# Patient Record
Sex: Male | Born: 1945 | Race: White | Hispanic: Yes | Marital: Married | Smoking: Never smoker
Health system: Southern US, Community
[De-identification: ages and names within clinical notes are randomized; demographics above are authoritative.]

## PROBLEM LIST (undated history)

## (undated) DIAGNOSIS — I2510 Atherosclerotic heart disease of native coronary artery without angina pectoris: Secondary | ICD-10-CM

## (undated) DIAGNOSIS — I4891 Unspecified atrial fibrillation: Secondary | ICD-10-CM

## (undated) DIAGNOSIS — H269 Unspecified cataract: Secondary | ICD-10-CM

## (undated) DIAGNOSIS — I10 Essential (primary) hypertension: Secondary | ICD-10-CM

## (undated) DIAGNOSIS — I499 Cardiac arrhythmia, unspecified: Secondary | ICD-10-CM

## (undated) DIAGNOSIS — I428 Other cardiomyopathies: Secondary | ICD-10-CM

## (undated) DIAGNOSIS — J90 Pleural effusion, not elsewhere classified: Secondary | ICD-10-CM

## (undated) DIAGNOSIS — Z531 Procedure and treatment not carried out because of patient's decision for reasons of belief and group pressure: Secondary | ICD-10-CM

## (undated) DIAGNOSIS — E119 Type 2 diabetes mellitus without complications: Secondary | ICD-10-CM

## (undated) DIAGNOSIS — IMO0001 Reserved for inherently not codable concepts without codable children: Secondary | ICD-10-CM

## (undated) DIAGNOSIS — M1710 Unilateral primary osteoarthritis, unspecified knee: Secondary | ICD-10-CM

## (undated) DIAGNOSIS — M1990 Unspecified osteoarthritis, unspecified site: Secondary | ICD-10-CM

## (undated) DIAGNOSIS — Z950 Presence of cardiac pacemaker: Secondary | ICD-10-CM

## (undated) DIAGNOSIS — E7800 Pure hypercholesterolemia, unspecified: Secondary | ICD-10-CM

## (undated) HISTORY — DX: Essential (primary) hypertension: I10

## (undated) HISTORY — DX: Unspecified atrial fibrillation: I48.91

## (undated) HISTORY — DX: Pleural effusion, not elsewhere classified: J90

## (undated) HISTORY — DX: Unspecified cataract: H26.9

## (undated) HISTORY — DX: Unilateral primary osteoarthritis, unspecified knee: M17.10

## (undated) HISTORY — DX: Atherosclerotic heart disease of native coronary artery without angina pectoris: I25.10

## (undated) HISTORY — PX: CATARACT EXTRACTION W/ INTRAOCULAR LENS IMPLANT: SHX1309

## (undated) HISTORY — PX: HERNIA REPAIR: SHX51

## (undated) HISTORY — DX: Other cardiomyopathies: I42.8

---

## 1984-08-18 HISTORY — PX: INCISE AND DRAIN ABCESS: PRO64

## 1999-08-19 DIAGNOSIS — E119 Type 2 diabetes mellitus without complications: Secondary | ICD-10-CM

## 1999-08-19 HISTORY — DX: Type 2 diabetes mellitus without complications: E11.9

## 2004-08-18 DIAGNOSIS — I10 Essential (primary) hypertension: Secondary | ICD-10-CM

## 2004-08-18 HISTORY — DX: Essential (primary) hypertension: I10

## 2005-07-25 ENCOUNTER — Ambulatory Visit: Payer: Self-pay | Admitting: Nurse Practitioner

## 2005-08-04 ENCOUNTER — Ambulatory Visit: Payer: Self-pay | Admitting: *Deleted

## 2005-08-08 ENCOUNTER — Ambulatory Visit: Payer: Self-pay | Admitting: Nurse Practitioner

## 2005-09-17 ENCOUNTER — Ambulatory Visit: Payer: Self-pay | Admitting: Nurse Practitioner

## 2005-09-30 ENCOUNTER — Ambulatory Visit: Payer: Self-pay | Admitting: Nurse Practitioner

## 2005-11-25 ENCOUNTER — Ambulatory Visit: Payer: Self-pay | Admitting: Nurse Practitioner

## 2005-12-09 ENCOUNTER — Ambulatory Visit: Payer: Self-pay | Admitting: Nurse Practitioner

## 2005-12-23 ENCOUNTER — Ambulatory Visit: Payer: Self-pay | Admitting: Nurse Practitioner

## 2006-01-05 ENCOUNTER — Ambulatory Visit: Payer: Self-pay | Admitting: Nurse Practitioner

## 2006-01-21 ENCOUNTER — Ambulatory Visit: Payer: Self-pay | Admitting: Nurse Practitioner

## 2006-03-06 ENCOUNTER — Ambulatory Visit: Payer: Self-pay | Admitting: Nurse Practitioner

## 2006-06-05 ENCOUNTER — Ambulatory Visit: Payer: Self-pay | Admitting: Nurse Practitioner

## 2006-12-30 ENCOUNTER — Ambulatory Visit: Payer: Self-pay | Admitting: Nurse Practitioner

## 2007-02-03 ENCOUNTER — Ambulatory Visit: Payer: Self-pay | Admitting: Internal Medicine

## 2007-05-05 ENCOUNTER — Encounter (INDEPENDENT_AMBULATORY_CARE_PROVIDER_SITE_OTHER): Payer: Self-pay | Admitting: *Deleted

## 2007-07-27 ENCOUNTER — Ambulatory Visit: Payer: Self-pay | Admitting: Internal Medicine

## 2007-09-16 ENCOUNTER — Ambulatory Visit: Payer: Self-pay | Admitting: Internal Medicine

## 2007-10-13 ENCOUNTER — Encounter (INDEPENDENT_AMBULATORY_CARE_PROVIDER_SITE_OTHER): Payer: Self-pay | Admitting: Nurse Practitioner

## 2007-10-13 ENCOUNTER — Ambulatory Visit: Payer: Self-pay | Admitting: Internal Medicine

## 2007-10-13 LAB — CONVERTED CEMR LAB
ALT: 15 units/L (ref 0–53)
CO2: 28 meq/L (ref 19–32)
Calcium: 9.9 mg/dL (ref 8.4–10.5)
Chloride: 100 meq/L (ref 96–112)
Glucose, Bld: 83 mg/dL (ref 70–99)
Sodium: 138 meq/L (ref 135–145)
Total Protein: 7.8 g/dL (ref 6.0–8.3)

## 2008-02-25 ENCOUNTER — Ambulatory Visit: Payer: Self-pay | Admitting: Internal Medicine

## 2008-04-14 ENCOUNTER — Ambulatory Visit: Payer: Self-pay | Admitting: Internal Medicine

## 2008-07-25 ENCOUNTER — Ambulatory Visit: Payer: Self-pay | Admitting: Internal Medicine

## 2008-07-25 ENCOUNTER — Encounter (INDEPENDENT_AMBULATORY_CARE_PROVIDER_SITE_OTHER): Payer: Self-pay | Admitting: Internal Medicine

## 2008-07-25 LAB — CONVERTED CEMR LAB
Calcium: 9.6 mg/dL (ref 8.4–10.5)
Cholesterol: 171 mg/dL (ref 0–200)
Creatinine, Ser: 0.99 mg/dL (ref 0.40–1.50)
HDL: 48 mg/dL (ref >39)
Microalb, Ur: 0.38 mg/dL (ref 0.00–1.89)
Triglycerides: 56 mg/dL (ref <150)

## 2008-08-25 ENCOUNTER — Encounter (INDEPENDENT_AMBULATORY_CARE_PROVIDER_SITE_OTHER): Payer: Self-pay | Admitting: Internal Medicine

## 2008-08-25 ENCOUNTER — Ambulatory Visit: Payer: Self-pay | Admitting: Internal Medicine

## 2008-08-25 LAB — CONVERTED CEMR LAB
BUN: 22 mg/dL (ref 6–23)
CO2: 26 meq/L (ref 19–32)
Chloride: 103 meq/L (ref 96–112)
Creatinine, Ser: 0.94 mg/dL (ref 0.40–1.50)
Glucose, Bld: 90 mg/dL (ref 70–99)

## 2008-11-10 ENCOUNTER — Ambulatory Visit: Payer: Self-pay | Admitting: Internal Medicine

## 2009-01-08 ENCOUNTER — Ambulatory Visit: Payer: Self-pay | Admitting: Internal Medicine

## 2009-02-12 ENCOUNTER — Ambulatory Visit: Payer: Self-pay | Admitting: Internal Medicine

## 2009-07-16 ENCOUNTER — Ambulatory Visit: Payer: Self-pay | Admitting: Internal Medicine

## 2009-07-16 ENCOUNTER — Encounter (INDEPENDENT_AMBULATORY_CARE_PROVIDER_SITE_OTHER): Payer: Self-pay | Admitting: Internal Medicine

## 2009-07-16 LAB — CONVERTED CEMR LAB
ALT: 23 units/L (ref 0–53)
AST: 20 units/L (ref 0–37)
Albumin: 4.7 g/dL (ref 3.5–5.2)
Alkaline Phosphatase: 59 units/L (ref 39–117)
BUN: 23 mg/dL (ref 6–23)
CO2: 24 meq/L (ref 19–32)
Calcium: 9.4 mg/dL (ref 8.4–10.5)
Chloride: 105 meq/L (ref 96–112)
Creatinine, Ser: 0.94 mg/dL (ref 0.40–1.50)
Glucose, Bld: 102 mg/dL — ABNORMAL HIGH (ref 70–99)
Potassium: 4.3 meq/L (ref 3.5–5.3)
Sodium: 140 meq/L (ref 135–145)
Total Bilirubin: 0.8 mg/dL (ref 0.3–1.2)
Total Protein: 7.3 g/dL (ref 6.0–8.3)

## 2009-08-21 ENCOUNTER — Ambulatory Visit: Payer: Self-pay | Admitting: Internal Medicine

## 2009-11-19 ENCOUNTER — Ambulatory Visit: Payer: Self-pay | Admitting: Internal Medicine

## 2009-11-19 LAB — CONVERTED CEMR LAB
CO2: 22 meq/L (ref 19–32)
Chloride: 102 meq/L (ref 96–112)
Creatinine, Ser: 0.79 mg/dL (ref 0.40–1.50)

## 2009-12-11 ENCOUNTER — Ambulatory Visit: Payer: Self-pay | Admitting: Internal Medicine

## 2010-04-11 ENCOUNTER — Ambulatory Visit: Payer: Self-pay | Admitting: Internal Medicine

## 2010-09-16 ENCOUNTER — Encounter (INDEPENDENT_AMBULATORY_CARE_PROVIDER_SITE_OTHER): Payer: Self-pay | Admitting: *Deleted

## 2010-09-16 ENCOUNTER — Ambulatory Visit: Admit: 2010-09-16 | Payer: Self-pay | Admitting: Internal Medicine

## 2010-09-16 LAB — CONVERTED CEMR LAB
Cholesterol: 143 mg/dL (ref 0–200)
Total CHOL/HDL Ratio: 3.7
VLDL: 12 mg/dL (ref 0–40)

## 2011-12-17 ENCOUNTER — Encounter (INDEPENDENT_AMBULATORY_CARE_PROVIDER_SITE_OTHER): Payer: Self-pay | Admitting: General Surgery

## 2011-12-19 ENCOUNTER — Ambulatory Visit (INDEPENDENT_AMBULATORY_CARE_PROVIDER_SITE_OTHER): Payer: PRIVATE HEALTH INSURANCE | Admitting: General Surgery

## 2011-12-19 ENCOUNTER — Encounter (INDEPENDENT_AMBULATORY_CARE_PROVIDER_SITE_OTHER): Payer: Self-pay | Admitting: General Surgery

## 2011-12-19 VITALS — BP 138/82 | HR 66 | Temp 97.9°F | Resp 14 | Ht 74.0 in | Wt 246.2 lb

## 2011-12-19 DIAGNOSIS — K4030 Unilateral inguinal hernia, with obstruction, without gangrene, not specified as recurrent: Secondary | ICD-10-CM

## 2011-12-19 DIAGNOSIS — K4090 Unilateral inguinal hernia, without obstruction or gangrene, not specified as recurrent: Secondary | ICD-10-CM | POA: Insufficient documentation

## 2011-12-19 DIAGNOSIS — K429 Umbilical hernia without obstruction or gangrene: Secondary | ICD-10-CM | POA: Insufficient documentation

## 2011-12-19 MED ORDER — TAMSULOSIN HCL 0.4 MG PO CAPS: 0.4000 mg | ORAL_CAPSULE | Freq: Every day | ORAL | Status: DC

## 2011-12-19 NOTE — Progress Notes (Signed)
Patient ID: Kyle Burke, male   DOB: August 12, 1946, 66 y.o.   MRN: [id number]  Chief Complaint  Patient presents with  . Inguinal Hernia    new pt- eval ing hernia    HPI Kyle Burke is a 66 y.o. male.  HPI 66 year old Hispanic male referred by Dr. Andrey Campanile for evaluation of an incarcerated left inguinal hernia. The patient states that he has had a bulge in his groin for about 6-7 months. It causes him minimal discomfort. When it does cause him discomfort it radiates into his left testicle. He denies any numbness or tingling. He denies any fever, chills, nausea, vomiting, diarrhea, or constipation. He denies any dysuria. He does have some nocturia. He denies any prior abdominal surgery. He denies any weight loss.  His son functioned as his Nurse, learning disability Past Medical History  Diagnosis Date  . Hypertension   . Diabetes mellitus     Past Surgical History  Procedure Date  . Incise and drain abcess 1986    on leg    Family History  Problem Relation Age of Onset  . Stroke Mother   . Heart disease Father     Social History History  Substance Use Topics  . Smoking status: Never Smoker   . Smokeless tobacco: Not on file  . Alcohol Use: Yes    No Known Allergies  Current Outpatient Prescriptions  Medication Sig Dispense Refill  . amLODipine (NORVASC) 5 MG tablet Take 5 mg by mouth daily.      Marland Kitchen lisinopril-hydrochlorothiazide (PRINZIDE,ZESTORETIC) 20-12.5 MG per tablet Take 1 tablet by mouth daily.      . metFORMIN (GLUCOPHAGE) 500 MG tablet Take 500 mg by mouth 2 (two) times daily with a meal.      . Tamsulosin HCl (FLOMAX) 0.4 MG CAPS Take 1 capsule (0.4 mg total) by mouth daily. Start taking 2 days before your hernia surgery  7 capsule  0    Review of Systems Review of Systems  Constitutional: Negative for fever, chills, appetite change and unexpected weight change.  HENT: Negative for congestion and trouble swallowing.   Eyes: Negative for visual disturbance.    Respiratory: Negative for chest tightness and shortness of breath.   Cardiovascular: Negative for chest pain and leg swelling.       No PND, no orthopnea, no DOE  Gastrointestinal:       See HPI  Genitourinary: Negative for dysuria and hematuria.       +nocturia  Musculoskeletal: Negative.   Skin: Negative for rash.  Neurological: Negative for seizures and speech difficulty.  Hematological: Does not bruise/bleed easily.  Psychiatric/Behavioral: Negative for behavioral problems and confusion.    Blood pressure 138/82, pulse 66, temperature 97.9 F (36.6 C), temperature source Temporal, resp. rate 14, height 6\' 2"  (1.88 m), weight 246 lb 3.2 oz (111.676 kg).  Physical Exam Physical Exam  Vitals reviewed. Constitutional: He is oriented to person, place, and time. He appears well-developed and well-nourished. No distress.  HENT:  Head: Normocephalic and atraumatic.  Right Ear: External ear normal.  Left Ear: External ear normal.  Eyes: Conjunctivae are normal. No scleral icterus.  Neck: Normal range of motion. Neck supple. No tracheal deviation present. No thyromegaly present.  Cardiovascular: Normal rate, regular rhythm, normal heart sounds and intact distal pulses.   Pulmonary/Chest: Effort normal and breath sounds normal. No respiratory distress. He has no wheezes.  Abdominal: Soft. Bowel sounds are normal. He exhibits no distension. There is no tenderness. There is no rebound. A hernia  is present. Hernia confirmed positive in the ventral area and confirmed positive in the left inguinal area.       1cm umbilical hernia, reducible  Genitourinary: Testes normal.          Large left incarcerated nontender scrotal/inguinal hernia  Musculoskeletal: Normal range of motion. He exhibits no edema and no tenderness.  Lymphadenopathy:    He has no cervical adenopathy.  Neurological: He is alert and oriented to person, place, and time. He exhibits normal muscle tone.  Skin: Skin is warm  and dry. No rash noted. He is not diaphoretic. No erythema.  Psychiatric: He has a normal mood and affect. His behavior is normal. Thought content normal.    Data Reviewed [facility] note  Assessment    Umbilical hernia Large left incarcerated (nontender) inguinal hernia    Plan    We discussed the etiology of umbilical and inguinal hernias. We discussed the signs & symptoms of incarceration & strangulation.  We discussed non-operative and operative management. Since He has both an umbilical and inguinal hernia I recommended a laparoscopic approach  The patient has elected to proceed with LAPAROSCOPIC LEFT INGUINAL HERNIA REPAIR WITH MESH, UMBILICAL HERNIA REPAIR WITH POSSIBLE MESH    I described the procedure in detail.  The patient was given educational material. We discussed the risks and benefits including but not limited to bleeding, infection, chronic inguinal pain, nerve entrapment, hernia recurrence, mesh complications, hematoma formation, urinary retention, injury to the testicles or the ovaries, numbness in the groin, blood clots, injury to the surrounding structures, and anesthesia risk. We also discussed the typical post operative recovery course, including no heavy lifting for 4-6 weeks. I explained that the likelihood of improvement of their symptoms is good.  I explained that he is at slightly higher risk of hematoma and nerve injury given the size of his hernia.   Kyle Burke. Andrey Campanile, MD, FACS General, Bariatric, & Minimally Invasive Surgery [facility], [place]         [facility] M 12/19/2011, 10:32 AM

## 2011-12-19 NOTE — Patient Instructions (Signed)
Hernia, reparacin con laparoscopa (Hernia Repair with Laparoscope) Una hernia ocurre cuando un rgano interno protruye a travs de un punto dbil de los msculos de la pared muscular abdominal (del vientre). Se producen con mayor frecuencia en la ingle y alrededor del ombligo. Tambin pueden producirse a travs de una incisin (corte realizado por el cirujano) despus de una operacin abdominal. Las causas de hernia son:  Levantar objetos pesados.   Tos prolongada.   Hacer fuerza para mover el intestino.  Generalmente puede volver a colocarse en su lugar (reducirse). La mayor parte de las hernias tienden a empeorar con el tiempo. Se producen algunos problemas cuando alguna parte del contenido abdominal se atasca en la abertura y el flujo de sangre se bloquea o se ve dificultado (hernia encarcelada). Debido a estos riesgos, es necesario someterse a una ciruga para reparar la hernia. La hernia se reparar utilizando un laparoscopio La ciruga laparoscpica es un tipo de ciruga mnimamente invasora. No implica ningn corte quirrgico (incisin) en la piel. El laparoscopio es un dispositivo similar a un telescopio que utiliza varillas y lentes. Se conecta a una cmara de vdeo y a una fuente de luz, con la que el profesional puede ver claramente la zona de la operacin. El instrumento se inserta por una abertura en la piel de  to  inch (5 mm or 10 mm), en ciertos puntos especficos. Se crea un espacio de visualizacin y manipulacin, insuflando una pequea cantidad de dixido de carbono en la cavidad abdominal. El abdomen es inflado (insuflado) como un globo. Esto eleve la pared abdominal por arriba de los rganos internos como si fuera una bveda. El dixido de carbono es un gas presente en el organismo humano y puede ser absorbido por los tejidos y eliminado a travs del sistema respiratorio. Una vez que se ha llevado a cabo la reparacin, las pequeas incisiones se cerrarn, ya sea con suturas  (puntos) o grapas (igual a las que se usan para el papel, pero aqu se trata de mantener los bordes de la piel unidos). INFORME AL PROFESIONAL SOBRE LOS SIGUIENTES PUNTOS:  Alergias.   Medicamentos que toma, incluyendo hierbas, gotas oftlmicas, medicamentos de venta libre y cremas.   Uso de esteroides (por va oral o cremas).   Problemas anteriores debido a anestsicos o a la [place].   Posibilidad de embarazo, si correspondiera.   Antecedentes de haber tenido cogulos sanguneos (tromboflebitis).   Antecedentes de hemorragias o problemas sanguneos.   Cirugas previas.   Otros problemas de salud.  ANTES DEL PROCEDIMIENTO La laparoscopa podr llevarse a cabo tanto en el hospital como en un consultorio. Podrn administrarle sedante suave que lo ayudar a relajarse antes y durante el procedimiento. Una vez en la sala de operaciones, le darn una anestesia general que lo har dormir (a menos que usted y el profesional elijan otro tipo de anestesia).  DESPUS DEL PROCEDIMIENTO Luego del procedimiento ser llevado a una sala de recuperacin. Segn el tipo de hernia que se haya reparado, deber quedarse en el hospital o se le permitir volver a casa el mismo da. Con este procedimiento sentir menos dolor y habr una menor cicatrizacin. Esto por lo general resulta en una recuperacin ms rpida y un menor riesgo de infeccin. INSTRUCCIONES PARA EL CUIDADO DOMICILIARIO  No es necesario hacer reposo en cama. Puede continuar con sus actividades normales pero evite levantar objetos pesados (ms de 5 kg) y el estreimiento.   Tosa con suavidad. Si es fumador, es mejor abandonar el hbito, ya   que an despus de reparada, la hernia puede volver a aparecer con la tensin continua de la tos.   Evite conducir hasta que el cirujano lo autorice.   No hay restricciones en la dieta, a menos que le indiquen otra cosa.   TOME TODOS LOS MEDICAMENTOS TAL COMO SE LE INDIC.   Utilice los medicamentos  de venta libre o de prescripcin para el dolor, el malestar o la fiebre, segn se lo indique el profesional que lo asiste.  SOLICITE ATENCIN MDICA SI:  Aumenta el dolor abdominal o en las incisiones.   La hemorragia en las incisiones es ms que una pequea mancha.   Si se siente confundido o muy dbil.   Siente escalofros o la temperatura oral se eleva por encima de 102 F (38.9 C) en forma inexplicable.   Presenta enrojecimiento, hinchazn o aumento del dolor en la herida.   Aparece pus en la herida.   Un olor ftido proviene de la herida o del vendaje.  SOLICITE ATENCIN MDICA DE INMEDIATO SI:  Aparece una erupcin cutnea.   Presenta dificultades respiratorias.   Tiene algn problema alrgico.  EST SEGURO QUE:   Comprende las instrucciones para el alta mdica.   Controlar su enfermedad.   Solicitar atencin mdica de inmediato segn las indicaciones.  Document Released: 08/04/2005 Document Revised: 07/24/2011 [employer] Patient Information 2012 [employer]. 

## 2012-01-13 ENCOUNTER — Encounter (HOSPITAL_COMMUNITY)
Admission: RE | Admit: 2012-01-13 | Discharge: 2012-01-13 | Disposition: A | Discharge status: Home / Self Care | Payer: Self-pay | Source: Ambulatory Visit | Attending: General Surgery | Admitting: General Surgery

## 2012-01-13 ENCOUNTER — Ambulatory Visit (HOSPITAL_COMMUNITY)
Admission: RE | Admit: 2012-01-13 | Discharge: 2012-01-13 | Disposition: A | Discharge status: Home / Self Care | Payer: Self-pay | Source: Ambulatory Visit | Attending: General Surgery | Admitting: General Surgery

## 2012-01-13 ENCOUNTER — Encounter (HOSPITAL_COMMUNITY): Payer: Self-pay

## 2012-01-13 ENCOUNTER — Encounter (HOSPITAL_COMMUNITY): Payer: Self-pay | Admitting: Pharmacy Technician

## 2012-01-13 DIAGNOSIS — Z01812 Encounter for preprocedural laboratory examination: Secondary | ICD-10-CM | POA: Insufficient documentation

## 2012-01-13 DIAGNOSIS — K4090 Unilateral inguinal hernia, without obstruction or gangrene, not specified as recurrent: Secondary | ICD-10-CM | POA: Insufficient documentation

## 2012-01-13 DIAGNOSIS — Z01818 Encounter for other preprocedural examination: Secondary | ICD-10-CM | POA: Insufficient documentation

## 2012-01-13 LAB — SURGICAL PCR SCREEN: MRSA, PCR: NEGATIVE

## 2012-01-13 LAB — DIFFERENTIAL
Basophils Absolute: 0 10*3/uL (ref 0.0–0.1)
Basophils Relative: 0 % (ref 0–1)
Eosinophils Absolute: 0.1 10*3/uL (ref 0.0–0.7)
Eosinophils Relative: 1 % (ref 0–5)
Monocytes Absolute: 0.5 10*3/uL (ref 0.1–1.0)
Neutro Abs: 4.2 10*3/uL (ref 1.7–7.7)

## 2012-01-13 LAB — BASIC METABOLIC PANEL
BUN: 23 mg/dL (ref 6–23)
Chloride: 102 mEq/L (ref 96–112)
Creatinine, Ser: 0.91 mg/dL (ref 0.50–1.35)
GFR calc Af Amer: >90 mL/min (ref >90)

## 2012-01-13 LAB — CBC
HCT: 39.6 % (ref 39.0–52.0)
MCHC: 33.6 g/dL (ref 30.0–36.0)
MCV: 89.4 fL (ref 78.0–100.0)
RDW: 13.4 % (ref 11.5–15.5)

## 2012-01-13 NOTE — Patient Instructions (Addendum)
21 Kyle Burke  01/13/2012   Your procedure is scheduled on:  5-31 -2013  Report to [facility] at    0715    AM.  Call this number if you have problems the morning of surgery: [phone]   Remember:   Do not eat food:After Midnight.    Take these medicines the morning of surgery with A SIP OF WATER: Amlodipine, Do not take any Metformin AM of Surgery.   Do not wear jewelry, make-up or nail polish.  Do not wear lotions, powders, or perfumes. You may wear deodorant.  Do not shave 48 hours prior to surgery.(face and neck okay, no shaving of legs)  Do not bring valuables to the hospital.  Contacts, dentures or bridgework may not be worn into surgery.  Leave suitcase in the car. After surgery it may be brought to your room.  For patients admitted to the hospital, checkout time is 11:00 AM the day of discharge.   Patients discharged the day of surgery will not be allowed to drive home.  Name and phone number of your driver: Son -[name]  Special Instructions: CHG Shower Use Special Wash: 1/2 bottle night before surgery and 1/2 bottle morning of surgery.(avoid face and genitals)   Please read over the following fact sheets that you were given: MRSA Information.

## 2012-01-13 NOTE — Pre-Procedure Instructions (Signed)
01-13-12 EKG/CXR done today.

## 2012-01-16 ENCOUNTER — Encounter (HOSPITAL_COMMUNITY): Payer: Self-pay | Admitting: Anesthesiology

## 2012-01-16 ENCOUNTER — Encounter (HOSPITAL_COMMUNITY)
Admission: RE | Disposition: A | Discharge status: Home / Self Care | Payer: Self-pay | Source: Ambulatory Visit | Attending: General Surgery

## 2012-01-16 ENCOUNTER — Ambulatory Visit (HOSPITAL_COMMUNITY)
Admission: RE | Admit: 2012-01-16 | Discharge: 2012-01-16 | Disposition: A | Discharge status: Home / Self Care | Payer: Self-pay | Source: Ambulatory Visit | Attending: General Surgery | Admitting: General Surgery

## 2012-01-16 ENCOUNTER — Ambulatory Visit (HOSPITAL_COMMUNITY): Payer: Self-pay | Admitting: Anesthesiology

## 2012-01-16 ENCOUNTER — Encounter (HOSPITAL_COMMUNITY): Payer: Self-pay | Admitting: *Deleted

## 2012-01-16 DIAGNOSIS — I10 Essential (primary) hypertension: Secondary | ICD-10-CM | POA: Insufficient documentation

## 2012-01-16 DIAGNOSIS — K429 Umbilical hernia without obstruction or gangrene: Secondary | ICD-10-CM | POA: Insufficient documentation

## 2012-01-16 DIAGNOSIS — K4030 Unilateral inguinal hernia, with obstruction, without gangrene, not specified as recurrent: Secondary | ICD-10-CM

## 2012-01-16 DIAGNOSIS — Z79899 Other long term (current) drug therapy: Secondary | ICD-10-CM | POA: Insufficient documentation

## 2012-01-16 DIAGNOSIS — E119 Type 2 diabetes mellitus without complications: Secondary | ICD-10-CM | POA: Insufficient documentation

## 2012-01-16 HISTORY — PX: INGUINAL HERNIA REPAIR: SHX194

## 2012-01-16 HISTORY — PX: UMBILICAL HERNIA REPAIR: SHX196

## 2012-01-16 LAB — GLUCOSE, CAPILLARY: Glucose-Capillary: 126 mg/dL — ABNORMAL HIGH (ref 70–99)

## 2012-01-16 SURGERY — REPAIR, HERNIA, INGUINAL, LAPAROSCOPIC
Anesthesia: General | Wound class: Clean

## 2012-01-16 MED ORDER — PROPOFOL 10 MG/ML IV BOLUS
INTRAVENOUS | Status: DC | PRN
  Administered 2012-01-16: 200 mg via INTRAVENOUS

## 2012-01-16 MED ORDER — BUPIVACAINE-EPINEPHRINE 0.25% -1:200000 IJ SOLN
INTRAMUSCULAR | Status: DC | PRN
  Administered 2012-01-16: 30 mL

## 2012-01-16 MED ORDER — OXYCODONE-ACETAMINOPHEN 5-325 MG PO TABS: 1.0000 | ORAL_TABLET | ORAL | Status: AC | PRN

## 2012-01-16 MED ORDER — MIDAZOLAM HCL 5 MG/5ML IJ SOLN
INTRAMUSCULAR | Status: DC | PRN
  Administered 2012-01-16: 2 mg via INTRAVENOUS

## 2012-01-16 MED ORDER — LIDOCAINE HCL (CARDIAC) 20 MG/ML IV SOLN
INTRAVENOUS | Status: DC | PRN
  Administered 2012-01-16: 100 mg via INTRAVENOUS

## 2012-01-16 MED ORDER — HYDROMORPHONE HCL PF 1 MG/ML IJ SOLN
INTRAMUSCULAR | Status: AC
  Filled 2012-01-16: qty 1

## 2012-01-16 MED ORDER — CHLORHEXIDINE GLUCONATE 4 % EX LIQD
1.0000 "application " | Freq: Once | CUTANEOUS | Status: DC
  Filled 2012-01-16: qty 15

## 2012-01-16 MED ORDER — FENTANYL CITRATE 0.05 MG/ML IJ SOLN
INTRAMUSCULAR | Status: DC | PRN
  Administered 2012-01-16 (×2): 50 ug via INTRAVENOUS
  Administered 2012-01-16: 100 ug via INTRAVENOUS
  Administered 2012-01-16: 50 ug via INTRAVENOUS

## 2012-01-16 MED ORDER — LACTATED RINGERS IV SOLN
INTRAVENOUS | Status: DC
  Administered 2012-01-16 (×2): 1000 mL via INTRAVENOUS

## 2012-01-16 MED ORDER — ACETAMINOPHEN 10 MG/ML IV SOLN
INTRAVENOUS | Status: DC | PRN
  Administered 2012-01-16: 1000 mg via INTRAVENOUS

## 2012-01-16 MED ORDER — CEFAZOLIN SODIUM-DEXTROSE 2-3 GM-% IV SOLR
INTRAVENOUS | Status: AC
  Filled 2012-01-16: qty 50

## 2012-01-16 MED ORDER — EPHEDRINE SULFATE 50 MG/ML IJ SOLN
INTRAMUSCULAR | Status: DC | PRN
  Administered 2012-01-16: 5 mg via INTRAVENOUS

## 2012-01-16 MED ORDER — BUPIVACAINE-EPINEPHRINE PF 0.25-1:200000 % IJ SOLN
INTRAMUSCULAR | Status: AC
  Filled 2012-01-16: qty 30

## 2012-01-16 MED ORDER — ACETAMINOPHEN 10 MG/ML IV SOLN
INTRAVENOUS | Status: AC
  Filled 2012-01-16: qty 100

## 2012-01-16 MED ORDER — LACTATED RINGERS IR SOLN
Status: DC | PRN
  Administered 2012-01-16: 1

## 2012-01-16 MED ORDER — PROMETHAZINE HCL 25 MG/ML IJ SOLN: 6.2500 mg | INTRAMUSCULAR | Status: DC | PRN

## 2012-01-16 MED ORDER — CEFAZOLIN SODIUM-DEXTROSE 2-3 GM-% IV SOLR
2.0000 g | INTRAVENOUS | Status: AC
  Administered 2012-01-16: 2 g via INTRAVENOUS

## 2012-01-16 MED ORDER — HYDROMORPHONE HCL PF 1 MG/ML IJ SOLN
0.2500 mg | INTRAMUSCULAR | Status: DC | PRN
  Administered 2012-01-16 (×2): 0.5 mg via INTRAVENOUS

## 2012-01-16 MED ORDER — 0.9 % SODIUM CHLORIDE (POUR BTL) OPTIME
TOPICAL | Status: DC | PRN
  Administered 2012-01-16: 1000 mL

## 2012-01-16 MED ORDER — NEOSTIGMINE METHYLSULFATE 1 MG/ML IJ SOLN
INTRAMUSCULAR | Status: DC | PRN
  Administered 2012-01-16: 4 mg via INTRAVENOUS

## 2012-01-16 MED ORDER — ONDANSETRON HCL 4 MG/2ML IJ SOLN
INTRAMUSCULAR | Status: DC | PRN
  Administered 2012-01-16: 4 mg via INTRAVENOUS

## 2012-01-16 MED ORDER — GLYCOPYRROLATE 0.2 MG/ML IJ SOLN
INTRAMUSCULAR | Status: DC | PRN
  Administered 2012-01-16: .8 mg via INTRAVENOUS

## 2012-01-16 MED ORDER — ROCURONIUM BROMIDE 100 MG/10ML IV SOLN
INTRAVENOUS | Status: DC | PRN
  Administered 2012-01-16: 10 mg via INTRAVENOUS
  Administered 2012-01-16: 50 mg via INTRAVENOUS
  Administered 2012-01-16 (×3): 10 mg via INTRAVENOUS

## 2012-01-16 SURGICAL SUPPLY — 61 items
ADH SKN CLS APL DERMABOND .7 (GAUZE/BANDAGES/DRESSINGS) ×2
APL SKNCLS STERI-STRIP NONHPOA (GAUZE/BANDAGES/DRESSINGS)
BANDAGE ADHESIVE 1X3 (GAUZE/BANDAGES/DRESSINGS) IMPLANT
BENZOIN TINCTURE PRP APPL 2/3 (GAUZE/BANDAGES/DRESSINGS) IMPLANT
BLADE HEX COATED 2.75 (ELECTRODE) ×1 IMPLANT
BLADE SURG 15 STRL LF DISP TIS (BLADE) IMPLANT
BLADE SURG 15 STRL SS (BLADE)
BLADE SURG SZ10 CARB STEEL (BLADE) ×1 IMPLANT
CANISTER SUCTION 2500CC (MISCELLANEOUS) ×3 IMPLANT
CHLORAPREP W/TINT 26ML (MISCELLANEOUS) ×2 IMPLANT
CLOTH BEACON ORANGE TIMEOUT ST (SAFETY) ×3 IMPLANT
DECANTER SPIKE VIAL GLASS SM (MISCELLANEOUS) ×2 IMPLANT
DERMABOND ADVANCED (GAUZE/BANDAGES/DRESSINGS) ×1
DERMABOND ADVANCED .7 DNX12 (GAUZE/BANDAGES/DRESSINGS) ×1 IMPLANT
DEVICE SECURE STRAP 25 ABSORB (INSTRUMENTS) ×3 IMPLANT
DRAIN PENROSE 18X1/2 LTX STRL (DRAIN) ×1 IMPLANT
DRAPE LAPAROSCOPIC ABDOMINAL (DRAPES) ×3 IMPLANT
DRAPE UTILITY XL STRL (DRAPES) ×3 IMPLANT
DRSG TEGADERM 2-3/8X2-3/4 SM (GAUZE/BANDAGES/DRESSINGS) IMPLANT
DRSG TEGADERM 4X4.75 (GAUZE/BANDAGES/DRESSINGS) IMPLANT
ELECT REM PT RETURN 9FT ADLT (ELECTROSURGICAL) ×3
ELECTRODE REM PT RTRN 9FT ADLT (ELECTROSURGICAL) ×2 IMPLANT
GLOVE BIO SURGEON STRL SZ7 (GLOVE) ×3 IMPLANT
GLOVE BIO SURGEON STRL SZ7.5 (GLOVE) ×3 IMPLANT
GLOVE BIOGEL M STRL SZ7.5 (GLOVE) IMPLANT
GLOVE BIOGEL PI IND STRL 7.0 (GLOVE) ×2 IMPLANT
GLOVE BIOGEL PI INDICATOR 7.0 (GLOVE) ×1
GLOVE INDICATOR 8.0 STRL GRN (GLOVE) ×4 IMPLANT
GOWN STRL NON-REIN LRG LVL3 (GOWN DISPOSABLE) ×1 IMPLANT
GOWN STRL REIN XL XLG (GOWN DISPOSABLE) ×5 IMPLANT
KIT BASIN OR (CUSTOM PROCEDURE TRAY) ×3 IMPLANT
MESH 3DMAX 4X6 LT LRG (Mesh General) ×1 IMPLANT
NDL HYPO 25X1 1.5 SAFETY (NEEDLE) ×1 IMPLANT
NEEDLE HYPO 25X1 1.5 SAFETY (NEEDLE) IMPLANT
NS IRRIG 1000ML POUR BTL (IV SOLUTION) ×3 IMPLANT
PACK BASIC VI WITH GOWN DISP (CUSTOM PROCEDURE TRAY) ×3 IMPLANT
PENCIL BUTTON HOLSTER BLD 10FT (ELECTRODE) ×3 IMPLANT
SCISSORS LAP 5X35 DISP (ENDOMECHANICALS) ×1 IMPLANT
SET IRRIG TUBING LAPAROSCOPIC (IRRIGATION / IRRIGATOR) ×2 IMPLANT
SHEARS CURVED HARMONIC AC 45CM (MISCELLANEOUS) IMPLANT
SOLUTION ANTI FOG 6CC (MISCELLANEOUS) ×3 IMPLANT
SPONGE GAUZE 4X4 12PLY (GAUZE/BANDAGES/DRESSINGS) IMPLANT
SPONGE LAP 18X18 X RAY DECT (DISPOSABLE) ×3 IMPLANT
STRIP CLOSURE SKIN 1/2X4 (GAUZE/BANDAGES/DRESSINGS) IMPLANT
SUT MNCRL AB 4-0 PS2 18 (SUTURE) ×3 IMPLANT
SUT NOVA NAB GS-21 0 18 T12 DT (SUTURE) ×1 IMPLANT
SUT SILK 2 0 SH (SUTURE) IMPLANT
SUT VIC AB 2-0 SH 27 (SUTURE)
SUT VIC AB 2-0 SH 27X BRD (SUTURE) ×4 IMPLANT
SUT VIC AB 3-0 SH 18 (SUTURE) ×1 IMPLANT
SUT VIC AB 3-0 SH 27 (SUTURE)
SUT VIC AB 3-0 SH 27XBRD (SUTURE) ×1 IMPLANT
SYR BULB IRRIGATION 50ML (SYRINGE) ×2 IMPLANT
SYR CONTROL 10ML LL (SYRINGE) ×2 IMPLANT
TOWEL OR 17X26 10 PK STRL BLUE (TOWEL DISPOSABLE) ×2 IMPLANT
TRAY FOLEY CATH 14FRSI W/METER (CATHETERS) ×1 IMPLANT
TRAY LAP CHOLE (CUSTOM PROCEDURE TRAY) ×3 IMPLANT
TROCAR BLADELESS OPT 5 75 (ENDOMECHANICALS) ×6 IMPLANT
TROCAR XCEL BLUNT TIP 100MML (ENDOMECHANICALS) ×3 IMPLANT
TUBING INSUFFLATION 10FT LAP (TUBING) ×3 IMPLANT
YANKAUER SUCT BULB TIP 10FT TU (MISCELLANEOUS) ×1 IMPLANT

## 2012-01-16 NOTE — Progress Notes (Signed)
Interpreter in to help with translation and assessment of pt; pt comfortable at present

## 2012-01-16 NOTE — Anesthesia Postprocedure Evaluation (Signed)
  Anesthesia Post-op Note  Patient: Kyle Burke  Procedure(s) Performed: Procedure(s) (LRB): LAPAROSCOPIC INGUINAL HERNIA (Left) HERNIA REPAIR UMBILICAL ADULT (N/A) INSERTION OF MESH (N/A)  Patient Location: PACU  Anesthesia Type: General  Level of Consciousness: awake and alert   Airway and Oxygen Therapy: Patient Spontanous Breathing  Post-op Pain: mild  Post-op Assessment: Post-op Vital signs reviewed, Patient's Cardiovascular Status Stable, Respiratory Function Stable, Patent Airway and No signs of Nausea or vomiting  Post-op Vital Signs: stable  Complications: No apparent anesthesia complications

## 2012-01-16 NOTE — Discharge Instructions (Signed)
[facility]  UMBILICAL OR INGUINAL HERNIA REPAIR: POST OP INSTRUCTIONS  Always review your discharge instruction sheet given to you by the facility where your surgery was performed. IF YOU HAVE DISABILITY OR FAMILY LEAVE FORMS, YOU MUST BRING THEM TO THE OFFICE FOR PROCESSING.   DO NOT GIVE THEM TO YOUR DOCTOR.  1. A  prescription for pain medication may be given to you upon discharge.  Take your pain medication as prescribed, if needed.  If narcotic pain medicine is not needed, then you may take acetaminophen (Tylenol) or ibuprofen (Advil) as needed. 2. Take your usually prescribed medications unless otherwise directed. 3. If you need a refill on your pain medication, please contact your pharmacy.  They will contact our office to request authorization. Prescriptions will not be filled after 5 pm or on week-ends. 4. You should follow a light diet the first 24 hours after arrival home, such as soup and crackers, etc.  Be sure to include lots of fluids daily.  Resume your normal diet the day after surgery. 5. Most patients will experience some swelling and bruising around the umbilicus or in the groin and scrotum.  Ice packs and reclining will help.  Swelling and bruising can take several days to resolve.  6. It is common to experience some constipation if taking pain medication after surgery.  Increasing fluid intake and taking a stool softener (such as Colace) will usually help or prevent this problem from occurring.  A mild laxative (Milk of Magnesia or Miralax) should be taken according to package directions if there are no bowel movements after 48 hours. 7. Unless discharge instructions indicate otherwise, you may remove your bandages 24 hours after surgery, and you may shower at that time.  You have skin glue on the incision, you may shower in 24 hours.  The glue will flake off over the next 2-3 weeks.  8. ACTIVITIES:  You may resume regular (light) daily activities beginning  the next day--such as daily self-care, walking, climbing stairs--gradually increasing activities as tolerated.  You may have sexual intercourse when it is comfortable.  Refrain from any heavy lifting or straining until approved by your doctor. a. You may drive when you are no longer taking prescription pain medication, you can comfortably wear a seatbelt, and you can safely maneuver your car and apply brakes. b. RETURN TO WORK: 9. You should see your doctor in the office for a follow-up appointment approximately 2-3 weeks after your surgery.  Make sure that you call for this appointment within a day or two after you arrive home to insure a convenient appointment time. 10. OTHER INSTRUCTIONS: DO NOT PUSH, PULL, OR LIFT ANYTHING GREATER THAN 15 POUNDS FOR 6 WEEKS    WHEN TO CALL YOUR DOCTOR: 1. Fever over 101.0 2. Inability to urinate 3. Nausea and/or vomiting 4. Extreme swelling or bruising 5. Continued bleeding from incision. 6. Increased pain, redness, or drainage from the incision  The clinic staff is available to answer your questions during regular business hours.  Please don't hesitate to call and ask to speak to one of the nurses for clinical concerns.  If you have a medical emergency, go to the nearest emergency room or call 911.  A surgeon from [facility] is always on call at the hospital   [address], [place], [place]  [address] ?  [address] [address], [place], [place]   [address] [phone] ? [phone] ? FAX [phone] Web site:  [email or web address]   Reparacin quirrgica de la hernia Cuidados posteriores a la ciruga (Hernia, Surgical Repair, Care After) Lea detenidamente las siguientes indicaciones. Sgalas durante las prximas semanas. Estas indicaciones le proporcionan informacin general acerca de cmo deber cuidarse despus de la ciruga. El profesional que la asiste podr darle instrucciones especficas. Aunque el tratamiento  se ha planificado de acuerdo con las prcticas mdicas disponibles ms recientes, ocasionalmente pueden ocurrir complicaciones inevitables. Si tiene problemas o surgen preguntas luego de recibir el alta, por favor comunquese con su mdico. INSTRUCCIONES PARA EL CUIDADO DOMICILIARIO  Es normal que se Arts administrator un par de semanas luego de la [place]. Consulte con el profesional que lo asiste si parece Consulting civil engineer de mejorar.   Aplique hielo en el lugar de la operacin.   Ponga el hielo en una bolsa plstica.   Coloque una toalla entre la piel y la bolsa de hielo.   Deje el hielo durante 15 a 20 minutos por vez, 3 a 4 veces por da, durante los [employer].   Cambie el vendaje tal como se le indic.   Mantenga la herida limpia y seca. Puede lavar suavemente la herida con agua y [place]. Seque la herida con suaves toques. No tome baos, no utilice piscinas ni baeras durante [address], o segn las indicaciones del mdico.   Solo tome medicamentos de venta libre o recetados para Chief Technology Officer, Environmental health practitioner o la [place], segn le haya indicado el mdico.   Retome su dieta normal cuando le indiquen.   No conduzca vehculos hasta que le indiquen lo contrario.   No levante objetos que pesen ms de 4 kg ni realice deportes de contacto durante 6 semanas, o segn las indicaciones.   Concierte una cita con el profesional que lo asiste para Oceanographer los puntos o las grapas, para la fecha en que le han indicado.  SOLICITE ATENCIN MDICA SI:  Observa un aumento de la hemorragia de las heridas.   Maxie Better en la materia fecal.   Presenta enrojecimiento, hinchazn o aumento del dolor en la herida.   Observa pus en la zona de la herida.   La temperatura se eleva por encima de 102 F (38.9 C).   Advierte un olor ftido que proviene de la herida o del vendaje.   Se siente mareado o se desmaya.  SOLICITE ATENCIN MDICA DE INMEDIATO SI:  Aparece una erupcin cutnea.    Presenta dificultades respiratorias.   Desarrolla alguna reaccin o efecto secundario por los medicamentos administrados.  ASEGRESE QUE:  Comprende estas instrucciones.   Controlar su enfermedad.   Solicitar ayuda inmediatamente si no mejora o si empeora.  Document Released: 11/11/2007 Document Revised: 07/24/2011 [facility] Patient Information 2012 [place], [place].

## 2012-01-16 NOTE — H&P (View-Only) (Signed)
Patient ID: Kyle Burke, male   DOB: 03/29/1946, 65 y.o.   MRN: [id number]  Chief Complaint  Patient presents with  . Inguinal Hernia    new pt- eval ing hernia    HPI Kyle Burke is a 65 y.o. male.  HPI 65-year-old Hispanic male referred by Dr. Danelle Curiale for evaluation of an incarcerated left inguinal hernia. The patient states that he has had a bulge in his groin for about 6-7 months. It causes him minimal discomfort. When it does cause him discomfort it radiates into his left testicle. He denies any numbness or tingling. He denies any fever, chills, nausea, vomiting, diarrhea, or constipation. He denies any dysuria. He does have some nocturia. He denies any prior abdominal surgery. He denies any weight loss.  His son functioned as his translator Past Medical History  Diagnosis Date  . Hypertension   . Diabetes mellitus     Past Surgical History  Procedure Date  . Incise and drain abcess 1986    on leg    Family History  Problem Relation Age of Onset  . Stroke Mother   . Heart disease Father     Social History History  Substance Use Topics  . Smoking status: Never Smoker   . Smokeless tobacco: Not on file  . Alcohol Use: Yes    No Known Allergies  Current Outpatient Prescriptions  Medication Sig Dispense Refill  . amLODipine (NORVASC) 5 MG tablet Take 5 mg by mouth daily.      . lisinopril-hydrochlorothiazide (PRINZIDE,ZESTORETIC) 20-12.5 MG per tablet Take 1 tablet by mouth daily.      . metFORMIN (GLUCOPHAGE) 500 MG tablet Take 500 mg by mouth 2 (two) times daily with a meal.      . Tamsulosin HCl (FLOMAX) 0.4 MG CAPS Take 1 capsule (0.4 mg total) by mouth daily. Start taking 2 days before your hernia surgery  7 capsule  0    Review of Systems Review of Systems  Constitutional: Negative for fever, chills, appetite change and unexpected weight change.  HENT: Negative for congestion and trouble swallowing.   Eyes: Negative for visual disturbance.    Respiratory: Negative for chest tightness and shortness of breath.   Cardiovascular: Negative for chest pain and leg swelling.       No PND, no orthopnea, no DOE  Gastrointestinal:       See HPI  Genitourinary: Negative for dysuria and hematuria.       +nocturia  Musculoskeletal: Negative.   Skin: Negative for rash.  Neurological: Negative for seizures and speech difficulty.  Hematological: Does not bruise/bleed easily.  Psychiatric/Behavioral: Negative for behavioral problems and confusion.    Blood pressure 138/82, pulse 66, temperature 97.9 F (36.6 C), temperature source Temporal, resp. rate 14, height 6' 2" (1.88 m), weight 246 lb 3.2 oz (111.676 kg).  Physical Exam Physical Exam  Vitals reviewed. Constitutional: He is oriented to person, place, and time. He appears well-developed and well-nourished. No distress.  HENT:  Head: Normocephalic and atraumatic.  Right Ear: External ear normal.  Left Ear: External ear normal.  Eyes: Conjunctivae are normal. No scleral icterus.  Neck: Normal range of motion. Neck supple. No tracheal deviation present. No thyromegaly present.  Cardiovascular: Normal rate, regular rhythm, normal heart sounds and intact distal pulses.   Pulmonary/Chest: Effort normal and breath sounds normal. No respiratory distress. He has no wheezes.  Abdominal: Soft. Bowel sounds are normal. He exhibits no distension. There is no tenderness. There is no rebound. A hernia   is present. Hernia confirmed positive in the ventral area and confirmed positive in the left inguinal area.       1cm umbilical hernia, reducible  Genitourinary: Testes normal.          Large left incarcerated nontender scrotal/inguinal hernia  Musculoskeletal: Normal range of motion. He exhibits no edema and no tenderness.  Lymphadenopathy:    He has no cervical adenopathy.  Neurological: He is alert and oriented to person, place, and time. He exhibits normal muscle tone.  Skin: Skin is warm  and dry. No rash noted. He is not diaphoretic. No erythema.  Psychiatric: He has a normal mood and affect. His behavior is normal. Thought content normal.    Data Reviewed [facility] note  Assessment    Umbilical hernia Large left incarcerated (nontender) inguinal hernia    Plan    We discussed the etiology of umbilical and inguinal hernias. We discussed the signs & symptoms of incarceration & strangulation.  We discussed non-operative and operative management. Since He has both an umbilical and inguinal hernia I recommended a laparoscopic approach  The patient has elected to proceed with LAPAROSCOPIC LEFT INGUINAL HERNIA REPAIR WITH MESH, UMBILICAL HERNIA REPAIR WITH POSSIBLE MESH    I described the procedure in detail.  The patient was given educational material. We discussed the risks and benefits including but not limited to bleeding, infection, chronic inguinal pain, nerve entrapment, hernia recurrence, mesh complications, hematoma formation, urinary retention, injury to the testicles or the ovaries, numbness in the groin, blood clots, injury to the surrounding structures, and anesthesia risk. We also discussed the typical post operative recovery course, including no heavy lifting for 4-6 weeks. I explained that the likelihood of improvement of their symptoms is good.  I explained that he is at slightly higher risk of hematoma and nerve injury given the size of his hernia.   Eknoor Novack M. Mearle Drew, MD, FACS General, Bariatric, & Minimally Invasive Surgery [facility]         Merri [name] M 12/19/2011, 10:32 AM    

## 2012-01-16 NOTE — Anesthesia Preprocedure Evaluation (Addendum)
Anesthesia Evaluation  Patient identified by MRN, date of birth, ID band Patient awake    Reviewed: Allergy & Precautions, H&P , NPO status , Patient's Chart, lab work & pertinent test results  Airway       Dental   Pulmonary neg pulmonary ROS,  CXR: reviewed report.  NAD.         Cardiovascular Exercise Tolerance: Good hypertension, Pt. on medications  ECG: SR with PAC's. 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   Neuro/Psych negative neurological ROS  negative psych ROS   GI/Hepatic negative GI ROS, Neg liver ROS,   Endo/Other  Diabetes mellitus-, Type 2, Oral Hypoglycemic Agents  Renal/GU negative Renal ROS  negative genitourinary   Musculoskeletal negative musculoskeletal ROS (+)   Abdominal (+) + obese,   Peds negative pediatric ROS (+)  Hematology negative hematology ROS (+)   Anesthesia Other Findings   Reproductive/Obstetrics negative OB ROS                        Anesthesia Physical Anesthesia Plan  ASA: III  Anesthesia Plan: General   Post-op Pain Management:    Induction: Intravenous  Airway Management Planned: Oral ETT  Additional Equipment:   Intra-op Plan:   Post-operative Plan: Extubation in OR  Informed Consent: I have reviewed the patients History and Physical, chart, labs and discussed the procedure including the risks, benefits and alternatives for the proposed anesthesia with the patient or authorized representative who has indicated his/her understanding and acceptance.   Dental advisory given  Plan Discussed with: CRNA  Anesthesia Plan Comments: (Language barrier. Speaks only spanish. R/B discussed through interpreter Christella Scheuermann. Questions answered.)       Anesthesia Quick Evaluation

## 2012-01-16 NOTE — Op Note (Signed)
01/16/2012  Josh Palacio [birth year]   PREOPERATIVE DIAGNOSIS:  1. Left incarcerated inguinal hernia.  2. Umbilical hernia  POSTOPERATIVE DIAGNOSIS:  1. Left incarcerated indirect inguinal hernia.  2. Umbilical hernia  PROCEDURE:  1. Laparoscopic repair of incarcerated left indirect inguinal hernia with  mesh (TAPP).  2. Open primary umbilical hernia repair  SURGEON: Mary Sella. Andrey Campanile, MD   ASSISTANT SURGEON: None.   ANESTHESIA: General plus local consisting of 0.25% Marcaine with epi.   ESTIMATED BLOOD LOSS: Minimal.   FINDINGS: The patient had a incarcerated left indirect inguinal hernia.  It was repaired using a 4.3 inch x 6.3 inch piece of Bard 3DMax mesh. Umbilical fascial defect was 1 cm.   SPECIMEN: none  INDICATIONS FOR PROCEDURE: 66 year old Hispanic male referred by Dr. Andrey Campanile for evaluation of an incarcerated left inguinal hernia. The patient states that he has had a bulge in his groin for about 6-7 months. It causes him minimal discomfort. When it does cause him discomfort it radiates into his left testicle.  The patient also has about 1cm umbilical hernia. He desired surgical repair for both hernias. The risks and benefits including but not limited to bleeding, infection, chronic inguinal pain, nerve entrapment, hernia recurrence, mesh complications, hematoma formation, urinary retention, injury to the testicles, numbness in the groin, blood clots, injury to the surrounding structures, and anesthesia risk was discussed with the patient.  DESCRIPTION OF PROCEDURE: After obtaining verbal consent and marking  the left groin in the holding area with the patient confirming the  operative site, the patient was then taken back to the operating room, placed  supine on the operating room table. General endotracheal anesthesia was  established. The patient had emptied their bladder prior to going back to  the operating room. Sequential compression devices were placed. The    abdomen and groin were prepped and draped in the usual standard surgical  fashion with ChloraPrep. The patient received IV Tylenol as well as IV  antibiotics prior to the incision. A surgical time-out was performed.  Local was infiltrated at the base of the umbilicus.   Next, a 4-cm transverse infraumbilical incision was made with a #11 blade after infiltrating the skin and subcutaneous tissue with 0.250% Marcaine with epinephrine.  Dissection was carried down to the hernia sac located above the fascia and was mobilized from surrounding structures. Intact fascia was identified circumferentially around the defect. Skin and soft tissue was mobilized from the surface of the fascia in a circumferential manner. A finger sweep was performed underneath the fascia to ensure there were no adhesions to the anterior abdominal wall. A 0-vicryl was placed on each side of the defect and a 12-mm Hasson trocar was placed.  Pneumoperitoneum was smoothly established up to a patient pressure of 15  mmHg. Laparoscope was advanced. There was no evidence of a  contralateral hernia. The patient had a defect lateral to  the inferior epigastric vessel, consistent with an left indirect  hernia. There was sigmoid colon in the hernia defect. Two 5-mm trocars were placed, one on the right, one on the left  in the midclavicular line slightly above the level of the umbilicus all  under direct visualization. The bowel was then reduced using atraumatic laparoscopic graspers. The bowel was viable and healthy. The patient had a large indirect hernia. After local had been infiltrated, I then  made incision along the peritoneum on the left, starting 2 inches above  the anterior superior iliac spine and caring it medial  toward  the median umbilical ligament in a lazy S configuration using  Endo Shears with electrocautery. The peritoneal flap was then gently  dissected downward from the anterior abdominal wall taking care not to   injure the inferior epigastric vessels. The pubic bone was identified.  The testicular vessels were identified.  Using  traction and counter traction with short graspers, I reduced the sac in  its entirety. This took about 25 minutes to do given the large size of the hernia sac. I did decrease the intra-abdominal pressure to help facilitate reducing the sac. The testicular vessels had been identified and preserved. The vas deferens was identified and preserved, and the hernia sac was stripped from those to  surrounding structures. I then went about creating a large pocket by  lifting the peritoneum of the pelvic floor. I took great care not to  injure the iliac vessels. The sac was reduced in its entirety.    I then obtained a piece of large left Bard 3DMax mesh 4.3 inch x  6.3 inch, placed it through the Hasson trocar, half of it covered medial  to the inferior epigastric vessels and half of it lateral to the  inferior epigastric vessels. The defect was well  covered with the mesh. I then secured the mesh to the abdominal wall  using an Ethicon secure strap tack. Tacks were placed through  the Cooper's ligament, one tack on each side of the inferior epigastric  vessel and two tacks out laterally. No tacks were placed below the  shelving edge of the inguinal ligament. Pneumoperitoneum was reduced  to 8 mmHg. I then brought the peritoneal flap back up to the abdominal  wall and tacked it to the abdominal wall using 4 tacks. There was no  defect in the peritoneum, and the mesh was well covered. I removed the  Hasson trocar. The fascia was then closed primarily in a transverse fashion with 3 interrupted 0-novafil sutures. The cavity was irrigated and additional local was infiltrated in the subcutaneous tissue and fascia. The umbilical stalk was then tacked back down to the fascia with two 3-0 vicryl sutures. Hemostasis was confirmed. The soft tissue was irrigated and closed in layers with inverted  interrupted 3-0 vicryl sutures for the deep dermis.  The closure was viewed laparoscopically. There was no evidence of  fascial defect. There was no air leak at the umbilicus. There was no  evidence of injury to surrounding structures. Pneumoperitoneum was  released, and the remaining trocars were removed. All skin incisions  were closed with a 4-0 Monocryl in a subcuticular fashion followed by  application of Dermabond. All needle, instrument, and sponge counts  were correct x2. There are no immediate complications. The patient  tolerated the procedure well. The patient was extubated and taken to the  recovery room in stable condition.  Mary Sella. Andrey Campanile, MD, FACS General, Bariatric, & Minimally Invasive Surgery [facility], [place]

## 2012-01-16 NOTE — Interval H&P Note (Signed)
History and Physical Interval Note:  01/16/2012 9:29 AM  Kyle Burke  has presented today for surgery, with the diagnosis of left inguinal hernia , umbilical hernia  The various methods of treatment have been discussed with the patient and family. After consideration of risks, benefits and other options for treatment, the patient has consented to  Procedure(s) (LRB): LAPAROSCOPIC INGUINAL HERNIA (Left) HERNIA REPAIR UMBILICAL ADULT (N/A) INSERTION OF MESH (N/A) as a surgical intervention .  The patients' history has been reviewed, patient examined, no change in status, stable for surgery.  I have reviewed the patients' chart and labs.  Questions were answered to the patient's satisfaction.    Mary Sella. Andrey Campanile, MD, FACS General, Bariatric, & Minimally Invasive Surgery [facility], [place]  [facility]

## 2012-01-16 NOTE — Progress Notes (Signed)
Patient states he took a pill wed and Thursday that the doctor told him to take does not know what it was for or name of medication.

## 2012-01-16 NOTE — Transfer of Care (Signed)
Immediate Anesthesia Transfer of Care Note  Patient: Kyle Burke  Procedure(s) Performed: Procedure(s) (LRB): LAPAROSCOPIC INGUINAL HERNIA (Left) HERNIA REPAIR UMBILICAL ADULT (N/A) INSERTION OF MESH (N/A)  Patient Location: PACU  Anesthesia Type: General  Level of Consciousness: awake, alert  and oriented  Airway & Oxygen Therapy: Patient Spontanous Breathing and Patient connected to face mask oxygen  Post-op Assessment: Report given to PACU RN and Post -op Vital signs reviewed and stable  Post vital signs: Reviewed and stable  Complications: No apparent anesthesia complications

## 2012-01-19 ENCOUNTER — Encounter (HOSPITAL_COMMUNITY): Payer: Self-pay | Admitting: General Surgery

## 2012-02-11 ENCOUNTER — Encounter (INDEPENDENT_AMBULATORY_CARE_PROVIDER_SITE_OTHER): Payer: Self-pay | Admitting: General Surgery

## 2012-02-11 ENCOUNTER — Ambulatory Visit (INDEPENDENT_AMBULATORY_CARE_PROVIDER_SITE_OTHER): Payer: Self-pay | Admitting: General Surgery

## 2012-02-11 VITALS — BP 140/100 | HR 88 | Temp 97.1°F | Resp 16 | Ht 75.0 in | Wt 252.0 lb

## 2012-02-11 DIAGNOSIS — IMO0002 Reserved for concepts with insufficient information to code with codable children: Secondary | ICD-10-CM

## 2012-02-11 NOTE — Patient Instructions (Addendum)
The swelling will go down in time.  Avoid lifting, pushing, or pulling anything greater than 15 pounds for another 2 weeks.

## 2012-02-11 NOTE — Progress Notes (Signed)
Subjective:     Patient ID: Kyle Burke, male   DOB: 12/21/1945, 66 y.o.   MRN: [id number]  HPI 66 year old Hispanic male comes in today for his first postoperative appointment. He underwent a laparoscopic repair of a large left indirect inguinal hernia with mesh which was incarcerated as well as open primary repair of umbilical hernia on May 31. He is accompanied by his son who acts as his Nurse, learning disability. He denies any fevers or chills. He denies any nausea or vomiting. He denies any diarrhea or constipation. He denies any trouble urinating. He reports a good appetite. He denies any inguinal numbness or tingling. He denies any inguinal pain. He does state that he still has some inflammation in his groin area.  Review of Systems     Objective:   Physical Exam BP 140/100  Pulse 88  Temp 97.1 F (36.2 C) (Temporal)  Resp 16  Ht 6\' 3"  (1.905 m)  Wt 252 lb (114.306 kg)  BMI 31.50 kg/m2  Gen: alert, NAD, non-toxic appearing Abd: soft, nontender, nondistended. Well-healed trocar sites. No cellulitis. No incisional hernia; large inguinal/scrotal solid mass c/w hematoma GU: both testicles descended,  no sign of hernia recurrence Ext: no edema     Assessment:     S/p Laparoscopic repair of large left incarcerated indirect inguinal hernia with mesh & open primary repair of umbilical hernia 01/16/12    Plan:     It appears he has developed a large inguinal/scrotal hematoma. He is asymptomatic with respect to it. We will follow it for now. This should resolve with time. He was reminded not to do any heavy lifting or strenuous activity for another 2 weeks. Followup 2 months  Mary Sella. Andrey Campanile, MD, FACS General, Bariatric, & Minimally Invasive Surgery [facility], [place]

## 2012-04-07 ENCOUNTER — Ambulatory Visit (INDEPENDENT_AMBULATORY_CARE_PROVIDER_SITE_OTHER): Payer: PRIVATE HEALTH INSURANCE | Admitting: General Surgery

## 2012-04-07 ENCOUNTER — Encounter (INDEPENDENT_AMBULATORY_CARE_PROVIDER_SITE_OTHER): Payer: Self-pay | Admitting: General Surgery

## 2012-04-07 VITALS — BP 132/70 | HR 80 | Temp 97.6°F | Resp 16 | Ht 75.0 in | Wt 247.4 lb

## 2012-04-07 DIAGNOSIS — Z09 Encounter for follow-up examination after completed treatment for conditions other than malignant neoplasm: Secondary | ICD-10-CM

## 2012-04-07 NOTE — Progress Notes (Signed)
Subjective:     Patient ID: Kyle Burke, male   DOB: 09-24-1945, 66 y.o.   MRN: [id number]  HPI 66 year old Hispanic male comes in for followup. He underwent a laparoscopic repair of a very large left indirect inguinal hernia with mesh and umbilical hernia repair on May 31. He had developed a very significant postoperative groin hematoma. He denies any fever, chills, nausea, vomiting, diarrhea or constipation. He denies any inguinal pain. He denies any inguinal numbness or tingling. He states the area has gotten a lot smaller since his last visit.  Review of Systems     Objective:   Physical Exam BP 132/70  Pulse 80  Temp 97.6 F (36.4 C) (Temporal)  Resp 16  Ht 6\' 3"  (1.905 m)  Wt 247 lb 6.4 oz (112.22 kg)  BMI 30.92 kg/m2 Alert, nad Soft, nt, nd Much smaller Left inguinal hematoma- size of golf ball now. Both testicles down and symmetric. No evidence of hernia recurrence    Assessment:     Status post laparoscopic repair of left inguinal hernia with mesh with improving postoperative hematoma    Plan:     His hematoma has dramatically gone down in size since his last visit. I advised him it should continue to get smaller with time. He was released to full duties. Followup as needed or if area gets larger. The interview and exam was conducted with the aid of the interpreter  Mary Sella. Andrey Campanile, MD, FACS General, Bariatric, & Minimally Invasive Surgery [facility], [place]

## 2014-05-30 LAB — HM DIABETES EYE EXAM

## 2014-08-18 DIAGNOSIS — I4891 Unspecified atrial fibrillation: Secondary | ICD-10-CM

## 2014-08-18 HISTORY — DX: Unspecified atrial fibrillation: I48.91

## 2014-09-06 ENCOUNTER — Encounter: Payer: Self-pay | Admitting: Cardiovascular Disease

## 2014-09-06 ENCOUNTER — Ambulatory Visit (INDEPENDENT_AMBULATORY_CARE_PROVIDER_SITE_OTHER): Payer: Self-pay | Admitting: Cardiovascular Disease

## 2014-09-06 VITALS — BP 144/82 | HR 106 | Ht 75.0 in | Wt 260.8 lb

## 2014-09-06 DIAGNOSIS — I481 Persistent atrial fibrillation: Secondary | ICD-10-CM

## 2014-09-06 DIAGNOSIS — I4891 Unspecified atrial fibrillation: Secondary | ICD-10-CM | POA: Insufficient documentation

## 2014-09-06 DIAGNOSIS — I4819 Other persistent atrial fibrillation: Secondary | ICD-10-CM

## 2014-09-06 DIAGNOSIS — I447 Left bundle-branch block, unspecified: Secondary | ICD-10-CM

## 2014-09-06 DIAGNOSIS — I10 Essential (primary) hypertension: Secondary | ICD-10-CM

## 2014-09-06 NOTE — Progress Notes (Signed)
Cardiology Office Note   Date:  09/06/2014   ID:  Kyle Burke, Kyle Burke Apr 10, 1946, MRN [id number]  PCP:  Kyle Sax, MD  Cardiologist:   Kyle Headings, MD   Chief Complaint  Patient presents with  . Atrial Fibrillation      History of Present Illness: Kyle Burke is a 69 y.o. male orginially from [place], who presents for evaluation of newly diagnosed atrial fib.   The patient speaks some english.  No intrepreter was available.  Was an urgent work in from [employer] - Dr. Amil Burke. He presented to Dr. Mahala Burke with palpitations in his left chest.  Was found to have a-fib and was referred to [place].  No CP , no dyspnea.  Works in Loss adjuster, chartered.  Non smoker Drinks beer occasionally    Past Medical History  Diagnosis Date  . Hypertension   . Diabetes mellitus     Past Surgical History  Procedure Laterality Date  . Incise and drain abcess  1986    on leg  . Inguinal hernia repair  01/16/2012    Procedure: LAPAROSCOPIC INGUINAL HERNIA;  Surgeon: Kyle Curry, MD,FACS;  Location: [facility];  Service: General;  Laterality: Left;  Laparoscopic incarcerated Left inguinal hernia repair with mesh, umbilical hernia repair  . Umbilical hernia repair  01/16/2012    Procedure: HERNIA REPAIR UMBILICAL ADULT;  Surgeon: Kyle Curry, MD,FACS;  Location: [facility];  Service: General;  Laterality: N/A;     Current Outpatient Prescriptions  Medication Sig Dispense Refill  . amLODipine (NORVASC) 5 MG tablet Take 5 mg by mouth daily with breakfast.     . lisinopril-hydrochlorothiazide (PRINZIDE,ZESTORETIC) 20-12.5 MG per tablet Take 1 tablet by mouth daily with breakfast.     . metFORMIN (GLUCOPHAGE) 500 MG tablet Take 500 mg by mouth 2 (two) times daily with a meal.    . OVER THE COUNTER MEDICATION Take 1 tablet by mouth 3 (three) times daily. Herbal Supplement. Patient takes 1 tablet by mouth three times a day.     No current facility-administered medications for this  visit.    Allergies:   Review of patient's allergies indicates no known allergies.    Social History:  The patient  reports that he has never smoked. He does not have any smokeless tobacco history on file. He reports that he drinks about 0.6 oz of alcohol per week. He reports that he does not use illicit drugs.   Family History:  The patient's family history includes Heart disease in his father; Stroke in his mother.    ROS:  Please see the history of present illness.   Otherwise, review of systems are positive for none.   All other systems are reviewed and negative.    PHYSICAL EXAM: VS:  BP 144/82 mmHg  Pulse 106  Ht 6\' 3"  (1.905 m)  Wt 260 lb 12.8 oz (118.298 kg)  BMI 32.60 kg/m2 , BMI Body mass index is 32.6 kg/(m^2). GEN: Well nourished, well developed, in no acute distress HEENT: normal Neck: no JVD, carotid bruits, or masses Cardiac: Irreg. Irreg. ; soft systolic  Murmur. No , rubs, or gallops,no edema  Respiratory:  clear to auscultation bilaterally, normal work of breathing GI: soft, nontender, nondistended, + BS MS: no deformity or atrophy Skin: warm and dry, no rash Neuro:  Strength and sensation are intact Psych: euthymic mood, full affect   EKG:  EKG is ordered today. The ekg ordered today demonstrates atrial fib with V rate of  109   Recent Labs: No results found for requested labs within last 365 days.    Lipid Panel    Component Value Date/Time   CHOL 143 09/16/2010 2031   TRIG 60 09/16/2010 2031   HDL 39* 09/16/2010 2031   CHOLHDL 3.7 Ratio 09/16/2010 2031   VLDL 12 09/16/2010 2031   LDLCALC 92 09/16/2010 2031      Wt Readings from Last 3 Encounters:  09/06/14 260 lb 12.8 oz (118.298 kg)  04/07/12 247 lb 6.4 oz (112.22 kg)  02/11/12 252 lb (114.306 kg)      Other studies Reviewed: Additional studies/ records that were reviewed today include: records from Kyle Burke. Review of the above records demonstrates: atrial fib    ASSESSMENT  AND PLAN:  1.  Atrial fib with RVR.   He will need to be started on Metoprolol 25 mg BID. Will also give samples for Xarelto 20 mg a day . Will get an echo to assess his LV function and LV size.    I was able to speak to his son who helped with intrepretation . Will try to make sure that he can get the Xarelto or have his INR levels measured. Will call him tomorrow with further instruction    Current medicines are reviewed at length with the patient today.  The patient does not have concerns regarding medicines.  The following changes have been made:  no change  Labs/ tests ordered today include:  No orders of the defined types were placed in this encounter.     Disposition:   FU with me  in 1 month Will try to find an easy way to get him xarelto or get his INR levels checked.   Signed, Kyle Burke, Kyle Cheng, MD  09/06/2014 4:41 PM    [facility] [address], [place], [place]  [address] Phone: [phone]; Fax: [phone]

## 2014-09-06 NOTE — Patient Instructions (Addendum)
Your physician has recommended you make the following change in your medication:  START Metoprolol 25 mg twice daily START Xarelto 20 mg once daily with evening meal We will call your pharmacy tomorrow (Thursday 1/21)   Your physician recommends that you schedule a follow-up appointment in: 1 month with Dr. Acie Fredrickson

## 2014-09-07 ENCOUNTER — Telehealth: Payer: Self-pay | Admitting: Nurse Practitioner

## 2014-09-07 DIAGNOSIS — I4819 Other persistent atrial fibrillation: Secondary | ICD-10-CM

## 2014-09-07 MED ORDER — METOPROLOL TARTRATE 25 MG PO TABS: 25.0000 mg | ORAL_TABLET | Freq: Two times a day (BID) | ORAL | Status: DC

## 2014-09-07 NOTE — Telephone Encounter (Signed)
I called Dr. Amil Amen and told her that patient is scheduled for echo on 1/29 at 2 pm at [facility] so that she may notify the patient.  I advised her of instructions regarding patient getting lab work the same day and to come by office for written orders for lab.  I advised her to encourage patient to wait to come by until Wednesday or Thursday in hopes that we would have more Xarelto samples for the patient.  She verbalized understanding and gratitude.

## 2014-09-07 NOTE — Telephone Encounter (Signed)
Spoke with Dr. Amil Amen who referred patient for yesterday's office visit with Dr. Acie Fredrickson.  She states patient is not taking Norvasc 5 mg due to leg edema; states patient is taking Diltiazem CD 240 mg once daily - this has been added to patient's med list.  Dr. Amil Amen would like to know if Dr. Acie Fredrickson wants patient to still take Metoprolol 25 mg twice daily. She advised that she can get the patient assistance for taking Xarelto and would like to know if we have any more samples, as the assistance program will take approximately 6 weeks to process.  I advised her that there are no other samples available at this time, but that I can let her know if some come into the office. She states she will work on getting patient's echocardiogram done at [facility] through an assistance program there; I advised her I am ordering in epic now.  I advised that I will call her back regarding whether Dr. Acie Fredrickson wants patient to start Metoprolol in addition to the Diltiazem that he is already taking.  She verbalized understanding and agreement.

## 2014-09-07 NOTE — Telephone Encounter (Signed)
I called Dr. Amil Amen and advised that Dr. Acie Fredrickson does want patient to take Metoprolol 25 mg BID.  She advised that patient would like to get echocardiogram and lab work done at [facility].  I spoke with Montine Circle, lab supervisor at [facility], who advised that patient will need to register in admitting  and will need written lab orders and that he can get lab work done on the same day as the echocardiogram.   I am sending request to [facility] for scheduling of echo and will notify patient of need to pick up orders.

## 2014-09-13 NOTE — Telephone Encounter (Signed)
Xarelto samples placed at front desk for patient to pick up

## 2014-09-15 ENCOUNTER — Other Ambulatory Visit (HOSPITAL_COMMUNITY)
Admission: AD | Admit: 2014-09-15 | Discharge: 2014-09-15 | Disposition: A | Discharge status: Home / Self Care | Payer: Self-pay | Source: Ambulatory Visit | Attending: Cardiovascular Disease | Admitting: Cardiovascular Disease

## 2014-09-15 ENCOUNTER — Ambulatory Visit (HOSPITAL_COMMUNITY)
Admission: RE | Admit: 2014-09-15 | Discharge: 2014-09-15 | Disposition: A | Discharge status: Home / Self Care | Payer: Self-pay | Source: Ambulatory Visit | Attending: Cardiovascular Disease | Admitting: Cardiovascular Disease

## 2014-09-15 DIAGNOSIS — I4819 Other persistent atrial fibrillation: Secondary | ICD-10-CM

## 2014-09-15 DIAGNOSIS — I10 Essential (primary) hypertension: Secondary | ICD-10-CM | POA: Insufficient documentation

## 2014-09-15 DIAGNOSIS — I4891 Unspecified atrial fibrillation: Secondary | ICD-10-CM | POA: Insufficient documentation

## 2014-09-15 LAB — HEPATIC FUNCTION PANEL
ALK PHOS: 81 U/L (ref 39–117)
ALT: 149 U/L — AB (ref 0–53)
AST: 62 U/L — AB (ref 0–37)
Albumin: 3.8 g/dL (ref 3.5–5.2)
BILIRUBIN DIRECT: 0.2 mg/dL (ref 0.0–0.5)
BILIRUBIN TOTAL: 1 mg/dL (ref 0.3–1.2)
Indirect Bilirubin: 0.8 mg/dL (ref 0.3–0.9)
Total Protein: 6.6 g/dL (ref 6.0–8.3)

## 2014-09-15 LAB — LIPID PANEL
CHOLESTEROL: 155 mg/dL (ref 0–200)
HDL: 39 mg/dL — ABNORMAL LOW (ref >39)
LDL CALC: 104 mg/dL — AB (ref 0–99)
Total CHOL/HDL Ratio: 4 RATIO
Triglycerides: 58 mg/dL (ref <150)
VLDL: 12 mg/dL (ref 0–40)

## 2014-09-15 LAB — CBC WITH DIFFERENTIAL/PLATELET
BASOS ABS: 0 10*3/uL (ref 0.0–0.1)
Basophils Relative: 1 % (ref 0–1)
EOS ABS: 0.1 10*3/uL (ref 0.0–0.7)
EOS PCT: 2 % (ref 0–5)
HEMATOCRIT: 38.4 % — AB (ref 39.0–52.0)
HEMOGLOBIN: 12.7 g/dL — AB (ref 13.0–17.0)
Lymphocytes Relative: 25 % (ref 12–46)
Lymphs Abs: 1.2 10*3/uL (ref 0.7–4.0)
MCH: 29.4 pg (ref 26.0–34.0)
MCHC: 33.1 g/dL (ref 30.0–36.0)
MCV: 88.9 fL (ref 78.0–100.0)
Monocytes Absolute: 0.4 10*3/uL (ref 0.1–1.0)
Monocytes Relative: 9 % (ref 3–12)
NEUTROS PCT: 63 % (ref 43–77)
Neutro Abs: 3 10*3/uL (ref 1.7–7.7)
Platelets: 246 10*3/uL (ref 150–400)
RBC: 4.32 MIL/uL (ref 4.22–5.81)
RDW: 13.6 % (ref 11.5–15.5)
WBC: 4.6 10*3/uL (ref 4.0–10.5)

## 2014-09-15 LAB — TSH: TSH: 1.473 u[IU]/mL (ref 0.350–4.500)

## 2014-09-15 LAB — BASIC METABOLIC PANEL
Anion gap: 3 — ABNORMAL LOW (ref 5–15)
BUN: 17 mg/dL (ref 6–23)
CALCIUM: 9.4 mg/dL (ref 8.4–10.5)
CHLORIDE: 106 mmol/L (ref 96–112)
CO2: 31 mmol/L (ref 19–32)
CREATININE: 1.04 mg/dL (ref 0.50–1.35)
GFR calc non Af Amer: 72 mL/min — ABNORMAL LOW (ref >90)
GFR, EST AFRICAN AMERICAN: 84 mL/min — AB (ref >90)
GLUCOSE: 111 mg/dL — AB (ref 70–99)
POTASSIUM: 4 mmol/L (ref 3.5–5.1)
Sodium: 140 mmol/L (ref 135–145)

## 2014-09-15 NOTE — Progress Notes (Signed)
*  PRELIMINARY RESULTS* Echocardiogram 2D Echocardiogram has been performed.  Leavy Cella 09/15/2014, 3:04 PM

## 2014-09-18 ENCOUNTER — Telehealth: Payer: Self-pay | Admitting: Nurse Practitioner

## 2014-09-18 NOTE — Telephone Encounter (Signed)
Left message on patient's voice mail advising him of need to see Dr. Acie Fredrickson this week for follow-up of echo results. I also called Dr. Amil Amen and advised her of Dr. Elmarie Shiley advice and she states she will also try to contact patient to make certain he is aware of appointment

## 2014-09-20 ENCOUNTER — Encounter: Payer: Self-pay | Admitting: Cardiovascular Disease

## 2014-09-20 ENCOUNTER — Ambulatory Visit (INDEPENDENT_AMBULATORY_CARE_PROVIDER_SITE_OTHER): Payer: No Typology Code available for payment source | Admitting: Cardiovascular Disease

## 2014-09-20 VITALS — BP 114/74 | HR 56 | Ht 75.0 in | Wt 256.1 lb

## 2014-09-20 DIAGNOSIS — I481 Persistent atrial fibrillation: Secondary | ICD-10-CM

## 2014-09-20 DIAGNOSIS — I4819 Other persistent atrial fibrillation: Secondary | ICD-10-CM

## 2014-09-20 MED ORDER — METOPROLOL TARTRATE 50 MG PO TABS: 50.0000 mg | ORAL_TABLET | Freq: Two times a day (BID) | ORAL | Status: DC

## 2014-09-20 NOTE — Patient Instructions (Addendum)
Your physician has recommended you make the following change in your medication:  INCREASE Metoprolol to 50 mg twice daily STOP Dilitiazem  Your physician recommends that you schedule a follow-up appointment in: 4 weeks with Dr. Acie Fredrickson

## 2014-09-20 NOTE — Progress Notes (Signed)
Cardiology Office Note   Date:  09/20/2014   ID:  Oluwadamilare, Tobler February 18, 1946, MRN [id number]  PCP:  [name], MD  Cardiologist:   Thayer Headings, MD   Chief Complaint  Patient presents with  . Follow-up    chf      History of Present Illness: Kyle Burke is a 69 y.o. male orginially from [place], who presents for evaluation of newly diagnosed atrial fib.   The patient speaks some english.  No intrepreter was available.  Was an urgent work in from [employer] - Dr. Amil Amen. He presented to Dr. Mahala Menghini with palpitations in his left chest.  Was found to have a-fib and was referred to [place].  No CP , no dyspnea.  Works in Loss adjuster, chartered.  Non smoker Drinks beer occasionally   Feb. 3, 2015:  Jaleel is feeling better.  Is seen with intrepreter , [name].   We performed an echocardiogram which reveals severe left ventricle systolic dysfunction. His ejection fraction is 25-30%. He was referred to [place] several weeks ago for evaluatino of A-fib with RVR We started him on Xarelto, metooprolol,  He had some slight dizziness when he first started the metoprolol but this is largely resolved. He remains on diltiazem.  He is still at work . Breathing is much better.      Past Medical History  Diagnosis Date  . Hypertension   . Diabetes mellitus     Past Surgical History  Procedure Laterality Date  . Incise and drain abcess  1986    on leg  . Inguinal hernia repair  01/16/2012    Procedure: LAPAROSCOPIC INGUINAL HERNIA;  Surgeon: Gayland Curry, MD,FACS;  Location: [facility];  Service: General;  Laterality: Left;  Laparoscopic incarcerated Left inguinal hernia repair with mesh, umbilical hernia repair  . Umbilical hernia repair  01/16/2012    Procedure: HERNIA REPAIR UMBILICAL ADULT;  Surgeon: Gayland Curry, MD,FACS;  Location: [facility];  Service: General;  Laterality: N/A;     Current Outpatient Prescriptions  Medication Sig Dispense Refill  . diltiazem  (CARDIZEM CD) 240 MG 24 hr capsule Take 240 mg by mouth daily.    Marland Kitchen lisinopril-hydrochlorothiazide (PRINZIDE,ZESTORETIC) 20-12.5 MG per tablet Take 1 tablet by mouth daily with breakfast.     . metFORMIN (GLUCOPHAGE) 500 MG tablet Take 500 mg by mouth 2 (two) times daily with a meal.    . metoprolol tartrate (LOPRESSOR) 25 MG tablet Take 1 tablet (25 mg total) by mouth 2 (two) times daily. 180 tablet 3  . rivaroxaban (XARELTO) 20 MG TABS tablet Take 20 mg by mouth daily with supper.     No current facility-administered medications for this visit.    Allergies:   Review of patient's allergies indicates no known allergies.    Social History:  The patient  reports that he has never smoked. He does not have any smokeless tobacco history on file. He reports that he drinks about 0.6 oz of alcohol per week. He reports that he does not use illicit drugs.   Family History:  The patient's family history includes Heart disease in his father; Stroke in his mother.    ROS:  Please see the history of present illness.   Otherwise, review of systems are positive for none.   All other systems are reviewed and negative.    PHYSICAL EXAM: VS:  BP 114/74 mmHg  Pulse 56  Ht 6\' 3"  (1.905 m)  Wt 256 lb 1.9 oz (  116.175 kg)  BMI 32.01 kg/m2 , BMI Body mass index is 32.01 kg/(m^2). GEN: Well nourished, well developed, in no acute distress HEENT: normal Neck: no JVD, carotid bruits, or masses Cardiac: Irreg. Irreg. ; soft systolic  Murmur. No , rubs, or gallops,no edema  Respiratory:  clear to auscultation bilaterally, normal work of breathing GI: soft, nontender, nondistended, + BS MS: no deformity or atrophy Skin: warm and dry, no rash Neuro:  Strength and sensation are intact Psych: euthymic mood, full affect   EKG:  EKG is not ordered today.    Recent Labs: 09/15/2014: ALT 149*; BUN 17; Creatinine 1.04; Hemoglobin 12.7*; Platelets 246; Potassium 4.0; Sodium 140; TSH 1.473    Lipid Panel      Component Value Date/Time   CHOL 155 09/15/2014 1037   TRIG 58 09/15/2014 1037   HDL 39* 09/15/2014 1037   CHOLHDL 4.0 09/15/2014 1037   VLDL 12 09/15/2014 1037   LDLCALC 104* 09/15/2014 1037      Wt Readings from Last 3 Encounters:  09/20/14 256 lb 1.9 oz (116.175 kg)  09/06/14 260 lb 12.8 oz (118.298 kg)  04/07/12 247 lb 6.4 oz (112.22 kg)      Other studies Reviewed: Additional studies/ records that were reviewed today include: records from Dr. Tawni Carnes. Review of the above records demonstrates: atrial fib    ASSESSMENT AND PLAN:  1.  Atrial fib with RVR.   His rate is much better now that he's on metoprolol. Intact his rate is little too slow. He is on diltiazem which we will need to stop because of his chronic systolic congestive heart failure. We will discontinue the diltiazem and start him on metoprolol 50 kg twice a day. Stressed the importance of taking his Xarelto every day.  2. Chronic systolic congestive heart failure: The patient was found have an EF of around 25%. Hopefully this is due to atrial fibrillation with a rapid ventricular response. We'll still need to evaluate him for coronary artery disease at some point but I would like to try to get his left ventricle systolic function better before he undergoes cath.  He is not had any episodes of angina. We will be stopping the diltiazem and increasing his metoprolol. He's on lisinopril/HCTZ. We'll consider adding Lasix during his next visit if his blood pressure can tolerate it. We may also need to add Aldactone.  We briefly discussed cardiac catheterization.   Current medicines are reviewed at length with the patient today.  The patient does not have concerns regarding medicines.  The following changes have been made: DC diltiazem Increase metoprolol to 50 BID   Labs/ tests ordered today include:  No orders of the defined types were placed in this encounter.     Disposition:   FU with me  in 1 month Will  try to find an easy way to get him xarelto or get his INR levels checked.   Signed, Sharron Petruska, Wonda Cheng, MD  09/20/2014 10:26 AM    [facility] [place], [place], [place]  [address] Phone: [phone]; Fax: [phone]

## 2014-10-19 ENCOUNTER — Ambulatory Visit (INDEPENDENT_AMBULATORY_CARE_PROVIDER_SITE_OTHER): Payer: Self-pay | Admitting: Cardiovascular Disease

## 2014-10-19 ENCOUNTER — Encounter: Payer: Self-pay | Admitting: Cardiovascular Disease

## 2014-10-19 VITALS — BP 154/102 | HR 74 | Ht 75.0 in | Wt 256.4 lb

## 2014-10-19 DIAGNOSIS — I5022 Chronic systolic (congestive) heart failure: Secondary | ICD-10-CM

## 2014-10-19 DIAGNOSIS — I447 Left bundle-branch block, unspecified: Secondary | ICD-10-CM

## 2014-10-19 DIAGNOSIS — I481 Persistent atrial fibrillation: Secondary | ICD-10-CM

## 2014-10-19 DIAGNOSIS — I4819 Other persistent atrial fibrillation: Secondary | ICD-10-CM

## 2014-10-19 DIAGNOSIS — I10 Essential (primary) hypertension: Secondary | ICD-10-CM

## 2014-10-19 MED ORDER — CARVEDILOL 6.25 MG PO TABS: 6.2500 mg | ORAL_TABLET | Freq: Two times a day (BID) | ORAL | Status: DC

## 2014-10-19 MED ORDER — POTASSIUM CHLORIDE CRYS ER 20 MEQ PO TBCR: 20.0000 meq | EXTENDED_RELEASE_TABLET | Freq: Every day | ORAL | Status: DC

## 2014-10-19 MED ORDER — FUROSEMIDE 40 MG PO TABS: 40.0000 mg | ORAL_TABLET | Freq: Every day | ORAL | Status: DC

## 2014-10-19 MED ORDER — DILTIAZEM HCL ER COATED BEADS 120 MG PO CP24: 120.0000 mg | ORAL_CAPSULE | Freq: Every day | ORAL | Status: DC

## 2014-10-19 NOTE — Progress Notes (Signed)
Cardiology Office Note   Date:  10/19/2014   ID:  Coston, Mandato [birth year], MRN [id number]  PCP:  Mack Hook, MD  Cardiologist:   Thayer Headings, MD   Chief Complaint  Patient presents with  . Follow-up    CHf,atrial fib   Problem List 1. Atrial fibrillation 2. CHF  - EF  25% 3. LBBB   History of Present Illness: Kyle Burke is a 69 y.o. male orginially from [place], who presents for evaluation of newly diagnosed atrial fib.   The patient speaks some english.  No intrepreter was available.  Was an urgent work in from [employer] - Dr. Amil Amen. He presented to Dr. Mahala Menghini with palpitations in his left chest.  Was found to have a-fib and was referred to [place].  No CP , no dyspnea.  Works in Loss adjuster, chartered.  Non smoker Drinks beer occasionally   Feb. 3, 2015:  Drayson is feeling better.  Is seen with intrepreter , [name].   We performed an echocardiogram which reveals severe left ventricle systolic dysfunction. His ejection fraction is 25-30%. He was referred to [place] several weeks ago for evaluatino of A-fib with RVR We started him on Xarelto, metooprolol,  He had some slight dizziness when he first started the metoprolol but this is largely resolved. He remains on diltiazem.  He is still at work . Breathing is much better.   October 19, 2014:  Pt is seen today with intrepreter - [name].  He did not tolerate the metoprolol - stopped it on his own.  Dr. Tawni Carnes restarted Diltiazem 240    Past Medical History  Diagnosis Date  . Hypertension   . Diabetes mellitus     Past Surgical History  Procedure Laterality Date  . Incise and drain abcess  1986    on leg  . Inguinal hernia repair  01/16/2012    Procedure: LAPAROSCOPIC INGUINAL HERNIA;  Surgeon: Gayland Curry, MD,FACS;  Location: [facility];  Service: General;  Laterality: Left;  Laparoscopic incarcerated Left inguinal hernia repair with mesh, umbilical hernia repair  . Umbilical  hernia repair  01/16/2012    Procedure: HERNIA REPAIR UMBILICAL ADULT;  Surgeon: Gayland Curry, MD,FACS;  Location: [facility];  Service: General;  Laterality: N/A;     Current Outpatient Prescriptions  Medication Sig Dispense Refill  . lisinopril-hydrochlorothiazide (PRINZIDE,ZESTORETIC) 20-12.5 MG per tablet Take 1 tablet by mouth daily with breakfast.     . metFORMIN (GLUCOPHAGE) 500 MG tablet Take 500 mg by mouth 2 (two) times daily with a meal.    . rivaroxaban (XARELTO) 20 MG TABS tablet Take 20 mg by mouth daily with supper.    . carvedilol (COREG) 6.25 MG tablet Take 1 tablet (6.25 mg total) by mouth 2 (two) times daily. 180 tablet 3  . diltiazem (CARDIZEM CD) 120 MG 24 hr capsule Take 1 capsule (120 mg total) by mouth daily. 31 capsule 3  . furosemide (LASIX) 40 MG tablet Take 1 tablet (40 mg total) by mouth daily. 90 tablet 3  . potassium chloride SA (K-DUR,KLOR-CON) 20 MEQ tablet Take 1 tablet (20 mEq total) by mouth daily. 90 tablet 3   No current facility-administered medications for this visit.    Allergies:   Review of patient's allergies indicates no known allergies.    Social History:  The patient  reports that he has never smoked. He does not have any smokeless tobacco history on file. He reports that he drinks about 0.6 oz of  alcohol per week. He reports that he does not use illicit drugs.   Family History:  The patient's family history includes Heart disease in his father; Stroke in his mother.    ROS:  Please see the history of present illness.   Otherwise, review of systems are positive for none.   All other systems are reviewed and negative.    PHYSICAL EXAM: VS:  BP 154/102 mmHg  Pulse 74  Ht 6\' 3"  (1.905 m)  Wt 256 lb 6.4 oz (116.302 kg)  BMI 32.05 kg/m2  SpO2 99% , BMI Body mass index is 32.05 kg/(m^2). GEN: Well nourished, well developed, in no acute distress HEENT: normal Neck: no JVD, carotid bruits, or masses Cardiac: Irreg. Irreg. ; soft systolic   Murmur. No , rubs, or gallops,no edema  Respiratory:  clear to auscultation bilaterally, normal work of breathing GI: soft, nontender, nondistended, + BS MS: no deformity or atrophy Skin: warm and dry, no rash Neuro:  Strength and sensation are intact Psych: euthymic mood, full affect   EKG:  EKG is not ordered today. Atrial fib with rate of 77. LBBB.    Recent Labs: 09/15/2014: ALT 149*; BUN 17; Creatinine 1.04; Hemoglobin 12.7*; Platelets 246; Potassium 4.0; Sodium 140; TSH 1.473    Lipid Panel    Component Value Date/Time   CHOL 155 09/15/2014 1037   TRIG 58 09/15/2014 1037   HDL 39* 09/15/2014 1037   CHOLHDL 4.0 09/15/2014 1037   VLDL 12 09/15/2014 1037   LDLCALC 104* 09/15/2014 1037      Wt Readings from Last 3 Encounters:  10/19/14 256 lb 6.4 oz (116.302 kg)  09/20/14 256 lb 1.9 oz (116.175 kg)  09/06/14 260 lb 12.8 oz (118.298 kg)      Other studies Reviewed: Additional studies/ records that were reviewed today include: records from Dr. Tawni Carnes. Review of the above records demonstrates: atrial fib    ASSESSMENT AND PLAN:  1.  Atrial fib with RVR.   We had gradually increased up the metoprolol and he stopped due to fatigue.  He has severe LV dysfunction and we need to attempt to keep him on a beta blocker if possible.   Stressed the importance of taking his Xarelto every day.  2. Chronic systolic congestive heart failure: The patient was found have an EF of around 25%. He is off the metoprolol.  He stopped because of profound fatigue. We had a long discussion about the long-term benefits of beta blockers in the setting of systolic CHF. Will try coreg 6. 25 BID.  Will decrease the Diltiazem CD to 120 a day and will attempt to DC completely at his next visit.  Will increase the lasix to 40 mg a day and add Kdur 20 meq .  Will check BMP at next office visit.    Current medicines are reviewed at length with the patient today.  The patient does not have concerns  regarding medicines.  The following changes have been made: decrease Diltiazem to 120 a day  Start Coreg 6. 25 bid.    Labs/ tests ordered today include:   Orders Placed This Encounter  Procedures  . Myocardial Perfusion Imaging  . EKG 12-Lead     Disposition:   FU with me  in 1 month   Signed, Nahser, Wonda Cheng, MD  10/19/2014 4:15 PM    [facility] [place], [place], [place]  [address] Phone: [phone]; Fax: [phone]

## 2014-10-19 NOTE — Patient Instructions (Signed)
Your physician has recommended you make the following change in your medication:  STOP Diltiazem (Cardizem) 240 mg START Diltiazem (Cardizem) 120 mg START Carvedilol (Coreg) 6.25 mg twice daily - take 12 hours apart START Lasix (Furosemide) 40 mg once daily START Kdur (potassium supplement) 20 meq once daily   Your physician has requested that you have an adenosine myoview. For further information please visit [email or web address]. Please follow instruction sheet, as given.  Your physician recommends that you schedule a follow-up appointment in: 1 month with Dr. Acie Fredrickson.  Your physician recommends that you return for lab work in: at next appointment with Dr. Acie Fredrickson - basic metabolic panel

## 2014-11-01 ENCOUNTER — Ambulatory Visit (HOSPITAL_COMMUNITY): Payer: No Typology Code available for payment source | Attending: Cardiovascular Disease | Admitting: Radiology

## 2014-11-01 ENCOUNTER — Encounter (HOSPITAL_COMMUNITY): Payer: Self-pay

## 2014-11-01 DIAGNOSIS — I429 Cardiomyopathy, unspecified: Secondary | ICD-10-CM

## 2014-11-01 DIAGNOSIS — I4819 Other persistent atrial fibrillation: Secondary | ICD-10-CM

## 2014-11-01 DIAGNOSIS — I447 Left bundle-branch block, unspecified: Secondary | ICD-10-CM | POA: Insufficient documentation

## 2014-11-01 DIAGNOSIS — I481 Persistent atrial fibrillation: Secondary | ICD-10-CM | POA: Insufficient documentation

## 2014-11-01 DIAGNOSIS — I428 Other cardiomyopathies: Secondary | ICD-10-CM

## 2014-11-01 DIAGNOSIS — I5022 Chronic systolic (congestive) heart failure: Secondary | ICD-10-CM | POA: Insufficient documentation

## 2014-11-01 HISTORY — DX: Other cardiomyopathies: I42.8

## 2014-11-01 HISTORY — DX: Cardiomyopathy, unspecified: I42.9

## 2014-11-01 MED ORDER — TECHNETIUM TC 99M SESTAMIBI GENERIC - CARDIOLITE
33.0000 | Freq: Once | INTRAVENOUS | Status: AC | PRN
Start: 2014-11-01 — End: 2014-11-01
  Administered 2014-11-01: 33 via INTRAVENOUS

## 2014-11-01 MED ORDER — TECHNETIUM TC 99M SESTAMIBI GENERIC - CARDIOLITE
11.0000 | Freq: Once | INTRAVENOUS | Status: AC | PRN
  Administered 2014-11-01: 11 via INTRAVENOUS

## 2014-11-01 MED ORDER — ADENOSINE (DIAGNOSTIC) 3 MG/ML IV SOLN
0.5600 mg/kg | Freq: Once | INTRAVENOUS | Status: AC
  Administered 2014-11-01: 57.9 mg via INTRAVENOUS

## 2014-11-01 NOTE — Progress Notes (Signed)
Kyle Burke (DOB 1946-08-17) here for a Adenosine cardiolite today. His weight was 228 today compared to 256.4 on 10-19-2014. Notified Dr. Mertie Moores, MD and Christen Bame, RN/Davionne Mastrangelo Oletta Lamas, RN.

## 2014-11-01 NOTE — Progress Notes (Signed)
His lasix was increased at his last visit. This is likely the cause of his weight loss.

## 2014-11-01 NOTE — Progress Notes (Signed)
[facility] SITE [facility] NUCLEAR MED [address] [place], [place] [address] [phone]    Cardiology Nuclear Med Study  Kyle Burke is a 69 y.o. male     MRN : [id number]     DOB: 06-23-1946  Procedure Date: 11/01/2014  Nuclear Med Background Indication for Stress Test:  Evaluation for Ischemia, and 09-15-2014 Echo: EF 25-30% with recent diagnosis AFIB/RVR History:  AFIB, CHF Cardiac Risk Factors: Hypertension, LBBB and NIDDM  Symptoms:  Fatigue and Palpitations   Nuclear Pre-Procedure Caffeine/Decaff Intake:  None> 12 hrs NPO After: 8:30pm   Lungs:  clear O2 Sat: 96% on room air. IV 0.9% NS with Angio Cath:  20g  IV Site: L Antecubital x 1, tolerated well IV Started by:  Irven Baltimore, RN  Chest Size (in):  40 Cup Size: n/a  Height: 6\' 3"  (1.905 m)  Weight:  228 lb (103.42 kg)  BMI:  Body mass index is 28.5 kg/(m^2). Tech Comments:  Patient took Coreg and Lisinopril this am . Spanish Interpreter present. Irven Baltimore, RN.    Nuclear Med Study 1 or 2 day study: 1 day  Stress Test Type:  Adenosine  Reading MD: N/A  Order Authorizing Provider:  Mertie Moores, MD  Resting Radionuclide: Technetium 17m Sestamibi  Resting Radionuclide Dose: 11.0 mCi   Stress Radionuclide:  Technetium 62m Sestamibi  Stress Radionuclide Dose: 33.0 mCi           Stress Protocol Rest HR: 55 Stress HR: 65  Rest BP: 112/65 Stress BP: 113/74  Exercise Time (min): n/a METS: n/a   Predicted Max HR: 153 bpm % Max HR: 42.48 bpm Rate Pressure Product: 7345   Dose of Adenosine (mg):  58.0 Dose of Lexiscan: n/a mg  Dose of Atropine (mg): n/a Dose of Dobutamine: n/a mcg/kg/min (at max HR)  Stress Test Technologist: Perrin Maltese, EMT-P  Nuclear Technologist:  Earl Many, CNMT     Rest Procedure:  Myocardial perfusion imaging was performed at rest 45 minutes following the intravenous administration of Technetium 100m Sestamibi. Rest ECG: Atrial fibrillation with slow ventricular  response decreased anterior R-wave progression. Diffuse T-wave inversion in the precordial leads.  Stress Procedure:  The patient received IV Lexiscan 0.4 mg over 15-seconds.  Technetium 58m Sestamibi injected at 30-seconds. This patient had no symptoms with the Adenosine infusion. Quantitative spect images were obtained after a 45 minute delay. Stress ECG: No significant change from baseline ECG  QPS Raw Data Images:  Normal; no motion artifact; normal heart/lung ratio. Stress Images:  There is significant dilatation of the left ventricle. There is diaphragmatic attenuation. There are no discrete areas showing marked decrease in uptake. Rest Images:  Rest images were the same as the stress images. Subtraction (SDS):  No evidence of ischemia. Transient Ischemic Dilatation (Normal <1.22):  0.99 Lung/Heart Ratio (Normal <0.45):  0.29  Quantitative Gated Spect Images QGS EDV:  272 ml QGS ESV:  212 ml  Impression Exercise Capacity:  Adenosine study with no exercise. BP Response:  Normal blood pressure response. Clinical Symptoms:  No significant symptoms noted. ECG Impression:  No significant ST segment change suggestive of ischemia. Comparison with Prior Nuclear Study: No images to compare  Overall Impression:  This study is abnormal. It is high risk because of severe left ventricular dysfunction. However there is no definite scar. There is no definite ischemia.  LV Ejection Fraction: 22%.  LV Wall Motion:  Severe global hypokinesis.  Daryel November, MD

## 2014-11-06 LAB — HM DIABETES FOOT EXAM: HM Diabetic Foot Exam: NORMAL

## 2014-12-15 ENCOUNTER — Other Ambulatory Visit (INDEPENDENT_AMBULATORY_CARE_PROVIDER_SITE_OTHER): Payer: No Typology Code available for payment source | Admitting: *Deleted

## 2014-12-15 ENCOUNTER — Encounter: Payer: Self-pay | Admitting: Cardiovascular Disease

## 2014-12-15 ENCOUNTER — Ambulatory Visit (INDEPENDENT_AMBULATORY_CARE_PROVIDER_SITE_OTHER): Payer: No Typology Code available for payment source | Admitting: Cardiovascular Disease

## 2014-12-15 VITALS — BP 130/80 | HR 81 | Ht 75.0 in | Wt 230.8 lb

## 2014-12-15 DIAGNOSIS — I4819 Other persistent atrial fibrillation: Secondary | ICD-10-CM

## 2014-12-15 DIAGNOSIS — I5022 Chronic systolic (congestive) heart failure: Secondary | ICD-10-CM

## 2014-12-15 DIAGNOSIS — I447 Left bundle-branch block, unspecified: Secondary | ICD-10-CM

## 2014-12-15 DIAGNOSIS — I481 Persistent atrial fibrillation: Secondary | ICD-10-CM

## 2014-12-15 DIAGNOSIS — I10 Essential (primary) hypertension: Secondary | ICD-10-CM

## 2014-12-15 LAB — BASIC METABOLIC PANEL
BUN: 20 mg/dL (ref 6–23)
CALCIUM: 9.7 mg/dL (ref 8.4–10.5)
CO2: 27 mEq/L (ref 19–32)
Chloride: 104 mEq/L (ref 96–112)
Creatinine, Ser: 0.77 mg/dL (ref 0.40–1.50)
GFR: 106.91 mL/min (ref >60.00)
GLUCOSE: 85 mg/dL (ref 70–99)
Potassium: 4 mEq/L (ref 3.5–5.1)
SODIUM: 137 meq/L (ref 135–145)

## 2014-12-15 MED ORDER — CARVEDILOL 12.5 MG PO TABS: 12.5000 mg | ORAL_TABLET | Freq: Two times a day (BID) | ORAL | Status: DC

## 2014-12-15 NOTE — Patient Instructions (Addendum)
Medication Instructions:  STOP Diltiazem INCREASE Coreg (Carvedilol) to 12.5 mg twice daily - take 12 hours apart  Labwork: TODAY - basic metabolic panel  Testing/Procedures: None  Follow-Up: Your physician recommends that you schedule a follow-up appointment in: 2 months with Dr. Acie Fredrickson

## 2014-12-15 NOTE — Progress Notes (Signed)
Cardiology Office Note   Date:  12/15/2014   ID:  Kyle Burke 1946/04/20, MRN [id number]  PCP:  Mack Hook, MD  Cardiologist:   Thayer Headings, MD   Chief Complaint  Patient presents with  . Congestive Heart Failure   Problem List 1. Atrial fibrillation 2. CHF  - EF  25% 3. LBBB   History of Present Illness: Kyle Burke is a 69 y.o. male orginially from [place], who presents for evaluation of newly diagnosed atrial fib.   The patient speaks some english.  No intrepreter was available.  Was an urgent work in from [employer] - Dr. Amil Amen. He presented to Dr. Mahala Menghini with palpitations in his left chest.  Was found to have a-fib and was referred to [place].  No CP , no dyspnea.  Works in Loss adjuster, chartered.  Non smoker Drinks beer occasionally   Feb. 3, 2015:  Kyle Burke is feeling better.  Is seen with intrepreter , Kyle Burke.   We performed an echocardiogram which reveals severe left ventricle systolic dysfunction. His ejection fraction is 25-30%. He was referred to [place] several weeks ago for evaluatino of A-fib with RVR We started him on Xarelto, metooprolol,  He had some slight dizziness when he first started the metoprolol but this is largely resolved. He remains on diltiazem.  He is still at work . Breathing is much better.   October 19, 2014:  Pt is seen today with intrepreter - Kyle Burke.  He did not tolerate the metoprolol - stopped it on his own.  Dr. Tawni Carnes restarted Diltiazem 240  December 15, 2014; Pt was seen with intrepreter, Kyle Burke.   He is doing well.  Working 4 times a week.  Also works in his garden on his off days.  Has some pain in his legs. Not cramping    Past Medical History  Diagnosis Date  . Hypertension   . Diabetes mellitus     Past Surgical History  Procedure Laterality Date  . Incise and drain abcess  1986    on leg  . Inguinal hernia repair  01/16/2012    Procedure: LAPAROSCOPIC INGUINAL  HERNIA;  Surgeon: Gayland Curry, MD,FACS;  Location: [facility];  Service: General;  Laterality: Left;  Laparoscopic incarcerated Left inguinal hernia repair with mesh, umbilical hernia repair  . Umbilical hernia repair  01/16/2012    Procedure: HERNIA REPAIR UMBILICAL ADULT;  Surgeon: Gayland Curry, MD,FACS;  Location: [facility];  Service: General;  Laterality: N/A;     Current Outpatient Prescriptions  Medication Sig Dispense Refill  . carvedilol (COREG) 6.25 MG tablet Take 1 tablet (6.25 mg total) by mouth 2 (two) times daily. 180 tablet 3  . diltiazem (CARDIZEM CD) 120 MG 24 hr capsule Take 1 capsule (120 mg total) by mouth daily. 31 capsule 3  . furosemide (LASIX) 40 MG tablet Take 1 tablet (40 mg total) by mouth daily. (Patient taking differently: Take 40 mg by mouth daily. PATIENT TAKES 1/2 TABLET BY MOUTH DAILY.) 90 tablet 3  . lisinopril-hydrochlorothiazide (PRINZIDE,ZESTORETIC) 20-12.5 MG per tablet Take 1 tablet by mouth daily with breakfast.     . metFORMIN (GLUCOPHAGE) 500 MG tablet Take 500 mg by mouth 2 (two) times daily with a meal.    . potassium chloride SA (K-DUR,KLOR-CON) 20 MEQ tablet Take 1 tablet (20 mEq total) by mouth daily. 90 tablet 3  . rivaroxaban (XARELTO) 20 MG TABS tablet Take 20 mg by mouth daily with supper.  No current facility-administered medications for this visit.    Allergies:   Review of patient's allergies indicates no known allergies.    Social History:  The patient  reports that he has never smoked. He does not have any smokeless tobacco history on file. He reports that he drinks about 0.6 oz of alcohol per week. He reports that he does not use illicit drugs.   Family History:  The patient's family history includes Heart disease in his father; Stroke in his mother.    ROS:  Please see the history of present illness.   Otherwise, review of systems are positive for none.   All other systems are reviewed and negative.    PHYSICAL EXAM: VS:  BP 130/80  mmHg  Pulse 81  Ht 6\' 3"  (1.905 m)  Wt 230 lb 12.8 oz (104.69 kg)  BMI 28.85 kg/m2 , BMI Body mass index is 28.85 kg/(m^2). GEN: Well nourished, well developed, in no acute distress HEENT: normal Neck: no JVD, carotid bruits, or masses Cardiac: Irreg. Irreg. ; soft systolic  Murmur. No , rubs, or gallops,no edema  Respiratory:  clear to auscultation bilaterally, normal work of breathing GI: soft, nontender, nondistended, + BS MS: no deformity or atrophy Skin: warm and dry, no rash Neuro:  Strength and sensation are intact Psych: euthymic mood, full affect   EKG:  EKG is not ordered today. Atrial fib with rate of 77. LBBB.    Recent Labs: 09/15/2014: ALT 149*; BUN 17; Creatinine 1.04; Hemoglobin 12.7*; Platelets 246; Potassium 4.0; Sodium 140; TSH 1.473    Lipid Panel    Component Value Date/Time   CHOL 155 09/15/2014 1037   TRIG 58 09/15/2014 1037   HDL 39* 09/15/2014 1037   CHOLHDL 4.0 09/15/2014 1037   VLDL 12 09/15/2014 1037   LDLCALC 104* 09/15/2014 1037      Wt Readings from Last 3 Encounters:  12/15/14 230 lb 12.8 oz (104.69 kg)  11/01/14 228 lb (103.42 kg)  10/19/14 256 lb 6.4 oz (116.302 kg)      Other studies Reviewed: Additional studies/ records that were reviewed today include: records from Dr. Tawni Carnes. Review of the above records demonstrates: atrial fib    ASSESSMENT AND PLAN:  1.  Atrial fib with RVR.   He seems to be tolerating the carvedilol a bit better. We'll continue to gradually titrate up the carvedilol. We'll discontinue the diltiazem.  Stressed the importance of taking his Xarelto every day.  2. Chronic systolic congestive heart failure: The patient was found have an EF of around 25%.    Will DC the diltiazem.  Double the coreg to 12. 5 BID.   BMP today .Marland Kitchen  Will check BMP at next office visit.    Current medicines are reviewed at length with the patient today.  The patient does not have concerns regarding medicines. .    Labs/ tests  ordered today include:   No orders of the defined types were placed in this encounter.     Disposition:   FU with me  in 2  month   Signed, Jamyla Ard, Wonda Cheng, MD  12/15/2014 2:19 PM    [facility] [address], [place], [place]  [address] Phone: [phone]; Fax: [phone]

## 2014-12-15 NOTE — Addendum Note (Signed)
Addended by: Eulis Foster on: 12/15/2014 02:36 PM   Modules accepted: Orders

## 2014-12-18 ENCOUNTER — Telehealth: Payer: Self-pay | Admitting: Nurse Practitioner

## 2014-12-18 NOTE — Telephone Encounter (Signed)
Labs are ok per Dr. Duaine Dredge and notified patient of results.  Patient verbalized understanding

## 2015-01-23 ENCOUNTER — Ambulatory Visit: Payer: Self-pay

## 2015-02-14 ENCOUNTER — Encounter: Payer: Self-pay | Admitting: Cardiovascular Disease

## 2015-02-14 ENCOUNTER — Ambulatory Visit (INDEPENDENT_AMBULATORY_CARE_PROVIDER_SITE_OTHER): Payer: Self-pay | Admitting: Cardiovascular Disease

## 2015-02-14 VITALS — BP 128/82 | HR 68 | Ht 75.0 in | Wt 232.0 lb

## 2015-02-14 DIAGNOSIS — I482 Chronic atrial fibrillation, unspecified: Secondary | ICD-10-CM

## 2015-02-14 DIAGNOSIS — I10 Essential (primary) hypertension: Secondary | ICD-10-CM

## 2015-02-14 DIAGNOSIS — I5022 Chronic systolic (congestive) heart failure: Secondary | ICD-10-CM

## 2015-02-14 NOTE — Progress Notes (Signed)
Cardiology Office Note   Date:  02/14/2015   ID:  Kyle Burke, Kyle Burke 02/03/1946, MRN [id number]  PCP:  Mack Hook, MD  Cardiologist:   Thayer Headings, MD   Chief Complaint  Patient presents with  . Follow-up    CHF   Problem List 1. Atrial fibrillation 2. CHF  - EF  25% 3. LBBB   History of Present Illness: Kyle Burke is a 68 y.o. male orginially from [place], who presents for evaluation of newly diagnosed atrial fib.   The patient speaks some english.  No intrepreter was available.  Was an urgent work in from [employer] - Dr. Amil Amen. He presented to Dr. Mahala Menghini with palpitations in his left chest.  Was found to have a-fib and was referred to [place].  No CP , no dyspnea.  Works in Loss adjuster, chartered.  Non smoker Drinks beer occasionally   Feb. 3, 2015:  Kyle Burke is feeling better.  Is seen with intrepreter , [name].   We performed an echocardiogram which reveals severe left ventricle systolic dysfunction. His ejection fraction is 25-30%. He was referred to [place] several weeks ago for evaluatino of A-fib with RVR We started him on Xarelto, metooprolol,  He had some slight dizziness when he first started the metoprolol but this is largely resolved. He remains on diltiazem.  He is still at work . Breathing is much better.   October 19, 2014:  Pt is seen today with intrepreter - Kyle Burke.  He did not tolerate the metoprolol - stopped it on his own.  Dr. Tawni Carnes restarted Diltiazem 240  December 15, 2014; Pt was seen with intrepreter, Kyle Burke.   He is doing well.  Working 4 times a week.  Also works in his garden on his off days.  Has some pain in his legs. Not cramping   June 29 , 2016:  Seen with Intrepreter, Kyle Burke.  Breathing is ok.  Generally feeling stronger since we have started the CHF meds. Has occasional palpitations at night  Still able to work 4 times a week.    Past Medical History  Diagnosis Date  .  Hypertension   . Diabetes mellitus     Past Surgical History  Procedure Laterality Date  . Incise and drain abcess  1986    on leg  . Inguinal hernia repair  01/16/2012    Procedure: LAPAROSCOPIC INGUINAL HERNIA;  Surgeon: Gayland Curry, MD,FACS;  Location: [facility];  Service: General;  Laterality: Left;  Laparoscopic incarcerated Left inguinal hernia repair with mesh, umbilical hernia repair  . Umbilical hernia repair  01/16/2012    Procedure: HERNIA REPAIR UMBILICAL ADULT;  Surgeon: Gayland Curry, MD,FACS;  Location: [facility];  Service: General;  Laterality: N/A;     Current Outpatient Prescriptions  Medication Sig Dispense Refill  . carvedilol (COREG) 6.25 MG tablet Take 6.25 mg by mouth 2 (two) times daily with a meal.    . furosemide (LASIX) 40 MG tablet Take 1 tablet (40 mg total) by mouth daily. 90 tablet 3  . lisinopril (PRINIVIL,ZESTRIL) 40 MG tablet Take 40 mg by mouth daily.    . metFORMIN (GLUCOPHAGE) 500 MG tablet Take 500 mg by mouth 2 (two) times daily with a meal.    . potassium chloride SA (K-DUR,KLOR-CON) 20 MEQ tablet Take 1 tablet (20 mEq total) by mouth daily. 90 tablet 3  . rivaroxaban (XARELTO) 20 MG TABS tablet Take 20 mg by mouth daily with supper.  No current facility-administered medications for this visit.    Allergies:   Review of patient's allergies indicates no known allergies.    Social History:  The patient  reports that he has never smoked. He does not have any smokeless tobacco history on file. He reports that he drinks about 0.6 oz of alcohol per week. He reports that he does not use illicit drugs.   Family History:  The patient's family history includes Heart disease in his father; Stroke in his mother.    ROS:  Please see the history of present illness.   Otherwise, review of systems are positive for none.   All other systems are reviewed and negative.    PHYSICAL EXAM: VS:  BP 128/82 mmHg  Pulse 68  Ht 6\' 3"  (1.905 m)  Wt 105.235 kg (232  lb)  BMI 29.00 kg/m2  SpO2 98% , BMI Body mass index is 29 kg/(m^2). GEN: Well nourished, well developed, in no acute distress HEENT: normal Neck: no JVD, carotid bruits, or masses Cardiac: Irreg. Irreg. ; soft systolic  Murmur. No , rubs, or gallops,no edema  Respiratory:  clear to auscultation bilaterally, normal work of breathing GI: soft, nontender, nondistended, + BS MS: no deformity or atrophy Skin: warm and dry, no rash Neuro:  Strength and sensation are intact Psych: euthymic mood, full affect   EKG:  EKG is not ordered today. Atrial fib with rate of 77. LBBB.    Recent Labs: 09/15/2014: ALT 149*; Hemoglobin 12.7*; Platelets 246; TSH 1.473 12/15/2014: BUN 20; Creatinine, Ser 0.77; Potassium 4.0; Sodium 137    Lipid Panel    Component Value Date/Time   CHOL 155 09/15/2014 1037   TRIG 58 09/15/2014 1037   HDL 39* 09/15/2014 1037   CHOLHDL 4.0 09/15/2014 1037   VLDL 12 09/15/2014 1037   LDLCALC 104* 09/15/2014 1037      Wt Readings from Last 3 Encounters:  02/14/15 105.235 kg (232 lb)  12/15/14 104.69 kg (230 lb 12.8 oz)  11/01/14 103.42 kg (228 lb)      Other studies Reviewed: Additional studies/ records that were reviewed today include: records from Dr. Tawni Carnes. Review of the above records demonstrates: atrial fib    ASSESSMENT AND PLAN:  1.  Atrial fib with RVR.   His atrial fib rate is well controlled.  Continue current meds.   2. Chronic systolic congestive heart failure: The patient was found have an EF of around 25%.    Doing well on coreg. Lisinoopril. Lasix.  Will get an echo to reassess his LV function.  Will consider adding Aldactone if his EF is still low.  Functionally he seems to be doing very well.   Will see him in 6 months  .   Current medicines are reviewed at length with the patient today.  The patient does not have concerns regarding medicines. .    Labs/ tests ordered today include:   No orders of the defined types were placed  in this encounter.     Disposition:   FU with me  in 6  month   Signed, Nahser, Wonda Cheng, MD  02/14/2015 9:56 AM    [facility] [address], [place], [place]  [address] Phone: [phone]; Fax: [phone]

## 2015-02-14 NOTE — Patient Instructions (Signed)
Medication Instructions:  Your physician recommends that you continue on your current medications as directed. Please refer to the Current Medication list given to you today.   Labwork: Your physician recommends that you return for lab work in: 6 months on the same day as your appointment with Dr. Acie Fredrickson   Testing/Procedures: Your physician has requested that you have an echocardiogram. Echocardiography is a painless test that uses sound waves to create images of your heart. It provides your doctor with information about the size and shape of your heart and how well your heart's chambers and valves are working. This procedure takes approximately one hour. There are no restrictions for this procedure.    Follow-Up: Your physician wants you to follow-up in: 6 months with Dr. Acie Fredrickson.  You will receive a reminder letter in the mail two months in advance. If you don't receive a letter, please call our office to schedule the follow-up appointment.

## 2015-02-19 ENCOUNTER — Other Ambulatory Visit: Payer: Self-pay | Admitting: Cardiovascular Disease

## 2015-02-20 ENCOUNTER — Other Ambulatory Visit: Payer: Self-pay

## 2015-02-20 MED ORDER — CARVEDILOL 6.25 MG PO TABS: 6.2500 mg | ORAL_TABLET | Freq: Two times a day (BID) | ORAL | Status: DC

## 2015-02-21 ENCOUNTER — Other Ambulatory Visit: Payer: Self-pay

## 2015-02-21 ENCOUNTER — Ambulatory Visit (HOSPITAL_COMMUNITY): Payer: Self-pay | Attending: Cardiovascular Disease

## 2015-02-21 DIAGNOSIS — I313 Pericardial effusion (noninflammatory): Secondary | ICD-10-CM | POA: Insufficient documentation

## 2015-02-21 DIAGNOSIS — I517 Cardiomegaly: Secondary | ICD-10-CM | POA: Insufficient documentation

## 2015-02-21 DIAGNOSIS — I351 Nonrheumatic aortic (valve) insufficiency: Secondary | ICD-10-CM | POA: Insufficient documentation

## 2015-02-21 DIAGNOSIS — I482 Chronic atrial fibrillation, unspecified: Secondary | ICD-10-CM

## 2015-02-21 DIAGNOSIS — I5022 Chronic systolic (congestive) heart failure: Secondary | ICD-10-CM

## 2015-02-23 ENCOUNTER — Other Ambulatory Visit: Payer: Self-pay | Admitting: *Deleted

## 2015-02-23 ENCOUNTER — Telehealth: Payer: Self-pay | Admitting: Nurse Practitioner

## 2015-02-23 ENCOUNTER — Telehealth: Payer: Self-pay | Admitting: Cardiovascular Disease

## 2015-02-23 MED ORDER — CARVEDILOL 6.25 MG PO TABS: 6.2500 mg | ORAL_TABLET | Freq: Two times a day (BID) | ORAL | Status: DC

## 2015-02-23 MED ORDER — CARVEDILOL 12.5 MG PO TABS: 12.5000 mg | ORAL_TABLET | Freq: Two times a day (BID) | ORAL | Status: DC

## 2015-02-23 NOTE — Telephone Encounter (Addendum)
Spoke with patient through [place] interpreter [phone].  I reviewed results and plan of care from patient's echo.  Patient states he is concerned about the cost of the device.  I advised him that I will try to find out information for him regarding cost.  He is in agreement to see Dr. Lovena Le for consult on 7/25 at 3:30.  Patient denied further questions or concerns.

## 2015-02-23 NOTE — Telephone Encounter (Signed)
[place] [facility] requested to speak w/ RN in order to clarify Correg RX.  Please call back and discuss.

## 2015-02-23 NOTE — Telephone Encounter (Signed)
Spoke with Kyung Bacca, Pharmacist at [facility] who called to clarify patient's Rx for Carvedilol.  The patient had been taking 6.25 mg BID and dose was recently increased to 12.5 mg BID at office visit on 6/29.  I apologized to her for sending the Rx for the 6.25 mg tablets and sent new Rx for 12.5 mg tablets.  I thanked her for the call.

## 2015-02-23 NOTE — Telephone Encounter (Signed)
-----   Message from Thayer Headings, MD sent at 02/22/2015  5:36 PM EDT ----- His EF has not improved much.  On near maximal medical therapy.  Refer to EP for consideration for Bi-V pacer / ICD

## 2015-03-12 ENCOUNTER — Encounter: Payer: Self-pay | Admitting: Internal Medicine

## 2015-03-12 ENCOUNTER — Ambulatory Visit (INDEPENDENT_AMBULATORY_CARE_PROVIDER_SITE_OTHER): Payer: No Typology Code available for payment source | Admitting: Internal Medicine

## 2015-03-12 VITALS — BP 140/80 | HR 69 | Ht 75.0 in | Wt 232.8 lb

## 2015-03-12 DIAGNOSIS — I4819 Other persistent atrial fibrillation: Secondary | ICD-10-CM

## 2015-03-12 DIAGNOSIS — I5022 Chronic systolic (congestive) heart failure: Secondary | ICD-10-CM

## 2015-03-12 DIAGNOSIS — I481 Persistent atrial fibrillation: Secondary | ICD-10-CM

## 2015-03-12 DIAGNOSIS — I482 Chronic atrial fibrillation, unspecified: Secondary | ICD-10-CM

## 2015-03-12 DIAGNOSIS — I10 Essential (primary) hypertension: Secondary | ICD-10-CM

## 2015-03-12 DIAGNOSIS — I447 Left bundle-branch block, unspecified: Secondary | ICD-10-CM

## 2015-03-12 NOTE — Patient Instructions (Signed)
Medication Instructions:  Your physician recommends that you continue on your current medications as directed. Please refer to the Current Medication list given to you today.  Labwork: NONE  Testing/Procedures: NONE  Follow-Up: Follow up with Dr. Lovena Le as needed.   Keep follow up appointments with Dr. Acie Fredrickson.  Any Other Special Instructions Will Be Listed Below (If Applicable).

## 2015-03-18 NOTE — Assessment & Plan Note (Signed)
His blood pressure is elevated. He will continue his current meds. He may need uptitration over time.

## 2015-03-18 NOTE — Progress Notes (Signed)
HPI [name] is referred today by Dr. Acie Burke to consider ICD implant. He is a 68 yo man with chronic systolic heart failure dating back to 2015. He has been treated with rate control and systemic anti-coagulation. He denies medical non-compliance. 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 He has a physically demanding job which he performs without difficulty. No other exercise. He has been maintained on systemic anti-coagulation. He has intermittent LBBB.  No Known Allergies   Current Outpatient Prescriptions  Medication Sig Dispense Refill  . carvedilol (COREG) 12.5 MG tablet Take 1 tablet (12.5 mg total) by mouth 2 (two) times daily. 180 tablet 3  . furosemide (LASIX) 40 MG tablet Take 1 tablet (40 mg total) by mouth daily. 90 tablet 3  . lisinopril (PRINIVIL,ZESTRIL) 40 MG tablet Take 40 mg by mouth daily.    . metFORMIN (GLUCOPHAGE) 500 MG tablet Take 500 mg by mouth 2 (two) times daily with a meal.    . potassium chloride SA (K-DUR,KLOR-CON) 20 MEQ tablet Take 1 tablet (20 mEq total) by mouth daily. 90 tablet 3  . rivaroxaban (XARELTO) 20 MG TABS tablet Take 20 mg by mouth daily with supper.     No current facility-administered medications for this visit.     Past Medical History  Diagnosis Date  . Hypertension   . Diabetes mellitus     ROS:   All systems reviewed and negative except as noted in the HPI.   Past Surgical History  Procedure Laterality Date  . Incise and drain abcess  1986    on leg  . Inguinal hernia repair  01/16/2012    Procedure: LAPAROSCOPIC INGUINAL HERNIA;  Surgeon: Kyle Curry, MD,FACS;  Location: [facility];  Service: General;  Laterality: Left;  Laparoscopic incarcerated Left inguinal hernia repair with mesh, umbilical hernia repair  . Umbilical hernia repair  01/16/2012    Procedure: HERNIA REPAIR UMBILICAL ADULT;  Surgeon: Kyle Curry, MD,FACS;  Location: [facility];  Service: General;  Laterality: N/A;     Family History    Problem Relation Age of Onset  . Stroke Mother   . Heart disease Father      History   Social History  . Marital Status: Married    Spouse Name: N/A  . Number of Children: N/A  . Years of Education: N/A   Occupational History  . Not on file.   Social History Main Topics  . Smoking status: Never Smoker   . Smokeless tobacco: Not on file  . Alcohol Use: 0.6 oz/week    1 Cans of beer per week     Comment: rare occ.  . Drug Use: No  . Sexual Activity: Not on file   Other Topics Concern  . Not on file   Social History Narrative     BP 140/80 mmHg  Pulse 69  Ht 6\' 3"  (1.905 m)  Wt 232 lb 12.8 oz (105.597 kg)  BMI 29.10 kg/m2  Physical Exam:  Well appearing 69 yo man, NAD HEENT: Unremarkable Neck:  No JVD, no thyromegally Back:  No CVA tenderness Lungs:  Clear HEART:  IRegular rate rhythm, no murmurs, no rubs, no clicks Abd:  soft, positive bowel sounds, no organomegally, no rebound, no guarding Ext:  2 plus pulses, no edema, no cyanosis, no clubbing Skin:  No rashes no nodules Neuro:  CN II through XII intact, motor grossly intact  EKG - atrial fib with non-specific STT changes  Assess/Plan:

## 2015-03-18 NOTE — Assessment & Plan Note (Signed)
He has class 1 symptoms. He will continue his beta blocker and ACE inhibitor. I discussed ICD implant. His lack of heart failure makes ICD implant relatively contra-indicated. In addition, he is uninsured and stated that he could not afford surgery. His ventricular rate appears to be well controlled making a tachy induced CM less likely.

## 2015-03-21 ENCOUNTER — Telehealth: Payer: Self-pay | Admitting: Internal Medicine

## 2015-03-21 NOTE — Telephone Encounter (Signed)
New Message  Alena the patients case worker with  [employer] [facility] [employer] [facility] called for the patient being that he speaks spanish only. She reports the Ready to schedule ICD implant. Would also like to discuss if it is covered by the orange card program. Please assist

## 2015-03-21 NOTE — Telephone Encounter (Signed)
Chronic systolic CHF (congestive heart failure) - Kyle Lance, MD at 03/18/2015 11:10 PM     Status: Written Related Problem: Chronic systolic CHF (congestive heart failure)   Expand All Collapse All   He has class 1 symptoms. He will continue his beta blocker and ACE inhibitor. I discussed ICD implant. His lack of heart failure makes ICD implant relatively contra-indicated. In addition, he is uninsured and stated that he could not afford surgery. His ventricular rate appears to be well controlled making a tachy induced CM less likely.        LMOM with the above information and that the patient may make an appointment again with an interpreter to discuss further if he did not understand

## 2015-04-07 ENCOUNTER — Encounter: Payer: Self-pay | Admitting: Internal Medicine

## 2015-04-07 DIAGNOSIS — R809 Proteinuria, unspecified: Secondary | ICD-10-CM | POA: Insufficient documentation

## 2015-04-07 DIAGNOSIS — N183 Chronic kidney disease, stage 3 unspecified: Secondary | ICD-10-CM | POA: Insufficient documentation

## 2015-04-07 DIAGNOSIS — E1122 Type 2 diabetes mellitus with diabetic chronic kidney disease: Secondary | ICD-10-CM | POA: Insufficient documentation

## 2015-04-07 DIAGNOSIS — E785 Hyperlipidemia, unspecified: Secondary | ICD-10-CM

## 2015-04-07 DIAGNOSIS — E1129 Type 2 diabetes mellitus with other diabetic kidney complication: Secondary | ICD-10-CM

## 2015-04-07 DIAGNOSIS — E119 Type 2 diabetes mellitus without complications: Secondary | ICD-10-CM

## 2015-04-07 DIAGNOSIS — D649 Anemia, unspecified: Secondary | ICD-10-CM

## 2015-05-30 ENCOUNTER — Ambulatory Visit (INDEPENDENT_AMBULATORY_CARE_PROVIDER_SITE_OTHER): Payer: Self-pay | Admitting: Internal Medicine

## 2015-05-30 ENCOUNTER — Encounter: Payer: Self-pay | Admitting: Internal Medicine

## 2015-05-30 VITALS — BP 142/88 | HR 68 | Ht 75.0 in | Wt 229.0 lb

## 2015-05-30 DIAGNOSIS — R002 Palpitations: Secondary | ICD-10-CM

## 2015-05-30 DIAGNOSIS — E119 Type 2 diabetes mellitus without complications: Secondary | ICD-10-CM

## 2015-05-30 LAB — GLUCOSE, POCT (MANUAL RESULT ENTRY)

## 2015-05-30 NOTE — Patient Instructions (Signed)
Take pulse for 1 full minute if you feel your heart is beating funny. Pulse should be between 60-100 at rest.   If your heart is beating fast and you feel dizzy or don't feel well, you need to go to ED immediately. These instructions interpreted for patient.

## 2015-05-30 NOTE — Progress Notes (Signed)
   Subjective:    Patient ID: Kyle Burke, male    DOB: [birth year], 69 y.o.   MRN: [id number]  HPI  Here due to palpitations for past 2-3 weeks.  Only has these when he lies down to sleep, in particular when lies on left side.  When asked if he feels like his heart is beating fast, slow or skipping a beat, he states his heart is beating fine--he doesn't think it's his heart beat.  Not clear if he feels his chest wall muscle is having uncontrolled movements.  Feels a "beep"  In his chest.  Only a single movement in anterolateral chest wall every 20 minutes when lying on left.  No pain with this.  No dyspnea.  No PND or orthopnea.  No LE swelling.   Has been working still in [employer] and no chest pain or dyspnea.      Review of Systems     Objective:   Physical Exam  Constitutional: He appears well-developed and well-nourished.  HENT:  Head: Normocephalic and atraumatic.  Eyes: EOM are normal. Pupils are equal, round, and reactive to light.  Neck: No thyromegaly present.  Cardiovascular: S1 normal, S2 normal and normal pulses.  An irregularly irregular rhythm present.  No murmur heard. Pulmonary/Chest:  When had patient roll over onto left chest, he immediately felt the sensation.   I easily felt his heart beating against the wall of his left chest in the area where he was symptomatic. His heart rate remained stable despite his symptoms.  He was not in distress as well.          Assessment & Plan:  1.  Palpitations:  Believe he is just feeling his heart beating against the inside wall of his chest.   To take his pulse for 1 full minute if he feels his heart is beating funny and call or be seen if not within 60-100 at rest.

## 2015-06-13 ENCOUNTER — Ambulatory Visit: Payer: No Typology Code available for payment source | Admitting: Cardiovascular Disease

## 2015-06-15 ENCOUNTER — Encounter: Payer: Self-pay | Admitting: Internal Medicine

## 2015-06-15 ENCOUNTER — Ambulatory Visit (INDEPENDENT_AMBULATORY_CARE_PROVIDER_SITE_OTHER): Payer: Self-pay | Admitting: Internal Medicine

## 2015-06-15 VITALS — BP 138/92 | HR 72 | Ht 75.0 in | Wt 226.0 lb

## 2015-06-15 DIAGNOSIS — R002 Palpitations: Secondary | ICD-10-CM

## 2015-06-15 DIAGNOSIS — E119 Type 2 diabetes mellitus without complications: Secondary | ICD-10-CM

## 2015-06-15 DIAGNOSIS — I10 Essential (primary) hypertension: Secondary | ICD-10-CM

## 2015-06-15 DIAGNOSIS — E785 Hyperlipidemia, unspecified: Secondary | ICD-10-CM

## 2015-06-15 LAB — GLUCOSE, POCT (MANUAL RESULT ENTRY): POC Glucose: 102 mg/dl — AB (ref 70–99)

## 2015-06-15 NOTE — Patient Instructions (Signed)
You are doing really well!

## 2015-06-15 NOTE — Progress Notes (Signed)
   Subjective:    Patient ID: Kyle Burke, male    DOB: 1945/12/14, 69 y.o.   MRN: [id number]  HPI   1.  Palpitations when lies on left chest:  Feel he is just more aware of his hearbeat when lies on left side.  He did check his pulse when he felt the palpitations since last visit and ranged from 56-64.  No new concerns with this.    2.  Dyslipidemia:  Last FLP was 09/15/2014 with cardiology with total cholesterol at 155, Triglycerides 58, HDL 39, and LDL 104.    3.  DM:  Mild microalbuminuria in June.  On Lisinopril. Highest sugar in recent weeks was 112, generally runs in 90s.  Last A1C in June was 5.6%.  Never has symptoms of low blood sugar. Continues Metformin twice daily.  4.  Hypertension:  BP still running 140/80 at home--still somewhat borderline and bp borderline today as was last OV.  Pt. Admits he has been unable to pick up his meds and was out of Lisinopril this morning.  5.  Cardiomyopathy:  Energy up and down.  Is still working Architect.  No chest pain, no edema, no dyspnea.  Review of Systems     Objective:   Physical Exam  Lungs:  CTA CV:  Irreg, irreg, No murmur or rub.  RAdial pulses normal and equal.  No ankle/foot edema       Assessment & Plan:  1.  Palpitations:  Feel this is just the pt. Picking up his hearbeat more easily as heart closer to chest wall when lies on left side.  2.  Dyslipidemia:  Recheck FLP in next few weeks when comes in for bp check  3  DM with microalbuminuria:  Very good control  4.  Hypertension:  Recheck bp when on medicine without missing dose.  Borderline, but would like to see 120/70 range.  HR limits increase of Carvedilol.  5.  Cardiomyopathy:  CHF compensated and stable.

## 2015-06-26 ENCOUNTER — Ambulatory Visit: Payer: Self-pay

## 2015-06-26 VITALS — BP 146/90 | HR 76

## 2015-06-26 DIAGNOSIS — E785 Hyperlipidemia, unspecified: Secondary | ICD-10-CM

## 2015-06-27 LAB — LIPID PANEL W/O CHOL/HDL RATIO
Cholesterol, Total: 151 mg/dL (ref 100–199)
HDL: 55 mg/dL (ref >39)
LDL CALC: 87 mg/dL (ref 0–99)
TRIGLYCERIDES: 43 mg/dL (ref 0–149)
VLDL CHOLESTEROL CAL: 9 mg/dL (ref 5–40)

## 2015-06-29 ENCOUNTER — Encounter: Payer: Self-pay | Admitting: Cardiovascular Disease

## 2015-06-29 NOTE — Progress Notes (Signed)
Quick Note:  06/29/15 Patient notified of results. ______

## 2015-08-06 ENCOUNTER — Ambulatory Visit (INDEPENDENT_AMBULATORY_CARE_PROVIDER_SITE_OTHER): Payer: Self-pay | Admitting: Internal Medicine

## 2015-08-06 ENCOUNTER — Encounter: Payer: Self-pay | Admitting: Internal Medicine

## 2015-08-06 VITALS — BP 142/90 | HR 61 | Ht 75.0 in | Wt 232.0 lb

## 2015-08-06 DIAGNOSIS — E119 Type 2 diabetes mellitus without complications: Secondary | ICD-10-CM

## 2015-08-06 DIAGNOSIS — I482 Chronic atrial fibrillation, unspecified: Secondary | ICD-10-CM

## 2015-08-06 DIAGNOSIS — R002 Palpitations: Secondary | ICD-10-CM

## 2015-08-06 LAB — GLUCOSE, POCT (MANUAL RESULT ENTRY): POC Glucose: 133 mg/dl — AB (ref 70–99)

## 2015-08-06 MED ORDER — CARVEDILOL 12.5 MG PO TABS: ORAL_TABLET | ORAL | Status: DC

## 2015-08-06 NOTE — Patient Instructions (Signed)
Tome Carvedilol 1 pastilla en la maana y 2 pastillas in la noche. Cheque su pulsa cada maana y escribe numero

## 2015-08-06 NOTE — Progress Notes (Signed)
   Subjective:    Patient ID: Jacinto Leva, male    DOB: 1946/03/11, 69 y.o.   MRN: [id number]  HPI   Palpitations:  Here again as he is feeling palpitations when he lies on left side or sometimes when lies on back--but always feels against his left chest.  Has not checked his pulse for a minute with these episodes.  Never notes any other time.  No weakness, light headedness, chest pain, or dyspnea with these episodes    Review of Systems     Objective:   Physical Exam NAD Chest:  CTA CV:  Irregularly irregular, Radial pulses equal, No lower extremity edema        Assessment & Plan:  1.  Palpitations:  Discussed he will always have an irregluar HB.  He appears to feel this when lying on left side as his heart drops against the inside chest wall then and he is more aware.  He does not appear to have any concerning symptoms with the palpitations.   Have asked him again to check his pulse for a minute when he is aware of his heartbeat.  2.  Hypertension:  Still too high despite previously increasing Lisinopril. HR is borderline at 61.  Will increase Carvedilol to 25 mg in the evening, he will check heart rate for 1 minute in morning and document.  Nurse visit follow up in 2 weeks with BP and pulse check.

## 2015-08-24 ENCOUNTER — Telehealth: Payer: Self-pay | Admitting: Internal Medicine

## 2015-08-24 NOTE — Telephone Encounter (Signed)
Patient called in his pulse readings for the past two weeks for Dr. Amil Amen to review. 08/07/15 Pulse 65 08/08/15 Pulse 68 08/09/15 Pulse 65 08/10/15 Pulse 67 08/11/15 Pulse 69 08/12/15 Pulse 68 08/13/15 Pulse 66 08/14/15 Pulse 67 08/15/15 Pulse 68 08/16/15 Pulse 65 08/17/15 Pulse 68 08/18/15 Pulse 67 08/19/15 Pulse 71 08/20/15 Pulse 64 08/21/15 Pulse 66 08/22/15 Not recorded 08/23/15 Not recorded 08/24/15 Pulse 68

## 2015-08-28 ENCOUNTER — Ambulatory Visit (INDEPENDENT_AMBULATORY_CARE_PROVIDER_SITE_OTHER): Payer: No Typology Code available for payment source | Admitting: Cardiovascular Disease

## 2015-08-28 ENCOUNTER — Encounter: Payer: Self-pay | Admitting: Cardiovascular Disease

## 2015-08-28 VITALS — BP 144/90 | HR 64 | Ht 75.0 in | Wt 225.0 lb

## 2015-08-28 DIAGNOSIS — I5022 Chronic systolic (congestive) heart failure: Secondary | ICD-10-CM

## 2015-08-28 DIAGNOSIS — I482 Chronic atrial fibrillation, unspecified: Secondary | ICD-10-CM

## 2015-08-28 MED ORDER — SPIRONOLACTONE 25 MG PO TABS: 12.5000 mg | ORAL_TABLET | Freq: Every day | ORAL | Status: DC

## 2015-08-28 MED ORDER — RIVAROXABAN 20 MG PO TABS: 20.0000 mg | ORAL_TABLET | Freq: Every day | ORAL | Status: DC

## 2015-08-28 NOTE — Progress Notes (Signed)
Cardiology Office Note   Date:  08/28/2015   ID:  Duel, Guderian 1946-04-07, MRN [id number]  PCP:  Mack Hook, MD  Cardiologist:   Thayer Headings, MD   Chief Complaint  Patient presents with  . Follow-up   Problem List 1. Atrial fibrillation 2. CHF  - EF  25% 3. LBBB   History of Present Illness: Kyle Burke is a 70 y.o. male orginially from [place], who presents for evaluation of newly diagnosed atrial fib.   The patient speaks some english.  No intrepreter was available.  Was an urgent work in from [employer] - Dr. Amil Amen. He presented to Dr. Mahala Menghini with palpitations in his left chest.  Was found to have a-fib and was referred to [place].  No CP , no dyspnea.  Works in Loss adjuster, [employer].  Non smoker Drinks beer occasionally   Feb. 3, 2015:  Kyle Burke is feeling better.  Is seen with intrepreter , [name].   We performed an echocardiogram which reveals severe left ventricle systolic dysfunction. His ejection fraction is 25-30%. He was referred to [place] several weeks ago for evaluatino of A-fib with RVR We started him on Xarelto, metooprolol,  He had some slight dizziness when he first started the metoprolol but this is largely resolved. He remains on diltiazem.  He is still at work . Breathing is much better.   October 19, 2014:  Pt is seen today with intrepreter - [name].  He did not tolerate the metoprolol - stopped it on his own.  Dr. Tawni Carnes restarted Diltiazem 240  December 15, 2014; Pt was seen with intrepreter, [name].   He is doing well.  Working 4 times a week.  Also works in his garden on his off days.  Has some pain in his legs. Not cramping   June 29 , 2016:  Seen with Intrepreter, [name].  Breathing is ok.  Generally feeling stronger since we have started the CHF meds. Has occasional palpitations at night  Still able to work 4 times a week.   Jan. 10, 2017: Kyle Burke is doing well .  Seen with  [name] ( Interpreter )  No CP or dyspnea.  Occasional palpitations,  Only feels it occasionally .  Tolerating his meds well Takes his meds every day     Past Medical History  Diagnosis Date  . Diabetes mellitus 2001  . Hypertension 2006    Past Surgical History  Procedure Laterality Date  . Incise and drain abcess  1986    on leg  . Inguinal hernia repair  01/16/2012    Procedure: LAPAROSCOPIC INGUINAL HERNIA;  Surgeon: Gayland Curry, MD,FACS;  Location: [facility];  Service: General;  Laterality: Left;  Laparoscopic incarcerated Left inguinal hernia repair with mesh, umbilical hernia repair  . Umbilical hernia repair  01/16/2012    Procedure: HERNIA REPAIR UMBILICAL ADULT;  Surgeon: Gayland Curry, MD,FACS;  Location: [facility];  Service: General;  Laterality: N/A;     Current Outpatient Prescriptions  Medication Sig Dispense Refill  . carvedilol (COREG) 12.5 MG tablet Take 1 tab by mouth in the morning and 2 tabs by mouth in the evening. 90 tablet 11  . furosemide (LASIX) 40 MG tablet Take 20 mg by mouth daily.    Marland Kitchen lisinopril (PRINIVIL,ZESTRIL) 40 MG tablet Take 40 mg by mouth daily.    . metFORMIN (GLUCOPHAGE) 500 MG tablet Take 500 mg by mouth 2 (two) times daily with a meal.    .  potassium chloride SA (K-DUR,KLOR-CON) 20 MEQ tablet Take 1 tablet (20 mEq total) by mouth daily. 90 tablet 3  . rivaroxaban (XARELTO) 20 MG TABS tablet Take 20 mg by mouth daily with supper.     No current facility-administered medications for this visit.    Allergies:   Review of patient's allergies indicates no known allergies.    Social History:  The patient  reports that he has never smoked. He has never used smokeless tobacco. He reports that he does not drink alcohol or use illicit drugs.   Family History:  The patient's family history includes Cancer (age of onset: 63) in his sister; Diabetes in his brother and father; Heart disease in his father; Kidney disease in his father; Stroke in his  mother.    ROS:  Please see the history of present illness.   Otherwise, review of systems are positive for none.   All other systems are reviewed and negative.    PHYSICAL EXAM: VS:  BP 144/90 mmHg  Pulse 64  Ht 6\' 3"  (1.905 m)  Wt 225 lb (102.059 kg)  BMI 28.12 kg/m2 , BMI Body mass index is 28.12 kg/(m^2). GEN: Well nourished, well developed, in no acute distress HEENT: normal Neck: no JVD, carotid bruits, or masses Cardiac: Irreg. Irreg. ; soft systolic  Murmur. No , rubs, or gallops,no edema  Respiratory:  clear to auscultation bilaterally, normal work of breathing GI: soft, nontender, nondistended, + BS MS: no deformity or atrophy Skin: warm and dry, no rash Neuro:  Strength and sensation are intact Psych: euthymic mood, full affect   EKG:  EKG is not ordered today.    Recent Labs: 09/15/2014: ALT 149*; Hemoglobin 12.7*; Platelets 246; TSH 1.473 12/15/2014: BUN 20; Creatinine, Ser 0.77; Potassium 4.0; Sodium 137    Lipid Panel    Component Value Date/Time   CHOL 151 06/26/2015 1104   CHOL 155 09/15/2014 1037   TRIG 43 06/26/2015 1104   HDL 55 06/26/2015 1104   HDL 39* 09/15/2014 1037   CHOLHDL 4.0 09/15/2014 1037   VLDL 12 09/15/2014 1037   LDLCALC 87 06/26/2015 1104   LDLCALC 104* 09/15/2014 1037      Wt Readings from Last 3 Encounters:  08/28/15 225 lb (102.059 kg)  08/06/15 232 lb (105.235 kg)  06/15/15 226 lb (102.513 kg)      Other studies Reviewed: Additional studies/ records that were reviewed today include: records from Dr. Tawni Carnes and Lovena Le  Review of the above records demonstrates: atrial fib    ASSESSMENT AND PLAN:  1.  Atrial fib with RVR.   His atrial fib rate is well controlled.  Continue current meds.   2. Chronic systolic congestive heart failure:  I have reviewed Dr. Tanna Furry notes.   Was thought not to be a good candidate for ICD placement because of lack of CHF symptoms   The patient was found have an EF of around 25%.     Doing well on coreg. Lisinoopril. Lasix.  Will get an echo to reassess his LV function.  Will add Aldactone 12. 5 mg a day  BMP in 1 week and in 3 months   Will see him in 3 months  .   Current medicines are reviewed at length with the patient today.  The patient does not have concerns regarding medicines. .    Labs/ tests ordered today include:   No orders of the defined types were placed in this encounter.    Disposition:   FU  with me  in 6  month   Signed, Olden Klauer, Wonda Cheng, MD  08/28/2015 9:03 AM    [facility] [address], [place], [place]  [address] Phone: [phone]; Fax: [phone]

## 2015-08-28 NOTE — Patient Instructions (Signed)
Medication Instructions:  START Aldactone (spironolactone) 12.5 mg once daily   Labwork: Your physician recommends that you return for lab work in: 1 week for basic metabolic panel   Testing/Procedures: None Ordered   Follow-Up: Your physician wants you to follow-up in: 3 months with Dr. Acie Fredrickson.  You will receive a reminder letter in the mail two months in advance. If you don't receive a letter, please call our office to schedule the follow-up appointment.   If you need a refill on your cardiac medications before your next appointment, please call your pharmacy.   Thank you for choosing [facility]! Christen Bame, RN [phone]

## 2015-08-29 NOTE — Telephone Encounter (Signed)
Please notify patient that those pulses are fine and thanks for calling in. BP check with pulse with Mechele Claude I think next week.

## 2015-09-04 ENCOUNTER — Other Ambulatory Visit (INDEPENDENT_AMBULATORY_CARE_PROVIDER_SITE_OTHER): Payer: No Typology Code available for payment source | Admitting: *Deleted

## 2015-09-04 DIAGNOSIS — I10 Essential (primary) hypertension: Secondary | ICD-10-CM

## 2015-09-04 LAB — BASIC METABOLIC PANEL
BUN: 19 mg/dL (ref 7–25)
CALCIUM: 9.3 mg/dL (ref 8.6–10.3)
CO2: 25 mmol/L (ref 20–31)
Chloride: 105 mmol/L (ref 98–110)
Creat: 0.9 mg/dL (ref 0.70–1.25)
Glucose, Bld: 87 mg/dL (ref 65–99)
POTASSIUM: 4.1 mmol/L (ref 3.5–5.3)
SODIUM: 140 mmol/L (ref 135–146)

## 2015-09-26 NOTE — Telephone Encounter (Signed)
Patient notified

## 2015-10-04 ENCOUNTER — Other Ambulatory Visit: Payer: Self-pay | Admitting: Internal Medicine

## 2015-10-04 DIAGNOSIS — I482 Chronic atrial fibrillation, unspecified: Secondary | ICD-10-CM

## 2015-10-04 DIAGNOSIS — I10 Essential (primary) hypertension: Secondary | ICD-10-CM

## 2015-10-04 MED ORDER — LISINOPRIL 40 MG PO TABS: 40.0000 mg | ORAL_TABLET | Freq: Every day | ORAL | Status: DC

## 2015-10-04 MED ORDER — RIVAROXABAN 20 MG PO TABS: 20.0000 mg | ORAL_TABLET | Freq: Every day | ORAL | Status: DC

## 2015-10-11 ENCOUNTER — Encounter: Payer: No Typology Code available for payment source | Admitting: Internal Medicine

## 2015-10-16 ENCOUNTER — Telehealth: Payer: Self-pay | Admitting: Cardiovascular Disease

## 2015-10-16 NOTE — Telephone Encounter (Signed)
Samples placed at the front desk for patient. 

## 2015-10-16 NOTE — Telephone Encounter (Signed)
Patient calling the office for samples of medication:   1.  What medication and dosage are you requesting samples for?-Xarelto 20 mg-Pt is there waiting at [facility] office  2.  Are you currently out of this medication? No-just enough until Friday-He will not be able to get his prescription for about 3 weeks

## 2015-10-26 ENCOUNTER — Ambulatory Visit (INDEPENDENT_AMBULATORY_CARE_PROVIDER_SITE_OTHER): Payer: Self-pay

## 2015-10-26 DIAGNOSIS — Z23 Encounter for immunization: Secondary | ICD-10-CM

## 2015-11-08 ENCOUNTER — Encounter: Payer: Self-pay | Admitting: Internal Medicine

## 2015-11-08 ENCOUNTER — Telehealth: Payer: Self-pay | Admitting: Cardiovascular Disease

## 2015-11-08 ENCOUNTER — Ambulatory Visit (INDEPENDENT_AMBULATORY_CARE_PROVIDER_SITE_OTHER): Payer: Self-pay | Admitting: Internal Medicine

## 2015-11-08 VITALS — BP 138/88 | HR 68 | Ht 75.0 in | Wt 228.0 lb

## 2015-11-08 DIAGNOSIS — Z0000 Encounter for general adult medical examination without abnormal findings: Secondary | ICD-10-CM

## 2015-11-08 DIAGNOSIS — Z79899 Other long term (current) drug therapy: Secondary | ICD-10-CM

## 2015-11-08 DIAGNOSIS — E119 Type 2 diabetes mellitus without complications: Secondary | ICD-10-CM

## 2015-11-08 LAB — GLUCOSE, POCT (MANUAL RESULT ENTRY): POC Glucose: 101 mg/dl — AB (ref 70–99)

## 2015-11-08 NOTE — Telephone Encounter (Signed)
Patient calling the office for samples of medication:   1.  What medication and dosage are you requesting samples for?Xarelto  2.  Are you currently out of this medication? No,1 left

## 2015-11-08 NOTE — Patient Instructions (Addendum)
Call [facility] for Pneumovax

## 2015-11-08 NOTE — Progress Notes (Signed)
Subjective:    Patient ID: Kyle Burke, male    DOB: 1946/07/01, 70 y.o.   MRN: [id number]  HPI   Here for CPE   Health Maintenance:  1.  Immunizations:  Influenza up to date.  Feels he had a tetanus within past 10 years.  Does not believe he has had Pneumovax.  2.  Colon Cancer screening:  Has done stool cards in years past, but not colonoscopy.  3.  PSA:  Last  Checked in 2011--normal.  Past Medical History  Diagnosis Date  . Diabetes mellitus 2001  . Hypertension 2006  . 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 Acie Fredrickson, Cardiology  EF 25%  . Atrial fibrillation ([place]) 2016     Past Surgical History  Procedure Laterality Date  . Incise and drain abcess  1986    on leg  . Inguinal hernia repair  01/16/2012    Procedure: LAPAROSCOPIC INGUINAL HERNIA;  Surgeon: Gayland Curry, MD,FACS;  Location[facility];  Service: General;  Laterality: Left;  Laparoscopic incarcerated Left inguinal hernia repair with mesh, umbilical hernia repair  . Umbilical hernia repair  01/16/2012    Procedure: HERNIA REPAIR UMBILICAL ADULT;  Surgeon: Gayland Curry, MD,FACS;  Location: [facility];  Service: General;  Laterality: N/A;   Family History  Problem Relation Age of Onset  . Stroke Mother   . Heart disease Father   . Diabetes Father   . Kidney disease Father     , history of stones as well  . Diabetes Brother   . Cancer Sister 69    unknown soft tissue cancer of leg     Current outpatient prescriptions:  .  carvedilol (COREG) 12.5 MG tablet, Take 1 tab by mouth in the morning and 2 tabs by mouth in the evening., Disp: 90 tablet, Rfl: 11 .  furosemide (LASIX) 40 MG tablet, Take 20 mg by mouth daily., Disp: , Rfl:  .  lisinopril (PRINIVIL,ZESTRIL) 40 MG tablet, Take 1 tablet (40 mg total) by mouth daily., Disp: 30 tablet, Rfl: 11 .  metFORMIN (GLUCOPHAGE) 500 MG tablet, Take 500 mg by mouth 2 (two) times daily with a meal., Disp: , Rfl:  .  potassium chloride SA  (K-DUR,KLOR-CON) 20 MEQ tablet, Take 1 tablet (20 mEq total) by mouth daily., Disp: 90 tablet, Rfl: 3 .  rivaroxaban (XARELTO) 20 MG TABS tablet, Take 1 tablet (20 mg total) by mouth daily with supper., Disp: 30 tablet, Rfl: 11 .  spironolactone (ALDACTONE) 25 MG tablet, Take 0.5 tablets (12.5 mg total) by mouth daily., Disp: 45 tablet, Rfl: 3   No Known Allergies    Review of Systems  Constitutional:       Energy is okay  HENT: Negative for congestion, dental problem and hearing loss.   Eyes: Negative for visual disturbance.       Wears glasses at times with reading.  Respiratory: Negative for shortness of breath.   Cardiovascular: Positive for palpitations. Negative for chest pain and leg swelling.       Still with palpitations mainly when lying on left side.  Gastrointestinal: Positive for constipation. Negative for nausea, abdominal pain, diarrhea and blood in stool.       No melena  Endocrine: Negative for polydipsia and polyuria.       Does not urinate much during day--nocturia x3.  Has good urine flow.  No urinary hesitancy.  Genitourinary: Negative for frequency, difficulty urinating, penile pain and testicular pain.  Musculoskeletal:  Negative for joint swelling and arthralgias.       Tendons of distal thigh right more than left bother him from time to time.  Skin:       No skin lesions with change in color, bleeding, growth  Neurological: Negative for dizziness, weakness, numbness and headaches.  Psychiatric/Behavioral: Negative for dysphoric mood. The patient is not nervous/anxious.        Objective:   Physical Exam  Constitutional: He is oriented to person, place, and time. He appears well-developed and well-nourished.  HENT:  Head: Normocephalic and atraumatic.  Right Ear: Hearing, tympanic membrane, external ear and ear canal normal.  Left Ear: Hearing, tympanic membrane, external ear and ear canal normal.  Nose: Nose normal.  Mouth/Throat: Uvula is midline,  oropharynx is clear and moist and mucous membranes are normal.  Eyes: Conjunctivae and EOM are normal. Pupils are equal, round, and reactive to light.  Discs sharp bilaterally  Neck: Normal range of motion. Neck supple. No thyroid mass and no thyromegaly present.  Cardiovascular: Normal rate, S1 normal, S2 normal and normal pulses.  An irregularly irregular rhythm present. Exam reveals no gallop and no friction rub.   No murmur heard. Pulmonary/Chest: Effort normal and breath sounds normal. He exhibits no tenderness.  Abdominal: Soft. Bowel sounds are normal. He exhibits no mass. There is no hepatosplenomegaly. There is no tenderness. Hernia confirmed negative in the right inguinal area and confirmed negative in the left inguinal area.  Genitourinary: Rectum normal, prostate normal and penis normal. Rectal exam shows no mass and no tenderness. Guaiac negative stool. Prostate is not tender. Right testis shows no mass and no tenderness. Left testis shows no mass and no tenderness. No penile tenderness.  Musculoskeletal: Normal range of motion.  Varus angulation of bilateral knees, no effusion or erythema.  Hypertrophic appearance bilaterally  Lymphadenopathy:       Head (right side): No submental and no submandibular adenopathy present.       Head (left side): No submental and no submandibular adenopathy present.    He has no cervical adenopathy.    He has no axillary adenopathy.       Right: No inguinal adenopathy present.       Left: No inguinal adenopathy present.  Neurological: He is alert and oriented to person, place, and time. He has normal strength and normal reflexes. No cranial nerve deficit or sensory deficit. Coordination normal.  Skin: Skin is warm and dry.  Psychiatric: He has a normal mood and affect. His speech is normal and behavior is normal. Judgment and thought content normal.          Assessment & Plan:   CPE  Check CBC, CMP, PSA, A1C Hemoccult cards x3 to take home,  complete and return in 2 weeks. No source for low cost screening colonoscopy at this time Recommend obtaining Pneumovax at [facility] Patient doing remarkably well despite cardiac issues. Diabetes has remained well controlled

## 2015-11-08 NOTE — Telephone Encounter (Addendum)
Spoke with Colletta Maryland at Dr. Melissa Noon office and advised her that I will place Xarelto samples at the front desk for the patient to pick up between 8 am and 5 pm tomorrow.  She thanked me for the call.

## 2015-11-09 LAB — CBC WITH DIFFERENTIAL/PLATELET
BASOS: 0 %
Basophils Absolute: 0 10*3/uL (ref 0.0–0.2)
EOS (ABSOLUTE): 0.1 10*3/uL (ref 0.0–0.4)
EOS: 2 %
Hematocrit: 37.5 % (ref 37.5–51.0)
Hemoglobin: 12.5 g/dL — ABNORMAL LOW (ref 12.6–17.7)
IMMATURE GRANS (ABS): 0 10*3/uL (ref 0.0–0.1)
IMMATURE GRANULOCYTES: 0 %
LYMPHS: 29 %
Lymphocytes Absolute: 1.7 10*3/uL (ref 0.7–3.1)
MCH: 28.8 pg (ref 26.6–33.0)
MCHC: 33.3 g/dL (ref 31.5–35.7)
MCV: 86 fL (ref 79–97)
Monocytes Absolute: 0.4 10*3/uL (ref 0.1–0.9)
Monocytes: 7 %
NEUTROS PCT: 62 %
Neutrophils Absolute: 3.5 10*3/uL (ref 1.4–7.0)
PLATELETS: 177 10*3/uL (ref 150–379)
RBC: 4.34 x10E6/uL (ref 4.14–5.80)
RDW: 14.8 % (ref 12.3–15.4)
WBC: 5.7 10*3/uL (ref 3.4–10.8)

## 2015-11-09 LAB — COMPREHENSIVE METABOLIC PANEL
A/G RATIO: 1.6 (ref 1.2–2.2)
ALK PHOS: 98 IU/L (ref 39–117)
ALT: 32 IU/L (ref 0–44)
AST: 32 IU/L (ref 0–40)
Albumin: 4.4 g/dL (ref 3.6–4.8)
BILIRUBIN TOTAL: 1 mg/dL (ref 0.0–1.2)
BUN/Creatinine Ratio: 25 — ABNORMAL HIGH (ref 10–22)
BUN: 25 mg/dL (ref 8–27)
CHLORIDE: 99 mmol/L (ref 96–106)
CO2: 24 mmol/L (ref 18–29)
Calcium: 9.4 mg/dL (ref 8.6–10.2)
Creatinine, Ser: 1.01 mg/dL (ref 0.76–1.27)
GFR calc Af Amer: 88 mL/min/{1.73_m2} (ref >59)
GFR calc non Af Amer: 76 mL/min/{1.73_m2} (ref >59)
GLOBULIN, TOTAL: 2.8 g/dL (ref 1.5–4.5)
Glucose: 97 mg/dL (ref 65–99)
POTASSIUM: 5 mmol/L (ref 3.5–5.2)
SODIUM: 139 mmol/L (ref 134–144)
Total Protein: 7.2 g/dL (ref 6.0–8.5)

## 2015-11-09 LAB — PSA: PROSTATE SPECIFIC AG, SERUM: 1.3 ng/mL (ref 0.0–4.0)

## 2015-11-09 LAB — HGB A1C W/O EAG: Hgb A1c MFr Bld: 6.3 % — ABNORMAL HIGH (ref 4.8–5.6)

## 2015-11-20 ENCOUNTER — Other Ambulatory Visit (INDEPENDENT_AMBULATORY_CARE_PROVIDER_SITE_OTHER): Payer: Self-pay | Admitting: Internal Medicine

## 2015-11-20 ENCOUNTER — Encounter: Payer: Self-pay | Admitting: Internal Medicine

## 2015-11-20 ENCOUNTER — Telehealth: Payer: Self-pay | Admitting: Internal Medicine

## 2015-11-20 DIAGNOSIS — Z1211 Encounter for screening for malignant neoplasm of colon: Secondary | ICD-10-CM

## 2015-11-20 LAB — POC HEMOCCULT BLD/STL (HOME/3-CARD/SCREEN)
Card #3 Fecal Occult Blood, POC: NEGATIVE
FECAL OCCULT BLD: NEGATIVE
Fecal Occult Blood, POC: NEGATIVE

## 2015-11-22 NOTE — Telephone Encounter (Signed)
Left voice message to return call 

## 2015-11-28 NOTE — Telephone Encounter (Signed)
Patient informed for results

## 2015-12-05 ENCOUNTER — Telehealth: Payer: Self-pay | Admitting: Internal Medicine

## 2015-12-05 ENCOUNTER — Other Ambulatory Visit: Payer: Self-pay | Admitting: *Deleted

## 2015-12-05 ENCOUNTER — Ambulatory Visit (INDEPENDENT_AMBULATORY_CARE_PROVIDER_SITE_OTHER): Payer: No Typology Code available for payment source | Admitting: Cardiovascular Disease

## 2015-12-05 ENCOUNTER — Encounter: Payer: Self-pay | Admitting: Cardiovascular Disease

## 2015-12-05 VITALS — BP 124/74 | HR 60 | Ht 75.0 in | Wt 229.8 lb

## 2015-12-05 DIAGNOSIS — I482 Chronic atrial fibrillation, unspecified: Secondary | ICD-10-CM

## 2015-12-05 DIAGNOSIS — I5022 Chronic systolic (congestive) heart failure: Secondary | ICD-10-CM

## 2015-12-05 MED ORDER — SPIRONOLACTONE 25 MG PO TABS: 25.0000 mg | ORAL_TABLET | Freq: Every day | ORAL | Status: DC

## 2015-12-05 MED ORDER — POTASSIUM CHLORIDE CRYS ER 20 MEQ PO TBCR: 10.0000 meq | EXTENDED_RELEASE_TABLET | Freq: Every day | ORAL | Status: DC

## 2015-12-05 MED ORDER — POTASSIUM CHLORIDE ER 10 MEQ PO TBCR: 10.0000 meq | EXTENDED_RELEASE_TABLET | Freq: Every day | ORAL | Status: DC

## 2015-12-05 MED ORDER — RIVAROXABAN 20 MG PO TABS: 20.0000 mg | ORAL_TABLET | Freq: Every day | ORAL | Status: DC

## 2015-12-05 NOTE — Telephone Encounter (Signed)
potassium chloride SA (K-DUR,KLOR-CON) 20 MEQ tablet  Medication   Date: 12/05/2015  Department: [address] Office  Ordering/Authorizing: Thayer Headings, MD      Order Providers    Prescribing Provider Encounter Provider   Thayer Headings, MD Thayer Headings, MD    Medication Detail      Disp Refills Start End     potassium chloride SA (K-DUR,KLOR-CON) 20 MEQ tablet 90 tablet 3 12/05/2015     Sig - Route: Take 0.5 tablets (10 mEq total) by mouth daily. - Oral    Class: No Print     Pharmacy    [place], [facility]   Will resend

## 2015-12-05 NOTE — Progress Notes (Signed)
Cardiology Office Note   Date:  12/05/2015   ID:  Kyle Burke, Kyle Burke 02-17-1946, MRN [id number]  PCP:  Mack Hook, MD  Cardiologist:   Thayer Headings, MD   Chief Complaint  Patient presents with  . Follow-up   Problem List 1. Atrial fibrillation 2. CHF  - EF  25% 3. LBBB 4. Diabetes Mellitus    History of Present Illness: Kyle Burke is a 70 y.o. male orginially from [place], who presents for evaluation of newly diagnosed atrial fib.   The patient speaks some english.  No intrepreter was available.  Was an urgent work in from [employer] - Dr. Amil Amen. He presented to Dr. Mahala Menghini with palpitations in his left chest.  Was found to have a-fib and was referred to [place].  No CP , no dyspnea.  Works in Loss adjuster, chartered.  Non smoker Drinks beer occasionally   Feb. 3, 2015:  Kyle Burke is feeling better.  Is seen with intrepreter , [name].   We performed an echocardiogram which reveals severe left ventricle systolic dysfunction. His ejection fraction is 25-30%. He was referred to [place] several weeks ago for evaluatino of A-fib with RVR We started him on Xarelto, metooprolol,  He had some slight dizziness when he first started the metoprolol but this is largely resolved. He remains on diltiazem.  He is still at work . Breathing is much better.   October 19, 2014:  Pt is seen today with intrepreter - [name].  He did not tolerate the metoprolol - stopped it on his own.  Dr. Tawni Carnes restarted Diltiazem 240  December 15, 2014; Pt was seen with intrepreter, [name].   He is doing well.  Working 4 times a week.  Also works in his garden on his off days.  Has some pain in his legs. Not cramping   June 29 , 2016:  Seen with Intrepreter, [name].  Breathing is ok.  Generally feeling stronger since we have started the CHF meds. Has occasional palpitations at night  Still able to work 4 times a week.   Jan. 10, 2017: Kyle Burke is  doing well .  Seen with [name] ( Interpreter )  No CP or dyspnea.  Occasional palpitations,  Only feels it occasionally .  Tolerating his meds well Takes his meds every day   December 05, 2015 Seen with Kyle Burke [facility] )  Staying active.  Working , no CP or dyspnea.   Past Medical History  Diagnosis Date  . Diabetes mellitus 2001  . Hypertension 2006  . Idiopathic cardiomyopathy ([place]) 11/01/2014    Followed by Dr. Acie Fredrickson, Cardiology  EF 25%  Adenosine Cardiolite did not support ischemic etiology  . Atrial fibrillation ([place]) 2016    Past Surgical History  Procedure Laterality Date  . Incise and drain abcess  1986    on leg  . Inguinal hernia repair  01/16/2012    Procedure: LAPAROSCOPIC INGUINAL HERNIA;  Surgeon: Gayland Curry, MD,FACS;  Location: [facility];  Service: General;  Laterality: Left;  Laparoscopic incarcerated Left inguinal hernia repair with mesh, umbilical hernia repair  . Umbilical hernia repair  01/16/2012    Procedure: HERNIA REPAIR UMBILICAL ADULT;  Surgeon: Gayland Curry, MD,FACS;  Location: [facility];  Service: General;  Laterality: N/A;     Current Outpatient Prescriptions  Medication Sig Dispense Refill  . carvedilol (COREG) 12.5 MG tablet Take 1 tab by mouth in the morning and 2 tabs by mouth in the  evening. 90 tablet 11  . furosemide (LASIX) 40 MG tablet Take 20 mg by mouth daily.    Marland Kitchen lisinopril (PRINIVIL,ZESTRIL) 40 MG tablet Take 1 tablet (40 mg total) by mouth daily. 30 tablet 11  . metFORMIN (GLUCOPHAGE) 500 MG tablet Take 500 mg by mouth 2 (two) times daily with a meal.    . potassium chloride SA (K-DUR,KLOR-CON) 20 MEQ tablet Take 1 tablet (20 mEq total) by mouth daily. 90 tablet 3  . rivaroxaban (XARELTO) 20 MG TABS tablet Take 1 tablet (20 mg total) by mouth daily with supper. 30 tablet 11  . spironolactone (ALDACTONE) 25 MG tablet Take 0.5 tablets (12.5 mg total) by mouth daily. 45 tablet 3   No current facility-administered medications  for this visit.    Allergies:   Review of patient's allergies indicates no known allergies.    Social History:  The patient  reports that he has never smoked. He has never used smokeless tobacco. He reports that he does not drink alcohol or use illicit drugs.   Family History:  The patient's family history includes Cancer (age of onset: 43) in his sister; Diabetes in his brother and father; Heart disease in his father; Kidney disease in his father; Stroke in his mother.    ROS:  Please see the history of present illness.   Otherwise, review of systems are positive for none.   All other systems are reviewed and negative.    PHYSICAL EXAM: VS:  BP 124/74 mmHg  Pulse 60  Ht 6\' 3"  (1.905 m)  Wt 229 lb 12.8 oz (104.237 kg)  BMI 28.72 kg/m2 , BMI Body mass index is 28.72 kg/(m^2). GEN: Well nourished, well developed, in no acute distress HEENT: normal Neck: no JVD, carotid bruits, or masses Cardiac: Irreg. Irreg. ; soft systolic  Murmur. No , rubs, or gallops,no edema  Respiratory:  clear to auscultation bilaterally, normal work of breathing GI: soft, nontender, nondistended, + BS MS: no deformity or atrophy Skin: warm and dry, no rash Neuro:  Strength and sensation are intact Psych: euthymic mood, full affect   EKG:  EKG is not ordered today.    Recent Labs: 11/08/2015: ALT 32; BUN 25; Creatinine, Ser 1.01; Platelets 177; Potassium 5.0; Sodium 139    Lipid Panel    Component Value Date/Time   CHOL 151 06/26/2015 1104   CHOL 155 09/15/2014 1037   TRIG 43 06/26/2015 1104   HDL 55 06/26/2015 1104   HDL 39* 09/15/2014 1037   CHOLHDL 4.0 09/15/2014 1037   VLDL 12 09/15/2014 1037   LDLCALC 87 06/26/2015 1104   LDLCALC 104* 09/15/2014 1037      Wt Readings from Last 3 Encounters:  12/05/15 229 lb 12.8 oz (104.237 kg)  11/08/15 228 lb (103.42 kg)  08/28/15 225 lb (102.059 kg)      Other studies Reviewed: Additional studies/ records that were reviewed today include:  records from Dr. Tawni Carnes and Lovena Le  Review of the above records demonstrates: atrial fib    ASSESSMENT AND PLAN:  1.  Atrial fib with RVR.   His atrial fib rate is well controlled.  Continue current meds.  Continue xarelto - he gets at [facility]  2. Chronic systolic congestive heart failure:  I have reviewed Dr.  Hillery Jacks notes.   Was thought not to be a good candidate for ICD placement because of lack of CHF symptoms.    The patient was found have an EF of around 25%.    Doing  well on coreg. Lisinoopril. Lasix.  Will get an echo to reassess his LV function.  Will increase  Aldactone  To 25 mg a day .  Will continue aggressive medical management.   Decrease Kdur to 10 meq a day .  BMP in 3 weeks   Will see him in 3 months  .   Current medicines are reviewed at length with the patient today.  The patient does not have concerns regarding medicines. .    Labs/ tests ordered today include:   No orders of the defined types were placed in this encounter.    Disposition:   FU with me  in 6  month   Signed, Jeanise Durfey, Wonda Cheng, MD  12/05/2015 11:33 AM    [facility] [place], [place], [place]  [address] Phone: [phone]; Fax: [phone]

## 2015-12-05 NOTE — Addendum Note (Signed)
Addended by: Emmaline Life on: 12/05/2015 01:36 PM   Modules accepted: Orders, Medications

## 2015-12-05 NOTE — Patient Instructions (Signed)
Medication Instructions:  INCREASE Aldactone to 25 mg once daily DECREASE Kdur (potassium) to 10 meq once daily   Labwork: Your physician recommends that you return for lab work in: 3 weeks for basic metabolic panel   Testing/Procedures: Your physician has requested that you have an echocardiogram. Echocardiography is a painless test that uses sound waves to create images of your heart. It provides your doctor with information about the size and shape of your heart and how well your heart's chambers and valves are working. This procedure takes approximately one hour. There are no restrictions for this procedure.   Follow-Up: Your physician recommends that you schedule a follow-up appointment in: 3 months with Dr. Acie Fredrickson   If you need a refill on your cardiac medications before your next appointment, please call your pharmacy.   Thank you for choosing [facility]! Christen Bame, RN [phone]

## 2015-12-25 ENCOUNTER — Other Ambulatory Visit: Payer: Self-pay | Admitting: *Deleted

## 2015-12-25 MED ORDER — FUROSEMIDE 40 MG PO TABS: 20.0000 mg | ORAL_TABLET | Freq: Every day | ORAL | Status: DC

## 2015-12-26 ENCOUNTER — Encounter (HOSPITAL_COMMUNITY): Payer: Self-pay | Admitting: *Deleted

## 2015-12-26 ENCOUNTER — Other Ambulatory Visit: Payer: Self-pay

## 2015-12-26 ENCOUNTER — Other Ambulatory Visit (INDEPENDENT_AMBULATORY_CARE_PROVIDER_SITE_OTHER): Payer: No Typology Code available for payment source | Admitting: *Deleted

## 2015-12-26 ENCOUNTER — Ambulatory Visit (HOSPITAL_COMMUNITY): Payer: No Typology Code available for payment source | Attending: Internal Medicine

## 2015-12-26 DIAGNOSIS — E119 Type 2 diabetes mellitus without complications: Secondary | ICD-10-CM | POA: Insufficient documentation

## 2015-12-26 DIAGNOSIS — I482 Chronic atrial fibrillation, unspecified: Secondary | ICD-10-CM

## 2015-12-26 DIAGNOSIS — I517 Cardiomegaly: Secondary | ICD-10-CM | POA: Insufficient documentation

## 2015-12-26 DIAGNOSIS — I071 Rheumatic tricuspid insufficiency: Secondary | ICD-10-CM | POA: Insufficient documentation

## 2015-12-26 DIAGNOSIS — I351 Nonrheumatic aortic (valve) insufficiency: Secondary | ICD-10-CM | POA: Insufficient documentation

## 2015-12-26 DIAGNOSIS — I5022 Chronic systolic (congestive) heart failure: Secondary | ICD-10-CM

## 2015-12-26 DIAGNOSIS — I340 Nonrheumatic mitral (valve) insufficiency: Secondary | ICD-10-CM | POA: Insufficient documentation

## 2015-12-26 DIAGNOSIS — Q211 Atrial septal defect: Secondary | ICD-10-CM | POA: Insufficient documentation

## 2015-12-26 LAB — BASIC METABOLIC PANEL
BUN: 22 mg/dL (ref 7–25)
CHLORIDE: 103 mmol/L (ref 98–110)
CO2: 26 mmol/L (ref 20–31)
CREATININE: 0.93 mg/dL (ref 0.70–1.25)
Calcium: 9.5 mg/dL (ref 8.6–10.3)
GLUCOSE: 88 mg/dL (ref 65–99)
Potassium: 4.7 mmol/L (ref 3.5–5.3)
Sodium: 136 mmol/L (ref 135–146)

## 2015-12-26 NOTE — Progress Notes (Unsigned)
Talked to Dr. Caryl Comes About Echo.

## 2016-01-17 ENCOUNTER — Other Ambulatory Visit: Payer: Self-pay

## 2016-01-17 ENCOUNTER — Telehealth: Payer: Self-pay | Admitting: Internal Medicine

## 2016-01-17 MED ORDER — METFORMIN HCL 500 MG PO TABS: 500.0000 mg | ORAL_TABLET | Freq: Two times a day (BID) | ORAL | Status: DC

## 2016-01-17 NOTE — Telephone Encounter (Signed)
Patient called requesting Medication refill for Metformin 500 MG tablet. Please send to [place].

## 2016-01-18 NOTE — Telephone Encounter (Signed)
Patient informed. 

## 2016-01-25 ENCOUNTER — Encounter: Payer: Self-pay | Admitting: Internal Medicine

## 2016-01-25 ENCOUNTER — Ambulatory Visit (INDEPENDENT_AMBULATORY_CARE_PROVIDER_SITE_OTHER): Payer: Self-pay | Admitting: Internal Medicine

## 2016-01-25 VITALS — BP 130/84 | HR 68 | Resp 14 | Ht 70.0 in | Wt 230.0 lb

## 2016-01-25 DIAGNOSIS — Z79899 Other long term (current) drug therapy: Secondary | ICD-10-CM

## 2016-01-25 DIAGNOSIS — B349 Viral infection, unspecified: Secondary | ICD-10-CM

## 2016-01-25 DIAGNOSIS — E119 Type 2 diabetes mellitus without complications: Secondary | ICD-10-CM

## 2016-01-25 LAB — GLUCOSE, POCT (MANUAL RESULT ENTRY): POC Glucose: 119 mg/dl — AB (ref 70–99)

## 2016-01-25 NOTE — Progress Notes (Signed)
Subjective:    Patient ID: Kyle Burke, male    DOB: 1946/04/12, 70 y.o.   MRN: [id number]  HPI   Bilateral lower legs with weakness and fatigue when walking: Started 5 days ago.  States he thinks secondary to increase of Spironolactone to a whole tablet., which was done mid April.  He was also to decrease potassium to 10 mEq daily, which he did.  Denies any muscular cramping or aching or other pain--simply feels tired in the muscles.   States in past he was on another medication that caused the same problem. He cannot recall which medication caused this in the past.  He then states he has also had a cough for 3 days, about a day after the fatigue in his lower legs started.  Started Coricidin HBP (Chlorpheniramine, Tylenol, and DM) for the cough. Has a lot of phlegm, but no dyspnea.  Again, just tired when walks. No itchy watery eyes, nose or throat.  Throat with mild irritation. No swelling of legs or ankles. No chest pain. No fever. No one ill around him at home or work. Blood glucoses have been great--generally under 100 fasting.  120 2-3 hours postprandial.  2.  Has a tender bump under left nipple--for a week.  Feels sore like a pimple, but cannot see anything there right now   Current outpatient prescriptions:  .  carvedilol (COREG) 12.5 MG tablet, Take 1 tab by mouth in the morning and 2 tabs by mouth in the evening., Disp: 90 tablet, Rfl: 11 .  DM-APAP-CPM (CORICIDIN HBP FLU) 15-500-2 MG TABS, Take 2 capsules by mouth every 6 (six) hours., Disp: , Rfl:  .  furosemide (LASIX) 40 MG tablet, Take 0.5 tablets (20 mg total) by mouth daily. (Patient taking differently: Take 20 mg by mouth daily. ), Disp: 45 tablet, Rfl: 3 .  lisinopril (PRINIVIL,ZESTRIL) 40 MG tablet, Take 1 tablet (40 mg total) by mouth daily., Disp: 30 tablet, Rfl: 11 .  metFORMIN (GLUCOPHAGE) 500 MG tablet, Take 1 tablet (500 mg total) by mouth 2 (two) times daily with a meal., Disp: 60 tablet, Rfl: 11 .   potassium chloride (K-DUR) 10 MEQ tablet, Take 1 tablet (10 mEq total) by mouth daily., Disp: 30 tablet, Rfl: 11 .  rivaroxaban (XARELTO) 20 MG TABS tablet, Take 1 tablet (20 mg total) by mouth daily with supper., Disp: 30 tablet, Rfl: 11 .  spironolactone (ALDACTONE) 25 MG tablet, Take 1 tablet (25 mg total) by mouth daily., Disp: 30 tablet, Rfl: 11   No Known Allergies        Review of Systems     Objective:   Physical Exam Appears mildly ill--like with head congestion--eyes tired HEENT:  PERRL, EOMI, mild conjunctival injection, TMs pearly gray, nasal mucosa erythematous and mildly swollen, clear discharge.  Throat without injection or exudate Neck:  Supple, no adenopathy Chest:  CTA CV:  Irregularly irregular, without murmur or rub, radial and DP pulses normal and equal Abd:  S, NT, No HSM or mass, + BS LE:  No edema or erythema.  NT of musculature to palpation.  Left nipple: no focal mass,nipple discharge, skin dimpling, erythema or other abnormality.  Pt. States just a bit tender on palpation       Assessment & Plan:  1.  Viral Syndrome:  However, with all of his meds affecting electrolytes and possible renal function, check BMP.  Rest, fluids.  Call if worsens.  2.  Left nipple complaints:  No findings today,  but to call if continues or worsens.  May need U/S if so

## 2016-01-25 NOTE — Patient Instructions (Signed)
Habla clinica si mas mal. 

## 2016-01-26 LAB — BASIC METABOLIC PANEL
BUN/Creatinine Ratio: 23 (ref 10–24)
BUN: 26 mg/dL (ref 8–27)
CO2: 20 mmol/L (ref 18–29)
Calcium: 8.9 mg/dL (ref 8.6–10.2)
Chloride: 100 mmol/L (ref 96–106)
Creatinine, Ser: 1.14 mg/dL (ref 0.76–1.27)
GFR calc Af Amer: 76 mL/min/{1.73_m2} (ref >59)
GFR, EST NON AFRICAN AMERICAN: 66 mL/min/{1.73_m2} (ref >59)
GLUCOSE: 99 mg/dL (ref 65–99)
Potassium: 4.9 mmol/L (ref 3.5–5.2)
SODIUM: 136 mmol/L (ref 134–144)

## 2016-01-28 ENCOUNTER — Telehealth: Payer: Self-pay | Admitting: *Deleted

## 2016-01-28 NOTE — Telephone Encounter (Signed)
Pt walked into the office with complaints of bilateral leg weakness.  Pt has his daughter with him for translation. Pt states its due to taking spironolactone.  Pt has no LEE, no complaints of pain in his lower extremities, and no cramping.  Pt had absolutely no cardiac complaints at all. Pt just saw his PCP Dr Amil Amen on 01/25/16 for same complaints and was diagnosed with a virus.  Pt had a BMET drawn at his PCP office to assess his current electrolytes.  Pts bmet was fine per Dr Amil Amen.  Per Dr Mulberry's assessment and plan, she advised the pt to call back to her office, if symptoms do not improve or start to worsen.  Advised the pt that being this is not cardiac in nature, and his BMET was WNL, he should call his PCP, just as instructed, or go to his PCP office as a walk-in, at [facility].  Both daughter and pt verbalized understanding and agrees with this plan.

## 2016-01-31 ENCOUNTER — Telehealth: Payer: Self-pay | Admitting: Internal Medicine

## 2016-01-31 NOTE — Telephone Encounter (Signed)
Noted  

## 2016-01-31 NOTE — Telephone Encounter (Signed)
Patient came in today stating his cough has been resolved, but no improvement on weakness and fatigue. Patient would like advise on what he needs to take.

## 2016-01-31 NOTE — Telephone Encounter (Signed)
Patient states there is little  improvement in his sense of fatigue. No shortness of breath. No chest pain. No palpitations above baseline. Both legs feel weak. No numbness or tingling. No lightheaded. No black sticky stool.  Per Dr. Amil Amen patient needs to start taking half tablet of the Spironolactone 25 mg tablet.  (patient informed) Patient to call the office on Monday, June 19th with a progress report

## 2016-02-08 ENCOUNTER — Other Ambulatory Visit: Payer: Self-pay

## 2016-02-08 ENCOUNTER — Ambulatory Visit (INDEPENDENT_AMBULATORY_CARE_PROVIDER_SITE_OTHER): Payer: Self-pay | Admitting: Internal Medicine

## 2016-02-08 ENCOUNTER — Emergency Department (HOSPITAL_COMMUNITY): Payer: Self-pay

## 2016-02-08 ENCOUNTER — Other Ambulatory Visit (HOSPITAL_COMMUNITY): Payer: Self-pay

## 2016-02-08 ENCOUNTER — Encounter: Payer: Self-pay | Admitting: Internal Medicine

## 2016-02-08 ENCOUNTER — Inpatient Hospital Stay (HOSPITAL_COMMUNITY)
Admission: EM | Admit: 2016-02-08 | Discharge: 2016-02-11 | DRG: 291 | Disposition: A | Discharge status: Home / Self Care | Payer: Self-pay | Attending: Internal Medicine | Admitting: Internal Medicine

## 2016-02-08 ENCOUNTER — Encounter (HOSPITAL_COMMUNITY): Payer: Self-pay | Admitting: *Deleted

## 2016-02-08 VITALS — BP 120/70 | HR 56 | Temp 97.7°F | Resp 16 | Wt 250.0 lb

## 2016-02-08 DIAGNOSIS — R601 Generalized edema: Secondary | ICD-10-CM

## 2016-02-08 DIAGNOSIS — E1122 Type 2 diabetes mellitus with diabetic chronic kidney disease: Secondary | ICD-10-CM | POA: Diagnosis present

## 2016-02-08 DIAGNOSIS — R195 Other fecal abnormalities: Secondary | ICD-10-CM | POA: Diagnosis present

## 2016-02-08 DIAGNOSIS — I5021 Acute systolic (congestive) heart failure: Secondary | ICD-10-CM

## 2016-02-08 DIAGNOSIS — N183 Chronic kidney disease, stage 3 (moderate): Secondary | ICD-10-CM | POA: Diagnosis present

## 2016-02-08 DIAGNOSIS — Z7984 Long term (current) use of oral hypoglycemic drugs: Secondary | ICD-10-CM

## 2016-02-08 DIAGNOSIS — I5023 Acute on chronic systolic (congestive) heart failure: Secondary | ICD-10-CM | POA: Diagnosis present

## 2016-02-08 DIAGNOSIS — I447 Left bundle-branch block, unspecified: Secondary | ICD-10-CM | POA: Diagnosis present

## 2016-02-08 DIAGNOSIS — I4891 Unspecified atrial fibrillation: Secondary | ICD-10-CM | POA: Diagnosis present

## 2016-02-08 DIAGNOSIS — I420 Dilated cardiomyopathy: Secondary | ICD-10-CM | POA: Diagnosis present

## 2016-02-08 DIAGNOSIS — R188 Other ascites: Secondary | ICD-10-CM | POA: Insufficient documentation

## 2016-02-08 DIAGNOSIS — R140 Abdominal distension (gaseous): Secondary | ICD-10-CM | POA: Insufficient documentation

## 2016-02-08 DIAGNOSIS — I130 Hypertensive heart and chronic kidney disease with heart failure and stage 1 through stage 4 chronic kidney disease, or unspecified chronic kidney disease: Principal | ICD-10-CM | POA: Diagnosis present

## 2016-02-08 DIAGNOSIS — Z7901 Long term (current) use of anticoagulants: Secondary | ICD-10-CM

## 2016-02-08 DIAGNOSIS — I10 Essential (primary) hypertension: Secondary | ICD-10-CM | POA: Diagnosis present

## 2016-02-08 DIAGNOSIS — I428 Other cardiomyopathies: Secondary | ICD-10-CM | POA: Diagnosis present

## 2016-02-08 DIAGNOSIS — Z86711 Personal history of pulmonary embolism: Secondary | ICD-10-CM

## 2016-02-08 DIAGNOSIS — E871 Hypo-osmolality and hyponatremia: Secondary | ICD-10-CM | POA: Diagnosis present

## 2016-02-08 LAB — CBC
HCT: 42.8 % (ref 39.0–52.0)
Hemoglobin: 14 g/dL (ref 13.0–17.0)
MCH: 29.3 pg (ref 26.0–34.0)
MCHC: 32.7 g/dL (ref 30.0–36.0)
MCV: 89.5 fL (ref 78.0–100.0)
PLATELETS: 192 10*3/uL (ref 150–400)
RBC: 4.78 MIL/uL (ref 4.22–5.81)
RDW: 15.1 % (ref 11.5–15.5)
WBC: 7.2 10*3/uL (ref 4.0–10.5)

## 2016-02-08 LAB — HEPATIC FUNCTION PANEL
ALK PHOS: 81 U/L (ref 38–126)
ALT: 32 U/L (ref 17–63)
AST: 28 U/L (ref 15–41)
Albumin: 3.8 g/dL (ref 3.5–5.0)
BILIRUBIN DIRECT: 0.3 mg/dL (ref 0.1–0.5)
BILIRUBIN INDIRECT: 1.4 mg/dL — AB (ref 0.3–0.9)
TOTAL PROTEIN: 6.7 g/dL (ref 6.5–8.1)
Total Bilirubin: 1.7 mg/dL — ABNORMAL HIGH (ref 0.3–1.2)

## 2016-02-08 LAB — BASIC METABOLIC PANEL
ANION GAP: 5 (ref 5–15)
BUN: 22 mg/dL — ABNORMAL HIGH (ref 6–20)
CALCIUM: 9.2 mg/dL (ref 8.9–10.3)
CO2: 26 mmol/L (ref 22–32)
CREATININE: 1.29 mg/dL — AB (ref 0.61–1.24)
Chloride: 102 mmol/L (ref 101–111)
GFR, EST NON AFRICAN AMERICAN: 55 mL/min — AB (ref >60)
Glucose, Bld: 109 mg/dL — ABNORMAL HIGH (ref 65–99)
Potassium: 4.9 mmol/L (ref 3.5–5.1)
Sodium: 133 mmol/L — ABNORMAL LOW (ref 135–145)

## 2016-02-08 LAB — I-STAT TROPONIN, ED: Troponin i, poc: 0.02 ng/mL (ref 0.00–0.08)

## 2016-02-08 LAB — BRAIN NATRIURETIC PEPTIDE: B NATRIURETIC PEPTIDE 5: 2404.7 pg/mL — AB (ref 0.0–100.0)

## 2016-02-08 NOTE — ED Provider Notes (Signed)
CSN: [id number]     Arrival date & time 02/08/16  1804 History   First MD Initiated Contact with Patient 02/08/16 2139     Chief Complaint  Patient presents with  . Shortness of Breath     (Consider location/radiation/quality/duration/timing/severity/associated sxs/prior Treatment) HPI Comments: Patient with history of congestive heart failure, ejection fraction approximately 20%, secondary to nonischemic cardiomyopathy atrial fibrillation and pulmonary embolism on the Xarelto -- presents with 2 weeks of generalized fatigue and weakness, increased abdominal swelling, and shortness of breath. Patient has been seen by his primary care physician and cardiologist. Initially, symptoms were thought to be related to a viral illness. Symptoms have continued to worsen. Patient reports shortness of breath with exertion. Reports 20 pound weight gain over the past 2 weeks. Mild lower extremity edema. No orthopnea or chest pains. States that he has been taking his Lasix. The onset of this condition was acute. The course is constant. Aggravating factors: Exertion. Alleviating factors: none.    The history is provided by the patient and medical records. The history is limited by a language barrier. A language interpreter was used (Family at bedside).    Past Medical History  Diagnosis Date  . Diabetes mellitus 2001  . Hypertension 2006  . Idiopathic cardiomyopathy ([place]) 11/01/2014    Followed by Dr. Acie Fredrickson, Cardiology  EF 25%  Adenosine Cardiolite did not support ischemic etiology  . Atrial fibrillation ([place]) 2016   Past Surgical History  Procedure Laterality Date  . Incise and drain abcess  1986    on leg  . Inguinal hernia repair  01/16/2012    Procedure: LAPAROSCOPIC INGUINAL HERNIA;  Surgeon: Gayland Curry, MD,FACS;  Location: [facility];  Service: General;  Laterality: Left;  Laparoscopic incarcerated Left inguinal hernia repair with mesh, umbilical hernia repair  . Umbilical hernia repair  01/16/2012     Procedure: HERNIA REPAIR UMBILICAL ADULT;  Surgeon: Gayland Curry, MD,FACS;  Location: [facility];  Service: General;  Laterality: N/A;   Family History  Problem Relation Age of Onset  . Stroke Mother   . Heart disease Father   . Diabetes Father   . Kidney disease Father     , history of stones as well  . Diabetes Brother   . Cancer Sister 62    unknown soft tissue cancer of leg   Social History  Substance Use Topics  . Smoking status: Never Smoker   . Smokeless tobacco: Never Used  . Alcohol Use: No     Comment: rare occ.    Review of Systems  Constitutional: Negative for fever.  HENT: Negative for rhinorrhea and sore throat.   Eyes: Negative for redness.  Respiratory: Positive for shortness of breath. Negative for cough.   Cardiovascular: Negative for chest pain.  Gastrointestinal: Positive for abdominal distention. Negative for nausea, vomiting, abdominal pain and diarrhea.  Genitourinary: Negative for dysuria and frequency.  Musculoskeletal: Negative for myalgias.  Skin: Negative for rash.  Neurological: Negative for headaches.      Allergies  Review of patient's allergies indicates no known allergies.  Home Medications   Prior to Admission medications   Medication Sig Start Date End Date Taking? Authorizing Provider  carvedilol (COREG) 12.5 MG tablet Take 1 tab by mouth in the morning and 2 tabs by mouth in the evening. 08/06/15   Mack Hook, MD  DM-APAP-CPM (CORICIDIN HBP FLU) 15-500-2 MG TABS Take 2 capsules by mouth every 6 (six) hours. Reported on 02/08/2016 01/23/16   Historical Provider,  MD  furosemide (LASIX) 40 MG tablet Take 0.5 tablets (20 mg total) by mouth daily. Patient taking differently: Take 20 mg by mouth daily.  12/25/15   Thayer Headings, MD  lisinopril (PRINIVIL,ZESTRIL) 40 MG tablet Take 1 tablet (40 mg total) by mouth daily. 10/04/15   Mack Hook, MD  metFORMIN (GLUCOPHAGE) 500 MG tablet Take 1 tablet (500 mg total) by mouth 2 (two)  times daily with a meal. 01/17/16   Mack Hook, MD  potassium chloride (K-DUR) 10 MEQ tablet Take 1 tablet (10 mEq total) by mouth daily. 12/05/15   Thayer Headings, MD  rivaroxaban (XARELTO) 20 MG TABS tablet Take 1 tablet (20 mg total) by mouth daily with supper. 12/05/15   Thayer Headings, MD  spironolactone (ALDACTONE) 25 MG tablet Take 1 tablet (25 mg total) by mouth daily. 12/05/15   Thayer Headings, MD   BP 124/92 mmHg  Pulse 65  Temp(Src) 97.7 F (36.5 C) (Oral)  Resp 18  Ht 6\' 2"  (1.88 m)  Wt 114.562 kg  BMI 32.41 kg/m2  SpO2 97%   Physical Exam  Constitutional: He appears well-developed and well-nourished.  HENT:  Head: Normocephalic and atraumatic.  Mouth/Throat: Mucous membranes are normal. Mucous membranes are not dry.  Eyes: Conjunctivae are normal. Right eye exhibits no discharge. Left eye exhibits no discharge.  Neck: Trachea normal and normal range of motion. Neck supple. Normal carotid pulses and no JVD present. No muscular tenderness present. Carotid bruit is not present. No tracheal deviation present.  Cardiovascular: Normal rate, S1 normal, S2 normal, normal heart sounds and intact distal pulses.  An irregular rhythm present. Exam reveals no distant heart sounds and no decreased pulses.   No murmur heard. Pulmonary/Chest: Effort normal and breath sounds normal. No respiratory distress. He has no wheezes. He exhibits no tenderness.  Abdominal: Soft. Normal aorta and bowel sounds are normal. He exhibits distension. There is no tenderness. There is no rebound and no guarding.  Moderate ascites noted.  Genitourinary:  Mild scrotal edema.  Musculoskeletal: He exhibits edema. He exhibits no tenderness.  Trace pedal edema  Neurological: He is alert.  Skin: Skin is warm and dry. He is not diaphoretic. No cyanosis. No pallor.  Psychiatric: He has a normal mood and affect.  Nursing note and vitals reviewed.   ED Course  Procedures (including critical care  time) Labs Review Labs Reviewed  BRAIN NATRIURETIC PEPTIDE - Abnormal; Notable for the following:    B Natriuretic Peptide 2404.7 (*)    All other components within normal limits  BASIC METABOLIC PANEL - Abnormal; Notable for the following:    Sodium 133 (*)    Glucose, Bld 109 (*)    BUN 22 (*)    Creatinine, Ser 1.29 (*)    GFR calc non Af Amer 55 (*)    All other components within normal limits  HEPATIC FUNCTION PANEL - Abnormal; Notable for the following:    Total Bilirubin 1.7 (*)    Indirect Bilirubin 1.4 (*)    All other components within normal limits  CBC  I-STAT TROPOININ, ED    Imaging Review Dg Chest 2 View  02/08/2016  CLINICAL DATA:  Weakness, shortness of breath for 3 weeks EXAM: CHEST  2 VIEW COMPARISON:  01/13/2012 FINDINGS: Cardiomediastinal silhouette is stable. No pulmonary edema. There is streaky left base retrocardiac atelectasis or early infiltrate. Degenerative changes thoracic spine. IMPRESSION: No pulmonary edema. Streaky left base retrocardiac atelectasis or early infiltrate. Degenerative changes thoracic  spine. Electronically Signed   By: Lahoma Crocker M.D.   On: 02/08/2016 19:55   I have personally reviewed and evaluated these images and lab results as part of my medical decision-making.   EKG Interpretation   Date/Time:  Friday February 08 2016 18:26:23 EDT Ventricular Rate:  103 PR Interval:    QRS Duration: 166 QT Interval:  424 QTC Calculation: 555 R Axis:   -80 Text Interpretation:  Atrial fibrillation with rapid ventricular response  with premature ventricular or aberrantly conducted complexes Left axis  deviation Left bundle branch block Abnormal ECG LBBB is new compared to  2013 Confirmed by GOLDSTON MD, [place] [phone]) on 02/08/2016 9:25:07 PM       9:59 PM Patient seen and examined. Work-up initiated. Medications ordered.   Vital signs reviewed and are as follows: BP 124/92 mmHg  Pulse 65  Temp(Src) 97.7 F (36.5 C) (Oral)  Resp 18  Ht  6\' 2"  (1.88 m)  Wt 114.562 kg  BMI 32.41 kg/m2  SpO2 97%  Patient discussed and seen by Dr. Regenia Skeeter. Patient ambulated with shortness of breath and decreased oxygen saturation into the low 90s. Also tachycardic with activity.  Given patient with right-sided heart failure symptoms, worsening, weight gain -- feel admission for diuresis is indicated.  Spoke with Dr. Loleta Books who will see.   MDM   Final diagnoses:  Acute systolic congestive heart failure ([place])   Admit for fluid retention In setting of congestive heart failure causing shortness of breath with exertion and mild hypoxia.   Carlisle Cater, PA-C 02/09/16 0127  Sherwood Gambler, MD 02/11/16 [phone]

## 2016-02-08 NOTE — ED Notes (Signed)
Pt ambulated in hall. Pts Sp02 dropped to 91% on room air. HR rised to the 150s. Upon arrival to the room. Pt was connected back to monitor in room for varification of HR. Pts HR and Sp02 matched. Pt stated that he got tired from the walk from and to the room. After sitting back down for a bit HR dropped down to 120s and eventually down to 105. Pts HR currently from 95-104HR

## 2016-02-08 NOTE — ED Notes (Addendum)
Pt c/o weakness & SOB onset x 3 wks, pts son at bedside & reports his PCP sent him here today for fluid retention in his abdomen, pt denies CP, n/v/d, pt takes Spironolactone, pt lips appear cyanotic, O2 on RA WNL, pt A&O x4

## 2016-02-08 NOTE — Progress Notes (Signed)
   Subjective:    Patient ID: Kyle Burke, male    DOB: 1945-11-27, 70 y.o.   MRN: [id number]  HPI   Pt. Still very tired. Also very concerning, has gained 20 lbs since last visit on the 9th.  He states the weight is all in his abdomen.  He states he feels the swelling in his abdomen started before his spironolactone was cut in half and then stopped. He stopped the Spironolactone about 10 days ago. No change in color of his sclera. His wife really feels like the swelling really became a problem in the past week. He did stop into Dr. Elmarie Shiley office about 1 1/2 weeks ago due to fatigue--checked out by nurse and felt to have viral illness as well.  The abdominal swelling really started after that visit. No increased swelling in legs per patient, though wife disagrees. He has a no added sodium diet. No chest pain or dypsnea. No PND or orthopnea symptoms  No melena or hematochezia.  Had hemoccult cards x 3 in March with CPE that were negative for blood. No low cost source for colonoscopy. Having BMs three times daily, which is more than usual, but formed and the color of what he eats. Urinating fine with good output.  When he walks, just extremely fatigued.  He states he is the same as when first came in with fatigue, but wife states it is worse than when last here on 01/25/2016   Current outpatient prescriptions:  .  carvedilol (COREG) 12.5 MG tablet, Take 1 tab by mouth in the morning and 2 tabs by mouth in the evening., Disp: 90 tablet, Rfl: 11 .  furosemide (LASIX) 40 MG tablet, Take 0.5 tablets (20 mg total) by mouth daily. (Patient taking differently: Take 20 mg by mouth daily. ), Disp: 45 tablet, Rfl: 3 .  lisinopril (PRINIVIL,ZESTRIL) 40 MG tablet, Take 1 tablet (40 mg total) by mouth daily., Disp: 30 tablet, Rfl: 11 .  metFORMIN (GLUCOPHAGE) 500 MG tablet, Take 1 tablet (500 mg total) by mouth 2 (two) times daily with a meal., Disp: 60 tablet, Rfl: 11 .  potassium chloride (K-DUR) 10  MEQ tablet, Take 1 tablet (10 mEq total) by mouth daily., Disp: 30 tablet, Rfl: 11 .  rivaroxaban (XARELTO) 20 MG TABS tablet, Take 1 tablet (20 mg total) by mouth daily with supper., Disp: 30 tablet, Rfl: 11 .  spironolactone (ALDACTONE) 25 MG tablet, Take 1 tablet (25 mg total) by mouth daily., Disp: 30 tablet, Rfl: 11 .  DM-APAP-CPM (CORICIDIN HBP FLU) 15-500-2 MG TABS, Take 2 capsules by mouth every 6 (six) hours. Reported on 02/08/2016, Disp: , Rfl:    No Known Allergies   Review of Systems     Objective:   Physical Exam  Wan appearance--fatigued HEENT:  PERRL, EOMI, Palpebral conjunctivae pale, TMs pearly gray, throat without injection Neck:  Supple, No adenopathy Chest:  Bibasilar crackles CV:  Irreg irreg, No murmur.  Mild JVD Abd:  Distended, No HSM, No mass, +BS, NT. + shifting dullness to percussion Rectal:  Heme positive bright brown/orange stool, no mass. Legs:  Pitting edema above knees        Assessment & Plan:  Appears to have Anasarca and concerned likely anemic with exam and  newly heme positive stools. Pt. With chronic afib and low EF which complicates this as well. Feel he needs to be hospitalized for evaluation and treatment.

## 2016-02-09 ENCOUNTER — Inpatient Hospital Stay (HOSPITAL_COMMUNITY): Payer: Self-pay

## 2016-02-09 ENCOUNTER — Encounter (HOSPITAL_COMMUNITY): Payer: Self-pay | Admitting: Family Medicine

## 2016-02-09 DIAGNOSIS — E871 Hypo-osmolality and hyponatremia: Secondary | ICD-10-CM | POA: Diagnosis present

## 2016-02-09 DIAGNOSIS — E1122 Type 2 diabetes mellitus with diabetic chronic kidney disease: Secondary | ICD-10-CM

## 2016-02-09 DIAGNOSIS — I5023 Acute on chronic systolic (congestive) heart failure: Secondary | ICD-10-CM | POA: Diagnosis present

## 2016-02-09 DIAGNOSIS — R140 Abdominal distension (gaseous): Secondary | ICD-10-CM | POA: Insufficient documentation

## 2016-02-09 DIAGNOSIS — N183 Chronic kidney disease, stage 3 (moderate): Secondary | ICD-10-CM

## 2016-02-09 LAB — CBC
HCT: 37.9 % — ABNORMAL LOW (ref 39.0–52.0)
HEMOGLOBIN: 12.3 g/dL — AB (ref 13.0–17.0)
MCH: 29.1 pg (ref 26.0–34.0)
MCHC: 32.5 g/dL (ref 30.0–36.0)
MCV: 89.6 fL (ref 78.0–100.0)
Platelets: 178 10*3/uL (ref 150–400)
RBC: 4.23 MIL/uL (ref 4.22–5.81)
RDW: 15.1 % (ref 11.5–15.5)
WBC: 5.3 10*3/uL (ref 4.0–10.5)

## 2016-02-09 LAB — BASIC METABOLIC PANEL
Anion gap: 8 (ref 5–15)
BUN: 21 mg/dL — ABNORMAL HIGH (ref 6–20)
CALCIUM: 9.2 mg/dL (ref 8.9–10.3)
CO2: 29 mmol/L (ref 22–32)
CREATININE: 1.14 mg/dL (ref 0.61–1.24)
Chloride: 101 mmol/L (ref 101–111)
GFR calc Af Amer: >60 mL/min (ref >60)
GFR calc non Af Amer: >60 mL/min (ref >60)
GLUCOSE: 100 mg/dL — AB (ref 65–99)
Potassium: 3.9 mmol/L (ref 3.5–5.1)
Sodium: 138 mmol/L (ref 135–145)

## 2016-02-09 LAB — TSH: TSH: 3.751 u[IU]/mL (ref 0.350–4.500)

## 2016-02-09 LAB — GLUCOSE, CAPILLARY
GLUCOSE-CAPILLARY: 144 mg/dL — AB (ref 65–99)
Glucose-Capillary: 99 mg/dL (ref 65–99)
Glucose-Capillary: 84 mg/dL (ref 65–99)
Glucose-Capillary: 110 mg/dL — ABNORMAL HIGH (ref 65–99)
Glucose-Capillary: 129 mg/dL — ABNORMAL HIGH (ref 65–99)

## 2016-02-09 LAB — FERRITIN: FERRITIN: 33 ng/mL (ref 24–336)

## 2016-02-09 LAB — BRAIN NATRIURETIC PEPTIDE: B Natriuretic Peptide: 2112.2 pg/mL — ABNORMAL HIGH (ref 0.0–100.0)

## 2016-02-09 MED ORDER — FUROSEMIDE 10 MG/ML IJ SOLN
40.0000 mg | Freq: Two times a day (BID) | INTRAMUSCULAR | Status: DC
Start: 2016-02-09 — End: 2016-02-11
  Administered 2016-02-09 – 2016-02-11 (×5): 40 mg via INTRAVENOUS
  Filled 2016-02-09 (×5): qty 4

## 2016-02-09 MED ORDER — FUROSEMIDE 10 MG/ML IJ SOLN
40.0000 mg | Freq: Once | INTRAMUSCULAR | Status: AC
  Administered 2016-02-09: 40 mg via INTRAVENOUS
  Filled 2016-02-09: qty 4

## 2016-02-09 MED ORDER — LISINOPRIL 40 MG PO TABS
40.0000 mg | ORAL_TABLET | Freq: Every day | ORAL | Status: DC
  Administered 2016-02-09 – 2016-02-11 (×3): 40 mg via ORAL
  Filled 2016-02-09 (×3): qty 1

## 2016-02-09 MED ORDER — SENNOSIDES-DOCUSATE SODIUM 8.6-50 MG PO TABS: 1.0000 | ORAL_TABLET | Freq: Every evening | ORAL | Status: DC | PRN

## 2016-02-09 MED ORDER — INSULIN ASPART 100 UNIT/ML ~~LOC~~ SOLN: 0.0000 [IU] | Freq: Every day | SUBCUTANEOUS | Status: DC

## 2016-02-09 MED ORDER — POTASSIUM CHLORIDE CRYS ER 20 MEQ PO TBCR
20.0000 meq | EXTENDED_RELEASE_TABLET | Freq: Two times a day (BID) | ORAL | Status: DC
  Administered 2016-02-09 – 2016-02-11 (×6): 20 meq via ORAL
  Filled 2016-02-09 (×6): qty 1

## 2016-02-09 MED ORDER — CARVEDILOL 12.5 MG PO TABS
12.5000 mg | ORAL_TABLET | Freq: Two times a day (BID) | ORAL | Status: DC
  Administered 2016-02-09 – 2016-02-10 (×3): 12.5 mg via ORAL
  Filled 2016-02-09 (×6): qty 1

## 2016-02-09 MED ORDER — RIVAROXABAN 20 MG PO TABS
20.0000 mg | ORAL_TABLET | Freq: Every day | ORAL | Status: DC
  Administered 2016-02-09 – 2016-02-10 (×2): 20 mg via ORAL
  Filled 2016-02-09 (×2): qty 1

## 2016-02-09 MED ORDER — INSULIN ASPART 100 UNIT/ML ~~LOC~~ SOLN
0.0000 [IU] | Freq: Three times a day (TID) | SUBCUTANEOUS | Status: DC
  Administered 2016-02-09 – 2016-02-10 (×2): 1 [IU] via SUBCUTANEOUS

## 2016-02-09 NOTE — H&P (Signed)
History and Physical  Patient Name: Kyle Burke     [id number]    DOB: 1946-05-04    DOA: 02/08/2016 PCP: Mack Hook, MD   Patient coming from: Home --[facility] ---> ER  Chief Complaint: Fatigue with exertion  HPI: Imad Kris is a 70 y.o. male with a past medical history significant for Afib on Xarelto, CHF EF 20% NICM who presents with exertional fatigue for one month and weight gain.  History collected from son who requests to translate and chart review.  The patient was diagnosed with atrial fibrillation by his PCP 18 months ago, started on anticoagulation and BB without subsequent event.  In routine workup at that time, was found to have EF 25%, dilated CM, normal Cardiolite, and has been seeing Dr. Acie Fredrickson in follow up, has never had CHF flare.  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  The URI resolved, but his fatigue and exertional intolerance persisted and progressed.  Now in the last week, he cannot walk 10 feet without being tired.  He has noticed abdominal girth increase and weight gain of 20lbs (baseline weight 230 lbs).  He has not had orthopnea or PND, he doesn't understand questions about leg swelling, and he denies chest pain, palpitations, fever, sputum, or wheezing.  Went to his PCP today who was concerned that he was pale and had guiac positive stool, plus symptoms of CHF and sent him to the ER.  ED course: -Afebrile, hemodynamically stable -Na 133, K 4.9, Cr 1.3 (baseline 1.2), WBC 7.2K, Hgb 14 -BNP 2404 pg/mL -CXR showed no frank edema but vascular congestion, and ECG was unchanged, and troponin was negative -He was given furosemide 40 mg IV and TRH were called for  admission     Review of Systems:  Patient endorses exertional fatigue, abdominal girth, weight gain.  He denies melena, hematochezia, hematemesis.  Denies orthopnea, PND.  Denies wheezing, fever, sputum.  All other systems negative except as just noted or noted in the history of present illness.    Past Medical History  Diagnosis Date  . Diabetes mellitus 2001  . Hypertension 2006  . Idiopathic cardiomyopathy ([place]) 11/01/2014    Followed by Dr. Acie Fredrickson, Cardiology  EF 25%  Adenosine Cardiolite did not support ischemic etiology  . Atrial fibrillation ([place]) 2016    Past Surgical History  Procedure Laterality Date  . Incise and drain abcess  1986    on leg  . Inguinal hernia repair  01/16/2012    Procedure: LAPAROSCOPIC INGUINAL HERNIA;  Surgeon: Gayland Curry, MD,FACS;  Location: [facility];  Service: General;  Laterality: Left;  Laparoscopic incarcerated Left inguinal hernia repair with mesh, umbilical hernia repair  . Umbilical hernia repair  01/16/2012    Procedure: HERNIA REPAIR UMBILICAL ADULT;  Surgeon: Gayland Curry, MD,FACS;  Location: [facility];  Service: General;  Laterality: N/A;    Social History: Patient lives with his wife.  The patient walks unassisted.  He is not a smoker.  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  From [place] originally. 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    No Known Allergies  Family history: family history includes Cancer (  age of onset: 28) in his sister; Diabetes in his brother and father; Heart disease in his father; Kidney disease in his father; Stroke in his mother.  Prior to Admission medications   Medication Sig Start Date End Date Taking? Authorizing Provider  acetaminophen (TYLENOL) 650 MG CR tablet Take 650 mg by mouth every 8 (eight) hours as needed for pain.   Yes Historical Provider, MD  carvedilol (COREG) 12.5 MG tablet Take 1 tab by mouth in the morning and 2 tabs by mouth in the evening. Patient  taking differently: Take 12.5 mg by mouth 2 (two) times daily with a meal.  08/06/15  Yes Mack Hook, MD  furosemide (LASIX) 40 MG tablet Take 0.5 tablets (20 mg total) by mouth daily. Patient taking differently: Take 20 mg by mouth daily.  12/25/15  Yes Thayer Headings, MD  lisinopril (PRINIVIL,ZESTRIL) 40 MG tablet Take 1 tablet (40 mg total) by mouth daily. 10/04/15  Yes Mack Hook, MD  metFORMIN (GLUCOPHAGE) 500 MG tablet Take 1 tablet (500 mg total) by mouth 2 (two) times daily with a meal. 01/17/16  Yes Mack Hook, MD  potassium chloride (K-DUR) 10 MEQ tablet Take 1 tablet (10 mEq total) by mouth daily. 12/05/15  Yes Thayer Headings, MD  rivaroxaban (XARELTO) 20 MG TABS tablet Take 1 tablet (20 mg total) by mouth daily with supper. 12/05/15  Yes Thayer Headings, MD       Physical Exam: BP 134/97 mmHg  Pulse 79  Temp(Src) 97.7 F (36.5 C) (Oral)  Resp 18  Ht 6\' 2"  (1.88 m)  Wt 114.562 kg (252 lb 9 oz)  BMI 32.41 kg/m2  SpO2 97% General appearance: Well-developed, adult male, alert and in no acute distress, lying flat in bed, tired appearing.   Eyes: Anicteric, lids and lashes normal.     ENT: No nasal deformity, discharge, or epistaxis.  OP moist without lesions.   Lymph: No cervical or supraclavicular lymphadenopathy. Skin: Warm and dry.  No jaundice.  No suspicious rashes or lesions. Cardiac: Irregularly irregular, nl S1-S2, no murmur appreciated, but intermittent gallop.  Capillary refill is brisk.  JVP elevated.  1+ pitting to knee.  Radial pulses 2+ and symmetric. Respiratory: Normal respiratory rate and rhythm.  CTAB without rales or wheezes. Abdomen: Abdomen soft without rigidity.  No TTP, slightly distended.  MSK: No deformities or effusions. Neuro: Sensorium intact and responding to questions, attention normal.  Speech is fluent.  Moves all extremities equally and with normal coordination.    Psych: Behavior appropriate.  Affect tired.  No evidence of  aural or visual hallucinations or delusions.       Labs on Admission:  I have personally reviewed following labs and imaging studies: CBC:  Recent Labs Lab 02/08/16 1827  WBC 7.2  HGB 14.0  HCT 42.8  MCV 89.5  PLT [id number]   Basic Metabolic Panel:  Recent Labs Lab 02/08/16 1827  NA 133*  K 4.9  CL 102  CO2 26  GLUCOSE 109*  BUN 22*  CREATININE 1.29*  CALCIUM 9.2   GFR: Estimated Creatinine Clearance: 73.8 mL/min (by C-G formula based on Cr of 1.29).  Liver Function Tests:  Recent Labs Lab 02/08/16 1827  AST 28  ALT 32  ALKPHOS 81  BILITOT 1.7*  PROT 6.7  ALBUMIN 3.8        Radiological Exams on Admission: Personally reviewed: Dg Chest 2 View  02/08/2016  CLINICAL DATA:  Weakness, shortness of breath for 3 weeks EXAM:  CHEST  2 VIEW COMPARISON:  01/13/2012 FINDINGS: Cardiomediastinal silhouette is stable. No pulmonary edema. There is streaky left base retrocardiac atelectasis or early infiltrate. Degenerative changes thoracic spine. IMPRESSION: No pulmonary edema. Streaky left base retrocardiac atelectasis or early infiltrate. Degenerative changes thoracic spine. Electronically Signed   By: Lahoma Crocker M.D.   On: 02/08/2016 19:55    EKG: Independently reviewed. Rate 74, QTc 499 but LBBB, old and present in October 19 2014 ECG.    Echocardiogram 12/2015: EF 20% No significant valvular disease. Diffusely hypokinetic dilated cardiomyopathy.    Cardiolite 10/2014: Low risk   Assessment/Plan 1. Acute on chronic CHF:  Last EF last month and was 20%, diffuse hypokinesis.  Baseline weight 230 lbs, 252 lbs at admission.  Has been seen by EP who feel ICD not indicated. -Furosemide 40 mg IV BID -Goal net negative 1.5-2L daily -Tele -Strict I/Os, daily weights, close monitoring of BMP -Potassium supplement -Continue BB and ACEi -Restart spironolactone before discharge -Care mgmt consult for CHF team -Check TSH   2. Hyponatremia:  In setting of CHF -Fluid  restriction to 1.5L and daily BMP  3. Afib:  -Continue Xarelto -Continue BB  4. HTN:  -Continue BB and ACEi  5. NIDDM:  -Hold metformin -Sliding scale corrections  6. Chronic kidney disease:  Baseline Cr 1.3, stable.  7. Heme-positive stool at PCP:  Hemoglobin normal here.  -Repeat FOBT when has BM -Check ferritin     DVT prophylaxis: Xarelto  Code Status: FULL  Family Communication: Sons and wife at bedside  Disposition Plan: Anticipate diuresis for 3-4 days, then discharge Consults called: None Admission status: INPATIENT, tele   Medical decision making: Patient seen at 12:30 AM on 02/09/2016.  The patient was discussed with [name], PA-C. What exists of the patient's chart was reviewed in depth.  Clinical condition: stable.        Edwin Dada [facility] Pager [phone]

## 2016-02-09 NOTE — Progress Notes (Signed)
PROGRESS NOTE    Kyle Burke  [id number] DOB: 1945-10-20 DOA: 02/08/2016  PCP: Mack Hook, MD   Brief Narrative:  70 y/o male with non-ischemic cardiomyopathy, EF 20%, A-fib on Xarelto who has had fatigue with exertion and distension of his abdomen with a 20 lb weight gain. When asked specifically about dyspnea on exertion, he admits to having some but his main complaint is fatigue. When asked about being able to sleep laying flat, he is able to. No swelling in his legs but abdomen was quite swollen. It is better today.  He had  URI about 1 mo ago and was simultaneously started on Aldactone which is when his fatigue started. He feels this fatigue has worsened.   Subjective: As above- specifically asked about alcohol history when he described abdominal swelling. He admits that he used to drink heavy but mainly on weekends since he was in his 67s. He stopped 2 yrs ago.   Assessment & Plan:   Principal Problem:   Acute on chronic systolic CHF? - has had weight gain and has and elevated BNP but lungs clear, not hypoxic, no orthopnea and no pedeal edema - recent ECHO was in March- EF 20%- repeat ECHO  Active Problems: Hyponatremia - fluid overloaded? - does not appear to be on exam today- follow- recheck today  Weight gain - ? Abdominal swelling with alcohol use history - Check ultrasound of abdomen for cirrhosis and ascites  Fatigue - TSH normal - no new medication changes - had recent URI- deconditioned? A-fib with RVR?  - follow on tele and check pulse ox when ambulating    Atrial fibrillation (HCC) - Eliquis- Coreg    Controlled type 2 diabetes mellitus with stage 3 chronic kidney disease, without long-term current use of insulin ( - on Metformin which is on hold- cont sliding scale insulin    Essential hypertension -Coreg, Lisinopril    Hyponatremia   DVT prophylaxis: Eliquis Code Status: Full code Family Communication: daughter in law/ son Disposition  Plan: home in 1-2 Consultants:   none Procedures:   none Antimicrobials:  Anti-infectives    None       Objective: Filed Vitals:   02/09/16 0130 02/09/16 0215 02/09/16 0630 02/09/16 1037  BP: 141/98 124/94 131/85 120/81  Pulse: 38 36 80   Temp:  97.7 F (36.5 C) 97.7 F (36.5 C)   TempSrc:  Oral Oral   Resp: 16 20 18 16   Height:  6\' 3"  (1.905 m)    Weight:  111.766 kg (246 lb 6.4 oz)    SpO2: 98% 100% 100% 100%    Intake/Output Summary (Last 24 hours) at 02/09/16 1111 Last data filed at 02/09/16 1041  Gross per 24 hour  Intake    840 ml  Output   4355 ml  Net  -3515 ml   Filed Weights   02/08/16 1824 02/09/16 0215  Weight: 114.562 kg (252 lb 9 oz) 111.766 kg (246 lb 6.4 oz)    Examination: General exam: Appears comfortable  HEENT: PERRLA, oral mucosa moist, no sclera icterus or thrush Respiratory system: Clear to auscultation. Respiratory effort normal. Cardiovascular system: S1 & S2 heard, RRR.  No murmurs  Gastrointestinal system: Abdomen soft, non-tender, nondistended. Normal bowel sound. No organomegaly Central nervous system: Alert and oriented. No focal neurological deficits. Extremities: No cyanosis, clubbing or edema Skin: No rashes or ulcers Psychiatry:  Mood & affect appropriate.     Data Reviewed: I have personally reviewed following labs and imaging studies  CBC:  Recent Labs Lab 02/08/16 1827  WBC 7.2  HGB 14.0  HCT 42.8  MCV 89.5  PLT [id number]   Basic Metabolic Panel:  Recent Labs Lab 02/08/16 1827  NA 133*  K 4.9  CL 102  CO2 26  GLUCOSE 109*  BUN 22*  CREATININE 1.29*  CALCIUM 9.2   GFR: Estimated Creatinine Clearance: 74 mL/min (by C-G formula based on Cr of 1.29). Liver Function Tests:  Recent Labs Lab 02/08/16 1827  AST 28  ALT 32  ALKPHOS 81  BILITOT 1.7*  PROT 6.7  ALBUMIN 3.8   No results for input(s): LIPASE, AMYLASE in the last 168 hours. No results for input(s): AMMONIA in the last 168  hours. Coagulation Profile: No results for input(s): INR, PROTIME in the last 168 hours. Cardiac Enzymes: No results for input(s): CKTOTAL, CKMB, CKMBINDEX, TROPONINI in the last 168 hours. BNP (last 3 results) No results for input(s): PROBNP in the last 8760 hours. HbA1C: No results for input(s): HGBA1C in the last 72 hours. CBG:  Recent Labs Lab 02/09/16 0209 02/09/16 0557  GLUCAP 99 84   Lipid Profile: No results for input(s): CHOL, HDL, LDLCALC, TRIG, CHOLHDL, LDLDIRECT in the last 72 hours. Thyroid Function Tests:  Recent Labs  02/09/16 0225  TSH 3.751   Anemia Panel:  Recent Labs  02/09/16 0225  FERRITIN 33   Urine analysis: No results found for: COLORURINE, APPEARANCEUR, LABSPEC, PHURINE, GLUCOSEU, HGBUR, BILIRUBINUR, KETONESUR, PROTEINUR, UROBILINOGEN, NITRITE, LEUKOCYTESUR Sepsis Labs: @LABRCNTIP (procalcitonin:4,lacticidven:4) )No results found for this or any previous visit (from the past 240 hour(s)).       Radiology Studies: Dg Chest 2 View  02/08/2016  CLINICAL DATA:  Weakness, shortness of breath for 3 weeks EXAM: CHEST  2 VIEW COMPARISON:  01/13/2012 FINDINGS: Cardiomediastinal silhouette is stable. No pulmonary edema. There is streaky left base retrocardiac atelectasis or early infiltrate. Degenerative changes thoracic spine. IMPRESSION: No pulmonary edema. Streaky left base retrocardiac atelectasis or early infiltrate. Degenerative changes thoracic spine. Electronically Signed   By: Lahoma Crocker M.D.   On: 02/08/2016 19:55      Scheduled Meds: . carvedilol  12.5 mg Oral BID WC  . furosemide  40 mg Intravenous BID  . insulin aspart  0-5 Units Subcutaneous QHS  . insulin aspart  0-9 Units Subcutaneous TID WC  . lisinopril  40 mg Oral Daily  . potassium chloride  20 mEq Oral BID  . rivaroxaban  20 mg Oral Q supper   Continuous Infusions:    LOS: 0 days    Time spent in minutes: 35    [place], MD [facility] Pager: [email or web address] Password [email or web address] 02/09/2016, 11:11 AM

## 2016-02-10 ENCOUNTER — Inpatient Hospital Stay (HOSPITAL_COMMUNITY): Payer: Self-pay

## 2016-02-10 ENCOUNTER — Encounter (HOSPITAL_COMMUNITY): Payer: Self-pay | Admitting: Radiology

## 2016-02-10 ENCOUNTER — Other Ambulatory Visit (HOSPITAL_COMMUNITY): Payer: No Typology Code available for payment source

## 2016-02-10 DIAGNOSIS — I5023 Acute on chronic systolic (congestive) heart failure: Secondary | ICD-10-CM

## 2016-02-10 DIAGNOSIS — R188 Other ascites: Secondary | ICD-10-CM | POA: Insufficient documentation

## 2016-02-10 DIAGNOSIS — R140 Abdominal distension (gaseous): Secondary | ICD-10-CM

## 2016-02-10 LAB — BODY FLUID CELL COUNT WITH DIFFERENTIAL
Eos, Fluid: 0 %
LYMPHS FL: 18 %
MONOCYTE-MACROPHAGE-SEROUS FLUID: 81 % (ref 50–90)
NEUTROPHIL FLUID: 1 % (ref 0–25)
Total Nucleated Cell Count, Fluid: 318 cu mm (ref 0–1000)

## 2016-02-10 LAB — GRAM STAIN

## 2016-02-10 LAB — LACTATE DEHYDROGENASE, PLEURAL OR PERITONEAL FLUID: LD, Fluid: 71 U/L — ABNORMAL HIGH (ref 3–23)

## 2016-02-10 LAB — GLUCOSE, CAPILLARY
GLUCOSE-CAPILLARY: 93 mg/dL (ref 65–99)
GLUCOSE-CAPILLARY: 105 mg/dL — AB (ref 65–99)
GLUCOSE-CAPILLARY: 164 mg/dL — AB (ref 65–99)
Glucose-Capillary: 144 mg/dL — ABNORMAL HIGH (ref 65–99)

## 2016-02-10 LAB — ALBUMIN, FLUID (OTHER): Albumin, Fluid: 2.2 g/dL

## 2016-02-10 LAB — BASIC METABOLIC PANEL
ANION GAP: 6 (ref 5–15)
BUN: 19 mg/dL (ref 6–20)
CALCIUM: 8.8 mg/dL — AB (ref 8.9–10.3)
CO2: 30 mmol/L (ref 22–32)
CREATININE: 1.17 mg/dL (ref 0.61–1.24)
Chloride: 99 mmol/L — ABNORMAL LOW (ref 101–111)
Glucose, Bld: 92 mg/dL (ref 65–99)
Potassium: 3.8 mmol/L (ref 3.5–5.1)
SODIUM: 135 mmol/L (ref 135–145)

## 2016-02-10 LAB — PROTEIN, BODY FLUID: TOTAL PROTEIN, FLUID: 3.4 g/dL

## 2016-02-10 LAB — BRAIN NATRIURETIC PEPTIDE: B Natriuretic Peptide: 1573.8 pg/mL — ABNORMAL HIGH (ref 0.0–100.0)

## 2016-02-10 LAB — PROTEIN, TOTAL: TOTAL PROTEIN: 5.7 g/dL — AB (ref 6.5–8.1)

## 2016-02-10 LAB — ALBUMIN: Albumin: 3.3 g/dL — ABNORMAL LOW (ref 3.5–5.0)

## 2016-02-10 MED ORDER — IOPAMIDOL (ISOVUE-300) INJECTION 61%
INTRAVENOUS | Status: AC
  Administered 2016-02-10: 100 mL
  Administered 2016-02-10: 13:00:00
  Filled 2016-02-10: qty 100

## 2016-02-10 MED ORDER — LIDOCAINE HCL (PF) 1 % IJ SOLN
INTRAMUSCULAR | Status: AC
  Administered 2016-02-10: 10:00:00
  Filled 2016-02-10: qty 10

## 2016-02-10 MED ORDER — DIATRIZOATE MEGLUMINE & SODIUM 66-10 % PO SOLN
ORAL | Status: AC
  Administered 2016-02-10: 09:00:00
  Filled 2016-02-10: qty 30

## 2016-02-10 NOTE — Progress Notes (Addendum)
PROGRESS NOTE    Kyle Burke  [id number] DOB: September 12, 1945 DOA: 02/08/2016  PCP: Mack Hook, MD   Brief Narrative:  70 y/o male with non-ischemic cardiomyopathy, EF 20%, A-fib on Xarelto who has had fatigue with exertion and distension of his abdomen with a 20 lb weight gain. When asked specifically about dyspnea on exertion, he admits to having some but his main complaint is fatigue. When asked about being able to sleep laying flat, he is able to. No swelling in his legs but abdomen was quite swollen.  He had  URI about 1 mo ago and was simultaneously started on Aldactone which is when his fatigue started. He stopped his Aldactone about 2 wks ago without discussing with PCP. He feels this fatigue has worsened and his abdomen has distended.   Subjective: Doing much better. Abdomen is less swollen and fatigue is gone.   Assessment & Plan:   Principal Problem:   Acute on chronic systolic CHF? - has had weight gain and has and elevated BNP but lungs clear, not hypoxic, no orthopnea and no pedeal edema - recent ECHO was in March- EF 20%- repeat ECHO to look specifically for right heart failure  Active Problems:  Hyponatremia - fluid overloaded - improved with Lasix  Weight gain/ ascites - Abdominal swelling with alcohol use in the past - ultrasound of abdomen and CT abdomen negative for cirrhosis or other intraabdominal etiology for ascites - SAAG reveals ascites is due to CHF - cont Lasix IV - will need to continue both Lasix and Aldactone at home  Fatigue - TSH normal - no new medication changes - had recent URI- deconditioned?   - interestingly fatigue has resolved with diuresis    Atrial fibrillation (HCC) - Eliquis- Coreg  Heme occult + - on Xarelto- GI recommends outpt work up     Controlled type 2 diabetes mellitus with stage 3 chronic kidney disease, without long-term current use of insulin  - on Metformin which is on hold- cont sliding scale insulin    Essential hypertension -Coreg, Lisinopril    DVT prophylaxis: Eliquis Code Status: Full code Family Communication: daughter in law/ son/ wife Disposition Plan: home in 1-2 Consultants:   none Procedures:   none Antimicrobials:  Anti-infectives    None       Objective: Filed Vitals:   02/10/16 0621 02/10/16 0918 02/10/16 0937 02/10/16 1235  BP: 115/60 126/82 139/69 122/87  Pulse: 70   65  Temp: 98 F (36.7 C)   97.5 F (36.4 C)  TempSrc: Oral   Oral  Resp: 18   20  Height:      Weight: 110.496 kg (243 lb 9.6 oz)     SpO2: 97%   99%    Intake/Output Summary (Last 24 hours) at 02/10/16 1622 Last data filed at 02/10/16 1415  Gross per 24 hour  Intake   1900 ml  Output    850 ml  Net   1050 ml   Filed Weights   02/08/16 1824 02/09/16 0215 02/10/16 0621  Weight: 114.562 kg (252 lb 9 oz) 111.766 kg (246 lb 6.4 oz) 110.496 kg (243 lb 9.6 oz)    Examination: General exam: Appears comfortable  HEENT: PERRLA, oral mucosa moist, no sclera icterus or thrush Respiratory system: Clear to auscultation. Respiratory effort normal. Cardiovascular system: S1 & S2 heard, RRR.  No murmurs  Gastrointestinal system: Abdomen soft, non-tender, nondistended. Normal bowel sound. No organomegaly Central nervous system: Alert and oriented. No focal neurological deficits. Extremities:  No cyanosis, clubbing or edema Skin: No rashes or ulcers Psychiatry:  Mood & affect appropriate.     Data Reviewed: I have personally reviewed following labs and imaging studies  CBC:  Recent Labs Lab 02/08/16 1827 02/09/16 1155  WBC 7.2 5.3  HGB 14.0 12.3*  HCT 42.8 37.9*  MCV 89.5 89.6  PLT 192 0000000   Basic Metabolic Panel:  Recent Labs Lab 02/08/16 1827 02/09/16 1155 02/10/16 0410  NA 133* 138 135  K 4.9 3.9 3.8  CL 102 101 99*  CO2 26 29 30   GLUCOSE 109* 100* 92  BUN 22* 21* 19  CREATININE 1.29* 1.14 1.17  CALCIUM 9.2 9.2 8.8*   GFR: Estimated Creatinine Clearance: 81.1  mL/min (by C-G formula based on Cr of 1.17). Liver Function Tests:  Recent Labs Lab 02/08/16 1827 02/10/16 0814 02/10/16 0815  AST 28  --   --   ALT 32  --   --   ALKPHOS 81  --   --   BILITOT 1.7*  --   --   PROT 6.7  --  5.7*  ALBUMIN 3.8 3.3*  --    No results for input(s): LIPASE, AMYLASE in the last 168 hours. No results for input(s): AMMONIA in the last 168 hours. Coagulation Profile: No results for input(s): INR, PROTIME in the last 168 hours. Cardiac Enzymes: No results for input(s): CKTOTAL, CKMB, CKMBINDEX, TROPONINI in the last 168 hours. BNP (last 3 results) No results for input(s): PROBNP in the last 8760 hours. HbA1C: No results for input(s): HGBA1C in the last 72 hours. CBG:  Recent Labs Lab 02/09/16 1322 02/09/16 1654 02/09/16 2110 02/10/16 0654 02/10/16 1157  GLUCAP 110* 129* 144* 93 105*   Lipid Profile: No results for input(s): CHOL, HDL, LDLCALC, TRIG, CHOLHDL, LDLDIRECT in the last 72 hours. Thyroid Function Tests:  Recent Labs  02/09/16 0225  TSH 3.751   Anemia Panel:  Recent Labs  02/09/16 0225  FERRITIN 33   Urine analysis: No results found for: COLORURINE, APPEARANCEUR, LABSPEC, PHURINE, GLUCOSEU, HGBUR, BILIRUBINUR, KETONESUR, PROTEINUR, UROBILINOGEN, NITRITE, LEUKOCYTESUR Sepsis Labs: @LABRCNTIP (procalcitonin:4,lacticidven:4) ) Recent Results (from the past 240 hour(s))  Gram stain     Status: None   Collection Time: 02/10/16 10:19 AM  Result Value Ref Range Status   Specimen Description PARACENTESIS  Final   Special Requests NONE  Final   Gram Stain   Final    FEW WBC PRESENT,BOTH PMN AND MONONUCLEAR NO ORGANISMS SEEN    Report Status 02/10/2016 FINAL  Final         Radiology Studies: Dg Chest 2 View  02/08/2016  CLINICAL DATA:  Weakness, shortness of breath for 3 weeks EXAM: CHEST  2 VIEW COMPARISON:  01/13/2012 FINDINGS: Cardiomediastinal silhouette is stable. No pulmonary edema. There is streaky left base  retrocardiac atelectasis or early infiltrate. Degenerative changes thoracic spine. IMPRESSION: No pulmonary edema. Streaky left base retrocardiac atelectasis or early infiltrate. Degenerative changes thoracic spine. Electronically Signed   By: Lahoma Crocker M.D.   On: 02/08/2016 19:55   US Abdomen Complete  02/09/2016  CLINICAL DATA:  Abdominal distension for 3 weeks EXAM: ABDOMEN ULTRASOUND COMPLETE COMPARISON:  None. FINDINGS: Gallbladder: No gallstones are noted within gallbladder. There is thickening of gallbladder wall up to 6 mm. Small perihepatic and pericholecystic ascites. No sonographic Murphy's sign. Common bile duct: Diameter: 4.6 mm within normal limits. Liver: No focal lesion identified. Within normal limits in parenchymal echogenicity. IVC: Suboptimal visualization due to bowel gas Pancreas: Suboptimal  visualization due to bowel gas Spleen: Size and appearance within normal limits. Measures 7.3 cm in length Right Kidney: Length: 11.2 cm. Echogenicity within normal limits. No mass or hydronephrosis visualized. Left Kidney: Length: 12.2 cm. Echogenicity within normal limits. No mass or hydronephrosis visualized. Abdominal aorta: No aneurysm visualized. Measures up to 2.3 cm in diameter. Other findings: Abdominal ascites is noted. Suboptimal exam due to abundant bowel gas and patient body habitus. Bilateral pleural effusion. IMPRESSION: 1. No gallstones are noted within gallbladder. There is thickening of gallbladder wall up to 6 mm. No sonographic Murphy's sign. Small perihepatic and pericholecystic ascites. 2. No hydronephrosis. 3. No aortic aneurysm. 4. Abdominal ascites. Bilateral pleural effusion. Suboptimal exam due to abundant bowel gas pre Electronically Signed   By: Lahoma Crocker M.D.   On: 02/09/2016 16:52   Ct Abdomen Pelvis W Contrast  02/10/2016  CLINICAL DATA:  Ascites of uncertain etiology, shortness of breath with exertion, 20 pound weight gain, fatigue, type II diabetes mellitus,  essential hypertension, idiopathic cardiomyopathy, CHF, atrial fibrillation EXAM: CT ABDOMEN AND PELVIS WITH CONTRAST TECHNIQUE: Multidetector CT imaging of the abdomen and pelvis was performed using the standard protocol following bolus administration of intravenous contrast. Sagittal and coronal MPR images reconstructed from axial data set. CONTRAST:  1 ISOVUE-300 IOPAMIDOL (ISOVUE-300) INJECTION 61% IV. Dilute oral contrast. COMPARISON:  None FINDINGS: Lower chest: Small BILATERAL pleural effusions and minimal adjacent bibasilar atelectasis. Hepatobiliary: Liver and gallbladder normal appearance. Pancreas: Normal appearance Spleen: Small splenic clefts.  Otherwise normal appearance Adrenals/Urinary Tract: Mild thickening of adrenal glands without discrete mass/nodule. Symmetric nephrograms without renal mass, hydronephrosis or urinary tract calcification. Unremarkable ureters and bladder. Mild prostatic enlargement. Stomach/Bowel: Normal appendix. Redundant sigmoid colon. Large and small bowel loops otherwise normal appearance. Questionable wall thickening of the distal gastric antrum/pylorus, unable to exclude gastritis, ulcer disease, or mass. Vascular/Lymphatic: Coronary arterial calcifications. Atherosclerotic calcifications at the origins of the renal arteries and SMA. No aneurysm. No adenopathy. Reproductive: N/A Other: Scattered ascites.  No free intraperitoneal air or hernia. Musculoskeletal: Degenerative disc disease changes thoracolumbar spine. No acute osseous findings. IMPRESSION: Ascites of uncertain etiology; liver does not appear cirrhotic. Questionable wall thickening of the distal gastric antrum/ pylorus, cannot exclude gastritis, ulcer disease or mass; endoscopic assessment recommended. Small bibasilar pleural effusions and atelectasis. Coronary artery disease. Mild prostatic enlargement. Electronically Signed   By: Lavonia Dana M.D.   On: 02/10/2016 14:11   US Paracentesis  02/10/2016   INDICATION: ascites EXAM: ULTRASOUND-GUIDED PARACENTESIS COMPARISON:  None. MEDICATIONS: 10 cc 1% lidocaine COMPLICATIONS: None immediate. TECHNIQUE: Informed written consent was obtained from the patient after a discussion of the risks, benefits and alternatives to treatment. A timeout was performed prior to the initiation of the procedure. Initial ultrasound scanning demonstrates a small amount of ascites within the right upper abdominal quadrant. The right lower abdomen was prepped and draped in the usual sterile fashion. 1% lidocaine with epinephrine was used for local anesthesia. Under direct ultrasound guidance, a 19 gauge, 7-cm, Yueh catheter was introduced. An ultrasound image was saved for documentation purposed. The paracentesis was performed. The catheter was removed and a dressing was applied. The patient tolerated the procedure well without immediate post procedural complication. FINDINGS: A total of approximately 180 cc of dark yellow fluid was removed. Samples were sent to the laboratory as requested by the clinical team. IMPRESSION: Successful ultrasound-guided paracentesis yielding 180 cc of peritoneal fluid. Read by:  Lavonia Drafts [facility] Electronically Signed   By: Aletta Edouard  M.D.   On: 02/10/2016 10:19      Scheduled Meds: . carvedilol  12.5 mg Oral BID WC  . furosemide  40 mg Intravenous BID  . insulin aspart  0-5 Units Subcutaneous QHS  . insulin aspart  0-9 Units Subcutaneous TID WC  . lisinopril  40 mg Oral Daily  . potassium chloride  20 mEq Oral BID  . rivaroxaban  20 mg Oral Q supper   Continuous Infusions:    LOS: 1 day    Time spent in minutes: 68    Edroy, MD [facility] Pager: [email or web address] Password [facility] 02/10/2016, 4:22 PM

## 2016-02-10 NOTE — Progress Notes (Signed)
Pt will have HR drop to 37, 38, non-sustained, when resting HR 40-40's no s/s, pt still in Afib, MD notified, will continue to monitor, Thanks Arvella Nigh RN

## 2016-02-10 NOTE — Procedures (Signed)
   US guided RUQ paracentesis 180 cc dark yellow fluid obtained  Sent for labs per MD  Tolerated well

## 2016-02-10 NOTE — Progress Notes (Signed)
Patient to Ultrasound for Paracentesis via stretcher.. Stable condition with stable vitals. No distress. Pt to sign consent in Ultrasound, he is alert and oriented x4. Dr. Wynelle Cleveland spoke to patient and explained procedure further. Pt agrees with plan of care.  Pt did eat breakfast but he is NPO at this point for CT abdomen at 1300.

## 2016-02-10 NOTE — Consult Note (Signed)
Consultation  Referring Provider: [facility] [place] Primary Care Physician:  Mack Hook, MD Primary Gastroenterologist:  None/unassigned   Reason for Consultation:  New ascites  HPI: Kyle Burke is a 70 y.o. male , Hispanic non-English-speaking and history is obtained with his son interpreting. Patient has history of adult-onset diabetes mellitus and atrial fibrillation and was diagnosed with a cardiomyopathy about a year ago and has been followed by Dr. Cathie Olden. Up until this time he had been relatively asymptomatic. He was admitted on 02/08/2016 with progressive complaints of fatigue over the past several weeks, increased dyspnea with exertion and 20 pound weight gain. Patient feels that all the weight gain has been in his abdomen. There has been some confusion about medications but he tells me that he stopped taking furosemide at home and potassium several weeks ago because of fatigue. He apparently continued to spironolactone and other cardiac meds. He has never had ascites previously. Workup since presentation, with chest x-ray showing no evidence of pulmonary edema, upper abdominal ultrasound done yesterday showed gallbladder wall thickening but no cholelithiasis and common bile duct of 4.6 mm liver appears normal and ascites was noted. He has undergone diagnostic paracentesis this morning and fluid studies are pending He is scheduled for CT of the abdomen and pelvis this afternoon. Since admission with diuresis he already feels better. BNP on admission 2402, CBC within normal limits LFTs with total bili of 1.7 and direct bili of 1.4 Patient was recently documented Hemoccult  positive on outpatient basis earlier this week. He denies any GI symptoms, denies any heartburn indigestion or abdominal pain. Appetite is been fine, no melena or hematochezia. Patient is no history of known liver disease, cirrhosis or prior hepatitis. Family history is negative for liver disease  as far as he is aware. He stopped drinking alcohol altogether about 3 years ago and apparently did drink on weekends prior to that but his son says not heavily and not daily.   Past Medical History  Diagnosis Date  . Diabetes mellitus 2001  . Hypertension 2006  . Idiopathic cardiomyopathy ([place]) 11/01/2014    Followed by Dr. Acie Fredrickson, Cardiology  EF 25%  Adenosine Cardiolite did not support ischemic etiology  . Atrial fibrillation ([place]) 2016    Past Surgical History  Procedure Laterality Date  . Incise and drain abcess  1986    on leg  . Inguinal hernia repair  01/16/2012    Procedure: LAPAROSCOPIC INGUINAL HERNIA;  Surgeon: Gayland Curry, MD,FACS;  Location: [facility];  Service: General;  Laterality: Left;  Laparoscopic incarcerated Left inguinal hernia repair with mesh, umbilical hernia repair  . Umbilical hernia repair  01/16/2012    Procedure: HERNIA REPAIR UMBILICAL ADULT;  Surgeon: Gayland Curry, MD,FACS;  Location: [facility];  Service: General;  Laterality: N/A;    Prior to Admission medications   Medication Sig Start Date End Date Taking? Authorizing Provider  acetaminophen (TYLENOL) 650 MG CR tablet Take 650 mg by mouth every 8 (eight) hours as needed for pain.   Yes Historical Provider, MD  carvedilol (COREG) 12.5 MG tablet Take 1 tab by mouth in the morning and 2 tabs by mouth in the evening. Patient taking differently: Take 12.5 mg by mouth 2 (two) times daily with a meal.  08/06/15  Yes Mack Hook, MD  furosemide (LASIX) 40 MG tablet Take 0.5 tablets (20 mg total) by mouth daily. Patient taking differently: Take 20 mg by mouth daily.  12/25/15  Yes Thayer Headings, MD  lisinopril (PRINIVIL,ZESTRIL) 40 MG tablet Take 1 tablet (40 mg total) by mouth daily. 10/04/15  Yes Mack Hook, MD  metFORMIN (GLUCOPHAGE) 500 MG tablet Take 1 tablet (500 mg total) by mouth 2 (two) times daily with a meal. 01/17/16  Yes Mack Hook, MD  potassium chloride (K-DUR) 10 MEQ tablet Take 1  tablet (10 mEq total) by mouth daily. 12/05/15  Yes Thayer Headings, MD  rivaroxaban (XARELTO) 20 MG TABS tablet Take 1 tablet (20 mg total) by mouth daily with supper. 12/05/15  Yes Thayer Headings, MD    Current Facility-Administered Medications  Medication Dose Route Frequency Provider Last Rate Last Dose  . carvedilol (COREG) tablet 12.5 mg  12.5 mg Oral BID WC Edwin Dada, MD   12.5 mg at 02/09/16 1739  . diatrizoate meglumine-sodium (GASTROGRAFIN) 66-10 % solution           . furosemide (LASIX) injection 40 mg  40 mg Intravenous BID Edwin Dada, MD   40 mg at 02/10/16 0646  . insulin aspart (novoLOG) injection 0-5 Units  0-5 Units Subcutaneous QHS Edwin Dada, MD   0 Units at 02/09/16 2156  . insulin aspart (novoLOG) injection 0-9 Units  0-9 Units Subcutaneous TID WC Edwin Dada, MD   1 Units at 02/09/16 1739  . lisinopril (PRINIVIL,ZESTRIL) tablet 40 mg  40 mg Oral Daily Edwin Dada, MD   40 mg at 02/09/16 1037  . potassium chloride SA (K-DUR,KLOR-CON) CR tablet 20 mEq  20 mEq Oral BID Edwin Dada, MD   20 mEq at 02/09/16 2158  . rivaroxaban (XARELTO) tablet 20 mg  20 mg Oral Q supper Edwin Dada, MD   20 mg at 02/09/16 1739  . senna-docusate (Senokot-S) tablet 1 tablet  1 tablet Oral QHS PRN Edwin Dada, MD        Allergies as of 02/08/2016  . (No Known Allergies)    Family History  Problem Relation Age of Onset  . Stroke Mother   . Heart disease Mother   . Heart disease Father   . Diabetes Father   . Kidney disease Father     , history of stones as well  . Diabetes Brother   . Cancer Sister 57    unknown soft tissue cancer of leg    Social History   Social History  . Marital Status: Married    Spouse Name: N/A  . Number of Children: 5  . Years of Education: 9   Occupational History  . Concrete constrution     [employer]   Social History Main Topics  . Smoking status: Never Smoker    . Smokeless tobacco: Never Used  . Alcohol Use: No     Comment: rare occ., formerly more  . Drug Use: No  . Sexual Activity: Yes     Comment: But not regular   Other Topics Concern  . Not on file   Social History Narrative   Originally from [place]   Came to [place]. In 1995   Lives at home with wife, a son and his wife and 3 grandchildren.    Review of Systems: Pertinent positive and negative review of systems were noted in the above HPI section.  All other review of systems was otherwise negative.  Physical Exam: Vital signs in last 24 hours: Temp:  [98 F (36.7 C)-98.7 F (37.1 C)] 98 F (36.7 C) (06/25 0621) Pulse Rate:  [56-79] 70 (06/25 0621) Resp:  [  18] 18 (06/25 0621) BP: (115-139)/(60-85) 139/69 mmHg (06/25 0937) SpO2:  [96 %-100 %] 97 % (06/25 0621) Weight:  [243 lb 9.6 oz (110.496 kg)] 243 lb 9.6 oz (110.496 kg) (06/25 0621) Last BM Date: 02/08/16 General:   Alert,  Well-developed, well-nourished, pleasant and cooperative in NAD- Head:  Normocephalic and atraumatic. Eyes:  Sclera clear, no icterus.   Conjunctiva pink. Ears:  Normal auditory acuity. Nose:  No deformity, discharge,  or lesions. Mouth:  No deformity or lesions.   Neck:  Supple; no masses or thyromegaly. Lungs:  Clear throughout to auscultation.   No wheezes, crackles, or rhonchi.  Heart:  Regular rate and rhythm; Soft systolic murmur Abdomen:  Soft,nontender, BS active,nonpalp mass or hsm., Non-tense ascites  Rectal:  Deferred  Msk:  Symmetrical without gross deformities. . Pulses:  Normal pulses noted. Extremities: 1+ edema of feet and minimal pitting shins Neurologic:  Alert and  oriented x4;  grossly normal neurologically. Skin:  Intact without significant lesions or rashes.. Psych:  Alert and cooperative. Normal mood and affect.  Intake/Output from previous day: 06/24 0701 - 06/25 0700 In: 480 [P.O.:480] Out: 3525 [Urine:3525] Intake/Output this shift: Total I/O In: 240 [P.O.:240] Out:  -   Lab Results:  Recent Labs  02/08/16 1827 02/09/16 1155  WBC 7.2 5.3  HGB 14.0 12.3*  HCT 42.8 37.9*  PLT 192 178   BMET  Recent Labs  02/08/16 1827 02/09/16 1155 02/10/16 0410  NA 133* 138 135  K 4.9 3.9 3.8  CL 102 101 99*  CO2 26 29 30   GLUCOSE 109* 100* 92  BUN 22* 21* 19  CREATININE 1.29* 1.14 1.17  CALCIUM 9.2 9.2 8.8*   LFT  Recent Labs  02/08/16 1827 02/10/16 0815  PROT 6.7 5.7*  ALBUMIN 3.8  --   AST 28  --   ALT 32  --   ALKPHOS 81  --   BILITOT 1.7*  --   BILIDIR 0.3  --   IBILI 1.4*  --    PT/INR No results for input(s): LABPROT, INR in the last 72 hours.      IMPRESSION:  #21  71 year old Hispanic male, non-English speaking with new finding of ascites. Patient presented with progressive fatigue over the past several weeks increased dyspnea on exertion and weight gain of 20 pounds. He has known severe cardiomyopathy with EF of 20%. Language barrier has made communication more difficult but appears that he did stop his Lasix several weeks ago as he thought this was what was causing him to be fatigued, and this may certainly have accounted for his weight gain Suspect the ascites is secondary to congestive heart failure as he does also have some mild peripheral edema which she says has improved since admission Results of diagnostic paracentesis and CT of the abdomen and pelvis should offer significant information to rule out other etiologies #2 atrial fibrillation-on Xarelto #3 adult-onset diabetes mellitus #4 Hemoccult-positive stool/documented outpatient in setting of Xarelto. Hemoglobin normal, apparently no prior GI evaluation  PLAN: Will review peritoneal fluid results and calculate SAG Follow-up on CT of the abdomen and pelvis Continue diuresis Will consider endoscopic evaluation for Hemoccult-positive stools are not appropriate if he is in acute volume overload  Repeat BNP Thank you we will follow with you   Amy Esterwood   02/10/2016, 11:18 AM

## 2016-02-10 NOTE — Progress Notes (Signed)
Pt to CT for abdominal scan, in stable condition. Vitals stable. No distress. Taken via wheelchair. Pt has been NPO since 0900.

## 2016-02-10 NOTE — Progress Notes (Addendum)
Patient returned from CT, meal heated and given to patient.

## 2016-02-10 NOTE — Progress Notes (Signed)
Patient has started drinking oral contrast. Radiology updated. Pt due for CT at 1300.

## 2016-02-11 ENCOUNTER — Encounter: Payer: Self-pay | Admitting: Internal Medicine

## 2016-02-11 ENCOUNTER — Inpatient Hospital Stay (HOSPITAL_COMMUNITY): Payer: No Typology Code available for payment source

## 2016-02-11 DIAGNOSIS — I482 Chronic atrial fibrillation: Secondary | ICD-10-CM

## 2016-02-11 DIAGNOSIS — I509 Heart failure, unspecified: Secondary | ICD-10-CM

## 2016-02-11 DIAGNOSIS — I10 Essential (primary) hypertension: Secondary | ICD-10-CM

## 2016-02-11 DIAGNOSIS — E871 Hypo-osmolality and hyponatremia: Secondary | ICD-10-CM

## 2016-02-11 LAB — ECHOCARDIOGRAM COMPLETE
AVPHT: 502 ms
CHL CUP TV REG PEAK VELOCITY: 254 cm/s
FS: 14 % — AB (ref 28–44)
Height: 75 in
IVS/LV PW RATIO, ED: 0.9
LA ID, A-P, ES: 66 mm
LA vol index: 87.4 mL/m2
LADIAMINDEX: 2.87 cm/m2
LAVOL: 201 mL
LAVOLA4C: 207 mL
LEFT ATRIUM END SYS DIAM: 66 mm
LV PW d: 10 mm — AB (ref 0.6–1.1)
MV VTI: 133 cm
TR max vel: 254 cm/s
WEIGHTICAEL: 3572.8 [oz_av]

## 2016-02-11 LAB — IRON AND TIBC
Iron: 55 ug/dL (ref 45–182)
Saturation Ratios: 23 % (ref 17.9–39.5)
TIBC: 241 ug/dL — ABNORMAL LOW (ref 250–450)
UIBC: 186 ug/dL

## 2016-02-11 LAB — BASIC METABOLIC PANEL
Anion gap: 7 (ref 5–15)
BUN: 15 mg/dL (ref 6–20)
CO2: 31 mmol/L (ref 22–32)
CREATININE: 0.92 mg/dL (ref 0.61–1.24)
Calcium: 9.1 mg/dL (ref 8.9–10.3)
Chloride: 99 mmol/L — ABNORMAL LOW (ref 101–111)
GFR calc Af Amer: >60 mL/min (ref >60)
GLUCOSE: 84 mg/dL (ref 65–99)
Potassium: 3.7 mmol/L (ref 3.5–5.1)
SODIUM: 137 mmol/L (ref 135–145)

## 2016-02-11 LAB — VITAMIN B12: VITAMIN B 12: 697 pg/mL (ref 180–914)

## 2016-02-11 LAB — RETICULOCYTES
RBC.: 4.27 MIL/uL (ref 4.22–5.81)
RETIC CT PCT: 1.3 % (ref 0.4–3.1)
Retic Count, Absolute: 55.5 10*3/uL (ref 19.0–186.0)

## 2016-02-11 LAB — FOLATE: Folate: 22.1 ng/mL (ref >5.9)

## 2016-02-11 LAB — GLUCOSE, CAPILLARY: Glucose-Capillary: 92 mg/dL (ref 65–99)

## 2016-02-11 LAB — PATHOLOGIST SMEAR REVIEW: PATH REVIEW: REACTIVE

## 2016-02-11 LAB — FERRITIN: FERRITIN: 35 ng/mL (ref 24–336)

## 2016-02-11 MED ORDER — FUROSEMIDE 40 MG PO TABS: 40.0000 mg | ORAL_TABLET | Freq: Every day | ORAL | Status: DC

## 2016-02-11 NOTE — Progress Notes (Signed)
Daily Rounding Note  02/11/2016, 9:34 AM  LOS: 2 days   SUBJECTIVE:       Feels ok.  No abdominal pain or n/v.  Eating well.   OBJECTIVE:         Vital signs in last 24 hours:    Temp:  [97.5 F (36.4 C)-98.5 F (36.9 C)] 97.8 F (36.6 C) (06/26 0557) Pulse Rate:  [54-82] 82 (06/26 0557) Resp:  [20] 20 (06/26 0557) BP: (121-139)/(69-87) 132/80 mmHg (06/26 0557) SpO2:  [94 %-100 %] 94 % (06/26 0557) Weight:  [101.288 kg (223 lb 4.8 oz)] 101.288 kg (223 lb 4.8 oz) (06/26 0557) Last BM Date: 02/10/16 Filed Weights   02/09/16 0215 02/10/16 0621 02/11/16 0557  Weight: 111.766 kg (246 lb 6.4 oz) 110.496 kg (243 lb 9.6 oz) 101.288 kg (223 lb 4.8 oz)   General: pleasant, looks well.    Heart: RRR Chest: clear bil.   No SOB or cough.    Abdomen: soft, NT.  ND.  No HSM.  BS hypoactive  Extremities:  No CCE Neuro/Psych:  Pleasant, calm.  Moves all 4s.  No gross deficits. Oriented x 3.    Intake/Output from previous day: 06/25 0701 - 06/26 0700 In: 2120 [P.O.:2120] Out: 802 [Urine:800; Stool:2]  Intake/Output this shift:    Lab Results:  Recent Labs  02/08/16 1827 02/09/16 1155  WBC 7.2 5.3  HGB 14.0 12.3*  HCT 42.8 37.9*  PLT 192 178   BMET  Recent Labs  02/09/16 1155 02/10/16 0410 02/11/16 0542  NA 138 135 137  K 3.9 3.8 3.7  CL 101 99* 99*  CO2 29 30 31   GLUCOSE 100* 92 84  BUN 21* 19 15  CREATININE 1.14 1.17 0.92  CALCIUM 9.2 8.8* 9.1   LFT  Recent Labs  02/08/16 1827 02/10/16 0814 02/10/16 0815  PROT 6.7  --  5.7*  ALBUMIN 3.8 3.3*  --   AST 28  --   --   ALT 32  --   --   ALKPHOS 81  --   --   BILITOT 1.7*  --   --   BILIDIR 0.3  --   --   IBILI 1.4*  --   --    PT/INR No results for input(s): LABPROT, INR in the last 72 hours. Hepatitis Panel No results for input(s): HEPBSAG, HCVAB, HEPAIGM, HEPBIGM in the last 72 hours.  Studies/Results: US Abdomen Complete  02/09/2016   CLINICAL DATA:  Abdominal distension for 3 weeks EXAM: ABDOMEN ULTRASOUND COMPLETE COMPARISON:  None. FINDINGS: Gallbladder: No gallstones are noted within gallbladder. There is thickening of gallbladder wall up to 6 mm. Small perihepatic and pericholecystic ascites. No sonographic Murphy's sign. Common bile duct: Diameter: 4.6 mm within normal limits. Liver: No focal lesion identified. Within normal limits in parenchymal echogenicity. IVC: Suboptimal visualization due to bowel gas Pancreas: Suboptimal visualization due to bowel gas Spleen: Size and appearance within normal limits. Measures 7.3 cm in length Right Kidney: Length: 11.2 cm. Echogenicity within normal limits. No mass or hydronephrosis visualized. Left Kidney: Length: 12.2 cm. Echogenicity within normal limits. No mass or hydronephrosis visualized. Abdominal aorta: No aneurysm visualized. Measures up to 2.3 cm in diameter. Other findings: Abdominal ascites is noted. Suboptimal exam due to abundant bowel gas and patient body habitus. Bilateral pleural effusion. IMPRESSION: 1. No gallstones are noted within gallbladder. There is thickening of gallbladder wall up to 6 mm. No sonographic Murphy's sign.  Small perihepatic and pericholecystic ascites. 2. No hydronephrosis. 3. No aortic aneurysm. 4. Abdominal ascites. Bilateral pleural effusion. Suboptimal exam due to abundant bowel gas pre Electronically Signed   By: Lahoma Crocker M.D.   On: 02/09/2016 16:52   Ct Abdomen Pelvis W Contrast  02/10/2016  CLINICAL DATA:  Ascites of uncertain etiology, shortness of breath with exertion, 20 pound weight gain, fatigue, type II diabetes mellitus, essential hypertension, idiopathic cardiomyopathy, CHF, atrial fibrillation EXAM: CT ABDOMEN AND PELVIS WITH CONTRAST TECHNIQUE: Multidetector CT imaging of the abdomen and pelvis was performed using the standard protocol following bolus administration of intravenous contrast. Sagittal and coronal MPR images reconstructed from  axial data set. CONTRAST:  1 ISOVUE-300 IOPAMIDOL (ISOVUE-300) INJECTION 61% IV. Dilute oral contrast. COMPARISON:  None FINDINGS: Lower chest: Small BILATERAL pleural effusions and minimal adjacent bibasilar atelectasis. Hepatobiliary: Liver and gallbladder normal appearance. Pancreas: Normal appearance Spleen: Small splenic clefts.  Otherwise normal appearance Adrenals/Urinary Tract: Mild thickening of adrenal glands without discrete mass/nodule. Symmetric nephrograms without renal mass, hydronephrosis or urinary tract calcification. Unremarkable ureters and bladder. Mild prostatic enlargement. Stomach/Bowel: Normal appendix. Redundant sigmoid colon. Large and small bowel loops otherwise normal appearance. Questionable wall thickening of the distal gastric antrum/pylorus, unable to exclude gastritis, ulcer disease, or mass. Vascular/Lymphatic: Coronary arterial calcifications. Atherosclerotic calcifications at the origins of the renal arteries and SMA. No aneurysm. No adenopathy. Reproductive: N/A Other: Scattered ascites.  No free intraperitoneal air or hernia. Musculoskeletal: Degenerative disc disease changes thoracolumbar spine. No acute osseous findings. IMPRESSION: Ascites of uncertain etiology; liver does not appear cirrhotic. Questionable wall thickening of the distal gastric antrum/ pylorus, cannot exclude gastritis, ulcer disease or mass; endoscopic assessment recommended. Small bibasilar pleural effusions and atelectasis. Coronary artery disease. Mild prostatic enlargement. Electronically Signed   By: Lavonia Dana M.D.   On: 02/10/2016 14:11   US Paracentesis  02/10/2016  INDICATION: ascites EXAM: ULTRASOUND-GUIDED PARACENTESIS COMPARISON:  None. MEDICATIONS: 10 cc 1% lidocaine COMPLICATIONS: None immediate. TECHNIQUE: Informed written consent was obtained from the patient after a discussion of the risks, benefits and alternatives to treatment. A timeout was performed prior to the initiation of the  procedure. Initial ultrasound scanning demonstrates a small amount of ascites within the right upper abdominal quadrant. The right lower abdomen was prepped and draped in the usual sterile fashion. 1% lidocaine with epinephrine was used for local anesthesia. Under direct ultrasound guidance, a 19 gauge, 7-cm, Yueh catheter was introduced. An ultrasound image was saved for documentation purposed. The paracentesis was performed. The catheter was removed and a dressing was applied. The patient tolerated the procedure well without immediate post procedural complication. FINDINGS: A total of approximately 180 cc of dark yellow fluid was removed. Samples were sent to the laboratory as requested by the clinical team. IMPRESSION: Successful ultrasound-guided paracentesis yielding 180 cc of peritoneal fluid. Read by:  Lavonia Drafts [facility] Electronically Signed   By: Aletta Edouard M.D.   On: 02/10/2016 10:19    ASSESMENT:   *  New onset ascites.  S/p 6/25 180 cc paracentesis.  WBCs 318: so no SBP.  Updated SAG score using blood and ascites albumins obtained on 6/25 is 1.1, unlikely this is portal htn driven. Non cirrhotic looking liver on CT and ultrasound.  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    *  FOBT +.  Abnormal looking stomach on CT.  Not iron deficient or anemic at baseline.   *   Idiopathic CM.  CHF with BNP 1573. EF 20 %.  Volume overload.  Note diuretics down to Lasix only.  Weight change from 252 to 223# over 3 days with no impact on renal function!!!  *  Chronic Xarelto for A fib.    *  prostamegaly per CT, PSA 1.3.     PLAN   *  Has ROV with Dr Hilarie Fredrickson on 8/29 at 10 AM.  At that point decision can be made in regards to pursuing colon +/- EGD.  This was d/w pt and his family via Electronics [employer].  Dtr speaks [place] as well.    *  Cardiologist is Dr Acie Fredrickson, should probably have follow up with him.     Azucena Freed  02/11/2016, 9:34 AM Pager: [phone]   Attending physician's note   I have  reviewed the chart. I agree with the Advanced Practitioner's note, impression and recommendations. Based on borderline high SAAG and high ascitic fluid protein is consistent with cardiac ascites. Patient was discharge home before I saw or examined him  K Denzil Magnuson, MD [phone] Mon-Fri 8a-5p [phone] after 5p, weekends, holidays

## 2016-02-11 NOTE — Discharge Instructions (Signed)
Ascitis (Ascites) La ascitis es la acumulacin de exceso de lquido en el abdomen. Puede ser leve o grave y empeorar si no se Personal assistant. CAUSAS Las causas posibles son las siguientes:  Cirrosis. Esta es la causa ms frecuente de ascitis.  Infeccin o inflamacin abdominal.  Cncer en el abdomen.  Insuficiencia cardaca.  Enfermedades renales.  Inflamacin del pncreas.  Cogulos en las venas del hgado. [place] signos y sntomas se incluyen los siguientes:  Sensacin de saciedad en el abdomen. Esto es frecuente.  Aumento del tamao del abdomen o la cintura.  [place].  Hinchazn del escroto en los hombres.  Dificultad para respirar.  Dolor abdominal.  Aumento repentino de [place]. Si la enfermedad es leve, es posible que no tenga sntomas. DIAGNSTICO Para realizar un diagnstico, el mdico:  Le preguntar su historia clnica.  Le realizar un examen fsico.  Pedir estudios de diagnstico por imgenes, como una ecografa o una tomografa computarizada del abdomen. TRATAMIENTO El tratamiento depende de la causa de la ascitis. Puede incluir lo siguiente:  Tomar un medicamento que lo haga orinar, llamado diurtico.  Reducir estrictamente el consumo de sal (sodio). La sal puede causar la acumulacin del exceso de lquido en el cuerpo, lo que empeora la ascitis.  Someterse a un procedimiento para extraer el lquido del abdomen (paracentesis abdominal).  Someterse a un procedimiento para pasar el lquido del abdomen a una vena.  Someterse a un procedimiento que [employer] de las principales venas del hgado y [place] la presin sobre este rgano (procedimiento de derivacin portosistmica intraheptica transyugular, DPIT). La ascitis puede desaparecer o mejorar con el tratamiento de la afeccin que la caus.  INSTRUCCIONES PARA EL CUIDADO EN EL HOGAR  Lleve un registro de [employer]. Para hacerlo, psese a la [employer] y registre su peso.  Lleve un registro de la cantidad de lquido que bebe y los cambios en la cantidad que [place].  Siga las indicaciones del mdico respecto de la cantidad de lquido que puede beber.  Intente no comer comidas saladas (con alto contenido de sodio).  Tome los medicamentos solamente como se lo haya indicado el mdico.  Concurra a todas las visitas de control como se lo haya indicado el mdico. Esto es importante.  Informe al mdico si hay cambios en su salud, especialmente si tiene sntomas nuevos o los sntomas empeoran. SOLICITE ATENCIN MDICA SI:  Aumenta ms de 3libras en 3das.  Hay un aumento del tamao del abdomen o la cintura.  Tiene un nuevo episodio de hinchazn en las piernas.  La hinchazn de las piernas empeora. SOLICITE ATENCIN MDICA DE INMEDIATO SI:  Tiene fiebre.  Se siente confundido.  Presenta nuevas dificultades para respirar o la dificultad empeora.  Tiene un nuevo episodio de dolor abdominal o el dolor empeora.  Tiene un nuevo episodio de hinchazn del escroto (en los hombres).   Esta informacin no tiene Marine scientist el consejo del mdico. Asegrese de hacerle al mdico cualquier pregunta que tenga.   Document Released: 08/04/2005 Document Revised: 08/25/2014 Elsevier Interactive Patient Education 2016 [employer]. Rivaroxaban oral tablets Qu es este medicamento? El RIVAROXABN es un anticoagulante (diluyente de la [place]). Se utiliza para tratar cogulos sanguneos en los pulmones o venas. Se utiliza tambin despus de una operacin de rodilla o cadera para evitar cogulos sanguneos. Se utiliza tambin para reducir la posibilidad de derrame cerebral en los pacientes con una afeccin mdica llamada fibrilacin auricular.  Este medicamento puede ser utilizado para otros usos; si tiene alguna pregunta consulte con su proveedor de atencin mdica o con su farmacutico. Qu le debo informar a mi profesional de  la salud antes de tomar este medicamento? Necesita saber si usted presenta alguno de los siguientes problemas o situaciones: trastornos de sangrado sangrado en el cerebro sangre en las heces (heces de color oscuro o de aspecto alquitranado) o si vomita sangre antecedentes de sangrado estomacal enfermedad renal enfermedad heptica conteos sanguneos bajos, como baja cantidad de glbulos blancos, glbulos rojos y plaquetas procedimiento epidural o vertebral reciente o programado toma medicamentos que tratan o previenen cogulos sanguneos una reaccin alrgica o inusual al rivaroxabn, otros medicamentos, alimentos, colorantes o conservantes si est embarazada o buscando quedar embarazada si est amamantando a un beb Cmo debo utilizar este medicamento? Tome este medicamento por va oral con un vaso de agua. Siga las instrucciones de la etiqueta del [place]. Tome su medicamento a intervalos regulares. No tome su medicamento con una frecuencia mayor a la indicada. No deje de tomarlo excepto si as lo indica su mdico. El dejar de tomar este medicamento puede aumentar su riesgo de cogulos sanguneos. Asegurarse de rellenar su receta antes de quedarse sin medicamento. Si est tomando este medicamento despus de [place] de reemplazo de cadera o rodilla, tmelo con o sin alimentos. Si est tomando este medicamento para fibrilacin auricular, tmelo con la cena (la ltima comida del da). Si est tomando este medicamento para tratar cogulos sanguneos, tmelo con alimentos a la Teacher, early years/pre. Si no puede tragar la tableta, puede triturar la tableta y agregarla con pur de [place]. Luego, come el pur de [employer]. Debe comer mas comida despus de comer el pur de manzana con la tableta triturada. Hable con su pediatra para informarse acerca del uso de este medicamento en nios. Puede requerir atencin especial. Sobredosis: Pngase en contacto inmediatamente con un centro toxicolgico o una  sala de urgencia si usted cree que haya tomado demasiado medicamento. ATENCIN: [employer] es solo para usted. No comparta este medicamento con nadie. Qu sucede si me olvido de una dosis? Si toma su medicamento una vez al da y se olvida una dosis, tome la dosis [place] tan pronto como se acuerde. Si toma su [place] y se Charissa Bash dosis, tome la dosis [place] inmediatamente. En este caso, puede tomarse 2 tabletas al [employer]. Al da siguiente debe tomar 1 tableta dos veces al [employer] indicado. Qu puede interactuar con este medicamento? aspirina o medicamentos tipo aspirina ciertos antibiticos tales como eritromicina, azitromicina y claritromicina ciertos medicamentos para infecciones micticas tales como quetoconazol e itraconazol ciertos medicamentos para pulso cardiaco irregular tales como Terminous, quinidina, dronedarona ciertos medicamentos para convulsiones tales como carbamazepina, fenitona conivaptn diltiazem felodipino indinavir lopinavir; ritonavir los AINE, medicamentos para Conservation officer, historic buildings o inflamacin, como ibuprofeno o naproxeno ranolazina rifampicina ritonavir hierba de [place] verapamilo Puede ser que esta lista no menciona todas las posibles interacciones. Informe a su profesional de KB Home	[place] de [employer] productos a base de hierbas, medicamentos de [place] o suplementos nutritivos que est tomando. Si usted fuma, consume bebidas alcohlicas o si utiliza drogas ilegales, indqueselo tambin a su profesional de KB Home	[place]. Algunas sustancias pueden interactuar con su medicamento. A qu debo estar atento al usar Coca[employer]a? Visite a su mdico o a su profesional de la salud para chequear su evolucin peridicamente. Se supervisar su estado de salud atentamente mientras reciba Cesar Chavez  medicamento. Notifique a su mdico o profesional de la salud y consigue tratamiento de emergencia si desarrolla problemas respiratorios; cambios en la visin; dolor en  el pecho; dolor de cabeza grave, repentino; dolor, hinchazn, o calidez en la pierna, dificultar para hablar; entumecimiento o debilidad repentina de la cara, brazo o pierna. Estos pueden ser signos que su afeccin haya empeorado. Si va a someterse a una operacin, informe a su mdico o a su profesional de la salud que est tomando [employer]. Informe a su profesional de la salud que tomas este medicamento antes de someterse a un procedimiento espinal o epidural. A veces las personas que toman este medicamento tienen problemas de sangrado alrededor de la columna vertebral cuando tienen un procedimiento espinal o epidural. Este sangrado es muy raro. Si usted tiene un procedimiento espinal o epidural, mientras est tomando Coca[employer]Cola, comunquese con su profesional de la salud inmediatamente si usted tiene dolor de espalda, entumecimiento o hormigueo (especialmente en las piernas y pies), debilidad muscular, parlisis o prdida del control intestinal o de la vejiga. Evite los deportes y [employer] puedan provocar lesiones mientras est tomando este medicamento. Las cadas o lesiones severas pueden provocar sangrados que no pueden verse. Proceda con cuidado al utilizar herramientas o cuchillos afilados. Considere la posibilidad de Risk manager una Diplomatic Services operational officer. Tome precauciones especiales al [employer] dientes o al limpiarlos con hilo dental. Informe a su mdico o a su profesional de la salud si observa lesiones, magulladuras o manchas rojas en la piel. Qu efectos secundarios puedo tener al [employer] este medicamento? Efectos secundarios que debe informar a su mdico o a Barrister's clerk de la salud tan pronto como sea posible: Chief of Staff como erupcin cutnea, picazn o urticarias, hinchazn de la cara, labios o lengua dolor de espalda enrojecimiento, formacin de ampollas, descamacin o distensin de la piel, inclusive dentro de la boca signos y sntomas de sangrado tales  como heces de color oscuro, con sangre o con aspecto alquitranado; orina roja o marrn oscura; escupir sangre o material marrn que parece granos de caf; puntos rojos en la piel; sangrado o magulladuras inusuales de los ojos, encas o nariz Efectos secundarios que, por lo general, no requieren atencin mdica (debe informarlos a su mdico o a Barrister's clerk de la salud si persisten o si son molestos): Leisure centre manager Puede ser que esta lista no menciona todos los posibles efectos secundarios. Comunquese a su mdico por asesoramiento mdico [employer]. Usted puede informar los efectos secundarios a la FDA por telfono al 1-800-FDA-1088. Dnde debo guardar mi medicina? Mantngala fuera del alcance de los nios. Gurdela a [employer], entre 15 y 65 grados C (50 y 57 grados F). Deseche todo el medicamento que no haya utilizado, despus de la fecha de vencimiento. ATENCIN: Este folleto es un resumen. Puede ser que no cubra toda la posible informacin. Si usted tiene preguntas acerca de esta medicina, consulte con su mdico, su farmacutico o su profesional de Technical sales engineer.    2016, Elsevier/Gold Standard. (2014-09-27 00:00:00) Furosemide tablets Qu es este medicamento? La FUROSEMIDA es un diurtico. Ayuda a incrementar el volumen de [place], lo que provoca que el cuerpo pierda sal y agua que estn en exceso. Este medicamento se [place] para tratar la alta presin sangunea, edema o hinchazn causadas por enfermedades cardacas, hepticas o renales. Este medicamento puede ser utilizado para otros usos; si tiene alguna pregunta consulte con su proveedor de atencin mdica o con su farmacutico. Qu le debo informar  a mi profesional de la salud antes de tomar este medicamento? Necesitan saber si usted presenta alguno de los siguientes problemas o situaciones: nivel anormal de electrolitos en la sangre diarrea o vmito gota enfermedad cardiaca enfermedad renal, bajo  volumen de orina o dificultad para orinar enfermedad heptica enfermedad tiroidea una reaccin alrgica o inusual a la furosemida, sulfonamidas, a otros medicamentos, alimentos, colorantes o conservantes si est embarazada o buscando quedar embarazada si est amamantando a un beb Cmo debo utilizar este medicamento? Tome este medicamento por va oral con un vaso de agua. Siga las instrucciones de la etiqueta del [place]. Este medicamento se puede tomar con o sin alimentos. Si le produce Higher education careers adviser, tmelo con alimentos o con [employer]. No tome su medicamento con una frecuencia mayor que la indicada. Recuerde que Lexicographer con frecuencia despus de Systems developer. No tome sus dosis en horarios que le causen problemas. No lo tome a la hora de [employer]. Hable con su pediatra para informarse acerca del uso de este medicamento en nios. Aunque este medicamento se puede recetar para condiciones selectivas, las precauciones se aplican. Sobredosis: Pngase en contacto inmediatamente con un centro toxicolgico o una sala de urgencia si usted cree que haya tomado demasiado medicamento. ATENCIN: [employer] es solo para usted. No comparta este medicamento con nadie. Qu sucede si me olvido de una dosis? Si olvida una dosis, tmela lo antes posible. Si es casi la hora de la prxima dosis, tome slo esa dosis. No tome dosis adicionales o dobles. Qu puede interactuar con este medicamento? aspirina y medicamentos tipo aspirina ciertos antibiticos hidrato de cloral cisplatino ciclosporina digoxina diurticos laxantes litio medicamentos para la presin sangunea medicamentos para Media planner los msculos antes de una ciruga metotrexato los [place], medicamentos para Conservation officer, historic buildings o la inflamacin, como ibuprofeno, naproxeno o indometacina fenitona medicamentos esteroideos, como la prednisona o la cortisona sucralfato hormonas tiroideas Puede ser que esta lista no menciona todas las posibles  interacciones. Informe a su profesional de KB Home	[place] de [employer] productos a base de hierbas, medicamentos de [place] o suplementos nutritivos que est tomando. Si usted fuma, consume bebidas alcohlicas o si utiliza drogas ilegales, indqueselo tambin a su profesional de KB Home	[place]. Algunas sustancias pueden interactuar con su medicamento. A qu debo estar atento al usar Coca-Cola? Visite a su mdico o a su profesional de la salud para chequear su evolucin peridicamente. Controle su presin sangunea regularmente. Pregunte a su mdico o a su profesional de la salud cul debe ser su presin sangunea y cundo deber comunicarse con l/ella. Si es diabtico controle su nivel de azcar en la sangre como le haya indicado. Puede necesitar seguir una dieta especial mientras est tomando este medicamento. Consulte a su mdico acerca de esto. Tambin, pregunte a su mdico cunto lquido debe beber [employer]. No debe deshidratarse. Puede experimentar somnolencia o mareos. No conduzca ni utilice maquinaria, ni haga nada que Associate Professor en estado de alerta hasta que sepa cmo le afecta este medicamento. No se siente ni se ponga de pie con rapidez, especialmente si es un paciente de edad avanzada. Esto reduce el riesgo de mareos o [employer]. El alcohol puede aumentar su somnolencia y [place]. Evite consumir bebidas alcohlicas. Este medicamento puede aumentar su sensibilidad al sol. Mantngase fuera de Administrator. Si no lo puede evitar, utilice ropa protectora y crema de Photographer. No utilice lmparas solares, camas solares ni cabinas solares. Qu efectos secundarios puedo tener al [employer] este medicamento? Efectos  secundarios que debe informar a su mdico o a Barrister's clerk de la salud tan pronto como sea posible: -sangre en la orina o en sus heces -boca seca -fiebre o escalofros -prdida de la audicin, zumbido en los odos -latidos cardiacos irregulares -calambre, Social research officer, government o debilidad  muscular -erupcin cutnea -malestar o dolor estomacal, nuseas -hormigueo o entumecimiento de manos o pies -cansancio o debilidad inusual -diarrea o vmito -color amarillento de ojos o piel Efectos secundarios que, por lo general, no requieren atencin mdica (debe informarlos a su mdico o a su profesional de la salud si persisten o si son molestos): -dolor de cabeza -prdida del apetito -sangrado o magulladuras inusuales Puede ser que esta lista no menciona todos los posibles efectos secundarios. Comunquese a su mdico por asesoramiento mdico [employer]. Usted puede informar los efectos secundarios a la FDA por telfono al 1-800-FDA-1088. Dnde debo guardar mi medicina? Mantngala fuera del alcance de los nios. Gurdela a [employer], entre 15 y 69 grados C (52 y 75 grados F). Protjala de la luz. Deseche los medicamentos que no haya utilizado, despus de la fecha de vencimiento. ATENCIN: Este folleto es un resumen. Puede ser que no cubra toda la posible informacin. Si usted tiene preguntas acerca de esta medicina, consulte con su mdico, su farmacutico o su profesional de Technical sales engineer.    2016, Elsevier/Gold Standard. (2014-12-15 00:00:00)

## 2016-02-11 NOTE — Progress Notes (Signed)
  Echocardiogram 2D Echocardiogram has been performed.  Diamond Nickel 02/11/2016, 10:31 AM

## 2016-02-11 NOTE — Progress Notes (Signed)
Pt HR dropped to 34, nonsustained, had a pause of 2.32 sec, then back to 40-60's no s/s, notified on call MD, will  continue to monitor, Thanks Arvella Nigh RN

## 2016-02-11 NOTE — Care Management Note (Signed)
Case Management Note  Patient Details  Name: Kyle Burke MRN: [id number] Date of Birth: 02-15-1946  Subjective/Objective:    Admitted with CHF                Action/Plan: Talked to patient via his daughter in law - fluent in [place]. PCP is Dr Mack Hook; patient [facility] to the [facility] Address: [address], [place], [place] [address]  Phone: [phone] Pharmacy of choice is [facility] for his Xarelto and [facility] for his other medication. Independent of his ADL; eats a heart health diet low in sodium and is active as much as possible. Lives at home with spouse;  No needs identified at this time. CM will continue to follow for DCP   Expected Discharge Date:   possible 02/11/2016               Expected Discharge Plan:  Home/Self Care  Discharge planning Services  CM Consult     Status of Service:  Completed, signed off  Sherrilyn Rist [id number] 02/11/2016, 11:17 AM

## 2016-02-11 NOTE — Discharge Summary (Signed)
Physician Discharge Summary  Kyle Burke [id number] DOB: [birth year] DOA: 02/08/2016  PCP: Mack Hook, MD  Admit date: 02/08/2016 Discharge date: 02/11/2016  Recommendations for Outpatient Follow-up:   Please note we increased Lasix from 20 mg a day to 40 mg a day. Creatinine was within normal limits during this hospital stay.  Discharge Diagnoses:  Principal Problem:   Acute on chronic systolic CHF (congestive heart failure) (HCC) Active Problems:   Atrial fibrillation (HCC)   Controlled type 2 diabetes mellitus with stage 3 chronic kidney disease, without long-term current use of insulin (HCC)   Essential hypertension   Hyponatremia   Abdominal distension   Ascites    Discharge Condition: stable   Diet recommendation: as tolerated   History of present illness:  Per brief narrative 02/10/2016 "70 y/o male with non-ischemic cardiomyopathy, EF 20%, A-fib on Xarelto who has had fatigue with exertion and distension of his abdomen with a 20 lb weight gain. When asked specifically about dyspnea on exertion, he admits to having some but his main complaint is fatigue. When asked about being able to sleep laying flat, he is able to. No swelling in his legs but abdomen was quite swollen.  He had URI about 1 mo ago and was simultaneously started on Aldactone which is when his fatigue started. He stopped his Aldactone about 2 wks ago without discussing with PCP. He feels this fatigue has worsened and his abdomen has distended."  Hospital Course:   Assessment & Plan:  Principal Problem:  Acute on chronic systolic CHF - Recent ECHO was in March- EF 20% - 2 D ECHO on this admission pending, will follow up prior to discharge and will let pt know of results - He will need to continue lasix at 40 mg daily on discharge instead of 20 mg  - He needs to follow-up with primary care physician, cardiology  Active Problems: Hyponatremia - Likely secondary to CHF and liver  cirrhosis etiology - Sodium subsequently normalize, 137 prior to discharge  Weight gain/ ascites likely due to portal hypertension  - Abdominal swelling with alcohol use in the past - Ultrasound of abdomen and CT abdomen negative for cirrhosis or other intraabdominal etiology for ascites - SAAG reveals ascites is due to CHF - Pt to continue lasix 40 gm daily  - He stopped spironolactone before   Fatigue - TSH normal - Feels better   Atrial fibrillation (HCC) - CHADS vasc score 2 - On AC with Xarelto  - Rate controlled with coreg   Heme occult positive - Outpatient follow-up colonoscopy on outpatient basis  Controlled type 2 diabetes mellitus with stage 3 chronic kidney disease, without long-term current use of insulin  - Resume metformin on discharge  Essential hypertension - Resume Coreg and lisinopril on discharge    DVT prophylaxis: On anticoagulation with xarelto  Code Status: Full code Family Communication: wife at the bedside    Consultants:   GI  Procedures:   None   Antimicrobials:   None      Signed:  Leisa Lenz, MD  [facility] 02/11/2016, 11:51 AM  Pager #: [phone]  Time spent in minutes: less than 30 minutes  Discharge Exam: Filed Vitals:   02/11/16 0107 02/11/16 0557  BP: 125/80 132/80  Pulse:  82  Temp: 98.4 F (36.9 C) 97.8 F (36.6 C)  Resp: 20 20   Filed Vitals:   02/10/16 2100 02/10/16 2140 02/11/16 0107 02/11/16 0557  BP: 121/75  125/80 132/80  Pulse: 54 59  82  Temp: 98.5 F (36.9 C)  98.4 F (36.9 C) 97.8 F (36.6 C)  TempSrc: Oral  Oral Oral  Resp: 20  20 20   Height:      Weight:    101.288 kg (223 lb 4.8 oz)  SpO2: 99%  100% 94%    General: Pt is alert, follows commands appropriately, not in acute distress Cardiovascular: Regular rate and rhythm, S1/S2 + Respiratory: Clear to auscultation bilaterally, no wheezing, no crackles, no rhonchi Abdominal: Soft, non tender, non distended, bowel  sounds +, no guarding Extremities: no edema, no cyanosis, pulses palpable bilaterally DP and PT Neuro: Grossly nonfocal  Discharge Instructions:    Medication List    TAKE these medications        acetaminophen 650 MG CR tablet  Commonly known as:  TYLENOL  Take 650 mg by mouth every 8 (eight) hours as needed for pain.     carvedilol 12.5 MG tablet  Commonly known as:  COREG  Take 1 tab by mouth in the morning and 2 tabs by mouth in the evening.     furosemide 40 MG tablet  Commonly known as:  LASIX  Take 1 tablet (40 mg total) by mouth daily.     lisinopril 40 MG tablet  Commonly known as:  PRINIVIL,ZESTRIL  Take 1 tablet (40 mg total) by mouth daily.     metFORMIN 500 MG tablet  Commonly known as:  GLUCOPHAGE  Take 1 tablet (500 mg total) by mouth 2 (two) times daily with a meal.     potassium chloride 10 MEQ tablet  Commonly known as:  K-DUR  Take 1 tablet (10 mEq total) by mouth daily.     rivaroxaban 20 MG Tabs tablet  Commonly known as:  XARELTO  Take 1 tablet (20 mg total) by mouth daily with supper.            Follow-up Information    Follow up with [facility], MD. Schedule an appointment as soon as possible for a visit in 1 week.   Specialty:  Internal Medicine   Why:  Follow up appt after recent hospitalization   Contact information:   [place] [address] [phone]        The results of significant diagnostics from this hospitalization (including imaging, microbiology, ancillary and laboratory) are listed below for reference.    Significant Diagnostic Studies: Dg Chest 2 View  02/08/2016  CLINICAL DATA:  Weakness, shortness of breath for 3 weeks EXAM: CHEST  2 VIEW COMPARISON:  01/13/2012 FINDINGS: Cardiomediastinal silhouette is stable. No pulmonary edema. There is streaky left base retrocardiac atelectasis or early infiltrate. Degenerative changes thoracic spine. IMPRESSION: No pulmonary edema. Streaky left base  retrocardiac atelectasis or early infiltrate. Degenerative changes thoracic spine. Electronically Signed   By: Lahoma Crocker M.D.   On: 02/08/2016 19:55   US Abdomen Complete  02/09/2016  CLINICAL DATA:  Abdominal distension for 3 weeks EXAM: ABDOMEN ULTRASOUND COMPLETE COMPARISON:  None. FINDINGS: Gallbladder: No gallstones are noted within gallbladder. There is thickening of gallbladder wall up to 6 mm. Small perihepatic and pericholecystic ascites. No sonographic Murphy's sign. Common bile duct: Diameter: 4.6 mm within normal limits. Liver: No focal lesion identified. Within normal limits in parenchymal echogenicity. IVC: Suboptimal visualization due to bowel gas Pancreas: Suboptimal visualization due to bowel gas Spleen: Size and appearance within normal limits. Measures 7.3 cm in length Right Kidney: Length: 11.2 cm. Echogenicity within normal limits. No mass or hydronephrosis visualized.  Left Kidney: Length: 12.2 cm. Echogenicity within normal limits. No mass or hydronephrosis visualized. Abdominal aorta: No aneurysm visualized. Measures up to 2.3 cm in diameter. Other findings: Abdominal ascites is noted. Suboptimal exam due to abundant bowel gas and patient body habitus. Bilateral pleural effusion. IMPRESSION: 1. No gallstones are noted within gallbladder. There is thickening of gallbladder wall up to 6 mm. No sonographic Murphy's sign. Small perihepatic and pericholecystic ascites. 2. No hydronephrosis. 3. No aortic aneurysm. 4. Abdominal ascites. Bilateral pleural effusion. Suboptimal exam due to abundant bowel gas pre Electronically Signed   By: Lahoma Crocker M.D.   On: 02/09/2016 16:52   Ct Abdomen Pelvis W Contrast  02/10/2016  CLINICAL DATA:  Ascites of uncertain etiology, shortness of breath with exertion, 20 pound weight gain, fatigue, type II diabetes mellitus, essential hypertension, idiopathic cardiomyopathy, CHF, atrial fibrillation EXAM: CT ABDOMEN AND PELVIS WITH CONTRAST TECHNIQUE:  Multidetector CT imaging of the abdomen and pelvis was performed using the standard protocol following bolus administration of intravenous contrast. Sagittal and coronal MPR images reconstructed from axial data set. CONTRAST:  1 ISOVUE-300 IOPAMIDOL (ISOVUE-300) INJECTION 61% IV. Dilute oral contrast. COMPARISON:  None FINDINGS: Lower chest: Small BILATERAL pleural effusions and minimal adjacent bibasilar atelectasis. Hepatobiliary: Liver and gallbladder normal appearance. Pancreas: Normal appearance Spleen: Small splenic clefts.  Otherwise normal appearance Adrenals/Urinary Tract: Mild thickening of adrenal glands without discrete mass/nodule. Symmetric nephrograms without renal mass, hydronephrosis or urinary tract calcification. Unremarkable ureters and bladder. Mild prostatic enlargement. Stomach/Bowel: Normal appendix. Redundant sigmoid colon. Large and small bowel loops otherwise normal appearance. Questionable wall thickening of the distal gastric antrum/pylorus, unable to exclude gastritis, ulcer disease, or mass. Vascular/Lymphatic: Coronary arterial calcifications. Atherosclerotic calcifications at the origins of the renal arteries and SMA. No aneurysm. No adenopathy. Reproductive: N/A Other: Scattered ascites.  No free intraperitoneal air or hernia. Musculoskeletal: Degenerative disc disease changes thoracolumbar spine. No acute osseous findings. IMPRESSION: Ascites of uncertain etiology; liver does not appear cirrhotic. Questionable wall thickening of the distal gastric antrum/ pylorus, cannot exclude gastritis, ulcer disease or mass; endoscopic assessment recommended. Small bibasilar pleural effusions and atelectasis. Coronary artery disease. Mild prostatic enlargement. Electronically Signed   By: Lavonia Dana M.D.   On: 02/10/2016 14:11   US Paracentesis  02/10/2016  INDICATION: ascites EXAM: ULTRASOUND-GUIDED PARACENTESIS COMPARISON:  None. MEDICATIONS: 10 cc 1% lidocaine COMPLICATIONS: None  immediate. TECHNIQUE: Informed written consent was obtained from the patient after a discussion of the risks, benefits and alternatives to treatment. A timeout was performed prior to the initiation of the procedure. Initial ultrasound scanning demonstrates a small amount of ascites within the right upper abdominal quadrant. The right lower abdomen was prepped and draped in the usual sterile fashion. 1% lidocaine with epinephrine was used for local anesthesia. Under direct ultrasound guidance, a 19 gauge, 7-cm, Yueh catheter was introduced. An ultrasound image was saved for documentation purposed. The paracentesis was performed. The catheter was removed and a dressing was applied. The patient tolerated the procedure well without immediate post procedural complication. FINDINGS: A total of approximately 180 cc of dark yellow fluid was removed. Samples were sent to the laboratory as requested by the clinical team. IMPRESSION: Successful ultrasound-guided paracentesis yielding 180 cc of peritoneal fluid. Read by:  Lavonia Drafts [facility] Electronically Signed   By: Aletta Edouard M.D.   On: 02/10/2016 10:19    Microbiology: Recent Results (from the past 240 hour(s))  Gram stain     Status: None   Collection Time: 02/10/16  10:19 AM  Result Value Ref Range Status   Specimen Description PARACENTESIS  Final   Special Requests NONE  Final   Gram Stain   Final    FEW WBC PRESENT,BOTH PMN AND MONONUCLEAR NO ORGANISMS SEEN    Report Status 02/10/2016 FINAL  Final     Labs: Basic Metabolic Panel:  Recent Labs Lab 02/08/16 1827 02/09/16 1155 02/10/16 0410 02/11/16 0542  NA 133* 138 135 137  K 4.9 3.9 3.8 3.7  CL 102 101 99* 99*  CO2 26 29 30 31   GLUCOSE 109* 100* 92 84  BUN 22* 21* 19 15  CREATININE 1.29* 1.14 1.17 0.92  CALCIUM 9.2 9.2 8.8* 9.1   Liver Function Tests:  Recent Labs Lab 02/08/16 1827 02/10/16 0814 02/10/16 0815  AST 28  --   --   ALT 32  --   --   ALKPHOS 81  --   --    BILITOT 1.7*  --   --   PROT 6.7  --  5.7*  ALBUMIN 3.8 3.3*  --    No results for input(s): LIPASE, AMYLASE in the last 168 hours. No results for input(s): AMMONIA in the last 168 hours. CBC:  Recent Labs Lab 02/08/16 1827 02/09/16 1155  WBC 7.2 5.3  HGB 14.0 12.3*  HCT 42.8 37.9*  MCV 89.5 89.6  PLT 192 178   Cardiac Enzymes: No results for input(s): CKTOTAL, CKMB, CKMBINDEX, TROPONINI in the last 168 hours. BNP: BNP (last 3 results)  Recent Labs  02/08/16 1827 02/09/16 1155 02/10/16 1221  BNP 2404.7* 2112.2* 1573.8*    ProBNP (last 3 results) No results for input(s): PROBNP in the last 8760 hours.  CBG:  Recent Labs Lab 02/10/16 0654 02/10/16 1157 02/10/16 1617 02/10/16 2138 02/11/16 0603  GLUCAP 93 105* 144* 164* 92

## 2016-02-11 NOTE — Progress Notes (Signed)
Interpreter Lesle Chris for Discrarge instructions

## 2016-02-11 NOTE — Clinical Documentation Improvement (Signed)
Cardiology Internal Medicine  Can the diagnosis of Atrial Fibrillation be further specified? Please document findings in next progress note. Thank you!   Chronic Atrial fibrillation  Paroxysmal Atrial fibrillation  Permanent Atrial fibrillation  Persistent Atrial fibrillation  Other  Clinically Undetermined  Document any associated diagnoses/conditions.  Supporting Information:  Long term use of anticoagulants  Currently being treated with Xarelto 20 mg PO with supper  Please exercise your independent, professional judgment when responding. A specific answer is not anticipated or expected.  Thank You,  Norm Parcel RN, BSN, [place] Clinical Documentation Specialist [facility] [phone]; cell [phone]

## 2016-02-11 NOTE — Progress Notes (Signed)
Discharge instructions reviewed with patient and family with intrepeter present, patient to follow up with MD, prescription for lasix given, iv removed and questions and concerns answered Neta Mends RN 1:11 PM 02-11-2016

## 2016-02-14 ENCOUNTER — Encounter: Payer: Self-pay | Admitting: Family Medicine

## 2016-02-14 ENCOUNTER — Ambulatory Visit (INDEPENDENT_AMBULATORY_CARE_PROVIDER_SITE_OTHER): Payer: Self-pay | Admitting: Family Medicine

## 2016-02-14 VITALS — BP 120/70 | HR 63 | Temp 97.7°F | Resp 14 | Ht 71.0 in | Wt 228.0 lb

## 2016-02-14 DIAGNOSIS — I482 Chronic atrial fibrillation, unspecified: Secondary | ICD-10-CM

## 2016-02-14 DIAGNOSIS — E119 Type 2 diabetes mellitus without complications: Secondary | ICD-10-CM

## 2016-02-14 DIAGNOSIS — I5022 Chronic systolic (congestive) heart failure: Secondary | ICD-10-CM

## 2016-02-14 LAB — GLUCOSE, POCT (MANUAL RESULT ENTRY): POC GLUCOSE: 118 mg/dL — AB (ref 70–99)

## 2016-02-14 MED ORDER — FUROSEMIDE 40 MG PO TABS: 40.0000 mg | ORAL_TABLET | Freq: Every day | ORAL | Status: DC

## 2016-02-14 NOTE — Patient Instructions (Signed)
Okay to go back to work  Continue your carvedilol and lisinopril (these medicines will help the heart, and allow it to improve that 20% up a little) Continue furosemide 40 mg daily (this will help your body keep fluid from accumulating/gaining weight)  Weigh yourself every day.  If your weight increases more than 235 lbs, you should call our office or Dr. Elmarie Shiley office right away.  It may be necessary to take extra furosemide.  Return to see [place] in 6 months.

## 2016-02-14 NOTE — Progress Notes (Signed)
CC: Hospital follow up  HPI:  This is a 70 year old man with PMHx significant for chronic systolic CHF (EF 0000000) probably NICM, permanent AF on Xarelto, and NIDDM who presents for hospital follow up.  Pt accompanied today by daughter-in-law who is english speaking, all translation is through certified in person interpreter.  Over the last 6 weeks, the patient and his family had noted increased fatigue and dyspnea with exertion as well as 20 lbs weight gain.  He was seen by his PCP who was concerned (given his Xarelto) for anemia and recommended urgent hospital evaluation.    In the ER: -Hemodynamically stable -Hgb normal -Weight up 20 lbs from baseline, but no hypoxia, edema on CXR or impressive fluid overload (JVP elevation or LE edema) other than in the abdomen  -He endorsed intolerance to recently started spironolactone and that he was not taking it -He was diagnosed with decompensated CHF, and started on IV furosemide   While in the hospital: -Brisk diuresis of 3L in first 12 hours in the hospital -Renal function remained stable and patient diuresed 20 lbs over 3 days -Given concern for hepatic ascites and GIB on Xarelto, he was seen by GI who performed paracentesis, RUQ [place] and CT abdomen.   -Imaging showed normal liver architecture -Paracentesis showed few PMNs and SAG 1.1, consistent with cardiac ascites -GI recommended follow up in 8 weeks to discuss colonoscopy or not for possible occult GI blood loss -His breathing and exercise capacity improved markedly with diuresis -Discharged with furosemide incresed from 20 to 40 mg daily, daily weights and still off spironolactone  Since discharge, he is feeling well.  He is able to walk down the street and back without problems, would like to go back to work.  He is taking his carvedilol, lisinopril, rivaroxaban, and furosemide daily without problems.      ROS: No abdominal swelling, chest pain, leg swelling, orthopnea, PND.       PMH:  1. CHF     Suspected NICM given normal stress in 2016     EF 20-25% during hospitalization June 2017 2. Afib     On xarelto 3. HTN 4. NIDDM 5. CKD III 6. Minimal normocytic anemia, ferritin low normal, B12 normal, folate normal   Medications: Medication Sig Start Date End Date Taking? Authorizing Provider  carvedilol (COREG) 12.5 MG tablet Take 1 tab by mouth in the morning and 2 tabs by mouth in the evening. Patient taking differently: Take 12.5 mg by mouth 2 (two) times daily with a meal.  08/06/15  Yes Mack Hook, MD  furosemide (LASIX) 40 MG tablet Take 1 tablet (40 mg total) by mouth daily. 02/14/16  Yes Edwin Dada, MD  lisinopril (PRINIVIL,ZESTRIL) 40 MG tablet Take 1 tablet (40 mg total) by mouth daily. 10/04/15  Yes Mack Hook, MD  metFORMIN (GLUCOPHAGE) 500 MG tablet Take 1 tablet (500 mg total) by mouth 2 (two) times daily with a meal. 01/17/16  Yes Mack Hook, MD  potassium chloride (K-DUR) 10 MEQ tablet Take 1 tablet (10 mEq total) by mouth daily. 12/05/15  Yes Thayer Headings, MD  rivaroxaban (XARELTO) 20 MG TABS tablet Take 1 tablet (20 mg total) by mouth daily with supper. 12/05/15  Yes Thayer Headings, MD  acetaminophen (TYLENOL) 650 MG CR tablet Take 650 mg by mouth every 8 (eight) hours as needed for pain. Reported on 02/14/2016    Historical Provider, MD     SH: Non-smoker.  Former alcohol use.  Lives  with family.  Works in Ambulance person.     OBJECTIVE: BP 120/70 mmHg  Pulse 63  Temp(Src) 97.7 F (36.5 C)  Resp 14  Ht 5\' 11"  (1.803 m)  Wt 228 lb (103.42 kg)  BMI 31.81 kg/m2  SpO2 99% BP 120/70 mmHg  Pulse 63  Temp(Src) 97.7 F (36.5 C)  Resp 14  Ht 5\' 11"  (1.803 m)  Wt 228 lb (103.42 kg)  BMI 31.81 kg/m2  SpO2 99%  General: Healthy, alert and in no distress.  Responds appropriately to questions.  Eye contact, dress and hygiene appropriate. Cardiac: Irregularly irregular, nl S1-S2, no murmurs, rubs, gallops that  I appreciate.  Capillary refill is less than 2 seconds.  No LE edema. Respiratory: Normal respiratory rate and rhythm.  CTAB without rales or wheezes. Abdomen: The abdomen is soft and without ascites, marked improvement from admission. Neuro: Sensorium intact.  Speech is fluent.  Muscle tone normal, without fasciculations.  Moves all extremities equally and with normal coordination.  Attention span and concentration are within normal limits.       A&P: 1. Chronic systolic CHF: EF down to [id number] at recent hospitalization.  Presumed NICM, per Dr. Elmarie Shiley notes.  Has follow up with Dr. Elmarie Shiley office scheduled in July (in three weeks). -Continue carvedilol and lisinopril -Continue furosemide -Spironolactone indicated, but patient intolerant -ICD evaluated before, felt not indicated by EP per notes -RTC 6 months for routine CHF care -Check daily weights and call for weight >235 lbs    2. Atrial fibrillation: Permanent.  On Xarelto.  CHADS2-Vasc 4.   -Continue Xarelto -Continue carvedilol  3. Diabetes: -Continue metformin         Suann Larry Tadarrius Burch 02/14/2016, 3:47 PM

## 2016-02-15 LAB — CULTURE, BODY FLUID-BOTTLE: CULTURE: NO GROWTH

## 2016-02-18 ENCOUNTER — Ambulatory Visit: Payer: No Typology Code available for payment source | Admitting: Internal Medicine

## 2016-03-05 ENCOUNTER — Telehealth: Payer: Self-pay | Admitting: Internal Medicine

## 2016-03-05 ENCOUNTER — Ambulatory Visit (INDEPENDENT_AMBULATORY_CARE_PROVIDER_SITE_OTHER): Payer: No Typology Code available for payment source | Admitting: Cardiovascular Disease

## 2016-03-05 ENCOUNTER — Encounter: Payer: Self-pay | Admitting: Cardiovascular Disease

## 2016-03-05 VITALS — BP 106/66 | HR 78 | Ht 71.0 in | Wt 224.0 lb

## 2016-03-05 DIAGNOSIS — I482 Chronic atrial fibrillation, unspecified: Secondary | ICD-10-CM

## 2016-03-05 DIAGNOSIS — I5022 Chronic systolic (congestive) heart failure: Secondary | ICD-10-CM

## 2016-03-05 DIAGNOSIS — I10 Essential (primary) hypertension: Secondary | ICD-10-CM

## 2016-03-05 NOTE — Patient Instructions (Addendum)
Medication Instructions:  Your physician recommends that you continue on your current medications as directed. Please refer to the Current Medication list given to you today.   Labwork: Your physician recommends that you return for lab work in: 3 months at next office visit for basic metabolic panel   Testing/Procedures: None Ordered   Follow-Up: Your physician recommends that you follow-up in: 3 months with Dr. Acie Fredrickson on Thursday Oct. 19 at 8:00 am   If you need a refill on your cardiac medications before your next appointment, please call your pharmacy.   Thank you for choosing [facility]! Christen Bame, RN [phone]

## 2016-03-05 NOTE — Telephone Encounter (Signed)
Patient called requesting medication refill for furosemide (LASIX) 40 MG tablet [id number] . Please send to [facility] and notify patient.

## 2016-03-05 NOTE — Progress Notes (Signed)
Cardiology Office Note   Date:  03/05/2016   ID:  Kyle Burke [birth year], MRN [id number]  PCP:  Mack Hook, MD  Cardiologist:   Mertie Moores, MD   Chief Complaint  Patient presents with  . Follow-up   Problem List 1. Atrial fibrillation 2. CHF  - EF  25% 3. LBBB 4. Diabetes Mellitus    History of Present Illness: Kyle Burke is a 70 y.o. male orginially from [place], who presents for evaluation of newly diagnosed atrial fib.   The patient speaks some english.  No intrepreter was available.  Was an urgent work in from [employer] - Dr. Amil Amen. He presented to Dr. Mahala Menghini with palpitations in his left chest.  Was found to have a-fib and was referred to [place].  No CP , no dyspnea.  Works in Loss adjuster, [employer].  Non smoker Drinks beer occasionally   Feb. 3, 2015:  Kyle Burke is feeling better.  Is seen with intrepreter , [name].   We performed an echocardiogram which reveals severe left ventricle systolic dysfunction. His ejection fraction is 25-30%. He was referred to [place] several weeks ago for evaluatino of A-fib with RVR We started him on Xarelto, metooprolol,  He had some slight dizziness when he first started the metoprolol but this is largely resolved. He remains on diltiazem.  He is still at work . Breathing is much better.   October 19, 2014:  Pt is seen today with intrepreter - [name].  He did not tolerate the metoprolol - stopped it on his own.  Dr. Tawni Carnes restarted Diltiazem 240  December 15, 2014; Pt was seen with intrepreter, [name].   He is doing well.  Working 4 times a week.  Also works in his garden on his off days.  Has some pain in his legs. Not cramping   June 29 , 2016:  Seen with Intrepreter, [name].  Breathing is ok.  Generally feeling stronger since we have started the CHF meds. Has occasional palpitations at night  Still able to work 4 times a week.   Jan. 10, 2017: [name] is doing  well .  Seen with [name] ( Interpreter )  No CP or dyspnea.  Occasional palpitations,  Only feels it occasionally .  Tolerating his meds well Takes his meds every day   December 05, 2015 Seen with Cresenciano Gen[facility] )  Staying active.  Working , no CP or dyspnea.   March 05, 2016:    Kyle Burke is seen today .  Intrepretation by the [employer] work station   Was in the hospital for several days  Lasix was incresed Did not tolerate the aldactone - caused fatigue   Is feeling much better  Past Medical History  Diagnosis Date  . Diabetes mellitus 2001  . Hypertension 2006  . Idiopathic cardiomyopathy ([place]) 11/01/2014    Followed by Dr. Acie Fredrickson, Cardiology  EF 25%  Adenosine Cardiolite did not support ischemic etiology  . Atrial fibrillation ([place]) 2016    Past Surgical History  Procedure Laterality Date  . Incise and drain abcess  1986    on leg  . Inguinal hernia repair  01/16/2012    Procedure: LAPAROSCOPIC INGUINAL HERNIA;  Surgeon: Gayland Curry, MD,FACS;  Location: [facility];  Service: General;  Laterality: Left;  Laparoscopic incarcerated Left inguinal hernia repair with mesh, umbilical hernia repair  . Umbilical hernia repair  01/16/2012    Procedure: HERNIA REPAIR UMBILICAL ADULT;  Surgeon: Gayland Curry,  MD,FACS;  Location: [facility];  Service: General;  Laterality: N/A;     Current Outpatient Prescriptions  Medication Sig Dispense Refill  . acetaminophen (TYLENOL) 650 MG CR tablet Take 650 mg by mouth every 8 (eight) hours as needed for pain. Reported on 02/14/2016    . carvedilol (COREG) 12.5 MG tablet Take 1 tab by mouth in the morning and 2 tabs by mouth in the evening. (Patient taking differently: Take 12.5 mg by mouth 2 (two) times daily with a meal. ) 90 tablet 11  . furosemide (LASIX) 40 MG tablet Take 1 tablet (40 mg total) by mouth daily. 90 tablet 3  . lisinopril (PRINIVIL,ZESTRIL) 40 MG tablet Take 1 tablet (40 mg total) by mouth daily. 30 tablet 11  .  metFORMIN (GLUCOPHAGE) 500 MG tablet Take 1 tablet (500 mg total) by mouth 2 (two) times daily with a meal. 60 tablet 11  . potassium chloride (K-DUR) 10 MEQ tablet Take 1 tablet (10 mEq total) by mouth daily. 30 tablet 11  . rivaroxaban (XARELTO) 20 MG TABS tablet Take 1 tablet (20 mg total) by mouth daily with supper. 30 tablet 11   No current facility-administered medications for this visit.    Allergies:   Review of patient's allergies indicates no known allergies.    Social History:  The patient  reports that he has never smoked. He has never used smokeless tobacco. He reports that he does not drink alcohol or use illicit drugs.   Family History:  The patient's family history includes Cancer (age of onset: 38) in his sister; Diabetes in his brother and father; Heart disease in his father and mother; Kidney disease in his father; Stroke in his mother.    ROS:  Please see the history of present illness.   Otherwise, review of systems are positive for none.   All other systems are reviewed and negative.    PHYSICAL EXAM: VS:  BP 106/66 mmHg  Pulse 78  Ht 5\' 11"  (1.803 m)  Wt 224 lb (101.606 kg)  BMI 31.26 kg/m2 , BMI Body mass index is 31.26 kg/(m^2). GEN: Well nourished, well developed, in no acute distress HEENT: normal Neck: no JVD, carotid bruits, or masses Cardiac: Irreg. Irreg. ; soft systolic  Murmur. No , rubs, or gallops,no edema  Respiratory:  clear to auscultation bilaterally, normal work of breathing GI: soft, nontender, nondistended, + BS MS: no deformity or atrophy Skin: warm and dry, no rash Neuro:  Strength and sensation are intact Psych: euthymic mood, full affect   EKG:  EKG is not ordered today.    Recent Labs: 02/08/2016: ALT 32 02/09/2016: Hemoglobin 12.3*; Platelets 178; TSH 3.751 02/10/2016: B Natriuretic Peptide 1573.8* 02/11/2016: BUN 15; Creatinine, Ser 0.92; Potassium 3.7; Sodium 137    Lipid Panel    Component Value Date/Time   CHOL 151  06/26/2015 1104   CHOL 155 09/15/2014 1037   TRIG 43 06/26/2015 1104   HDL 55 06/26/2015 1104   HDL 39* 09/15/2014 1037   CHOLHDL 4.0 09/15/2014 1037   VLDL 12 09/15/2014 1037   LDLCALC 87 06/26/2015 1104   LDLCALC 104* 09/15/2014 1037      Wt Readings from Last 3 Encounters:  03/05/16 224 lb (101.606 kg)  02/14/16 228 lb (103.42 kg)  02/11/16 223 lb 4.8 oz (101.288 kg)      Other studies Reviewed: Additional studies/ records that were reviewed today include: records from Dr. Tawni Carnes and Lovena Le  Review of the above records demonstrates: atrial fib  ASSESSMENT AND PLAN:  1.  Atrial fib with RVR.   His atrial fib rate is well controlled.  Continue current meds.  Continue xarelto - he gets at [facility]  2. Chronic systolic congestive heart failure:   Erron is doing much better He was hospitalized last month for  CHF exacerbation. His Lasix was increased and he is doing quite a bit better. He does not report any extra salt intake. He seems to be tolerating the Lasix well. He did not tolerate the Aldactone. We'll continue with his current medications   Will see him in 3 months  .  Current medicines are reviewed at length with the patient today.  The patient does not have concerns regarding medicines. .    Labs/ tests ordered today include:   No orders of the defined types were placed in this encounter.      Signed, Mertie Moores, MD  03/05/2016 9:16 AM    [facility] [place], [place], [place]  [address] Phone: [phone]; Fax: [phone]

## 2016-03-06 MED ORDER — FUROSEMIDE 40 MG PO TABS: 40.0000 mg | ORAL_TABLET | Freq: Every day | ORAL | Status: DC

## 2016-03-06 NOTE — Telephone Encounter (Signed)
Rx sent 

## 2016-03-28 ENCOUNTER — Encounter: Payer: Self-pay | Admitting: *Deleted

## 2016-03-31 ENCOUNTER — Encounter: Payer: Self-pay | Admitting: Internal Medicine

## 2016-03-31 ENCOUNTER — Ambulatory Visit (INDEPENDENT_AMBULATORY_CARE_PROVIDER_SITE_OTHER): Payer: Self-pay | Admitting: Internal Medicine

## 2016-03-31 VITALS — BP 110/60 | HR 68 | Resp 16 | Ht 71.0 in | Wt 228.0 lb

## 2016-03-31 DIAGNOSIS — R600 Localized edema: Secondary | ICD-10-CM

## 2016-03-31 DIAGNOSIS — E119 Type 2 diabetes mellitus without complications: Secondary | ICD-10-CM

## 2016-03-31 LAB — GLUCOSE, POCT (MANUAL RESULT ENTRY): POC Glucose: 102 mg/dl — AB (ref 70–99)

## 2016-03-31 MED ORDER — FUROSEMIDE 40 MG PO TABS
40.0000 mg | ORAL_TABLET | Freq: Every day | ORAL | 11 refills | Status: DC
Start: 2016-03-31 — End: 2016-05-08

## 2016-03-31 NOTE — Progress Notes (Signed)
   Subjective:    Patient ID: Kyle Burke, male    DOB: 1945-11-27, 70 y.o.   MRN: [id number]  HPI   Patient here with increasing swelling of ankles.  Was hospitalized end of June with abdominal swelling/ascites after he requested coming off spironolactone as he felt it made him feel poorly/fatigued Was diuresed/underwent paracentesis and started on increased dose of Furosemide with good resulte (20 lbs water weight removed)  Was feeling improved with increased energy, back to work until developed swelling Friday, 4 days earlier.   Does state he was not eating food from home preceding the swelling and had more restaurant food where he did not know the salt content.  Usually, he requests without salt, but forgot to do so this time around. No dyspnea, chest pain.  No abdominal bloating or swelling. Took an extra dose of Furosemide 4 nights ago and it helped.  Was afraid to keep trying. Does not want to retry Spironolactone.  Current Meds  Medication Sig  . acetaminophen (TYLENOL) 650 MG CR tablet Take 650 mg by mouth every 8 (eight) hours as needed for pain. Reported on 02/14/2016  . carvedilol (COREG) 12.5 MG tablet Take 1 tab by mouth in the morning and 2 tabs by mouth in the evening. (Patient taking differently: Take 12.5 mg by mouth 2 (two) times daily with a meal. )  . furosemide (LASIX) 40 MG tablet Take 1 tablet (40 mg total) by mouth daily.  Marland Kitchen lisinopril (PRINIVIL,ZESTRIL) 40 MG tablet Take 1 tablet (40 mg total) by mouth daily.  . metFORMIN (GLUCOPHAGE) 500 MG tablet Take 1 tablet (500 mg total) by mouth 2 (two) times daily with a meal.  . potassium chloride (K-DUR) 10 MEQ tablet Take 1 tablet (10 mEq total) by mouth daily.  . rivaroxaban (XARELTO) 20 MG TABS tablet Take 1 tablet (20 mg total) by mouth daily with supper.  . [DISCONTINUED] furosemide (LASIX) 40 MG tablet Take 1 tablet (40 mg total) by mouth daily.   No Known Allergies    Review of Systems     Objective:   Physical Exam   NAD Neck: No JVD Lungs:  CTA, bases resonant to percussion CV:  Irregularly irregular, no murmur or rub, radial and DP pulses normal and equal. Abd:  S, NT, No HSM or mass, no obvious fluid today. LE:  Mild edema to ankles, slight pitting.         Assessment & Plan:  Mild LE edema:  Encouraged patient to call before dropping in for a visit. Ti increase Furosemide next 3 days to 60 mg, eat a banana or orange daily and call in progress on Friday.   If any problems, to call sooner. Discussed avoiding sodium to prevent edema as well. Encouraged rethinking the spironolactone, but he is not willing to consider.

## 2016-03-31 NOTE — Patient Instructions (Addendum)
Tome 1 1/2 pastillas Furosemide cada dia en la manana para 3 dias. Habla [facility] con reporte de progresso

## 2016-04-15 ENCOUNTER — Ambulatory Visit: Payer: No Typology Code available for payment source | Admitting: Internal Medicine

## 2016-04-22 ENCOUNTER — Telehealth: Payer: Self-pay | Admitting: *Deleted

## 2016-04-22 ENCOUNTER — Encounter: Payer: Self-pay | Admitting: Physician Assistant

## 2016-04-22 ENCOUNTER — Ambulatory Visit (INDEPENDENT_AMBULATORY_CARE_PROVIDER_SITE_OTHER): Payer: No Typology Code available for payment source | Admitting: Physician Assistant

## 2016-04-22 ENCOUNTER — Other Ambulatory Visit (INDEPENDENT_AMBULATORY_CARE_PROVIDER_SITE_OTHER): Payer: No Typology Code available for payment source

## 2016-04-22 VITALS — BP 112/76 | HR 80 | Ht 74.0 in | Wt 235.0 lb

## 2016-04-22 DIAGNOSIS — R195 Other fecal abnormalities: Secondary | ICD-10-CM

## 2016-04-22 LAB — BASIC METABOLIC PANEL
BUN: 27 mg/dL — ABNORMAL HIGH (ref 6–23)
CALCIUM: 9.1 mg/dL (ref 8.4–10.5)
CHLORIDE: 102 meq/L (ref 96–112)
CO2: 29 meq/L (ref 19–32)
Creatinine, Ser: 1.08 mg/dL (ref 0.40–1.50)
GFR: 72.06 mL/min (ref >60.00)
GLUCOSE: 102 mg/dL — AB (ref 70–99)
POTASSIUM: 4.6 meq/L (ref 3.5–5.1)
SODIUM: 137 meq/L (ref 135–145)

## 2016-04-22 LAB — CBC WITH DIFFERENTIAL/PLATELET
BASOS ABS: 0 10*3/uL (ref 0.0–0.1)
Basophils Relative: 0.5 % (ref 0.0–3.0)
EOS ABS: 0.1 10*3/uL (ref 0.0–0.7)
Eosinophils Relative: 1.7 % (ref 0.0–5.0)
HCT: 37.5 % — ABNORMAL LOW (ref 39.0–52.0)
HEMOGLOBIN: 12.3 g/dL — AB (ref 13.0–17.0)
LYMPHS ABS: 1.3 10*3/uL (ref 0.7–4.0)
Lymphocytes Relative: 22.5 % (ref 12.0–46.0)
MCHC: 32.8 g/dL (ref 30.0–36.0)
MCV: 89.1 fl (ref 78.0–100.0)
MONO ABS: 0.7 10*3/uL (ref 0.1–1.0)
Monocytes Relative: 11.9 % (ref 3.0–12.0)
NEUTROS PCT: 63.4 % (ref 43.0–77.0)
Neutro Abs: 3.6 10*3/uL (ref 1.4–7.7)
Platelets: 174 10*3/uL (ref 150.0–400.0)
RBC: 4.21 Mil/uL — ABNORMAL LOW (ref 4.22–5.81)
RDW: 15.4 % (ref 11.5–15.5)
WBC: 5.7 10*3/uL (ref 4.0–10.5)

## 2016-04-22 NOTE — Progress Notes (Addendum)
Subjective:    Patient ID: Kyle Burke, male    DOB: 05-20-1946, 70 y.o.   MRN: [id number]  HPI Kyle Burke is a pleasant 70 year old Hispanic male, in the [place] since 1995, non-English-speaking and discussion was had via an interpreter. Patient known to Dr. Hilarie Fredrickson from recent hospital consultation June 2017 at which time patient was hospitalized with congestive heart failure and anasarca. He did have ascites and a documented EF of around 20-25%. He was diuresed during his hospitalization and also had a diagnostic paracentesis done. Peritoneal fluid albumin was 2.2, he had a high gradient SAAG felt consistent with portal hypertension or heart failure. Platelet count was normal and liver unremarkable by ultrasound and CT.Marland Kitchen He had dramatic improvement in his symptoms during hospitalization. He was also noted to be Hemoccult positive in the setting of Xarelto. It was felt that he should have colonoscopy which was not appropriate during his admission for congestive heart failure. He returns today for post hospital follow-up. In seen by cardiology in follow-up as yet. He says he has been feeling well and has not had any problems with shortness of breath or fluid reaccumulation though he does still have a small amount of fluid in his legs. He has no complaints of abdominal pain, appetite has been good. He has not had any prior GI evaluation with endoscopy or colonoscopy. Labs done 02/13/2016 hemoglobin was 12.3 hematocrit is 37.9 iron studies were unremarkable. He was documented to be Hemoccult-positive just prior to hospitalization.  CT of the abdomen and pelvis done 02/10/2016 and normal was question of wall thickening of the antrum of the stomach and ascites was noted. Other medical problems include atrial fibrillation diabetes mellitus and left bundle branch block.  Review of Systems Pertinent positive and negative review of systems were noted in the above HPI section.  All other review of systems was  otherwise negative.  Outpatient Encounter Prescriptions as of 04/22/2016  Medication Sig  . acetaminophen (TYLENOL) 650 MG CR tablet Take 650 mg by mouth every 8 (eight) hours as needed for pain. Reported on 02/14/2016  . carvedilol (COREG) 12.5 MG tablet Take 1 tab by mouth in the morning and 2 tabs by mouth in the evening. (Patient taking differently: Take 12.5 mg by mouth 2 (two) times daily with a meal. )  . furosemide (LASIX) 40 MG tablet Take 1 tablet (40 mg total) by mouth daily.  Marland Kitchen lisinopril (PRINIVIL,ZESTRIL) 40 MG tablet Take 1 tablet (40 mg total) by mouth daily.  . metFORMIN (GLUCOPHAGE) 500 MG tablet Take 1 tablet (500 mg total) by mouth 2 (two) times daily with a meal.  . potassium chloride (K-DUR) 10 MEQ tablet Take 1 tablet (10 mEq total) by mouth daily.  . rivaroxaban (XARELTO) 20 MG TABS tablet Take 1 tablet (20 mg total) by mouth daily with supper.   No facility-administered encounter medications on file as of 04/22/2016.    No Known Allergies Patient Active Problem List   Diagnosis Date Noted  . Essential hypertension 06/15/2015  . Normocytic anemia 04/07/2015  . Controlled type 2 diabetes mellitus with stage 3 chronic kidney disease, without long-term current use of insulin ([place])   . Dyslipidemia   . Microalbuminuria due to type 2 diabetes mellitus ([place])   . Chronic systolic CHF (congestive heart failure) ([place]) 12/15/2014  . Atrial fibrillation ([place]) 09/06/2014  . HTN (hypertension) 09/06/2014  . LBBB (left bundle branch block) 09/06/2014   Social History   Social History  . Marital status: Married  Spouse name: N/A  . Number of children: 5  . Years of education: 9   Occupational History  . Concrete constrution     [employer]   Social History Main Topics  . Smoking status: Never Smoker  . Smokeless tobacco: Never Used  . Alcohol use No     Comment: rare occ., formerly more  . Drug use: No  . Sexual activity: Yes     Comment: But not regular    Other Topics Concern  . Not on file   Social History Narrative   Originally from [place]   Came to [place] In 1995   Lives at home with wife, a son and his wife and 3 grandchildren.    Kyle Burke's family history includes Cancer (age of onset: 51) in his sister; Diabetes in his brother and father; Heart disease in his father and mother; Kidney disease in his father; Stroke in his mother.      Objective:    Vitals:   04/22/16 1121  BP: 112/76  Pulse: 80    Physical Exam  well-developed older Hispanic male in no acute distress, pleasant, non-English-speaking, telephone interpreter was used. The blood pressure 112/76, pulse 80, BMI 30.1 HEENT; nontraumatic normocephalic EOMI PERRLA sclera anicteric, Cardiovascular ;regular rate and rhythm with S1-S2 does have a systolic murmur, Pulmonary ;clear bilaterally, Abdomen; soft nondistended no appreciable ascites nontender bowel sounds present no palpable mass or hepatosplenomegaly, Rectal ;exam not done, Extremities ;1+ edema in the ankles, Neuropsych ;mood and affect appropriate       Assessment & Plan:   #76 70 year old Hispanic male non-English-speaking with Hemoccult-positive stool in setting of Xarelto. Patient has not had any prior endoscopic evaluation. Rule out occult colon lesion/neoplasm or chronic gastropathy #2 congestive heart failure-EF 25% #3 adult-onset diabetes mellitus #4 left bundle branch block #5 atrial fibrillation #6 chronic anticoagulation with Xarelto #7 recent admission with congestive heart failure/anasarca and ascites. Ascites determined to be  Cardiogenic  Plan; patient will be scheduled for colonoscopy and EGD with Dr. Hilarie Fredrickson. Both procedures discussed in detail with the patient via interpreter and he is agreeable to proceed. Procedures will be scheduled at [facility] given his cardiomyopathy We will communicate with his cardiologist Dr. Cathie Olden  to assure that the holding Xarelto for  24-48  hours prior to his procedure is reasonable for this patient.  Robey Massmann S Shauntee Karp PA-C 04/22/2016   Cc: Mack Hook, MD  Addendum: Reviewed and agree with initial management. Jerene Bears, MD

## 2016-04-22 NOTE — Telephone Encounter (Signed)
  04/22/2016   RE: Frederik Hartsock DOB: 09/08/1945 MRN: [id number]   Dear Dr. Mertie Moores,    We have scheduled the above patient for an endoscopic procedure. Our records show that he is on anticoagulation therapy.   Please advise as to how long the patient may come off his therapy of Xarelto prior to the procedure, which is scheduled for 05-14-2016.  Please fax back/ or route the completed form to [facility] [phone]   Sincerely,    Amy Esterwood PA-C [facility]

## 2016-04-22 NOTE — Telephone Encounter (Signed)
Pt may hold Xarelto for 2 days prior to procedure . He is at low risk for the procedure

## 2016-04-22 NOTE — Patient Instructions (Addendum)
Please go to the basement level to have your labs drawn.  You have been scheduled for an endoscopy and colonoscopy. Please follow the written instructions given to you at your visit today. Please pick up your prep supplies at the pharmacy within the next 1-3 days.  If you use inhalers (even only as needed), please bring them with you on the day of your procedure. Your physician has requested that you go to www.startemmi.com and enter the access code given to you at your visit today. This web site gives a general overview about your procedure. However, you should still follow specific instructions given to you by our office regarding your preparation for the procedure. 

## 2016-04-23 NOTE — Telephone Encounter (Signed)
Elmer Ramp called the patient for me due to the patient is spanish speaking.  She left him a message. We wanted to advise him to hold the Xarelto 9-25,26, and 27th,. He can resume it on 9-28.

## 2016-05-07 ENCOUNTER — Encounter (HOSPITAL_COMMUNITY): Payer: Self-pay | Admitting: *Deleted

## 2016-05-08 ENCOUNTER — Telehealth: Payer: Self-pay | Admitting: *Deleted

## 2016-05-08 ENCOUNTER — Encounter: Payer: Self-pay | Admitting: Internal Medicine

## 2016-05-08 ENCOUNTER — Ambulatory Visit (INDEPENDENT_AMBULATORY_CARE_PROVIDER_SITE_OTHER): Payer: No Typology Code available for payment source | Admitting: Internal Medicine

## 2016-05-08 VITALS — BP 118/80 | HR 63 | Resp 16 | Ht 71.0 in | Wt 234.0 lb

## 2016-05-08 DIAGNOSIS — I482 Chronic atrial fibrillation, unspecified: Secondary | ICD-10-CM

## 2016-05-08 DIAGNOSIS — Z23 Encounter for immunization: Secondary | ICD-10-CM

## 2016-05-08 DIAGNOSIS — E119 Type 2 diabetes mellitus without complications: Secondary | ICD-10-CM

## 2016-05-08 DIAGNOSIS — I10 Essential (primary) hypertension: Secondary | ICD-10-CM

## 2016-05-08 DIAGNOSIS — I5022 Chronic systolic (congestive) heart failure: Secondary | ICD-10-CM

## 2016-05-08 LAB — GLUCOSE, POCT (MANUAL RESULT ENTRY): POC Glucose: 149 mg/dl — AB (ref 70–99)

## 2016-05-08 MED ORDER — FUROSEMIDE 40 MG PO TABS: ORAL_TABLET | ORAL | 11 refills | Status: DC

## 2016-05-08 MED ORDER — CARVEDILOL 12.5 MG PO TABS: ORAL_TABLET | ORAL | 11 refills | Status: DC

## 2016-05-08 NOTE — Patient Instructions (Signed)
Kyle Burke, [facility]. Lia Hopping

## 2016-05-08 NOTE — Telephone Encounter (Signed)
Walk-in ,Pt was seen by the GI doctor in [facility] this AM. According to pt he is scheduled for a colonoscopy on 05/14/16. Pt was asked to call his cardiology's office to find our how long he needs to stop taking Xarelto medication prior the procedure. Pt decided to came in the office, because it would be hard for him to communicate by phone because he only speaks  [place].  Dr. Acie Fredrickson aware of pt's request, and recommends for pt to hold Xarelto 20 mg 2 days prior the procedure. Pt is aware he verbalized understanding.

## 2016-05-08 NOTE — Telephone Encounter (Signed)
Agree Hold Xarelto for 2 days prior to procedure

## 2016-05-08 NOTE — Progress Notes (Signed)
   Subjective:    Patient ID: Kyle Burke, male    DOB: 19-Feb-1946, 70 y.o.   MRN: [id number]  HPI   1. Dilated Cardiomyopathy:  Feels well. No dyspnea.  No chest pain.  Mild swelling of bilateral ankles.  He continues to take 60 mg of Furosemide daily and is still having mild ankle swelling.  Does not feel his abdomen is holding fluid.  Needs more tabs of Furosemide monthly so he doesn't have to get filled so frequently.  Not enjoying his food without salt.  Discussed using garlic, thyme, rosemary, sage and other seasonings he is not familiar with--to try new ones.    2.  DM Type 2:  94-104 fasting in the morning.  A1C at his usual level around 6.3 end of March.  Checking feet nightly without noted problems. Has not had an eye exam this year.  Cannot recall who he went to last time.  Thinks they were on [address].  3.  Essential Hypertension:  Controlled.   Current Meds  Medication Sig  . acetaminophen (TYLENOL) 650 MG CR tablet Take 650 mg by mouth every 8 (eight) hours as needed for pain. Reported on 02/14/2016  . carvedilol (COREG) 12.5 MG tablet 1 tab by mouth twice daily  . lisinopril (PRINIVIL,ZESTRIL) 40 MG tablet Take 1 tablet (40 mg total) by mouth daily.  . metFORMIN (GLUCOPHAGE) 500 MG tablet Take 1 tablet (500 mg total) by mouth 2 (two) times daily with a meal.  . potassium chloride (K-DUR) 10 MEQ tablet Take 1 tablet (10 mEq total) by mouth daily.  . rivaroxaban (XARELTO) 20 MG TABS tablet Take 1 tablet (20 mg total) by mouth daily with supper.  . [DISCONTINUED] carvedilol (COREG) 12.5 MG tablet Take 1 tab by mouth in the morning and 2 tabs by mouth in the evening. (Patient taking differently: Take 12.5 mg by mouth 2 (two) times daily with a meal. )  . [DISCONTINUED] furosemide (LASIX) 40 MG tablet Take 1 tablet (40 mg total) by mouth daily.  . [DISCONTINUED] furosemide (LASIX) 40 MG tablet 1 1/2 tabs by mouth in the morning daily   No Known Allergies  Review of  Systems     Objective:   Physical Exam HEENT:  PERRL, EOMI, TMs pearly gray, throat without injection. Neck:  No JVD, supple, No adenopathy Chest:  CTA CV:  Irregularly irregular, without murmur or rub, Radial and DP pulses normal and equal.  Mild pitting edema of ankles.       Assessment & Plan:  1.  Dilated Cardiomyopathy:  Seems to be doing well.  Hold for now on increasing Furosemide.  Heart rate controlled  2.  DM:  Well controlled. Will wait for repeat eye exam for when slot comes open through [facility].  3.  Essential Hypertension:  Controlled  4.  Health Maintenance:  Tdap today.  Encouraged flu vaccine gratis at [facility] on [facility]

## 2016-05-14 ENCOUNTER — Ambulatory Visit (HOSPITAL_COMMUNITY)
Admission: RE | Admit: 2016-05-14 | Discharge: 2016-05-14 | Disposition: A | Discharge status: Home / Self Care | Payer: No Typology Code available for payment source | Source: Ambulatory Visit | Attending: Internal Medicine | Admitting: Internal Medicine

## 2016-05-14 ENCOUNTER — Ambulatory Visit (HOSPITAL_COMMUNITY): Payer: Self-pay | Admitting: Anesthesiology

## 2016-05-14 ENCOUNTER — Encounter (HOSPITAL_COMMUNITY)
Admission: RE | Disposition: A | Discharge status: Home / Self Care | Payer: Self-pay | Source: Ambulatory Visit | Attending: Internal Medicine

## 2016-05-14 ENCOUNTER — Encounter (HOSPITAL_COMMUNITY): Payer: Self-pay

## 2016-05-14 DIAGNOSIS — Z823 Family history of stroke: Secondary | ICD-10-CM | POA: Insufficient documentation

## 2016-05-14 DIAGNOSIS — Z7984 Long term (current) use of oral hypoglycemic drugs: Secondary | ICD-10-CM | POA: Insufficient documentation

## 2016-05-14 DIAGNOSIS — D124 Benign neoplasm of descending colon: Secondary | ICD-10-CM

## 2016-05-14 DIAGNOSIS — K2990 Gastroduodenitis, unspecified, without bleeding: Secondary | ICD-10-CM

## 2016-05-14 DIAGNOSIS — Z79899 Other long term (current) drug therapy: Secondary | ICD-10-CM | POA: Insufficient documentation

## 2016-05-14 DIAGNOSIS — K2950 Unspecified chronic gastritis without bleeding: Secondary | ICD-10-CM | POA: Insufficient documentation

## 2016-05-14 DIAGNOSIS — K648 Other hemorrhoids: Secondary | ICD-10-CM | POA: Insufficient documentation

## 2016-05-14 DIAGNOSIS — I130 Hypertensive heart and chronic kidney disease with heart failure and stage 1 through stage 4 chronic kidney disease, or unspecified chronic kidney disease: Secondary | ICD-10-CM | POA: Insufficient documentation

## 2016-05-14 DIAGNOSIS — Z7901 Long term (current) use of anticoagulants: Secondary | ICD-10-CM | POA: Insufficient documentation

## 2016-05-14 DIAGNOSIS — Z833 Family history of diabetes mellitus: Secondary | ICD-10-CM | POA: Insufficient documentation

## 2016-05-14 DIAGNOSIS — K5730 Diverticulosis of large intestine without perforation or abscess without bleeding: Secondary | ICD-10-CM | POA: Insufficient documentation

## 2016-05-14 DIAGNOSIS — R808 Other proteinuria: Secondary | ICD-10-CM | POA: Insufficient documentation

## 2016-05-14 DIAGNOSIS — N183 Chronic kidney disease, stage 3 (moderate): Secondary | ICD-10-CM | POA: Insufficient documentation

## 2016-05-14 DIAGNOSIS — E785 Hyperlipidemia, unspecified: Secondary | ICD-10-CM | POA: Insufficient documentation

## 2016-05-14 DIAGNOSIS — R188 Other ascites: Secondary | ICD-10-CM | POA: Insufficient documentation

## 2016-05-14 DIAGNOSIS — I447 Left bundle-branch block, unspecified: Secondary | ICD-10-CM | POA: Insufficient documentation

## 2016-05-14 DIAGNOSIS — K3189 Other diseases of stomach and duodenum: Secondary | ICD-10-CM | POA: Insufficient documentation

## 2016-05-14 DIAGNOSIS — I5022 Chronic systolic (congestive) heart failure: Secondary | ICD-10-CM | POA: Insufficient documentation

## 2016-05-14 DIAGNOSIS — D123 Benign neoplasm of transverse colon: Secondary | ICD-10-CM | POA: Insufficient documentation

## 2016-05-14 DIAGNOSIS — Z841 Family history of disorders of kidney and ureter: Secondary | ICD-10-CM | POA: Insufficient documentation

## 2016-05-14 DIAGNOSIS — I429 Cardiomyopathy, unspecified: Secondary | ICD-10-CM | POA: Insufficient documentation

## 2016-05-14 DIAGNOSIS — Z8052 Family history of malignant neoplasm of bladder: Secondary | ICD-10-CM | POA: Insufficient documentation

## 2016-05-14 DIAGNOSIS — E1122 Type 2 diabetes mellitus with diabetic chronic kidney disease: Secondary | ICD-10-CM | POA: Insufficient documentation

## 2016-05-14 DIAGNOSIS — K2970 Gastritis, unspecified, without bleeding: Secondary | ICD-10-CM

## 2016-05-14 DIAGNOSIS — Z8249 Family history of ischemic heart disease and other diseases of the circulatory system: Secondary | ICD-10-CM | POA: Insufficient documentation

## 2016-05-14 DIAGNOSIS — R195 Other fecal abnormalities: Secondary | ICD-10-CM

## 2016-05-14 DIAGNOSIS — D649 Anemia, unspecified: Secondary | ICD-10-CM | POA: Insufficient documentation

## 2016-05-14 DIAGNOSIS — I4891 Unspecified atrial fibrillation: Secondary | ICD-10-CM | POA: Insufficient documentation

## 2016-05-14 HISTORY — PX: COLONOSCOPY WITH PROPOFOL: SHX5780

## 2016-05-14 HISTORY — PX: ESOPHAGOGASTRODUODENOSCOPY (EGD) WITH PROPOFOL: SHX5813

## 2016-05-14 LAB — GLUCOSE, CAPILLARY: GLUCOSE-CAPILLARY: 83 mg/dL (ref 65–99)

## 2016-05-14 SURGERY — COLONOSCOPY WITH PROPOFOL
Anesthesia: Monitor Anesthesia Care

## 2016-05-14 MED ORDER — PROPOFOL 10 MG/ML IV BOLUS
INTRAVENOUS | Status: AC
  Filled 2016-05-14: qty 20

## 2016-05-14 MED ORDER — PROPOFOL 10 MG/ML IV BOLUS
INTRAVENOUS | Status: DC | PRN
  Administered 2016-05-14 (×2): 40 mg via INTRAVENOUS
  Administered 2016-05-14: 10 mg via INTRAVENOUS
  Administered 2016-05-14 (×2): 20 mg via INTRAVENOUS
  Administered 2016-05-14 (×2): 10 mg via INTRAVENOUS
  Administered 2016-05-14 (×2): 20 mg via INTRAVENOUS
  Administered 2016-05-14 (×2): 40 mg via INTRAVENOUS
  Administered 2016-05-14 (×3): 10 mg via INTRAVENOUS
  Administered 2016-05-14 (×2): 20 mg via INTRAVENOUS

## 2016-05-14 MED ORDER — BUTAMBEN-TETRACAINE-BENZOCAINE 2-2-14 % EX AERO
INHALATION_SPRAY | CUTANEOUS | Status: DC | PRN
  Administered 2016-05-14: 1 via TOPICAL

## 2016-05-14 MED ORDER — PHENYLEPHRINE HCL 10 MG/ML IJ SOLN
INTRAMUSCULAR | Status: DC | PRN
  Administered 2016-05-14: 40 ug via INTRAVENOUS

## 2016-05-14 MED ORDER — SODIUM CHLORIDE 0.9 % IV SOLN
INTRAVENOUS | Status: DC
Start: 2016-05-14 — End: 2016-05-14

## 2016-05-14 MED ORDER — PHENYLEPHRINE 40 MCG/ML (10ML) SYRINGE FOR IV PUSH (FOR BLOOD PRESSURE SUPPORT)
PREFILLED_SYRINGE | INTRAVENOUS | Status: AC
  Filled 2016-05-14: qty 10

## 2016-05-14 MED ORDER — LACTATED RINGERS IV SOLN
INTRAVENOUS | Status: DC
Start: 2016-05-14 — End: 2016-05-14
  Administered 2016-05-14: 1000 mL via INTRAVENOUS

## 2016-05-14 SURGICAL SUPPLY — 24 items

## 2016-05-14 NOTE — Anesthesia Preprocedure Evaluation (Addendum)
Anesthesia Evaluation  Patient identified by MRN, date of birth, ID band Patient awake    Reviewed: Allergy & Precautions, NPO status , Patient's Chart, lab work & pertinent test results  Airway Mallampati: I  TM Distance: >3 FB Neck ROM: Full    Dental  (+) Teeth Intact, Dental Advisory Given   Pulmonary neg pulmonary ROS,    breath sounds clear to auscultation       Cardiovascular hypertension, Pt. on medications and Pt. on home beta blockers + CAD and +CHF  + dysrhythmias Atrial Fibrillation  Rhythm:Regular Rate:Normal     Neuro/Psych negative neurological ROS  negative psych ROS   GI/Hepatic negative GI ROS, Neg liver ROS,   Endo/Other  diabetes, Type 2, Oral Hypoglycemic Agents  Renal/GU   negative genitourinary   Musculoskeletal negative musculoskeletal ROS (+)   Abdominal   Peds negative pediatric ROS (+)  Hematology negative hematology ROS (+)   Anesthesia Other Findings   Reproductive/Obstetrics negative OB ROS                             01/2016 EKG: atrial fibrillation.  01/2016 Echo - Left ventricle: The cavity size was severely dilated. Systolic   function was severely reduced. The estimated ejection fraction   was in the range of 15-20%. Diffuse hypokinesis. Doppler   parameters are consistent with elevated ventricular end-diastolic   filling pressure. - Ventricular septum: Septal motion showed paradox. - Aortic valve: There was mild to moderate regurgitation. - Mitral valve: There was moderate regurgitation. - Left atrium: The atrium was massively dilated. - Right ventricle: The cavity size was moderately dilated. Wall   thickness was normal. Systolic function was moderately reduced. - Right atrium: The atrium was severely dilated. - Atrial septum: There was a small atrial septal defect with left   to right flow. - Tricuspid valve: There was moderate regurgitation. -  Pulmonary arteries: Systolic pressure was mildly increased. PA   peak pressure: 41 mm Hg (S). - Pericardium, extracardiac: A mild pericardial effusion was   identified posterior to the heart. Features were not consistent   with tamponade physiology.   Anesthesia Physical Anesthesia Plan  ASA: IV  Anesthesia Plan: MAC   Post-op Pain Management:    Induction: Intravenous  Airway Management Planned: Natural Airway  Additional Equipment:   Intra-op Plan:   Post-operative Plan:   Informed Consent: I have reviewed the patients History and Physical, chart, labs and discussed the procedure including the risks, benefits and alternatives for the proposed anesthesia with the patient or authorized representative who has indicated his/her understanding and acceptance.   Dental advisory given  Plan Discussed with: CRNA  Anesthesia Plan Comments: (Slow, gentle induction. )       Anesthesia Quick Evaluation

## 2016-05-14 NOTE — Discharge Instructions (Signed)
Esofagogastroduodenoscopia (Esophagogastroduodenoscopy) La esofagogastroduodenoscopia (EGD) es un procedimiento que se utiliza para examinar el revestimiento del esfago, del estmago y de la primera porcin del intestino delgado (duodeno). Para observar estos rganos, se introduce por la garganta un tubo largo, flexible y que emite luz (endoscopio). Este procedimiento se hace para detectar problemas o anomalas, como inflamacin, sangrado, lceras o tumores, a fin de tratarlos. El procedimiento dura entre 5 y 88minutos. Por lo general, es un procedimiento ambulatorio, pero puede tener que realizarse en un hospital en casos de Freight forwarder. INFORME A SU MDICO:  Cualquier alergia que tenga.  Todos los [employer], incluidos vitaminas, hierbas, gotas oftlmicas, cremas y medicamentos de venta libre.  Problemas previos que usted o los [employer] de su familia hayan tenido con el uso de anestsicos.  Enfermedades de la sangre que tenga.  Si tiene cirugas previas.  Enfermedades que tenga. RIESGOS Y COMPLICACIONES En general, se trata de un procedimiento seguro. Sin embargo, pueden ocurrir [employer], que [employer] siguientes:  Infeccin.  Hemorragia.  Desgarro (perforacin) del esfago, el estmago o el duodeno.  Dificultad o incapacidad para respirar.  Sudoracin excesiva.  Espasmos de la laringe.  Latidos cardacos lentos.  Presin arterial baja. ANTES DEL PROCEDIMIENTO  No coma ni beba nada despus de la medianoche anterior al procedimiento o segn lo que le haya indicado el mdico.  No tome sus medicamentos habituales antes del procedimiento si el mdico le indica que no lo haga. Consulte a su mdico si debe cambiar o suspender estos medicamentos.  Si [place] dentadura postiza, est preparado para quitrsela antes del procedimiento.  Pdale a alguna persona que lo lleve a su casa luego del procedimiento. PROCEDIMIENTO  Es posible que le rocen la  garganta con un anestsico (anestesia local) para su comodidad y para evitar que tosa o le den nuseas.  Le colocarn una va intravenosa (IV) en la mano o el brazo. Recibir [employer] y lquidos a travs de este tubo.  Le administrarn un medicamento para ayudarlo a relajarse (sedante).  Le administrarn un analgsico a travs de la va intravenosa (IV).  Le colocarn un protector bucal para proteger los dientes y evitar que muerda el endoscopio.  Le pedirn que se recueste sobre su costado izquierdo.  Le introducirn el endoscopio por la garganta, hacia el esfago, el estmago y [place].  Se introducir aire a travs del endoscopio para permitirle al mdico observar con claridad el revestimiento del esfago.  Se examinar el revestimiento del esfago, del estmago y del duodeno. Durante el examen, el mdico:  Extirpar tejido para examinarlo con un microscopio (biopsia) para ver si hay inflamacin, infeccin u otros problemas mdicos.  Extirpar los tumores.  Extirpar los objetos (cuerpos extraos) que estn atascados.  Tratar cualquier sangrado con medicamentos u otros dispositivos que evitan que los tejidos sangren (cauterizacin con Freight forwarder, dispositivos de clipado).  Ensanchar (dilatar) o estirar las reas estrechas del esfago y del [place].  Se retirar el endoscopio. DESPUS DEL PROCEDIMIENTO  Lo llevarn a un rea de recuperacin para su observacin. Le controlarn con frecuencia la presin arterial, la frecuencia cardaca, la frecuencia respiratoria y Retail buyer de oxgeno en la sangre hasta que haya desaparecido el efecto de los medicamentos administrados.  No coma ni beba nada hasta que el efecto del anestsico haya desaparecido y vuelva a tener reflejo farngeo. Podra ahogarse.  El mdico debera poder hablar con usted acerca de los [place] obtenidos. Se tardar ms tiempo en hablar de los resultados del estudio si  se tomaron biopsias.   Esta informacin no  tiene Marine scientist el consejo del mdico. Asegrese de hacerle al mdico cualquier pregunta que tenga.   Document Released: 11/29/2012 Document Revised: 08/25/2014 Elsevier Interactive Patient Education 2016 [place] (Colonoscopy) Ardelia Mems colonoscopa es un examen para evaluar el colon. Este examen ayuda a encontrar bultos (tumores), masas (plipos), hemorragias y enrojecimiento e hinchazn (inflamacin) en el colon.  ANTES DEL PROCEDIMIENTO  Consulte a su mdico si debe cambiar o suspender los medicamentos que toma habitualmente.  Es posible que tenga que tomar una gran cantidad de un lquido especial (preparacin del colon por va oral). Debe comenzar a Recruitment consultant anterior al procedimiento. El lquido har que evace material fecal (heces) lquida. De esta manera, se limpiar el colon.  No coma ni beba nada ms una vez que haya comenzado con la preparacin del colon, a menos que el mdico le indique que es seguro [place].  Haga arreglos para que alguien lo lleve a su casa despus del procedimiento. PROCEDIMIENTO  Le administrarn un medicamento para ayudarlo a relajarse (sedante).  Se recostar de costado con las rodillas flexionadas.  Le insertarn un tubo con una cmara en el extremo a travs del orificio anal (recto) y dentro del colon. Se envan imgenes a la pantalla de una computadora. Su mdico detectar si tiene alguna anormalidad.  Es posible que el mdico tome una muestra de tejido (biopsia) del colon para estudiarlo ms atentamente.  El procedimiento finaliza cuando el mdico examina todo el colon. DESPUS DEL PROCEDIMIENTO  No conduzca vehculos durante las 24horas posteriores al examen.  Es posible que encuentre una pequea cantidad de sangre en la materia fecal. Esto es normal.  Podr despedir gases y Best boy clicos en el estmago (abdominales). Esto es normal.  Pregunte cuando estarn listos los [employer]. Asegrese de  [employer].   Esta informacin no tiene Marine scientist el consejo del mdico. Asegrese de hacerle al mdico cualquier pregunta que tenga.   Document Released: 11/19/2010 Document Revised: 08/09/2013 Elsevier Interactive Patient Education [employer].

## 2016-05-14 NOTE — Interval H&P Note (Signed)
History and Physical Interval Note: ECL planned today to eval heme + stools The nature of the procedure, as well as the risks, benefits, and alternatives were carefully and thoroughly reviewed with the patient. Ample time for discussion and questions allowed. The patient understood, was satisfied, and agreed to proceed.  Xarelto on hold appropriately    05/14/2016 12:17 PM  Kyle Burke  has presented today for surgery, with the diagnosis of Heme positive stool  The various methods of treatment have been discussed with the patient and family. After consideration of risks, benefits and other options for treatment, the patient has consented to  Procedure(s): COLONOSCOPY WITH PROPOFOL (N/A) ESOPHAGOGASTRODUODENOSCOPY (EGD) WITH PROPOFOL (N/A) as a surgical intervention .  The patient's history has been reviewed, patient examined, no change in status, stable for surgery.  I have reviewed the patient's chart and labs.  Questions were answered to the patient's satisfaction.     Kyle Burke M

## 2016-05-14 NOTE — Op Note (Addendum)
[facility] Patient Name: Kyle Burke Procedure Date: 05/14/2016 MRN: [id number] Attending MD: Jerene Bears , MD Date of Birth: [birth year] CSN: [id number] Age: 70 Admit Type: Outpatient Procedure:                Upper GI endoscopy Indications:              Heme positive stool Providers:                Lajuan Lines. Hilarie Fredrickson, MD, Cleda Daub, RN, Cherylynn Ridges, Technician, Dion Saucier, CRNA Referring MD:             Mack Hook, MD Medicines:                Monitored Anesthesia Care Complications:            No immediate complications. Estimated Blood Loss:     Estimated blood loss was minimal. Procedure:                Pre-Anesthesia Assessment:                           - Prior to the procedure, a History and Physical                            was performed, and patient medications and                            allergies were reviewed. The patient's tolerance of                            previous anesthesia was also reviewed. The risks                            and benefits of the procedure and the sedation                            options and risks were discussed with the patient.                            All questions were answered, and informed consent                            was obtained. Prior Anticoagulants: The patient has                            taken Xarelto (rivaroxaban), last dose was 2 days                            prior to procedure. ASA Grade Assessment: III - A                            patient with severe systemic disease. After  reviewing the risks and benefits, the patient was                            deemed in satisfactory condition to undergo the                            procedure.                           After obtaining informed consent, the endoscope was                            passed under direct vision. Throughout the                            procedure, the  patient's blood pressure, pulse, and                            oxygen saturations were monitored continuously. The                            [id number]) scope was introduced through the                            mouth, and advanced to the second part of duodenum.                            The upper GI endoscopy was accomplished without                            difficulty. The patient tolerated the procedure                            well. Scope In: Scope Out: Findings:      The examined esophagus was normal.      Localized mild inflammation characterized by erythema was found in the       gastric antrum and in the prepyloric region of the stomach. Biopsies       were taken with a cold forceps for histology and Helicobacter pylori       testing.      Localized mildly erythematous mucosa and with no stigmata of bleeding       was found in the duodenal bulb.      The second portion of the duodenum was normal. Impression:               - Normal esophagus.                           - Gastritis. Biopsied.                           - Erythematous duodenopathy.                           - Normal second portion of the duodenum. Moderate Sedation:      N/A Recommendation:           -  Patient has a contact number available for                            emergencies. The signs and symptoms of potential                            delayed complications were discussed with the                            patient. Return to normal activities tomorrow.                            Written discharge instructions were provided to the                            patient.                           - Resume previous diet.                           - Await pathology results.                           - Continue present medications.                           - See the other procedure note for documentation of                            additional recommendations. Procedure Code(s):        ---  Professional ---                           [phone], Esophagogastroduodenoscopy, flexible,                            transoral; with biopsy, single or multiple Diagnosis Code(s):        --- Professional ---                           K29.70, Gastritis, unspecified, without bleeding                           K31.89, Other diseases of stomach and duodenum                           R19.5, Other fecal abnormalities CPT copyright 2016 American Medical Association. All rights reserved. The codes documented in this report are preliminary and upon coder review may  be revised to meet current compliance requirements. Jerene Bears, MD 05/14/2016 12:54:47 PM This report has been signed electronically. Number of Addenda: 0

## 2016-05-14 NOTE — Anesthesia Postprocedure Evaluation (Signed)
Anesthesia Post Note  Patient: Kyle Burke  Procedure(s) Performed: Procedure(s) (LRB): COLONOSCOPY WITH PROPOFOL (N/A) ESOPHAGOGASTRODUODENOSCOPY (EGD) WITH PROPOFOL (N/A)  Patient location during evaluation: PACU Anesthesia Type: MAC Level of consciousness: awake and alert Pain management: pain level controlled Vital Signs Assessment: post-procedure vital signs reviewed and stable Respiratory status: spontaneous breathing, nonlabored ventilation, respiratory function stable and patient connected to nasal cannula oxygen Cardiovascular status: stable and blood pressure returned to baseline Anesthetic complications: no    Last Vitals:  Vitals:   05/14/16 1139 05/14/16 1332  BP: 132/84 (!) 108/56  Pulse: 67 75  Resp: 15 19  Temp: 36.6 C     Last Pain:  Vitals:   05/14/16 1332  TempSrc: Oral                 Effie Berkshire

## 2016-05-14 NOTE — Op Note (Signed)
[facility] Patient Name: Kyle Burke Procedure Date: 05/14/2016 MRN: [id number] Attending MD: Jerene Bears , MD Date of Birth: 06/02/1946 CSN: [id number] Age: 70 Admit Type: Outpatient Procedure:                Colonoscopy Indications:              This is the patient's first colonoscopy, Heme                            positive stool Providers:                Lajuan Lines. Hilarie Fredrickson, MD, Cleda Daub, RN, Cherylynn Ridges, Technician, Dion Saucier, CRNA Referring MD:             Mack Hook, MD Medicines:                Monitored Anesthesia Care Complications:            No immediate complications. Estimated Blood Loss:     Estimated blood loss was minimal. Procedure:                Pre-Anesthesia Assessment:                           - Prior to the procedure, a History and Physical                            was performed, and patient medications and                            allergies were reviewed. The patient's tolerance of                            previous anesthesia was also reviewed. The risks                            and benefits of the procedure and the sedation                            options and risks were discussed with the patient.                            All questions were answered, and informed consent                            was obtained. Prior Anticoagulants: The patient has                            taken Xarelto (rivaroxaban), last dose was 2 days                            prior to procedure. ASA Grade Assessment: III - A  patient with severe systemic disease. After                            reviewing the risks and benefits, the patient was                            deemed in satisfactory condition to undergo the                            procedure.                           After obtaining informed consent, the colonoscope                            was passed under direct  vision. Throughout the                            procedure, the patient's blood pressure, pulse, and                            oxygen saturations were monitored continuously. The                            [id number]) scope was introduced through                            the anus and advanced to the the cecum, identified                            by appendiceal orifice and ileocecal valve. The                            colonoscopy was performed without difficulty. The                            patient tolerated the procedure well. The quality                            of the bowel preparation was good. The ileocecal                            valve, appendiceal orifice, and rectum were                            photographed. Scope In: [id number] PM Scope Out: 1:19:56 PM Scope Withdrawal Time: 0 hours 18 minutes 25 seconds  Total Procedure Duration: 0 hours 23 minutes 7 seconds  Findings:      The digital rectal exam was normal.      A 5 mm polyp was found in the proximal transverse colon. The polyp was       sessile. The polyp was removed with a cold snare. Resection and       retrieval were complete.      Two sessile polyps were found in the  descending colon. The polyps were 2       to 3 mm in size. These polyps were removed with a cold biopsy forceps.       Resection and retrieval were complete.      Multiple medium-mouthed diverticula were found in the sigmoid colon,       descending colon, splenic flexure, hepatic flexure and ascending colon.      Internal hemorrhoids were found during retroflexion. The hemorrhoids       were small. Impression:               - One 5 mm polyp in the proximal transverse colon,                            removed with a cold snare. Resected and retrieved.                           - Two 2 to 3 mm polyps in the descending colon,                            removed with a cold biopsy forceps. Resected and                             retrieved.                           - Moderate diverticulosis in the sigmoid colon, in                            the descending colon, at the splenic flexure, at                            the hepatic flexure and in the ascending colon.                           - Internal hemorrhoids. Moderate Sedation:      N/A Recommendation:           - Patient has a contact number available for                            emergencies. The signs and symptoms of potential                            delayed complications were discussed with the                            patient. Return to normal activities tomorrow.                            Written discharge instructions were provided to the                            patient.                           - Resume previous diet.                           -  Continue present medications.                           - Resume Xarelto (rivaroxaban) at prior dose                            tomorrow.                           - Await pathology results.                           - Repeat colonoscopy is recommended. The                            colonoscopy date will be determined after pathology                            results from today's exam become available for                            review. Procedure Code(s):        --- Professional ---                           [phone], Colonoscopy, flexible; with removal of                            tumor(s), polyp(s), or other lesion(s) by snare                            technique                           45380, 34, Colonoscopy, flexible; with biopsy,                            single or multiple Diagnosis Code(s):        --- Professional ---                           D12.3, Benign neoplasm of transverse colon (hepatic                            flexure or splenic flexure)                           D12.4, Benign neoplasm of descending colon                           K64.8, Other hemorrhoids                            R19.5, Other fecal abnormalities                           K57.30, Diverticulosis of large intestine without  perforation or abscess without bleeding CPT copyright 2016 American Medical Association. All rights reserved. The codes documented in this report are preliminary and upon coder review may  be revised to meet current compliance requirements. Jerene Bears, MD 05/14/2016 1:24:31 PM This report has been signed electronically. Number of Addenda: 0

## 2016-05-14 NOTE — Transfer of Care (Signed)
Immediate Anesthesia Transfer of Care Note  Patient: Kyle Burke  Procedure(s) Performed: Procedure(s): COLONOSCOPY WITH PROPOFOL (N/A) ESOPHAGOGASTRODUODENOSCOPY (EGD) WITH PROPOFOL (N/A)  Patient Location: PACU and Endoscopy Unit  Anesthesia Type:MAC  Level of Consciousness: awake, alert , oriented, patient cooperative and responds to stimulation  Airway & Oxygen Therapy: Patient Spontanous Breathing and Patient connected to nasal cannula oxygen  Post-op Assessment: Report given to RN, Post -op Vital signs reviewed and stable and Patient moving all extremities  Post vital signs: Reviewed and stable  Last Vitals:  Vitals:   05/14/16 1139  BP: 132/84  Pulse: 67  Resp: 15  Temp: 36.6 C    Last Pain:  Vitals:   05/14/16 1139  TempSrc: Oral         Complications: No apparent anesthesia complications

## 2016-05-14 NOTE — H&P (View-Only) (Signed)
Subjective:    Patient ID: Kyle Burke, male    DOB: 05-20-1946, 70 y.o.   MRN: [id number]  HPI Kyle Burke is a pleasant 70 year old Hispanic male, in the [place] since 1995, non-English-speaking and discussion was had via an interpreter. Patient known to Dr. Hilarie Burke from recent hospital consultation June 2017 at which time patient was hospitalized with congestive heart failure and anasarca. He did have ascites and a documented EF of around 20-25%. He was diuresed during his hospitalization and also had a diagnostic paracentesis done. Peritoneal fluid albumin was 2.2, he had a high gradient SAAG felt consistent with portal hypertension or heart failure. Platelet count was normal and liver unremarkable by ultrasound and CT.Marland Kitchen He had dramatic improvement in his symptoms during hospitalization. He was also noted to be Hemoccult positive in the setting of Xarelto. It was felt that he should have colonoscopy which was not appropriate during his admission for congestive heart failure. He returns today for post hospital follow-up. In seen by cardiology in follow-up as yet. He says he has been feeling well and has not had any problems with shortness of breath or fluid reaccumulation though he does still have a small amount of fluid in his legs. He has no complaints of abdominal pain, appetite has been good. He has not had any prior GI evaluation with endoscopy or colonoscopy. Labs done 02/13/2016 hemoglobin was 12.3 hematocrit is 37.9 iron studies were unremarkable. He was documented to be Hemoccult-positive just prior to hospitalization.  CT of the abdomen and pelvis done 02/10/2016 and normal was question of wall thickening of the antrum of the stomach and ascites was noted. Other medical problems include atrial fibrillation diabetes mellitus and left bundle branch block.  Review of Systems Pertinent positive and negative review of systems were noted in the above HPI section.  All other review of systems was  otherwise negative.  Outpatient Encounter Prescriptions as of 04/22/2016  Medication Sig  . acetaminophen (TYLENOL) 650 MG CR tablet Take 650 mg by mouth every 8 (eight) hours as needed for pain. Reported on 02/14/2016  . carvedilol (COREG) 12.5 MG tablet Take 1 tab by mouth in the morning and 2 tabs by mouth in the evening. (Patient taking differently: Take 12.5 mg by mouth 2 (two) times daily with a meal. )  . furosemide (LASIX) 40 MG tablet Take 1 tablet (40 mg total) by mouth daily.  Marland Kitchen lisinopril (PRINIVIL,ZESTRIL) 40 MG tablet Take 1 tablet (40 mg total) by mouth daily.  . metFORMIN (GLUCOPHAGE) 500 MG tablet Take 1 tablet (500 mg total) by mouth 2 (two) times daily with a meal.  . potassium chloride (K-DUR) 10 MEQ tablet Take 1 tablet (10 mEq total) by mouth daily.  . rivaroxaban (XARELTO) 20 MG TABS tablet Take 1 tablet (20 mg total) by mouth daily with supper.   No facility-administered encounter medications on file as of 04/22/2016.    No Known Allergies Patient Active Problem List   Diagnosis Date Noted  . Essential hypertension 06/15/2015  . Normocytic anemia 04/07/2015  . Controlled type 2 diabetes mellitus with stage 3 chronic kidney disease, without long-term current use of insulin ([place])   . Dyslipidemia   . Microalbuminuria due to type 2 diabetes mellitus ([place])   . Chronic systolic CHF (congestive heart failure) ([place]) 12/15/2014  . Atrial fibrillation ([place]) 09/06/2014  . HTN (hypertension) 09/06/2014  . LBBB (left bundle branch block) 09/06/2014   Social History   Social History  . Marital status: Married  Spouse name: N/A  . Number of children: 5  . Years of education: 9   Occupational History  . Concrete constrution     [employer]   Social History Main Topics  . Smoking status: Never Smoker  . Smokeless tobacco: Never Used  . Alcohol use No     Comment: rare occ., formerly more  . Drug use: No  . Sexual activity: Yes     Comment: But not regular    Other Topics Concern  . Not on file   Social History Narrative   Originally from [place]   Came to [place] In 1995   Lives at home with wife, a son and his wife and 3 grandchildren.    [name]'s family history includes Cancer (age of onset: 39) in his sister; Diabetes in his brother and father; Heart disease in his father and mother; Kidney disease in his father; Stroke in his mother.      Objective:    Vitals:   04/22/16 1121  BP: 112/76  Pulse: 80    Physical Exam  well-developed older Hispanic male in no acute distress, pleasant, non-English-speaking, telephone interpreter was used. The blood pressure 112/76, pulse 80, BMI 30.1 HEENT; nontraumatic normocephalic EOMI PERRLA sclera anicteric, Cardiovascular ;regular rate and rhythm with S1-S2 does have a systolic murmur, Pulmonary ;clear bilaterally, Abdomen; soft nondistended no appreciable ascites nontender bowel sounds present no palpable mass or hepatosplenomegaly, Rectal ;exam not done, Extremities ;1+ edema in the ankles, Neuropsych ;mood and affect appropriate       Assessment & Plan:   #52 70 year old Hispanic male non-English-speaking with Hemoccult-positive stool in setting of Xarelto. Patient has not had any prior endoscopic evaluation. Rule out occult colon lesion/neoplasm or chronic gastropathy #2 congestive heart failure-EF 25% #3 adult-onset diabetes mellitus #4 left bundle branch block #5 atrial fibrillation #6 chronic anticoagulation with Xarelto #7 recent admission with congestive heart failure/anasarca and ascites. Ascites determined to be  Cardiogenic  Plan; patient will be scheduled for colonoscopy and EGD with Dr. Hilarie Burke. Both procedures discussed in detail with the patient via interpreter and he is agreeable to proceed. Procedures will be scheduled at [facility] given his cardiomyopathy We will communicate with his cardiologist Dr. Cathie Burke  to assure that the holding Xarelto for  24-48  hours prior to his procedure is reasonable for this patient.  Amy S Esterwood PA-C 04/22/2016   Cc: Kyle Hook, MD  Addendum: Reviewed and agree with initial management. Jerene Bears, MD

## 2016-05-15 ENCOUNTER — Encounter (HOSPITAL_COMMUNITY): Payer: Self-pay | Admitting: Internal Medicine

## 2016-05-19 ENCOUNTER — Encounter: Payer: Self-pay | Admitting: Internal Medicine

## 2016-05-26 ENCOUNTER — Encounter: Payer: Self-pay | Admitting: Cardiovascular Disease

## 2016-06-05 ENCOUNTER — Encounter: Payer: Self-pay | Admitting: Cardiovascular Disease

## 2016-06-05 ENCOUNTER — Ambulatory Visit (INDEPENDENT_AMBULATORY_CARE_PROVIDER_SITE_OTHER): Payer: No Typology Code available for payment source | Admitting: Cardiovascular Disease

## 2016-06-05 VITALS — BP 128/82 | HR 85 | Ht 71.0 in | Wt 233.0 lb

## 2016-06-05 DIAGNOSIS — I482 Chronic atrial fibrillation, unspecified: Secondary | ICD-10-CM

## 2016-06-05 DIAGNOSIS — E785 Hyperlipidemia, unspecified: Secondary | ICD-10-CM

## 2016-06-05 DIAGNOSIS — I5022 Chronic systolic (congestive) heart failure: Secondary | ICD-10-CM

## 2016-06-05 NOTE — Progress Notes (Signed)
Cardiology Office Note   Date:  06/05/2016   ID:  Kyle, Burke [birth year], MRN [id number]  PCP:  Mack Hook, MD  Cardiologist:   Mertie Moores, MD   Chief Complaint  Patient presents with  . Congestive Heart Failure   Problem List 1. Atrial fibrillation 2. CHF  - EF  25% 3. LBBB 4. Diabetes Mellitus    History of Present Illness: Kyle Burke is a 70 y.o. male orginially from [place], who presents for evaluation of newly diagnosed atrial fib.   The patient speaks some english.  No intrepreter was available.  Was an urgent work in from [employer] - Dr. Amil Amen. He presented to Dr. Mahala Menghini with palpitations in his left chest.  Was found to have a-fib and was referred to [place].  No CP , no dyspnea.  Works in Loss adjuster, chartered.  Non smoker Drinks beer occasionally   Feb. 3, 2015:  Kyle Burke is feeling better.  Is seen with intrepreter , [name].   We performed an echocardiogram which reveals severe left ventricle systolic dysfunction. His ejection fraction is 25-30%. He was referred to [place] several weeks ago for evaluatino of A-fib with RVR We started him on Xarelto, metooprolol,  He had some slight dizziness when he first started the metoprolol but this is largely resolved. He remains on diltiazem.  He is still at work . Breathing is much better.   October 19, 2014:  Pt is seen today with intrepreter - [name].  He did not tolerate the metoprolol - stopped it on his own.  Dr. Tawni Carnes restarted Diltiazem 240  December 15, 2014; Pt was seen with intrepreter, [name].   He is doing well.  Working 4 times a week.  Also works in his garden on his off days.  Has some pain in his legs. Not cramping   June 29 , 2016:  Seen with Intrepreter, [name].  Breathing is ok.  Generally feeling stronger since we have started the CHF meds. Has occasional palpitations at night  Still able to work 4 times a week.   Jan. 10, 2017: Mr.  Cooper Burke is doing well .  Seen with [name] ( Interpreter )  No CP or dyspnea.  Occasional palpitations,  Only feels it occasionally .  Tolerating his meds well Takes his meds every day   December 05, 2015 Seen with Cresenciano Genre [facility] )  Staying active.  Working , no CP or dyspnea.   March 05, 2016:    Kyle Burke is seen today .  Intrepretation by the [employer] work station   Was in the hospital for several days  Lasix was incresed Did not tolerate the aldactone - caused fatigue   Is feeling much better  Oct. 19,2017: Used the video chat interpreter. Has a colonoscopy .- all was ok  No CP or dyspnea .   When he walks, he has some fatigue      Past Medical History:  Diagnosis Date  . Atrial fibrillation ([place]) 2016  . CAD (coronary artery disease)   . Diabetes mellitus 2001  . Hypertension 2006  . Idiopathic cardiomyopathy ([place]) 11/01/2014   Followed by Dr. Acie Fredrickson, Cardiology  EF 25%  Adenosine Cardiolite did not support ischemic etiology  . Pleural effusion     Past Surgical History:  Procedure Laterality Date  . COLONOSCOPY WITH PROPOFOL N/A 05/14/2016   Procedure: COLONOSCOPY WITH PROPOFOL;  Surgeon: Jerene Bears, MD;  Location: [facility];  Service: Gastroenterology;  Laterality:  N/A;  . ESOPHAGOGASTRODUODENOSCOPY (EGD) WITH PROPOFOL N/A 05/14/2016   Procedure: ESOPHAGOGASTRODUODENOSCOPY (EGD) WITH PROPOFOL;  Surgeon: Jerene Bears, MD;  Location: [facility];  Service: Gastroenterology;  Laterality: N/A;  . HERNIA REPAIR    . INCISE AND DRAIN ABCESS  1986   on leg  . INGUINAL HERNIA REPAIR  01/16/2012   Procedure: LAPAROSCOPIC INGUINAL HERNIA;  Surgeon: Gayland Curry, MD,FACS;  Location: [facility] ORS;  Service: General;  Laterality: Left;  Laparoscopic incarcerated Left inguinal hernia repair with mesh, umbilical hernia repair  . UMBILICAL HERNIA REPAIR  01/16/2012   Procedure: HERNIA REPAIR UMBILICAL ADULT;  Surgeon: Gayland Curry, MD,FACS;  Location: [facility];   Service: General;  Laterality: N/A;     Current Outpatient Prescriptions  Medication Sig Dispense Refill  . acetaminophen (TYLENOL) 650 MG CR tablet Take 650 mg by mouth every 8 (eight) hours as needed for pain. Reported on 02/14/2016    . carvedilol (COREG) 12.5 MG tablet 1 tab by mouth twice daily 60 tablet 11  . furosemide (LASIX) 40 MG tablet 1 1/2 tabs by mouth in the morning daily 45 tablet 11  . lisinopril (PRINIVIL,ZESTRIL) 40 MG tablet Take 1 tablet (40 mg total) by mouth daily. 30 tablet 11  . metFORMIN (GLUCOPHAGE) 500 MG tablet Take 1 tablet (500 mg total) by mouth 2 (two) times daily with a meal. 60 tablet 11  . potassium chloride (K-DUR) 10 MEQ tablet Take 1 tablet (10 mEq total) by mouth daily. 30 tablet 11  . rivaroxaban (XARELTO) 20 MG TABS tablet Take 1 tablet (20 mg total) by mouth daily with supper. 30 tablet 11   No current facility-administered medications for this visit.     Allergies:   Review of patient's allergies indicates no known allergies.    Social History:  The patient  reports that he has never smoked. He has never used smokeless tobacco. He reports that he does not drink alcohol or use drugs.   Family History:  The patient's family history includes Cancer (age of onset: 51) in his sister; Diabetes in his brother and father; Heart disease in his father and mother; Kidney disease in his father; Stroke in his mother.    ROS:  Please see the history of present illness.   Otherwise, review of systems are positive for none.   All other systems are reviewed and negative.    PHYSICAL EXAM: VS:  BP 128/82   Pulse 85   Ht 5\' 11"  (1.803 m)   Wt 233 lb (105.7 kg)   BMI 32.50 kg/m  , BMI Body mass index is 32.5 kg/m. GEN: Well nourished, well developed, in no acute distress  HEENT: normal  Neck: no JVD, carotid bruits, or masses Cardiac: Irreg. Irreg. ; soft systolic  Murmur. No , rubs, or gallops,no edema  Respiratory:  clear to auscultation bilaterally,  normal work of breathing GI: soft, nontender, nondistended, + BS MS: no deformity or atrophy  Skin: warm and dry, no rash Neuro:  Strength and sensation are intact Psych: euthymic mood, full affect   EKG:  EKG is not ordered today.  Recent Labs: 02/08/2016: ALT 32 02/09/2016: TSH 3.751 02/10/2016: B Natriuretic Peptide 1,573.8 04/22/2016: BUN 27; Creatinine, Ser 1.08; Hemoglobin 12.3; Platelets 174.0; Potassium 4.6; Sodium 137    Lipid Panel    Component Value Date/Time   CHOL 151 06/26/2015 1104   TRIG 43 06/26/2015 1104   HDL 55 06/26/2015 1104   CHOLHDL 4.0 09/15/2014 1037   VLDL  12 09/15/2014 1037   LDLCALC 87 06/26/2015 1104      Wt Readings from Last 3 Encounters:  06/05/16 233 lb (105.7 kg)  05/14/16 230 lb (104.3 kg)  05/08/16 234 lb (106.1 kg)      Other studies Reviewed: Additional studies/ records that were reviewed today include: records from Dr. Tawni Carnes and Lovena Le  Review of the above records demonstrates: atrial fib    ASSESSMENT AND PLAN:  1.  Atrial fib with RVR.   His atrial fib rate is well controlled.  Continue current meds.  Continue xarelto - he gets at [facility]  2. Chronic systolic congestive heart failure:   Abdulaziz is doing much better He was hospitalized last month for  CHF exacerbation. His Lasix was increased and he is doing quite a bit better. He does not report any extra salt intake. He seems to be tolerating the Lasix well. He did not tolerate the Aldactone. We'll continue with his current medications  3. Anemia:   Will continue to see Dr. Tawni Carnes .    Will see him in 6  months  .  Current medicines are reviewed at length with the patient today.  The patient does not have concerns regarding medicines. .    Labs/ tests ordered today include:   No orders of the defined types were placed in this encounter.   Signed, Mertie Moores, MD  06/05/2016 8:43 AM    [facility] [place], [place], [place]   [address] Phone: [phone]; Fax: [phone]

## 2016-06-05 NOTE — Patient Instructions (Signed)

## 2016-06-17 ENCOUNTER — Telehealth: Payer: Self-pay | Admitting: Internal Medicine

## 2016-06-17 NOTE — Telephone Encounter (Signed)
Patient coming in for acute visit on 06/18/2016.  Patient stated he is feeling tired.

## 2016-06-18 ENCOUNTER — Ambulatory Visit (INDEPENDENT_AMBULATORY_CARE_PROVIDER_SITE_OTHER): Payer: Self-pay | Admitting: Internal Medicine

## 2016-06-18 VITALS — BP 124/76 | HR 66 | Ht 71.0 in | Wt 236.5 lb

## 2016-06-18 DIAGNOSIS — R601 Generalized edema: Secondary | ICD-10-CM

## 2016-06-18 DIAGNOSIS — I5022 Chronic systolic (congestive) heart failure: Secondary | ICD-10-CM

## 2016-06-18 MED ORDER — FUROSEMIDE 40 MG PO TABS: ORAL_TABLET | ORAL | 11 refills | Status: DC

## 2016-06-18 NOTE — Progress Notes (Signed)
   Subjective:    Patient ID: Kyle Burke, male    DOB: 02/20/1946, 70 y.o.   MRN: [id number]  HPI  Has felt fatigued for past 2 weeks.  Much worse about 6 days.  Gets tired walking--has had to sit.  Feels short of breath since Friday, especially when he walks as well.   His daughter in law states when he complains of feeling tired, he gets a purple discoloration around his lips/mouth.  Has noted 3 times after he is up walking.  Lasts 15-20 minutes He has had a bit more swelling of his ankles/ legs. Does not add salt to food and not eating salty prepared foods. No chest pain No melena or hematochezia. States has not missed his meds Still with good urine output after his morning Lasix. Does not want to go back on Spironolactone.  Did not feel well taking it.  Had colonoscopy and EGD in September.  Benign Adenoma and Chronic inactive gastritis diagnosed.  Current Meds  Medication Sig  . acetaminophen (TYLENOL) 650 MG CR tablet Take 650 mg by mouth every 8 (eight) hours as needed for pain. Reported on 02/14/2016  . carvedilol (COREG) 12.5 MG tablet 1 tab by mouth twice daily  . furosemide (LASIX) 40 MG tablet 1 1/2 tabs by mouth in the morning daily  . lisinopril (PRINIVIL,ZESTRIL) 40 MG tablet Take 1 tablet (40 mg total) by mouth daily.  . metFORMIN (GLUCOPHAGE) 500 MG tablet Take 1 tablet (500 mg total) by mouth 2 (two) times daily with a meal.  . potassium chloride (K-DUR) 10 MEQ tablet Take 1 tablet (10 mEq total) by mouth daily.  . rivaroxaban (XARELTO) 20 MG TABS tablet Take 1 tablet (20 mg total) by mouth daily with supper.   No Known Allergies     Review of Systems     Objective:   Physical Exam Appears fatigued HEENT:  PERRL, EOMI, TMs pearly gray, throat without injection. Neck:  Supple, no adenopathy Chest:  CTA CV:  Irregularly irregular, no murmur or rub, possibly mild JVD, Radial pulses normal and equal Abd:  S, NT, No HSM or mass, + BS, no fluid wave LE:   Moderate pitting edema of ankles to thighs, a bit in low back as well        Assessment & Plan:  1.  Dilated Cardiomyopathy:  Unwilling to consider readdition of Spironolactone.  Increase Furosemide to 60 mg twice daily.   CBC, CMP.   Check for worsening anemia/oncotic pressure  If no response to increased Lasix, may need to get in sooner with Dr. Acie Fredrickson for follow up--was to follow up with him in 6 months when seen last month.

## 2016-06-19 LAB — SPECIMEN STATUS

## 2016-06-19 LAB — COMPREHENSIVE METABOLIC PANEL
ALBUMIN: 3.7 g/dL (ref 3.6–4.8)
ALT: 13 IU/L (ref 0–44)
AST: 20 IU/L (ref 0–40)
Albumin/Globulin Ratio: 1.3 (ref 1.2–2.2)
Alkaline Phosphatase: 110 IU/L (ref 39–117)
BUN / CREAT RATIO: 21 (ref 10–24)
BUN: 23 mg/dL (ref 8–27)
Bilirubin Total: 1.6 mg/dL — ABNORMAL HIGH (ref 0.0–1.2)
CALCIUM: 9 mg/dL (ref 8.6–10.2)
CHLORIDE: 99 mmol/L (ref 96–106)
CO2: 28 mmol/L (ref 18–29)
CREATININE: 1.08 mg/dL (ref 0.76–1.27)
GFR, EST AFRICAN AMERICAN: 81 mL/min/{1.73_m2} (ref >59)
GFR, EST NON AFRICAN AMERICAN: 70 mL/min/{1.73_m2} (ref >59)
GLUCOSE: 113 mg/dL — AB (ref 65–99)
Globulin, Total: 2.9 g/dL (ref 1.5–4.5)
Potassium: 4.1 mmol/L (ref 3.5–5.2)
Sodium: 140 mmol/L (ref 134–144)
TOTAL PROTEIN: 6.6 g/dL (ref 6.0–8.5)

## 2016-06-24 NOTE — Progress Notes (Signed)
Yes

## 2016-06-25 LAB — CBC WITH DIFFERENTIAL/PLATELET
BASOS ABS: 0 10*3/uL (ref 0.0–0.2)
Basos: 0 %
EOS (ABSOLUTE): 0.1 10*3/uL (ref 0.0–0.4)
Eos: 2 %
Hematocrit: 36.8 % — ABNORMAL LOW (ref 37.5–51.0)
Hemoglobin: 12.1 g/dL — ABNORMAL LOW (ref 12.6–17.7)
IMMATURE GRANS (ABS): 0 10*3/uL (ref 0.0–0.1)
Immature Granulocytes: 0 %
Lymphocytes Absolute: 1.1 10*3/uL (ref 0.7–3.1)
Lymphs: 24 %
MCH: 27.8 pg (ref 26.6–33.0)
MCHC: 32.9 g/dL (ref 31.5–35.7)
MCV: 84 fL (ref 79–97)
MONOS ABS: 0.4 10*3/uL (ref 0.1–0.9)
Monocytes: 9 %
NEUTROS ABS: 3 10*3/uL (ref 1.4–7.0)
NEUTROS PCT: 65 %
PLATELETS: 214 10*3/uL (ref 150–379)
RBC: 4.36 x10E6/uL (ref 4.14–5.80)
RDW: 16.3 % — AB (ref 12.3–15.4)
WBC: 4.7 10*3/uL (ref 3.4–10.8)

## 2016-06-25 LAB — SPECIMEN STATUS REPORT

## 2016-06-26 ENCOUNTER — Ambulatory Visit (INDEPENDENT_AMBULATORY_CARE_PROVIDER_SITE_OTHER): Payer: Self-pay | Admitting: Internal Medicine

## 2016-06-26 ENCOUNTER — Encounter: Payer: Self-pay | Admitting: Internal Medicine

## 2016-06-26 VITALS — BP 118/76 | HR 70 | Resp 12 | Ht 70.5 in | Wt 226.0 lb

## 2016-06-26 DIAGNOSIS — I5022 Chronic systolic (congestive) heart failure: Secondary | ICD-10-CM

## 2016-06-26 DIAGNOSIS — Z79899 Other long term (current) drug therapy: Secondary | ICD-10-CM

## 2016-06-26 DIAGNOSIS — N183 Chronic kidney disease, stage 3 (moderate): Secondary | ICD-10-CM

## 2016-06-26 DIAGNOSIS — E1122 Type 2 diabetes mellitus with diabetic chronic kidney disease: Secondary | ICD-10-CM

## 2016-06-26 LAB — GLUCOSE, POCT (MANUAL RESULT ENTRY): POC GLUCOSE: 112 mg/dL — AB (ref 70–99)

## 2016-06-26 MED ORDER — FUROSEMIDE 40 MG PO TABS: ORAL_TABLET | ORAL | 11 refills | Status: DC

## 2016-06-26 NOTE — Progress Notes (Signed)
   Subjective:    Patient ID: Kyle Burke, male    DOB: 23-Nov-1945, 70 y.o.   MRN: [id number]  HPI  Dilated cardiomyopathy:  Feeling much better.  Down 10 lbs fluid.  Unfortunately, is up 4-5 times nightly to urinate.   Breathing fine.  No chest discomfort  No dizziness.  Energy is good.   Taking 60 mg of Furosemide twice daily, up from 60 mg once daily. HR has limited ability to titrate up on Carvedilol.  CMP showed normal electrolytes and BUN/Crea:  23/1.08 CBC with hemoglobin at 12.1, stable from September.  Current Meds  Medication Sig  . acetaminophen (TYLENOL) 650 MG CR tablet Take 650 mg by mouth every 8 (eight) hours as needed for pain. Reported on 02/14/2016  . carvedilol (COREG) 12.5 MG tablet 1 tab by mouth twice daily  . furosemide (LASIX) 40 MG tablet 1 1/2 tabs by mouth in the morning and 1 tab in the afternoon  . lisinopril (PRINIVIL,ZESTRIL) 40 MG tablet Take 1 tablet (40 mg total) by mouth daily.  . metFORMIN (GLUCOPHAGE) 500 MG tablet Take 1 tablet (500 mg total) by mouth 2 (two) times daily with a meal.  . potassium chloride (K-DUR) 10 MEQ tablet Take 1 tablet (10 mEq total) by mouth daily.  . rivaroxaban (XARELTO) 20 MG TABS tablet Take 1 tablet (20 mg total) by mouth daily with supper.  Furosemide was actually 1 1/2 tabs twice daily at time of visit--changed to above dosing at end of visit.   No Known Allergies  Review of Systems     Objective:   Physical Exam Neck:  No JVD Lungs:  CTA CV:  Irreg, irreg, no murmur or rub.  Radial pulses normal and Equal Low back without edema LE:  No edema of ankles       Assessment & Plan:  Dilated Cardiomyopathy with chronic afib.  Rate controlled.  Compensated failure at this time. Decrease Furosemide with evening dose to 40 mg. Check BMP  Discussed at length with patient and son importance of advanced directives.  Not sure Kyle Burke will pursue this. Discussed Spanish version of [place].

## 2016-06-27 LAB — BASIC METABOLIC PANEL
BUN/Creatinine Ratio: 23 (ref 10–24)
BUN: 27 mg/dL (ref 8–27)
CALCIUM: 9.3 mg/dL (ref 8.6–10.2)
CHLORIDE: 99 mmol/L (ref 96–106)
CO2: 26 mmol/L (ref 18–29)
Creatinine, Ser: 1.17 mg/dL (ref 0.76–1.27)
GFR calc non Af Amer: 63 mL/min/{1.73_m2} (ref >59)
GFR, EST AFRICAN AMERICAN: 73 mL/min/{1.73_m2} (ref >59)
Glucose: 84 mg/dL (ref 65–99)
POTASSIUM: 4.3 mmol/L (ref 3.5–5.2)
SODIUM: 140 mmol/L (ref 134–144)

## 2016-07-13 IMAGING — CT CT ABD-PELV W/ CM
2 of 5 series · 16 of 46 positions shown, 18 images · IV contrast (APPLIED)
Comparison: None

CLINICAL DATA: Ascites of uncertain etiology, shortness of breath
with exertion, 20 pound weight gain, fatigue, type II diabetes
mellitus, essential hypertension, idiopathic cardiomyopathy, CHF,
atrial fibrillation

EXAM:
CT ABDOMEN AND PELVIS WITH CONTRAST
TECHNIQUE: Multidetector CT imaging of the abdomen and pelvis was performed
using the standard protocol following bolus administration of
intravenous contrast. Sagittal and coronal MPR images reconstructed
from axial data set.
CONTRAST:  1 B1CL7V-REE IOPAMIDOL (B1CL7V-REE) INJECTION 61% IV.
Dilute oral contrast.

[Series 4: coronal soft tissue · coronal · 0.85mm/px · 3 of 96 slices shown]
[im 32/96  soft-tissue]
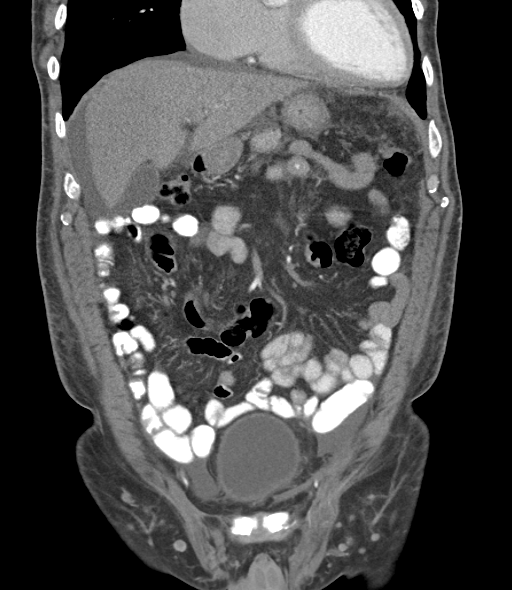
[im 43/96  soft-tissue]
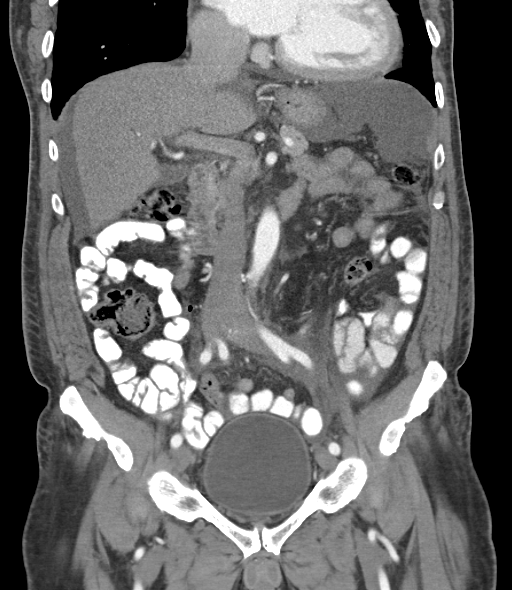
[im 53/96  soft-tissue]
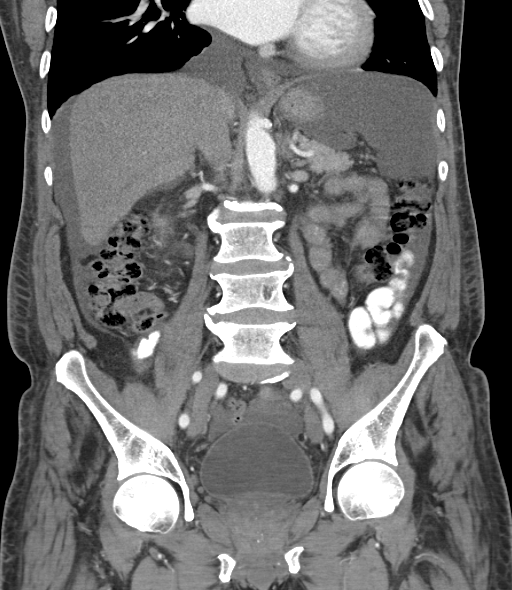

[Series 6: abd/ pelvis 5.0 i30f 1 · axial · 0.93mm/px · z∈[+753,+1203]mm · 13 of 101 slices shown, 15 images]
[im 6/101  soft-tissue]
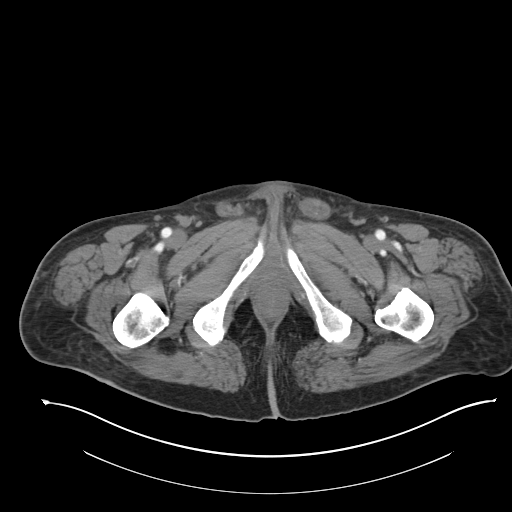
[im 6/101  bone]
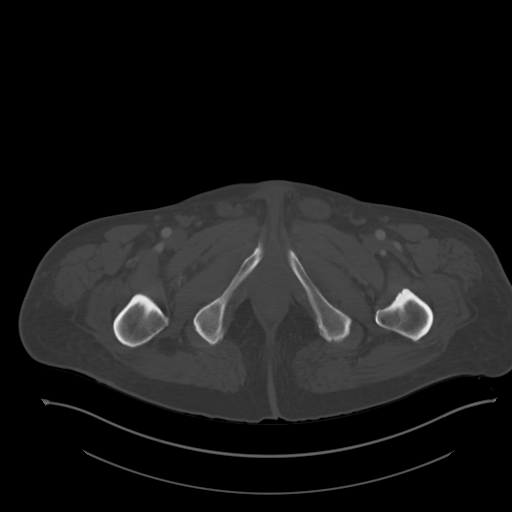
[im 16/101  soft-tissue]
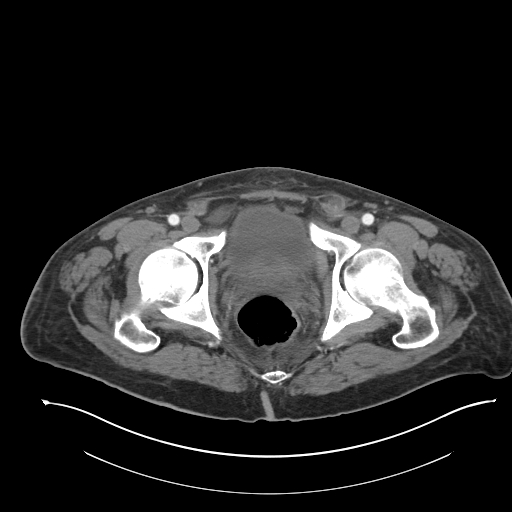
[im 21/101  soft-tissue]
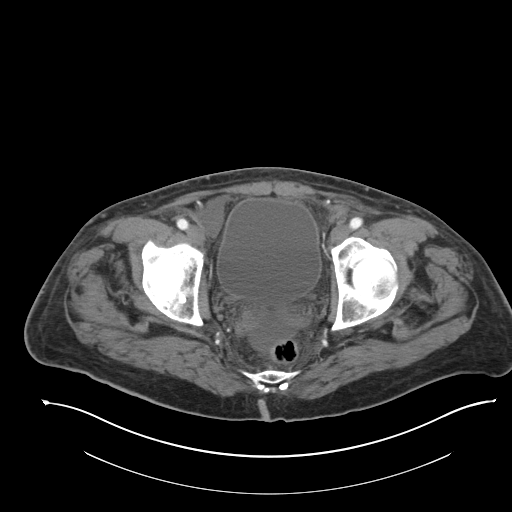
[im 31/101  soft-tissue]
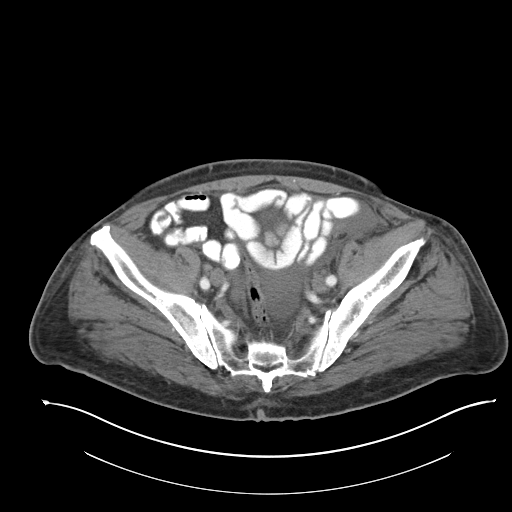
[im 36/101  soft-tissue]
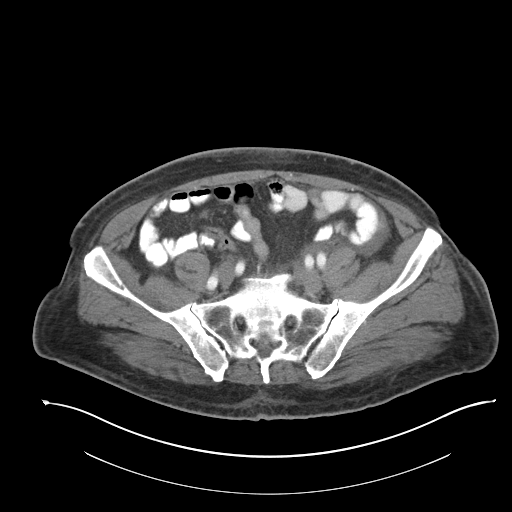
[im 46/101  soft-tissue]
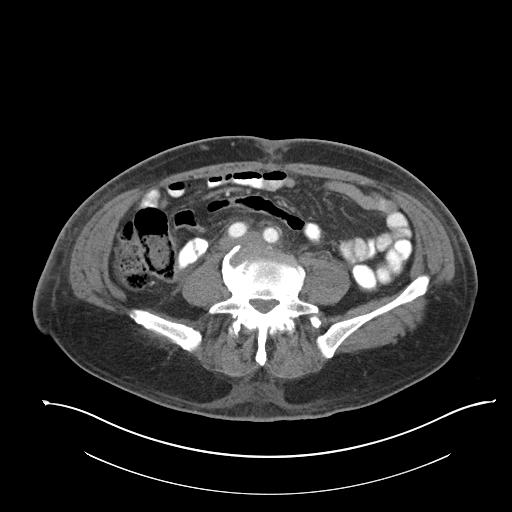
[im 51/101  soft-tissue]
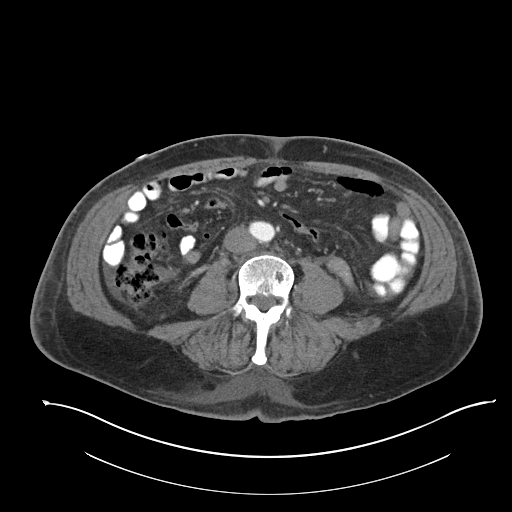
[im 56/101  soft-tissue]
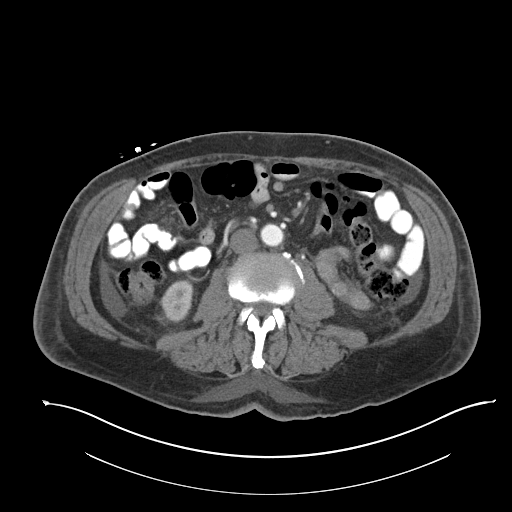
[im 66/101  soft-tissue]
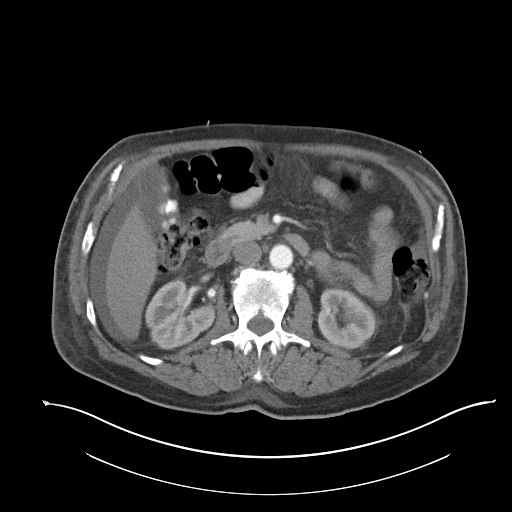
[im 66/101  bone]
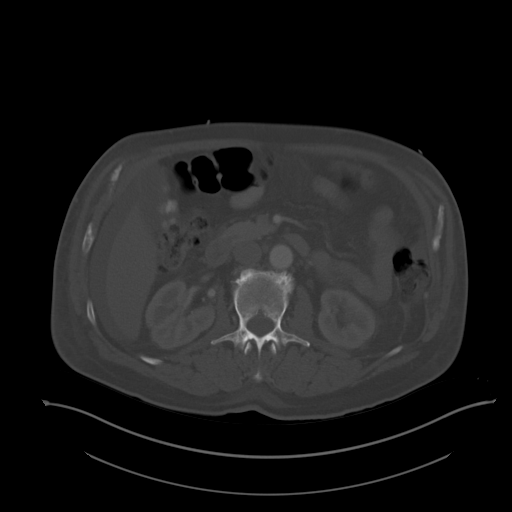
[im 71/101  soft-tissue]
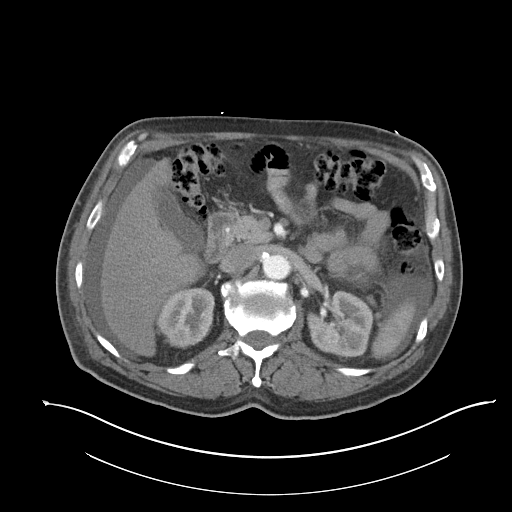
[im 81/101  soft-tissue]
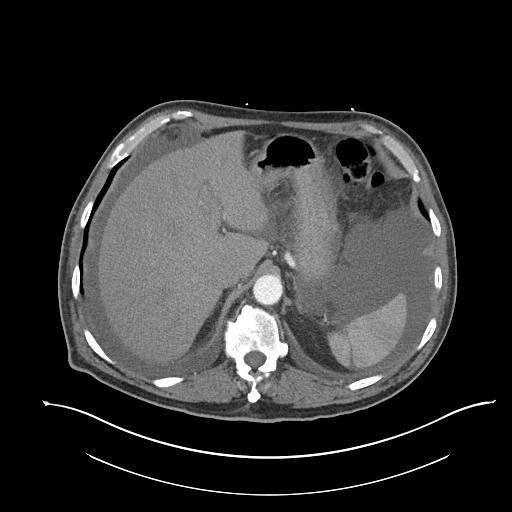
[im 86/101  soft-tissue]
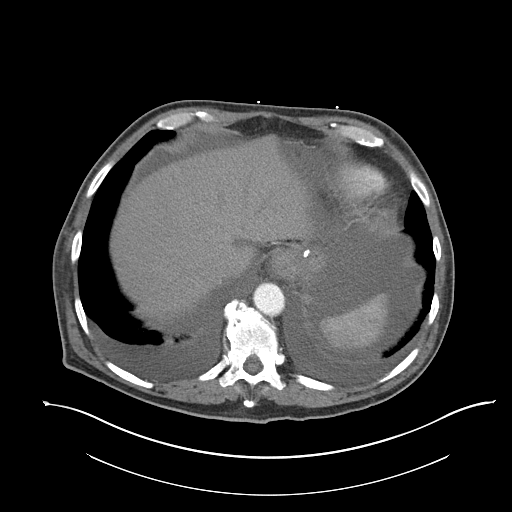
[im 96/101  soft-tissue]
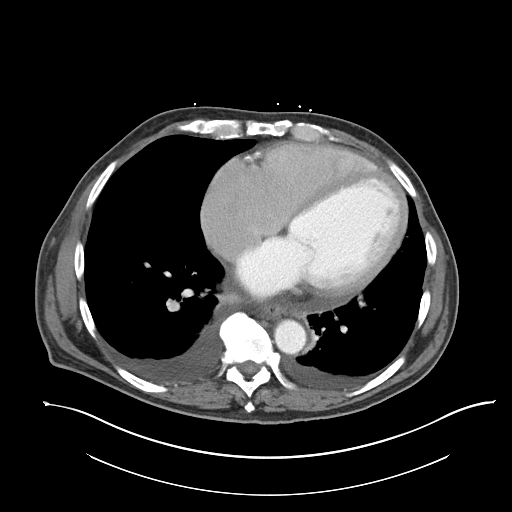

[16 of 46 positions shown; findings below may reference images not displayed]

FINDINGS: Lower chest: Small BILATERAL pleural effusions and minimal adjacent
bibasilar atelectasis.

Hepatobiliary: Liver and gallbladder normal appearance.

Pancreas: Normal appearance

Spleen: Small splenic clefts.  Otherwise normal appearance

Adrenals/Urinary Tract: Mild thickening of adrenal glands without
discrete mass/nodule. Symmetric nephrograms without renal mass,
hydronephrosis or urinary tract calcification. Unremarkable ureters
and bladder. Mild prostatic enlargement.

Stomach/Bowel: Normal appendix. Redundant sigmoid colon. Large and
small bowel loops otherwise normal appearance. Questionable wall
thickening of the distal gastric antrum/pylorus, unable to exclude
gastritis, ulcer disease, or mass.

Vascular/Lymphatic: Coronary arterial calcifications.
Atherosclerotic calcifications at the origins of the renal arteries
and SMA. No aneurysm. No adenopathy.

Reproductive: N/A

Other: Scattered ascites.  No free intraperitoneal air or hernia.

Musculoskeletal: Degenerative disc disease changes thoracolumbar
spine. No acute osseous findings.
IMPRESSION: Ascites of uncertain etiology; liver does not appear cirrhotic.

Questionable wall thickening of the distal gastric antrum/ pylorus,
cannot exclude gastritis, ulcer disease or mass; endoscopic
assessment recommended.

Small bibasilar pleural effusions and atelectasis.

Coronary artery disease.

Mild prostatic enlargement.

## 2016-08-04 ENCOUNTER — Telehealth: Payer: Self-pay | Admitting: Cardiovascular Disease

## 2016-08-04 NOTE — Telephone Encounter (Signed)
New message  Pt's son is having papers/information faxed to [place] from pt's PCP  Please follow up and advise

## 2016-08-05 ENCOUNTER — Encounter: Payer: Self-pay | Admitting: Internal Medicine

## 2016-08-05 ENCOUNTER — Telehealth: Payer: Self-pay | Admitting: Internal Medicine

## 2016-08-05 NOTE — Telephone Encounter (Signed)
Spoke to [place] at [employer] today. [place] has changed the process and now if patients are missing any information or document in the application. It will be denied and there is nothing that can be done. Patient will need to wait six months until they can apply for it again and it will not cover past bills.  In this case pt. Did not have the denial letter from [place] on time. So by the time he had it it was too late and his case was denied . Dul[place] and Shanon Brow have contacted Dealer at [employer]. And they were told that there is nothing they can do about it.

## 2016-08-05 NOTE — Telephone Encounter (Signed)
Patient called stating he has not  been able to get the Financial Assistance at [facility] because they are telling him that he needs to apply for Medicaid and he does not qualify because he is undocumented. [employer] has helped patient with paper work but has not been able to get anything done. Patient states he wants Dr. Amil Amen to give him a referral for cardiologist in [facility] because he was told that in [place] they are very good with the [place] and does not have to go through all the process like here in [place]. Pt, also has been feeling tired and wants to know if he needs to come in to have this evaluated and to get the referral. Please Advise

## 2016-08-05 NOTE — Telephone Encounter (Signed)
To Dr. Mulberry for further direction 

## 2016-08-05 NOTE — Telephone Encounter (Signed)
Please call [facility] and see who worked with him there--would like to get more information as to who they spoke with at [facility] for the assistance and understand better why they keep expecting him to apply for Medicaid.

## 2016-08-12 DIAGNOSIS — I2510 Atherosclerotic heart disease of native coronary artery without angina pectoris: Secondary | ICD-10-CM | POA: Insufficient documentation

## 2016-08-15 ENCOUNTER — Other Ambulatory Visit: Payer: Self-pay | Admitting: Internal Medicine

## 2016-08-15 MED ORDER — GLUCOSE BLOOD VI STRP: ORAL_STRIP | 12 refills | Status: DC

## 2016-08-15 MED ORDER — AGAMATRIX PRESTO PRO METER DEVI: 0 refills | Status: AC

## 2016-08-21 ENCOUNTER — Encounter: Payer: Self-pay | Admitting: Internal Medicine

## 2016-08-21 ENCOUNTER — Ambulatory Visit (INDEPENDENT_AMBULATORY_CARE_PROVIDER_SITE_OTHER): Payer: Self-pay | Admitting: Internal Medicine

## 2016-08-21 VITALS — BP 102/60 | HR 64 | Resp 15 | Ht 71.0 in | Wt 209.0 lb

## 2016-08-21 DIAGNOSIS — N183 Chronic kidney disease, stage 3 unspecified: Secondary | ICD-10-CM

## 2016-08-21 DIAGNOSIS — I5022 Chronic systolic (congestive) heart failure: Secondary | ICD-10-CM

## 2016-08-21 DIAGNOSIS — E1122 Type 2 diabetes mellitus with diabetic chronic kidney disease: Secondary | ICD-10-CM

## 2016-08-21 DIAGNOSIS — Z23 Encounter for immunization: Secondary | ICD-10-CM

## 2016-08-21 DIAGNOSIS — I2510 Atherosclerotic heart disease of native coronary artery without angina pectoris: Secondary | ICD-10-CM

## 2016-08-21 DIAGNOSIS — E785 Hyperlipidemia, unspecified: Secondary | ICD-10-CM

## 2016-08-21 MED ORDER — CARVEDILOL 3.125 MG PO TABS: 3.1250 mg | ORAL_TABLET | Freq: Two times a day (BID) | ORAL | 11 refills | Status: DC

## 2016-08-21 MED ORDER — FUROSEMIDE 40 MG PO TABS: ORAL_TABLET | ORAL | 11 refills | Status: DC

## 2016-08-21 NOTE — Progress Notes (Signed)
   Subjective:    Patient ID: Kyle Burke, male    DOB: 26-Jan-1946, 71 y.o.   MRN: [id number]  HPI  1.  Systolic CHF:  Was seen in ED 08/05/2016 due to dyspnea and weakness and ultimately established with [facility].  Did get a cardiac cath which did show nonobstructive CAD.  Mid LAD was the worst at 50% with mild OM2 disease.  His heart disease and failure is out of proportion to the CAD as previously suspected.   EF on Echo was 20-25% with biventricular failure, severe biatrial enlargement, patent foramen ovale and likely moderate pulmonary hypertension based on increased RV systolic pressure of 69 mmHg.  Due to nonobstructive CAD, started on Atorvastatin 80 mg.  He was diuresed to 210 lbs down from 233 lbs.  Feels much better. He has maintained the weight of 210 consistently since discharge. His Furosemide was increased from 60 mg twice daily to 80 mg twice daily. He is being limited to 48 ounces daily. Appears they are contemplating placing a biventricular pacemaker/?defibrillator.    2.  DM:  Has been chronically well controlled.  Sugars recently have been 98-109.  Did not get his influenza and pneumococcal vaccines in hospital.  Current Meds  Medication Sig  . Blood Glucose Monitoring Suppl (AGAMATRIX PRESTO PRO METER) DEVI Twice daily blood glucose checks before meals  . carvedilol (COREG) 12.5 MG tablet 1 tab by mouth twice daily  . furosemide (LASIX) 40 MG tablet 1 1/2 tabs by mouth in the morning and 1 tab in the afternoon  . glucose blood (AGAMATRIX PRESTO TEST) test strip Twice daily glucose checks before meals  . lisinopril (PRINIVIL,ZESTRIL) 40 MG tablet Take 1 tablet (40 mg total) by mouth daily.  . metFORMIN (GLUCOPHAGE) 500 MG tablet Take 1 tablet (500 mg total) by mouth 2 (two) times daily with a meal.  . potassium chloride (K-DUR) 10 MEQ tablet Take 1 tablet (10 mEq total) by mouth daily.  . rivaroxaban (XARELTO) 20 MG TABS tablet Take 1 tablet (20 mg total) by  mouth daily with supper.  Atorvastatin 80 mg once daily  No Known Allergies          Review of Systems     Objective:   Physical Exam FAce quite thin, but otherwise appears much improved Lungs:  CTA CV:  Irregularly irregular.  No murmur or rub, radial and DP pulses normal and equal Abd:  S, NT, No HSM or mass, + BS LE:  No edema       Assessment & Plan:  1.  Systolic CHF:  As per [facility], Dr. Philbert Riser.  Maintain for now at current Furosemide dosing as weight appears stable.  BMP today.  2. DM: controlled.  Pneumovax today.  We do not have influenza vaccine.  Discussed he missed flu vaccine clinics that were gratis in September and October.  Encouraged to still obtain and to call in September for clinics yearly  3.  Nonobstructive CAD:  Now on Atorvastatin.  Assuming will be checked at his follow up with Cardiology.  4.  Dyslipidemia:  As above.  Discussed with him that he does have some CAD, though not likely the cause of his CHF.  Discussed this is why he is now on Atorvastatin, though HDL was the only really problematic number in the past.

## 2016-08-22 LAB — BASIC METABOLIC PANEL
BUN / CREAT RATIO: 29 — AB (ref 10–24)
BUN: 30 mg/dL — ABNORMAL HIGH (ref 8–27)
CO2: 24 mmol/L (ref 18–29)
CREATININE: 1.04 mg/dL (ref 0.76–1.27)
Calcium: 9.9 mg/dL (ref 8.6–10.2)
Chloride: 99 mmol/L (ref 96–106)
GFR, EST AFRICAN AMERICAN: 84 mL/min/{1.73_m2} (ref >59)
GFR, EST NON AFRICAN AMERICAN: 73 mL/min/{1.73_m2} (ref >59)
Glucose: 84 mg/dL (ref 65–99)
Potassium: 4.2 mmol/L (ref 3.5–5.2)
SODIUM: 140 mmol/L (ref 134–144)

## 2016-08-29 ENCOUNTER — Telehealth: Payer: Self-pay | Admitting: Internal Medicine

## 2016-09-01 NOTE — Telephone Encounter (Signed)
Spoke with Basilio Cairo on 08/29/2016 regarding this phone call.  To hold Aspirin over the weekend and give [place] an update on Tuesday when clinic open again.  Al[

## 2016-09-02 NOTE — Telephone Encounter (Signed)
Patient called back today and states he started using the saline for the nose since Saturday and stopped taking the Aspirin too and the nose bleeding problem has resolved.  Patient wants to know if he needs to start taking the Aspirin again. Please advise

## 2016-09-02 NOTE — Telephone Encounter (Signed)
To Dr. Mulberry for further direction 

## 2016-09-05 NOTE — Telephone Encounter (Signed)
Please ask him to restart the aspirin 81 mg and continue the nasal spray.  To call if bleeding starts up again

## 2016-09-25 NOTE — Progress Notes (Signed)
Patient was notified of lab results and had an appointment with cardiologist yesterday 09/24/16 and was told he was doing well.

## 2016-10-06 ENCOUNTER — Telehealth: Payer: Self-pay | Admitting: Internal Medicine

## 2016-10-06 NOTE — Telephone Encounter (Signed)
Patient called for refill of lisinopril (Prinivil,zestril) 40 mg tablet.

## 2016-10-07 ENCOUNTER — Other Ambulatory Visit: Payer: Self-pay

## 2016-10-07 DIAGNOSIS — I10 Essential (primary) hypertension: Secondary | ICD-10-CM

## 2016-10-07 MED ORDER — LISINOPRIL 40 MG PO TABS: 40.0000 mg | ORAL_TABLET | Freq: Every day | ORAL | 11 refills | Status: DC

## 2016-10-07 NOTE — Telephone Encounter (Signed)
Rx sent to [facility]

## 2016-10-15 ENCOUNTER — Encounter: Payer: Self-pay | Admitting: Internal Medicine

## 2016-10-15 ENCOUNTER — Ambulatory Visit (INDEPENDENT_AMBULATORY_CARE_PROVIDER_SITE_OTHER): Payer: Self-pay | Admitting: Internal Medicine

## 2016-10-15 VITALS — BP 110/62 | HR 62 | Resp 12 | Ht 71.5 in | Wt 211.0 lb

## 2016-10-15 DIAGNOSIS — R040 Epistaxis: Secondary | ICD-10-CM

## 2016-10-15 NOTE — Patient Instructions (Signed)
Cool mist humidifier in room at night KY jelly to be applied and worked up in right nostril as often as needed.  Do not blow or clean your nose--leave it alone.

## 2016-10-15 NOTE — Progress Notes (Signed)
   Subjective:    Patient ID: Kyle Burke, male    DOB: [birth year], 71 y.o.   MRN: [id number]  HPI   Having nosebleeds every morning about 1 hour after getting up.  States only small amount drips up his nose.  He holds external pressure for a minute and stops.  Also, states he feels there is something in his nose, blows his nose and then his nose starts to bleed. His daughter in law has a cool mist humidifier, but he does not have it going in his bedroom yet. Does have heat on his house.  Also, cut his carvedilol in half as his blood pressure was running 85/55.  [facility] cardiology increased his dose to 6.25 mg twice daily and he has taken it back to previous dose.  Current Meds  Medication Sig  . aspirin EC 81 MG tablet Take 81 mg by mouth daily.  Marland Kitchen atorvastatin (LIPITOR) 80 MG tablet Take 80 mg by mouth daily.  . Blood Glucose Monitoring Suppl (AGAMATRIX PRESTO PRO METER) DEVI Twice daily blood glucose checks before meals  . carvedilol (COREG) 3.125 MG tablet Take 1 tablet (3.125 mg total) by mouth 2 (two) times daily with a meal. (Patient taking differently: Take 6.25 mg by mouth 2 (two) times daily with a meal. )  . furosemide (LASIX) 40 MG tablet 2 tabs by mouth twice daily  . glucose blood (AGAMATRIX PRESTO TEST) test strip Twice daily glucose checks before meals  . lisinopril (PRINIVIL,ZESTRIL) 40 MG tablet Take 1 tablet (40 mg total) by mouth daily.  . metFORMIN (GLUCOPHAGE) 500 MG tablet Take 1 tablet (500 mg total) by mouth 2 (two) times daily with a meal.  . potassium chloride (K-DUR) 10 MEQ tablet Take 1 tablet (10 mEq total) by mouth daily.  . rivaroxaban (XARELTO) 20 MG TABS tablet Take 1 tablet (20 mg total) by mouth daily with supper.   No Known Allergies    Review of Systems     Objective:   Physical Exam  NAD HEENT:  PERRL, EOMI,mild irritation on nasal septal wall of right nostril.  No active bleeding.  Throat without injection Neck:  Supple,  No  adenopathy Chest:  CTA CV:  Irregularly irregular, radial pulses normal and equal LE:  Trace edema      Assessment & Plan:  Epistaxis:  Cool mist humidifier in room at night.   Water based gel to nostril for moisture. Avoid blowing or manipulating nostril. Continue Xarelto and ASA for now. Follow up in 3 months.

## 2016-10-24 ENCOUNTER — Telehealth: Payer: Self-pay | Admitting: Internal Medicine

## 2016-10-24 ENCOUNTER — Telehealth: Payer: Self-pay | Admitting: *Deleted

## 2016-10-24 NOTE — Telephone Encounter (Signed)
Patient has one more refill of Xarelto and needs more medication. He is requesting a new prescription refill.

## 2016-10-24 NOTE — Telephone Encounter (Signed)
Patient would like more blood glucose test [facility]

## 2016-10-24 NOTE — Telephone Encounter (Signed)
Spoke with pt he states he has been taking Xarelto 20 mg once daily. Pt has a orange assistance card which he presented at the [place]. [facility] sent a fax saying that pt's insurance does not cover the medication. When spoken with pt, he states that he does not have insurance. Pt is aware that he may not be able to afford the medication from [place]. Pt states that he has been going to an assistance program office and he applied for assistance for this  medication about 15 days ago . Pt has not heard from them, but is planning to checked on it. Pt only has a Xarelto pill for today. A 20 mg Xarelto/14 pills placed at the front desk for pt to peak up. Pt is aware and will come this afternoon prior 5:00 pm.

## 2016-10-24 NOTE — Telephone Encounter (Signed)
Received fax from [facility] on [address] stating that the xarelto is not covered. Pharmacy requested a medication change. There was also a note on the fax that says pt also has orange card, can you send to clinic? Please advise. Thanks, MI

## 2016-10-27 ENCOUNTER — Other Ambulatory Visit: Payer: Self-pay | Admitting: Internal Medicine

## 2016-10-27 DIAGNOSIS — I482 Chronic atrial fibrillation, unspecified: Secondary | ICD-10-CM

## 2016-10-27 MED ORDER — RIVAROXABAN 20 MG PO TABS: 20.0000 mg | ORAL_TABLET | Freq: Every day | ORAL | 11 refills | Status: DC

## 2016-10-27 NOTE — Telephone Encounter (Signed)
He should still have refills on test strips.  He has to purchase the lancets on his own--[facility] does not have those. They are not expensive.

## 2016-10-27 NOTE — Telephone Encounter (Signed)
Please call patient and clarify if he needs test strips or lancets because he has refills on his test strips.

## 2016-10-27 NOTE — Telephone Encounter (Signed)
I have a call into MAP at [facility] regarding Xarelto and getting him signed back up.

## 2016-10-28 NOTE — Telephone Encounter (Signed)
Patient states he gets  Xarelto with the voucher at [facility] on [address].

## 2016-10-28 NOTE — Telephone Encounter (Signed)
Noted. To Estefania to notify patient

## 2016-10-29 ENCOUNTER — Other Ambulatory Visit: Payer: Self-pay | Admitting: Internal Medicine

## 2016-10-29 DIAGNOSIS — I482 Chronic atrial fibrillation, unspecified: Secondary | ICD-10-CM

## 2016-10-29 MED ORDER — RIVAROXABAN 20 MG PO TABS: 20.0000 mg | ORAL_TABLET | Freq: Every day | ORAL | 11 refills | Status: DC

## 2016-10-31 NOTE — Telephone Encounter (Signed)
Patient notified

## 2016-11-13 ENCOUNTER — Encounter: Payer: Self-pay | Admitting: Internal Medicine

## 2016-11-20 ENCOUNTER — Ambulatory Visit: Payer: Self-pay | Admitting: Internal Medicine

## 2016-11-24 ENCOUNTER — Telehealth: Payer: Self-pay | Admitting: Internal Medicine

## 2016-11-24 NOTE — Telephone Encounter (Signed)
Patient came in stating he went to the [place] to pick up his carvedilol (COREG) 3.125 MG tablet [[id number]] and he was told he needs to call his PCP to authorize refills. Cherice called and authorized refills for patient since the pharmacy could not find the refills that were sent on 08/21/16. Patient to pick up medications today  Patient states he is currently taking the carvedilol (COREG) 3.125 MG tablet two times a day and he is no longer taking the carvedilol (COREG)6.25 MG tablet because it drops down his blood pressure.

## 2016-12-05 ENCOUNTER — Telehealth: Payer: Self-pay | Admitting: Internal Medicine

## 2016-12-05 NOTE — Telephone Encounter (Signed)
Patient came in stating he now has Medicaid and would like for Dr. Amil Amen to refer him to [facility]. Medicaid copy received.

## 2016-12-08 NOTE — Telephone Encounter (Signed)
To Dr. Mulberry for approval 

## 2016-12-08 NOTE — Telephone Encounter (Signed)
Need to know why?

## 2016-12-10 NOTE — Telephone Encounter (Signed)
Pain on both knees and calfs when going up and down stairs

## 2016-12-23 NOTE — Telephone Encounter (Signed)
Needs an appt. With me first

## 2016-12-24 ENCOUNTER — Other Ambulatory Visit: Payer: Self-pay

## 2016-12-24 MED ORDER — POTASSIUM CHLORIDE ER 10 MEQ PO TBCR: 10.0000 meq | EXTENDED_RELEASE_TABLET | Freq: Every day | ORAL | 11 refills | Status: DC

## 2016-12-24 NOTE — Telephone Encounter (Signed)
To Estefania to notify patient

## 2016-12-24 NOTE — Telephone Encounter (Signed)
Patient informed and will discuss with Dr. Amil Amen on follow up appointment 01/08/17 @ 4:00 pm

## 2017-01-07 NOTE — Telephone Encounter (Signed)
Patient will discuss with Dr. Amil Amen on FU appointment 01/08/17

## 2017-01-08 ENCOUNTER — Ambulatory Visit: Payer: Self-pay | Admitting: Internal Medicine

## 2017-01-08 ENCOUNTER — Ambulatory Visit (INDEPENDENT_AMBULATORY_CARE_PROVIDER_SITE_OTHER): Payer: Self-pay | Admitting: Internal Medicine

## 2017-01-08 ENCOUNTER — Encounter: Payer: Self-pay | Admitting: Internal Medicine

## 2017-01-08 VITALS — BP 126/84 | HR 70 | Resp 12 | Ht 71.0 in | Wt 215.0 lb

## 2017-01-08 DIAGNOSIS — E1122 Type 2 diabetes mellitus with diabetic chronic kidney disease: Secondary | ICD-10-CM

## 2017-01-08 DIAGNOSIS — M179 Osteoarthritis of knee, unspecified: Secondary | ICD-10-CM | POA: Insufficient documentation

## 2017-01-08 DIAGNOSIS — E785 Hyperlipidemia, unspecified: Secondary | ICD-10-CM

## 2017-01-08 DIAGNOSIS — M170 Bilateral primary osteoarthritis of knee: Secondary | ICD-10-CM

## 2017-01-08 DIAGNOSIS — I5022 Chronic systolic (congestive) heart failure: Secondary | ICD-10-CM

## 2017-01-08 DIAGNOSIS — M1710 Unilateral primary osteoarthritis, unspecified knee: Secondary | ICD-10-CM

## 2017-01-08 DIAGNOSIS — N183 Chronic kidney disease, stage 3 (moderate): Secondary | ICD-10-CM

## 2017-01-08 HISTORY — DX: Osteoarthritis of knee, unspecified: M17.9

## 2017-01-08 HISTORY — DX: Unilateral primary osteoarthritis, unspecified knee: M17.10

## 2017-01-08 LAB — GLUCOSE, POCT (MANUAL RESULT ENTRY): POC Glucose: 177 mg/dl — AB (ref 70–99)

## 2017-01-08 MED ORDER — MENTHOL-CAMPHOR 3-3 % EX GEL: CUTANEOUS | 0 refills | Status: DC

## 2017-01-08 NOTE — Patient Instructions (Signed)
Try rub on knees with camphor and menthol

## 2017-01-08 NOTE — Progress Notes (Signed)
   Subjective:    Patient ID: Kyle Burke, male    DOB: [birth year], 71 y.o.   MRN: [id number]  HPI   1.  DM:  Has not had A1C checked in some time.  Has always been under 7.0%.  When checks sugars in the morning, always low 100s.  Cholesterol in past has been basically at goal with Atorvastatin.   He is nonfasting today.  Did have his eye check about 1 year ago, but cannot remember the date.   Checks feet nightly.     2.  Cardiomyopathy:  Saw EP, Dr. Eldred Manges,  at [facility].  Plans for CRT-D placement once financial assistance in place. Aspirin stopped by cardiology due to nose bleeds.   3.  Bilateral knee pain/OA:  States he is fine when he is walking around, but when he sits down, he has pain.  He does take Xarelto and would want him off that for a knee injection.  He is not really a candidate for surgery.  Current Meds  Medication Sig  . atorvastatin (LIPITOR) 80 MG tablet Take 80 mg by mouth daily.  . Blood Glucose Monitoring Suppl (AGAMATRIX PRESTO PRO METER) DEVI Twice daily blood glucose checks before meals  . carvedilol (COREG) 3.125 MG tablet Take 1 tablet (3.125 mg total) by mouth 2 (two) times daily with a meal.  . furosemide (LASIX) 40 MG tablet 2 tabs by mouth twice daily  . glucose blood (AGAMATRIX PRESTO TEST) test strip Twice daily glucose checks before meals  . lisinopril (PRINIVIL,ZESTRIL) 40 MG tablet Take 1 tablet (40 mg total) by mouth daily. (Patient taking differently: Take 40 mg by mouth. 1/2 tablet daily)  . metFORMIN (GLUCOPHAGE) 500 MG tablet Take 1 tablet (500 mg total) by mouth 2 (two) times daily with a meal.  . potassium chloride (K-DUR) 10 MEQ tablet Take 1 tablet (10 mEq total) by mouth daily.  . rivaroxaban (XARELTO) 20 MG TABS tablet Take 1 tablet (20 mg total) by mouth daily with supper.    No Known Allergies      Review of Systems     Objective:   Physical Exam  NAD Bilateral Knees:  Significant hypertrophic changes to bilateral  knees.  Full ROM, No joint line tenderness.  No pain or laxity with stress maneuvers for cruciates and collateral ligaments.  No pain with compression of patella against anterior joint. No effusion.  No erythema.         Assessment & Plan:  1.  DM:  Has been well controlled for years.  Check A1C, urine microalbmin/crea, CMP, CBC in 1 week.  2.  Dyslipidemia:  FLP in 1 week.  3.  OA of knees:  Not a good surgical candidate, but perhaps when he is off the Xarelto, can also get him in for knee injections to help control pain.  Referral to ortho at [facility] to see whether that can be worked out.  4.  Cardiomyopathy:  Plan for placement of CTR-D once financial assistance in place at [facility].

## 2017-01-15 ENCOUNTER — Other Ambulatory Visit (INDEPENDENT_AMBULATORY_CARE_PROVIDER_SITE_OTHER): Payer: Self-pay

## 2017-01-15 DIAGNOSIS — N183 Chronic kidney disease, stage 3 (moderate): Principal | ICD-10-CM

## 2017-01-15 DIAGNOSIS — E785 Hyperlipidemia, unspecified: Secondary | ICD-10-CM

## 2017-01-15 DIAGNOSIS — E1122 Type 2 diabetes mellitus with diabetic chronic kidney disease: Secondary | ICD-10-CM

## 2017-01-15 DIAGNOSIS — Z79899 Other long term (current) drug therapy: Secondary | ICD-10-CM

## 2017-01-16 LAB — CBC WITH DIFFERENTIAL/PLATELET
Basophils Absolute: 0 10*3/uL (ref 0.0–0.2)
Basos: 1 %
EOS (ABSOLUTE): 0.2 10*3/uL (ref 0.0–0.4)
EOS: 4 %
HEMATOCRIT: 36.7 % — AB (ref 37.5–51.0)
HEMOGLOBIN: 11.9 g/dL — AB (ref 13.0–17.7)
IMMATURE GRANS (ABS): 0 10*3/uL (ref 0.0–0.1)
IMMATURE GRANULOCYTES: 0 %
LYMPHS: 31 %
Lymphocytes Absolute: 1.3 10*3/uL (ref 0.7–3.1)
MCH: 30.5 pg (ref 26.6–33.0)
MCHC: 32.4 g/dL (ref 31.5–35.7)
MCV: 94 fL (ref 79–97)
MONOCYTES: 11 %
Monocytes Absolute: 0.5 10*3/uL (ref 0.1–0.9)
Neutrophils Absolute: 2.3 10*3/uL (ref 1.4–7.0)
Neutrophils: 53 %
Platelets: 170 10*3/uL (ref 150–379)
RBC: 3.9 x10E6/uL — AB (ref 4.14–5.80)
RDW: 13.8 % (ref 12.3–15.4)
WBC: 4.3 10*3/uL (ref 3.4–10.8)

## 2017-01-16 LAB — COMPREHENSIVE METABOLIC PANEL
ALBUMIN: 4.2 g/dL (ref 3.6–4.8)
ALK PHOS: 153 IU/L — AB (ref 39–117)
ALT: 24 IU/L (ref 0–44)
AST: 26 IU/L (ref 0–40)
Albumin/Globulin Ratio: 1.4 (ref 1.2–2.2)
BUN / CREAT RATIO: 24 (ref 10–24)
BUN: 23 mg/dL (ref 8–27)
Bilirubin Total: 1.4 mg/dL — ABNORMAL HIGH (ref 0.0–1.2)
CO2: 25 mmol/L (ref 18–29)
CREATININE: 0.97 mg/dL (ref 0.76–1.27)
Calcium: 9.2 mg/dL (ref 8.6–10.2)
Chloride: 104 mmol/L (ref 96–106)
GFR calc non Af Amer: 79 mL/min/{1.73_m2} (ref >59)
GFR, EST AFRICAN AMERICAN: 92 mL/min/{1.73_m2} (ref >59)
Globulin, Total: 3 g/dL (ref 1.5–4.5)
Glucose: 83 mg/dL (ref 65–99)
Potassium: 4.2 mmol/L (ref 3.5–5.2)
Sodium: 142 mmol/L (ref 134–144)
TOTAL PROTEIN: 7.2 g/dL (ref 6.0–8.5)

## 2017-01-16 LAB — LIPID PANEL W/O CHOL/HDL RATIO
Cholesterol, Total: 100 mg/dL (ref 100–199)
HDL: 45 mg/dL (ref >39)
LDL CALC: 47 mg/dL (ref 0–99)
TRIGLYCERIDES: 38 mg/dL (ref 0–149)
VLDL CHOLESTEROL CAL: 8 mg/dL (ref 5–40)

## 2017-01-16 LAB — MICROALBUMIN / CREATININE URINE RATIO
Creatinine, Urine: 90 mg/dL
Microalb/Creat Ratio: 118.2 mg/g creat — ABNORMAL HIGH (ref 0.0–30.0)
Microalbumin, Urine: 106.4 ug/mL

## 2017-01-16 LAB — HGB A1C W/O EAG: Hgb A1c MFr Bld: 6 % — ABNORMAL HIGH (ref 4.8–5.6)

## 2017-02-09 MED ORDER — FERROUS GLUCONATE 324 (38 FE) MG PO TABS: 324.0000 mg | ORAL_TABLET | Freq: Every day | ORAL | 3 refills | Status: AC

## 2017-02-16 ENCOUNTER — Other Ambulatory Visit: Payer: Self-pay

## 2017-02-16 ENCOUNTER — Telehealth: Payer: Self-pay | Admitting: Internal Medicine

## 2017-02-16 MED ORDER — METFORMIN HCL 500 MG PO TABS: 500.0000 mg | ORAL_TABLET | Freq: Two times a day (BID) | ORAL | 11 refills | Status: DC

## 2017-02-16 NOTE — Telephone Encounter (Signed)
Patient needs refill of metFORMIN (GLUCOPHAGE) 500 mg. Tablet.

## 2017-02-16 NOTE — Telephone Encounter (Signed)
Rx faxed to pharmacy  

## 2017-05-14 ENCOUNTER — Ambulatory Visit (INDEPENDENT_AMBULATORY_CARE_PROVIDER_SITE_OTHER): Payer: Self-pay | Admitting: Internal Medicine

## 2017-05-14 ENCOUNTER — Encounter: Payer: Self-pay | Admitting: Internal Medicine

## 2017-05-14 VITALS — BP 124/82 | HR 80 | Resp 14 | Ht 71.0 in | Wt 226.0 lb

## 2017-05-14 DIAGNOSIS — I10 Essential (primary) hypertension: Secondary | ICD-10-CM

## 2017-05-14 DIAGNOSIS — N183 Chronic kidney disease, stage 3 unspecified: Secondary | ICD-10-CM

## 2017-05-14 DIAGNOSIS — E1122 Type 2 diabetes mellitus with diabetic chronic kidney disease: Secondary | ICD-10-CM

## 2017-05-14 DIAGNOSIS — I5022 Chronic systolic (congestive) heart failure: Secondary | ICD-10-CM

## 2017-05-14 DIAGNOSIS — E1129 Type 2 diabetes mellitus with other diabetic kidney complication: Secondary | ICD-10-CM

## 2017-05-14 DIAGNOSIS — D649 Anemia, unspecified: Secondary | ICD-10-CM

## 2017-05-14 DIAGNOSIS — Z79899 Other long term (current) drug therapy: Secondary | ICD-10-CM

## 2017-05-14 DIAGNOSIS — I482 Chronic atrial fibrillation, unspecified: Secondary | ICD-10-CM

## 2017-05-14 DIAGNOSIS — R809 Proteinuria, unspecified: Secondary | ICD-10-CM

## 2017-05-14 NOTE — Patient Instructions (Signed)
Free flu vaccine at flu vaccine clinics:  September 20 8:30 to 11:30 a.m. And September 29th [facility] 1-4 p.m.  Both at [facility] next door to clinic  

## 2017-05-14 NOTE — Progress Notes (Signed)
Subjective:    Patient ID: Kyle Burke, male    DOB: [birth year], 71 y.o.   MRN: [id number]  HPI 1.  Cardiomyopathy/systolic CHF:  Gets tired when walks for 10 minutes or more.  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 When walks a lot, his ankles do not swell. When he is less active walking, gets mild swelling.  Taking only 20 mg of Lisinopril per Cardiology.  Has an appointment with cardiology on January 3rd. No chest pain No palpitations. Limiting sodium Weighs regularly.  Discussed has gained 11 lbs since May  2.  DM:  Sugars running 110 and below.  Nothing below 70.  Needs foot exam, flu vaccine.   Has not had eye check this year.  Has microalbuminuria--needs rechecked   3.  ED:  Interested in medication to improve erection.  Discussed my concern should he need NTG and a drop in BP with the combination.  Particularly with the poor function of his heart.  4.  Dyslipidemia:  At goal in May.  Current Meds  Medication Sig  . acetaminophen (TYLENOL) 650 MG CR tablet Take 650 mg by mouth every 8 (eight) hours as needed for pain. Reported on 02/14/2016  . atorvastatin (LIPITOR) 80 MG tablet Take 80 mg by mouth daily.  . Blood Glucose Monitoring Suppl (AGAMATRIX PRESTO PRO METER) DEVI Twice daily blood glucose checks before meals  . carvedilol (COREG) 3.125 MG tablet Take 1 tablet (3.125 mg total) by mouth 2 (two) times daily with a meal.  . ferrous gluconate (FERGON) 324 MG tablet Take 1 tablet (324 mg total) by mouth daily with breakfast.  . furosemide (LASIX) 40 MG tablet 2 tabs by mouth twice daily  . glucose blood (AGAMATRIX PRESTO TEST) test strip Twice daily glucose checks before meals  . lisinopril (PRINIVIL,ZESTRIL) 40 MG tablet Take 1 tablet (40 mg total) by mouth daily. (Patient taking differently: Take 40 mg by mouth. 1/2 tablet daily)  . Menthol-Camphor 3-3 % GEL Apply to knees as needed for pain  . metFORMIN (GLUCOPHAGE) 500 MG tablet Take 1  tablet (500 mg total) by mouth 2 (two) times daily with a meal.  . potassium chloride (K-DUR) 10 MEQ tablet Take 1 tablet (10 mEq total) by mouth daily.  . rivaroxaban (XARELTO) 20 MG TABS tablet Take 1 tablet (20 mg total) by mouth daily with supper.    No Known Allergies    Review of Systems     Objective:   Physical Exam  NAD Neck:  No JVD Lungs:  CTA CV:  Irregularly irregular, Radial pulses normal and equal. DP pulses a bit decreased, but equal LE:  Trace edema in left ankle.  Diabetic Foot Exam - Simple   Simple Foot Form Diabetic Foot exam was performed with the following findings:  Yes 05/14/2017  5:06 PM  Visual Inspection See comments:  Yes Sensation Testing Intact to touch and monofilament testing bilaterally:  Yes Pulse Check See comments:  Yes Comments Prominent superficial broken veins all overfeet.  Slightly cyanotic appearance to distal toes.  Decreased DP and PT pulses         Assessment & Plan:  1.  CHF/cardiomyopathy:  Stable.  To watch weight gain.  2.  ED:  Asked patient to bring this up with cardiology.  Concerned he in particular would not tolerate a drop in bp suddenly with poor EF, should he need NTG and using ED drug, however, certainly need to consider quality of life.  3.  DM:  Has been well controlled.   Flu vaccine at health fair Saturday Urine microalbumin/crea A1C, CMP Send for yearly eye exam  4.  Essential Hypertension:  Controlled.   5.  Anemia:  CBC.  Follow up in 4 months.

## 2017-05-15 LAB — MICROALBUMIN / CREATININE URINE RATIO
Creatinine, Urine: 79.3 mg/dL
MICROALB/CREAT RATIO: 170.6 mg/g{creat} — AB (ref 0.0–30.0)
MICROALBUM., U, RANDOM: 135.3 ug/mL

## 2017-05-15 LAB — CBC WITH DIFFERENTIAL/PLATELET
BASOS ABS: 0 10*3/uL (ref 0.0–0.2)
Basos: 1 %
EOS (ABSOLUTE): 0.1 10*3/uL (ref 0.0–0.4)
Eos: 3 %
HEMOGLOBIN: 12.2 g/dL — AB (ref 13.0–17.7)
Hematocrit: 37 % — ABNORMAL LOW (ref 37.5–51.0)
Immature Grans (Abs): 0 10*3/uL (ref 0.0–0.1)
Immature Granulocytes: 0 %
LYMPHS ABS: 1.4 10*3/uL (ref 0.7–3.1)
Lymphs: 33 %
MCH: 28.8 pg (ref 26.6–33.0)
MCHC: 33 g/dL (ref 31.5–35.7)
MCV: 87 fL (ref 79–97)
MONOS ABS: 0.5 10*3/uL (ref 0.1–0.9)
Monocytes: 11 %
NEUTROS ABS: 2.2 10*3/uL (ref 1.4–7.0)
Neutrophils: 52 %
PLATELETS: 142 10*3/uL — AB (ref 150–379)
RBC: 4.24 x10E6/uL (ref 4.14–5.80)
RDW: 18.5 % — ABNORMAL HIGH (ref 12.3–15.4)
WBC: 4.3 10*3/uL (ref 3.4–10.8)

## 2017-05-15 LAB — COMPREHENSIVE METABOLIC PANEL
ALBUMIN: 4.4 g/dL (ref 3.5–4.8)
ALK PHOS: 184 IU/L — AB (ref 39–117)
ALT: 17 IU/L (ref 0–44)
AST: 24 IU/L (ref 0–40)
Albumin/Globulin Ratio: 1.5 (ref 1.2–2.2)
BILIRUBIN TOTAL: 1.9 mg/dL — AB (ref 0.0–1.2)
BUN / CREAT RATIO: 20 (ref 10–24)
BUN: 27 mg/dL (ref 8–27)
CHLORIDE: 101 mmol/L (ref 96–106)
CO2: 25 mmol/L (ref 20–29)
Calcium: 9.2 mg/dL (ref 8.6–10.2)
Creatinine, Ser: 1.32 mg/dL — ABNORMAL HIGH (ref 0.76–1.27)
GFR calc Af Amer: 63 mL/min/{1.73_m2} (ref >59)
GFR calc non Af Amer: 54 mL/min/{1.73_m2} — ABNORMAL LOW (ref >59)
GLUCOSE: 88 mg/dL (ref 65–99)
Globulin, Total: 2.9 g/dL (ref 1.5–4.5)
Potassium: 4.4 mmol/L (ref 3.5–5.2)
Sodium: 143 mmol/L (ref 134–144)
Total Protein: 7.3 g/dL (ref 6.0–8.5)

## 2017-05-15 LAB — HGB A1C W/O EAG: HEMOGLOBIN A1C: 6.2 % — AB (ref 4.8–5.6)

## 2017-06-03 ENCOUNTER — Telehealth: Payer: Self-pay

## 2017-06-03 ENCOUNTER — Other Ambulatory Visit (INDEPENDENT_AMBULATORY_CARE_PROVIDER_SITE_OTHER): Payer: Self-pay

## 2017-06-03 DIAGNOSIS — R748 Abnormal levels of other serum enzymes: Secondary | ICD-10-CM

## 2017-06-03 DIAGNOSIS — R5383 Other fatigue: Secondary | ICD-10-CM

## 2017-06-03 DIAGNOSIS — R17 Unspecified jaundice: Secondary | ICD-10-CM

## 2017-06-03 NOTE — Telephone Encounter (Signed)
Patient came and spoke with Estefania in regards to [facility] referral and financial assistance. Patient states they sent his a letter with dates. Patient informed to bring letter into office so we can see how long financial assistance is for and if patient had been approved or denied. Or if financial assistance is expired. Patient aware he may need to do assistance again. Patient verbalized understanding.

## 2017-06-03 NOTE — Telephone Encounter (Signed)
Patient came into office states for the last 2 weeks patient has been getting very fatigued with walking. Patient states yesterday he got really tired just from walking to his car. Patient states he has no other symptoms and no swelling. Patient would like to know what he should do.   To Dr. Amil Amen to advise.

## 2017-06-03 NOTE — Telephone Encounter (Signed)
Per Dr. Amil Amen patient had bloodwork done today and is scheduled for follow up[ appointment with Dr. Amil Amen on tomorrow at 4 pm.

## 2017-06-04 ENCOUNTER — Ambulatory Visit (INDEPENDENT_AMBULATORY_CARE_PROVIDER_SITE_OTHER): Payer: Self-pay | Admitting: Internal Medicine

## 2017-06-04 ENCOUNTER — Encounter: Payer: Self-pay | Admitting: Internal Medicine

## 2017-06-04 VITALS — BP 110/70 | HR 64 | Resp 12 | Ht 71.0 in | Wt 224.0 lb

## 2017-06-04 DIAGNOSIS — R17 Unspecified jaundice: Secondary | ICD-10-CM

## 2017-06-04 DIAGNOSIS — R5383 Other fatigue: Secondary | ICD-10-CM

## 2017-06-04 DIAGNOSIS — R945 Abnormal results of liver function studies: Secondary | ICD-10-CM

## 2017-06-04 LAB — CBC WITH DIFFERENTIAL/PLATELET
BASOS ABS: 0 10*3/uL (ref 0.0–0.2)
BASOS: 1 %
EOS (ABSOLUTE): 0.1 10*3/uL (ref 0.0–0.4)
Eos: 2 %
Hematocrit: 37.5 % (ref 37.5–51.0)
Hemoglobin: 12.2 g/dL — ABNORMAL LOW (ref 13.0–17.7)
IMMATURE GRANS (ABS): 0 10*3/uL (ref 0.0–0.1)
IMMATURE GRANULOCYTES: 0 %
Lymphocytes Absolute: 1.2 10*3/uL (ref 0.7–3.1)
Lymphs: 27 %
MCH: 29.1 pg (ref 26.6–33.0)
MCHC: 32.5 g/dL (ref 31.5–35.7)
MCV: 90 fL (ref 79–97)
Monocytes Absolute: 0.4 10*3/uL (ref 0.1–0.9)
Monocytes: 9 %
NEUTROS PCT: 61 %
Neutrophils Absolute: 2.8 10*3/uL (ref 1.4–7.0)
PLATELETS: 147 10*3/uL — AB (ref 150–379)
RBC: 4.19 x10E6/uL (ref 4.14–5.80)
RDW: 17.7 % — ABNORMAL HIGH (ref 12.3–15.4)
WBC: 4.5 10*3/uL (ref 3.4–10.8)

## 2017-06-04 LAB — COMPREHENSIVE METABOLIC PANEL
A/G RATIO: 1.4 (ref 1.2–2.2)
ALT: 17 IU/L (ref 0–44)
AST: 22 IU/L (ref 0–40)
Albumin: 4.1 g/dL (ref 3.5–4.8)
Alkaline Phosphatase: 165 IU/L — ABNORMAL HIGH (ref 39–117)
BUN/Creatinine Ratio: 24 (ref 10–24)
BUN: 30 mg/dL — ABNORMAL HIGH (ref 8–27)
Bilirubin Total: 2.4 mg/dL — ABNORMAL HIGH (ref 0.0–1.2)
CALCIUM: 9.2 mg/dL (ref 8.6–10.2)
CO2: 23 mmol/L (ref 20–29)
Chloride: 102 mmol/L (ref 96–106)
Creatinine, Ser: 1.25 mg/dL (ref 0.76–1.27)
GFR calc Af Amer: 67 mL/min/{1.73_m2} (ref >59)
GFR, EST NON AFRICAN AMERICAN: 58 mL/min/{1.73_m2} — AB (ref >59)
GLUCOSE: 92 mg/dL (ref 65–99)
Globulin, Total: 3 g/dL (ref 1.5–4.5)
POTASSIUM: 4.1 mmol/L (ref 3.5–5.2)
Sodium: 140 mmol/L (ref 134–144)
Total Protein: 7.1 g/dL (ref 6.0–8.5)

## 2017-06-04 LAB — BILIRUBIN, DIRECT: BILIRUBIN, DIRECT: 0.93 mg/dL — AB (ref 0.00–0.40)

## 2017-06-04 LAB — TSH: TSH: 2.07 u[IU]/mL (ref 0.450–4.500)

## 2017-06-04 LAB — GAMMA GT: GGT: 309 IU/L — ABNORMAL HIGH (ref 0–65)

## 2017-06-04 NOTE — Progress Notes (Signed)
Subjective:    Patient ID: Kyle Burke, male    DOB: Apr 21, 1946, 71 y.o.   MRN: [id number]  HPI   Fatigue:  Has felt more fatigued in last month and in past 2 weeks, significantly more so.  Sleeps on 2 pillows.  No PND or orthopnea.  Does have some swelling of ankles, but no worse than usual.  No lymph node swelling or tenderness.   Noted to have increasing total bilirubin end of last month and so had him come back to recheck yesterday.  Total bilirubin now up to 2.4 with elevated alkaline phosphatase and GGT.  Direct bilirubin up to 0.93 as well.  He still has his gallbladder.  He denies any abdominal pain, nausea, stool changes.  Has not noted yellow color change to eyes, gums or skin.   Last year hospitalized with ascites/CHF 01/2016.   During the hospitalization, noted to have thickening of gallbladder wall on [place].  Followup CT of abdomen/pelvis showed questionable wall thickening of the distal gastric antrum/pylorus, could not exclude gastritis, ulcer or mass.  Was noted to have anemia as well. Underwent EGD/colonoscopy 05/14/16 with Dr. Hilarie Fredrickson  With views and benign biopsies showing antral and pyloric gastritis as well as inflammatory duodenopathy.  Colonoscopy with tubular adenoma, no high grade dysplasia and some diverticulae.  Current Meds  Medication Sig  . acetaminophen (TYLENOL) 650 MG CR tablet Take 650 mg by mouth every 8 (eight) hours as needed for pain. Reported on 02/14/2016  . atorvastatin (LIPITOR) 80 MG tablet Take 80 mg by mouth daily.  . Blood Glucose Monitoring Suppl (AGAMATRIX PRESTO PRO METER) DEVI Twice daily blood glucose checks before meals  . carvedilol (COREG) 3.125 MG tablet Take 1 tablet (3.125 mg total) by mouth 2 (two) times daily with a meal.  . ferrous gluconate (FERGON) 324 MG tablet Take 1 tablet (324 mg total) by mouth daily with breakfast.  . furosemide (LASIX) 40 MG tablet 2 tabs by mouth twice daily  . glucose blood (AGAMATRIX PRESTO TEST) test  strip Twice daily glucose checks before meals  . lisinopril (PRINIVIL,ZESTRIL) 40 MG tablet Take 1 tablet (40 mg total) by mouth daily. (Patient taking differently: Take 40 mg by mouth. 1/2 tablet daily)  . Menthol-Camphor 3-3 % GEL Apply to knees as needed for pain  . metFORMIN (GLUCOPHAGE) 500 MG tablet Take 1 tablet (500 mg total) by mouth 2 (two) times daily with a meal.  . potassium chloride (K-DUR) 10 MEQ tablet Take 1 tablet (10 mEq total) by mouth daily.  . rivaroxaban (XARELTO) 20 MG TABS tablet Take 1 tablet (20 mg total) by mouth daily with supper.    No Known Allergies    Review of Systems     Objective:   Physical Exam  NAD Deeply tanned HEENT:  Muddy sclera, perhaps with mild icterus, PERRL, EOM, TMs pearly gray, throat without injection.  MMM Neck:  Supple, No adenopathy, no thyromegaly Chest:  CTA CV:  Actually, much more regular today, No S3, or murmur, radial and DP pulses normal and equal. Abd:  S, NT, No HSM or mass, + BS LE:  Mild pitting edema to ankle  No abdominal tenderness     Assessment & Plan:  Increasing fatigue with elevating total and direct Needs radiologic evaluation--checking to see if can get into through [facility].  Did not get financial assistance with [facility] with applied.  Apparently, did not get documents in on time. Call in to see if can get MRI vs  CT ASAP.   Concerned for obstruction of biliary tract with elevated direct bilirubin. Will see about checking LDH vs haptoglobin with mildly low platelets as indirect bilirubin slightly high as well.

## 2017-06-05 ENCOUNTER — Telehealth: Payer: Self-pay | Admitting: Internal Medicine

## 2017-06-05 NOTE — Telephone Encounter (Signed)
Have contacted [facility] via phone message and email to see about getting in for MR or CT of abdomen.   Left message with Dr. Hilarie Fredrickson, GI as well.

## 2017-06-08 ENCOUNTER — Telehealth: Payer: Self-pay | Admitting: Internal Medicine

## 2017-06-08 ENCOUNTER — Telehealth: Payer: Self-pay

## 2017-06-08 DIAGNOSIS — R17 Unspecified jaundice: Secondary | ICD-10-CM

## 2017-06-08 DIAGNOSIS — R778 Other specified abnormalities of plasma proteins: Secondary | ICD-10-CM

## 2017-06-08 DIAGNOSIS — R748 Abnormal levels of other serum enzymes: Secondary | ICD-10-CM

## 2017-06-08 DIAGNOSIS — D696 Thrombocytopenia, unspecified: Secondary | ICD-10-CM

## 2017-06-08 NOTE — Telephone Encounter (Signed)
Spoke with Dr. Hilarie Fredrickson, [facility], who has looked over Kyle Burke's current labs and complaints.   Agrees with MR and MRCP to evaluate alk phos, GGT elevation as well as increased bilirubin, which is combination of indirect and direct. Patient also with minimally low platelets and low haptoglobin, suggesting intravascular hemolysis, which could be adding to his bilirubin level.  Also spoke with [employer] .  He did not get paperwork in in time previously and so was denied assistance.  He is far enough out now that he can reapply.  Will have MSW intern, Elray Mcgregor work with family on this paperwork and go ahead with MR/MRCP as well as referral to heme onc.

## 2017-06-08 NOTE — Addendum Note (Signed)
Addended by: Marcelino Duster on: 06/08/2017 04:37 PM   Modules accepted: Orders

## 2017-06-08 NOTE — Telephone Encounter (Signed)
I spoke to Dr. Amil Amen by phone Patient has had rising alkaline phosphatase, bilirubin and elevated GGT. Platelets have declined somewhat. He also was found to have a low haptoglobin She is concerned about an ongoing liver process.  She has ordered MRI/MRCP, which I feel is very appropriate. His ascites from last year was felt to be secondary to heart failure and Dr. Amil Amen reports this has not recurred. After MRI/MRCP GI follow-up is appropriate. She is also considering hematology input. We will remain available as needed

## 2017-06-08 NOTE — Telephone Encounter (Signed)
Please notify patient he is scheduled for MRI with MRCP on Tuesday 06/16/17 @ 12 pm. Patient to arrive by 11:45 am. Patient is to have nothing to eat or drink 4 hours prior to testing (nothing to eat or drink after 8 am).

## 2017-06-08 NOTE — Telephone Encounter (Signed)
Pat called [facility] left message for Sande Rives regarding referral for this patient.  Pat also prepared fax transmittal for referral and faxed to [facility].  Requested return telephone call for confirmation.

## 2017-06-09 NOTE — Telephone Encounter (Signed)
Patient contacted today. All information about MRI appointment was given and instruction about not drinking or eating 4 hours prior to testing.   Patient also aware he needs to apply for Financial Assistance as soon as possible after bill is received.

## 2017-06-10 ENCOUNTER — Other Ambulatory Visit (INDEPENDENT_AMBULATORY_CARE_PROVIDER_SITE_OTHER): Payer: Self-pay

## 2017-06-10 ENCOUNTER — Telehealth: Payer: Self-pay | Admitting: Internal Medicine

## 2017-06-10 DIAGNOSIS — R778 Other specified abnormalities of plasma proteins: Secondary | ICD-10-CM

## 2017-06-10 DIAGNOSIS — R945 Abnormal results of liver function studies: Secondary | ICD-10-CM

## 2017-06-10 NOTE — Telephone Encounter (Signed)
Tatum to make appt for patient directly rather than through [facility].  The will arrange for financial counseling.   Appointment made for Tuesday, Oct. 31 at 11 a.m. With Dr. Irene Limbo. His MR abdomen/MRCP scheduled for same day --did not realize this at time of making this appt. Cherice will call Kemp back and reschedule the Cancer center appt for a later date. Will also see if can add vitamin B12, RBC folate, retic count with latest blood draw.

## 2017-06-10 NOTE — Telephone Encounter (Signed)
MRI and Cancer center appointments do not need to be changed. [place] appointment is 06/17/17 and MRI is scheduled for 06/16/17

## 2017-06-10 NOTE — Telephone Encounter (Signed)
Spoke to patient and states he will come in today around 6:30pm since he is working late and it was a last minute appointment. Vitamin B12, RBC folate, retic count-Lab appointment scheduled 06/10/17 @ 6:30 PM

## 2017-06-11 LAB — SPECIMEN STATUS REPORT

## 2017-06-11 LAB — DIRECT ANTIGLOBULIN TEST (NOT AT ARMC): COOMBS', DIRECT: NEGATIVE

## 2017-06-13 LAB — SPECIMEN STATUS REPORT

## 2017-06-13 LAB — VITAMIN B12: Vitamin B-12: 675 pg/mL (ref 232–1245)

## 2017-06-13 LAB — RETICULOCYTES: RETIC CT PCT: 0.6 % (ref 0.6–2.6)

## 2017-06-13 LAB — FOLATE RBC: Hematocrit: 36.2 % — ABNORMAL LOW (ref 37.5–51.0)

## 2017-06-13 LAB — HAPTOGLOBIN: Haptoglobin: <10 mg/dL — ABNORMAL LOW (ref 34–200)

## 2017-06-15 NOTE — Progress Notes (Signed)
HEMATOLOGY/ONCOLOGY CONSULTATION NOTE  Date of Service: 06/17/2017  Patient Care Team: Mack Hook, MD as PCP - General (Internal Medicine)  CHIEF COMPLAINTS/PURPOSE OF CONSULTATION:  Low haptoglobin  rule out Hemolysis. Low platelets/ thrombocytopenia  HISTORY OF PRESENTING ILLNESS:   Kyle Burke is a wonderful 71 y.o. male who has been referred to [place] by Dr Mack Hook, MD for evaluation and management of thrombocytopenia/low haptoglobin -r/o hemolysis.  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  Subsequent MRI/MRCP showed IMPRESSION:Image degradation by respiratory motion artifact noted. No evidence of hepatic or pancreatic neoplasm. No evidence of biliary or pancreatic ductal dilatation. Moderate ascites. Small right pleural effusion and moderate cardiomegaly.   He has a history of HTN and DM, He was found to have a low haptoglobin of <10 and slightly low platelets at 147k on 06/03/2017. He has not had significant anemia with low haptoglobin which is inconsistent with any significant hemolysis.  He was referred to [place] for further evaluation for possible hemolytic anemia as an etiology of elevated bilirubin level.  He presents to the office accompanied by his daughter and daughter in law. He is communicating through a spanish speaking translator today. He states that he is doing well overall. He's had heart problems for 3 years now. He states that he would drink 20 beers per weekend until 3 yrs ago.  He reports no overt known liver disease in the past but has had ascites removed 2 times in the  past. His last ECHO was 02/11/2016 with results showing estimated ejection fraction of left ventricle in the range of 15-20%.  He notes that his weight has increased from 210 pounds last year to a current weight of 224 pounds which is concerning for fluid retention.  On review of systems, pt reports minor constipation, fatigue, pain and swelling to the ankles, and denies, fever, chills, night sweats, change in appetite, bloody stools and any other accompanying symptoms.   MEDICAL HISTORY:  Past Medical History:  Diagnosis Date  . Atrial fibrillation ([place]) 2016  . CAD (coronary artery disease)   . Diabetes mellitus 2001  . Hypertension 2006  . Idiopathic cardiomyopathy ([place]) 11/01/2014   Followed by Dr. Acie Fredrickson, Cardiology  EF 25%  Adenosine Cardiolite did not support ischemic etiology  . OA (osteoarthritis) of knee 01/08/2017  . Pleural effusion     SURGICAL HISTORY: Past Surgical History:  Procedure Laterality Date  . COLONOSCOPY WITH PROPOFOL N/A 05/14/2016   Procedure: COLONOSCOPY WITH PROPOFOL;  Surgeon: Jerene Bears, MD;  Location: [facility];  Service: Gastroenterology;  Laterality: N/A;  . ESOPHAGOGASTRODUODENOSCOPY (EGD) WITH PROPOFOL N/A 05/14/2016   Procedure: ESOPHAGOGASTRODUODENOSCOPY (EGD) WITH PROPOFOL;  Surgeon: Jerene Bears, MD;  Location: [facility];  Service: Gastroenterology;  Laterality: N/A;  . HERNIA REPAIR    . INCISE AND DRAIN ABCESS  1986   on leg  . INGUINAL HERNIA REPAIR  01/16/2012   Procedure: LAPAROSCOPIC INGUINAL HERNIA;  Surgeon: Gayland Curry, MD,FACS;  Location: [facility];  Service: General;  Laterality: Left;  Laparoscopic incarcerated Left inguinal hernia repair with mesh, umbilical hernia repair  . UMBILICAL HERNIA REPAIR  01/16/2012   Procedure: HERNIA REPAIR  UMBILICAL ADULT;  Surgeon: Gayland Curry, MD,FACS;  Location: [facility];  Service: General;  Laterality: N/A;    SOCIAL HISTORY: Social History   Social History  . Marital status: Married     Spouse name: N/A  . Number of children: 5  . Years of education: 9   Occupational History  . Concrete constrution     [employer]   Social History Main Topics  . Smoking status: Never Smoker  . Smokeless tobacco: Never Used  . Alcohol use No     Comment: rare occ., formerly more  . Drug use: No  . Sexual activity: Yes     Comment: But not regular   Other Topics Concern  . Not on file   Social History Narrative   Originally from [place]   Came to [place] In 1995   Lives at home with wife, a son and his wife and 3 grandchildren.    FAMILY HISTORY: Family History  Problem Relation Age of Onset  . Stroke Mother   . Heart disease Mother   . Heart disease Father   . Diabetes Father   . Kidney disease Father        , history of stones as well  . Diabetes Brother   . Cancer Sister 13       unknown soft tissue cancer of leg    ALLERGIES:  has No Known Allergies.  MEDICATIONS:  Current Outpatient Prescriptions  Medication Sig Dispense Refill  . acetaminophen (TYLENOL) 650 MG CR tablet Take 650 mg by mouth every 8 (eight) hours as needed for pain. Reported on 02/14/2016    . atorvastatin (LIPITOR) 80 MG tablet Take 80 mg by mouth daily.    . Blood Glucose Monitoring Suppl (AGAMATRIX PRESTO PRO METER) DEVI Twice daily blood glucose checks before meals 1 Device 0  . carvedilol (COREG) 3.125 MG tablet Take 1 tablet (3.125 mg total) by mouth 2 (two) times daily with a meal. 60 tablet 11  . ferrous gluconate (FERGON) 324 MG tablet Take 1 tablet (324 mg total) by mouth daily with breakfast.  3  . furosemide (LASIX) 40 MG tablet 2 tabs by mouth twice daily 120 tablet 11  . glucose blood (AGAMATRIX PRESTO TEST) test strip Twice daily glucose checks before meals 100 each 12  . Menthol-Camphor 3-3 % GEL Apply to knees as needed for pain  0  . metFORMIN (GLUCOPHAGE) 500 MG tablet Take 1 tablet (500 mg total) by mouth 2 (two) times daily with a meal. 60 tablet 11  . potassium chloride  (K-DUR) 10 MEQ tablet Take 1 tablet (10 mEq total) by mouth daily. 30 tablet 11  . rivaroxaban (XARELTO) 20 MG TABS tablet Take 1 tablet (20 mg total) by mouth daily with supper. 30 tablet 11   No current facility-administered medications for this visit.     REVIEW OF SYSTEMS:    10 Point review of Systems was done is negative except as noted above.  PHYSICAL EXAMINATION: ECOG PERFORMANCE STATUS: . Vitals:   06/17/17 1049  BP: 112/69  Pulse: 66  Temp: 98 F (36.7 C)  SpO2: 97%   Filed Weights   06/17/17 1049  Weight: 224 lb 14.4 oz (102 kg)   .Body mass index is 31.37 kg/m.  GENERAL:alert, in no acute distress and comfortable SKIN: no acute rashes, no significant lesions EYES: conjunctiva are pink and non-injected, sclera anicteric OROPHARYNX: MMM, no exudates, no oropharyngeal erythema or ulceration NECK: supple, no JVD LYMPH:  no palpable lymphadenopathy in the cervical, axillary or inguinal regions LUNGS: clear to auscultation b/l with normal respiratory effort HEART: regular rate & rhythm ABDOMEN:  normoactive bowel sounds , non tender, not distended. Extremity: no pedal edema PSYCH: alert & oriented x 3 with fluent speech NEURO: no focal motor/sensory deficits  LABORATORY DATA:  I have reviewed the data as listed  . CBC Latest Ref Rng & Units 06/17/2017 06/17/2017 06/10/2017  WBC 4.0 - 10.3 10e3/uL 4.9 - -  Hemoglobin 13.0 - 17.1 g/dL 12.5(L) - -  Hematocrit 37.5 - 51.0 % 38.7 38.0 36.2(L)  Platelets 140 - 400 10e3/uL 140 - -    . CMP Latest Ref Rng & Units 06/17/2017 06/03/2017 05/14/2017  Glucose 70 - 140 mg/dl 142(H) 92 88  BUN 7.0 - 26.0 mg/dL 26.5(H) 30(H) 27  Creatinine 0.7 - 1.3 mg/dL 1.3 1.25 1.32(H)  Sodium 136 - 145 mEq/L 140 140 143  Potassium 3.5 - 5.1 mEq/L 3.7 4.1 4.4  Chloride 96 - 106 mmol/L - 102 101  CO2 22 - 29 mEq/L 29 23 25   Calcium 8.4 - 10.4 mg/dL 9.4 9.2 9.2  Total Protein 6.4 - 8.3 g/dL 7.4 7.1 7.3  Total Bilirubin 0.20 -  1.20 mg/dL 2.35(H) 2.4(H) 1.9(H)  Alkaline Phos 40 - 150 U/L 172(H) 165(H) 184(H)  AST 5 - 34 U/L 27 22 24   ALT 0 - 55 U/L 21 17 17    Component     Latest Ref Rng & Units 06/03/2017 06/10/2017 06/17/2017  Folate, Hemolysate     Not Estab. ng/mL  CANCELED 513.4  HCT     37.5 - 51.0 %  36.2 (L) 38.0  Folate, RBC     >498 ng/mL  CANCELED 1,351  BILIRUBIN, DIRECT     0.00 - 0.40 mg/dL 0.93 (H)  0.82 (H)  Haptoglobin     34 - 200 mg/dL <10 (L)  21 (L)  Vitamin B12     232 - 1,245 pg/mL  675   Retic Ct Pct     0.6 - 2.6 %  0.6   LDH     125 - 245 U/L   212  Coombs', Direct     Negative   Negative     RADIOGRAPHIC STUDIES: I have personally reviewed the radiological images as listed and agreed with the findings in the report. Mr 3d Recon At Scanner  Result Date: 06/16/2017 CLINICAL DATA:  Painless jaundice, weight loss, and fatigue for over 3 months. Idiopathic cardiomyopathy and congestive heart failure. EXAM: MRI ABDOMEN WITHOUT AND WITH CONTRAST (INCLUDING MRCP) TECHNIQUE: Multiplanar multisequence MR imaging of the abdomen was performed both before and after the administration of intravenous contrast. Heavily T2-weighted images of the biliary and pancreatic ducts were obtained, and three-dimensional MRCP images were rendered by post processing. CONTRAST:  84mL MULTIHANCE GADOBENATE DIMEGLUMINE 529 MG/ML IV SOLN COMPARISON:  CT on 02/10/2016 FINDINGS: Image degradation by respiratory motion artifact noted. Lower chest: Moderate cardiomegaly.  Small right pleural effusion. Hepatobiliary: No hepatic masses identified. Gallbladder is unremarkable. No evidence of biliary ductal dilatation, with common duct measuring 6 mm. No evidence of choledocholithiasis. Pancreas: No mass or inflammatory changes. No evidence of pancreatic ductal dilatation or pancreas divisum. Spleen:  Within normal limits in size and appearance. Adrenals/Urinary Tract: No masses identified. No evidence of hydronephrosis.  Stomach/Bowel: Visualized portions within the abdomen are unremarkable. Vascular/Lymphatic: No pathologically enlarged lymph nodes identified. No abdominal aortic aneurysm. Other:  Moderate ascites, similar to previous study. Musculoskeletal:  No suspicious bone lesions identified. IMPRESSION: Image degradation by respiratory motion artifact noted. No evidence of hepatic or pancreatic neoplasm. No evidence of biliary or pancreatic ductal dilatation. Moderate ascites. Small right pleural effusion and moderate cardiomegaly. Electronically Signed   By: Earle Gell M.D.   On: 06/16/2017 15:55   Mr Abdomen Mrcp Moise Boring Contast  Result Date: 06/16/2017 CLINICAL DATA:  Painless jaundice, weight loss, and fatigue for over 3 months. Idiopathic cardiomyopathy and congestive heart failure. EXAM: MRI ABDOMEN WITHOUT AND WITH CONTRAST (INCLUDING MRCP) TECHNIQUE: Multiplanar multisequence MR imaging of the abdomen was performed both before and after the administration of intravenous contrast. Heavily T2-weighted images of the biliary and pancreatic ducts were obtained, and three-dimensional MRCP images were rendered by post processing. CONTRAST:  81mL MULTIHANCE GADOBENATE DIMEGLUMINE 529 MG/ML IV SOLN COMPARISON:  CT on 02/10/2016 FINDINGS: Image degradation by respiratory motion artifact noted. Lower chest: Moderate cardiomegaly.  Small right pleural effusion. Hepatobiliary: No hepatic masses identified. Gallbladder is unremarkable. No evidence of biliary ductal dilatation, with common duct measuring 6 mm. No evidence of choledocholithiasis. Pancreas: No mass or inflammatory changes. No evidence of pancreatic ductal dilatation or pancreas divisum. Spleen:  Within normal limits in size and appearance. Adrenals/Urinary Tract: No masses identified. No evidence of hydronephrosis. Stomach/Bowel: Visualized portions within the abdomen are unremarkable. Vascular/Lymphatic: No pathologically enlarged lymph nodes identified. No  abdominal aortic aneurysm. Other:  Moderate ascites, similar to previous study. Musculoskeletal:  No suspicious bone lesions identified. IMPRESSION: Image degradation by respiratory motion artifact noted. No evidence of hepatic or pancreatic neoplasm. No evidence of biliary or pancreatic ductal dilatation. Moderate ascites. Small right pleural effusion and moderate cardiomegaly. Electronically Signed   By: Earle Gell M.D.   On: 06/16/2017 15:55    ASSESSMENT & PLAN:   Kyle Burke is a delightful 71 y.o. male presenting with  1. Hyperbilirubinemia - mostly direct. This appears to likely be related to chronic passive venous congestion of the liver related to severe nonischemic cardiomyopathy. Congestive hepatopathy can certainly present like this.  No evidence of significant hemolysis given stability in hemoglobin with no significant anemia.  2. Low haptoglobin level  -this can be related to liver disease with decreased production or some mild low-level hemolysis related to his valvular heart disease. repeat haptoglobin level has improved to 21. This does not appear to represent significant hemolytic anemia based on absence of significant anemia. Hemoglobin today is stable at 12.5. Coombs test is negative Patient does not have increased reticulocytosis. Overall no evidence of overt ongoing hemolysis. . Lab Results  Component Value Date   RETICCTPCT 0.62 (L) 06/17/2017   RBC 4.21 06/17/2017   RETICCTABS 26.10 (L) 06/17/2017   3. Mild thrombocytopenia -with no evidence of bleeding. Likely related to liver disease or medications. Plan -I explained in detail to the patient the reason for hematology consultation and all the results of the lab testing thus far. -I explained the likely etiology as per me for his abnormal bilirubin levels and the low likelihood that he has a significant hemolytic anemia at this time. -reasonable to take a B complex or Foley cath site for low-level hemolysis  related to valvular heart disease. -Would defer additional workup for abnormal liver function tests and need for liver biopsy/ERCP to his primary care  Physician Dr. Amil Amen and his gastroenterologist Dr. Hilarie Fredrickson based on his goals of care. -he will need active follow-up with primary care physician and cardiology to optimize his diuretic therapy for his severe CHF. -Recommended  pt to monitor weight at home as well as decrease sodium intake. -recommendations were discussed with the use of a Spanish interpreter and all the questions from the patient and his family were answered in details.  RTC with Dr. Irene Limbo as needed Continue follow-up with primary care physician cardiology and GI.  All of the patients questions were answered with apparent satisfaction. The patient knows to call the clinic with any problems, questions or concerns.  I spent 45 minutes counseling the patient face to face. The total time spent in the appointment was 60 minutes and more than 50% was on counseling and direct patient cares.    Sullivan Lone MD MS AAHIVMS [facility] Hematology/Oncology Physician [facility]  (Office):       [phone] (Work cell):  [phone] (Fax):           [phone]  06/17/2017 10:05 AM  A spanish-speaking translator was present at all times during this appointment.  This document serves as a record of services personally performed by Sullivan Lone, MD. It was created on her behalf by Alean Rinne, a trained medical scribe. The creation of this record is based on the scribe's personal observations and the provider's statements to them. This document has been checked and approved by the attending provider.

## 2017-06-16 ENCOUNTER — Other Ambulatory Visit: Payer: Self-pay | Admitting: Internal Medicine

## 2017-06-16 ENCOUNTER — Ambulatory Visit (HOSPITAL_COMMUNITY)
Admission: RE | Admit: 2017-06-16 | Discharge: 2017-06-16 | Disposition: A | Discharge status: Home / Self Care | Payer: Self-pay | Source: Ambulatory Visit | Attending: Internal Medicine | Admitting: Internal Medicine

## 2017-06-16 DIAGNOSIS — R188 Other ascites: Secondary | ICD-10-CM | POA: Insufficient documentation

## 2017-06-16 DIAGNOSIS — I517 Cardiomegaly: Secondary | ICD-10-CM | POA: Insufficient documentation

## 2017-06-16 DIAGNOSIS — R17 Unspecified jaundice: Secondary | ICD-10-CM

## 2017-06-16 MED ORDER — GADOBENATE DIMEGLUMINE 529 MG/ML IV SOLN
20.0000 mL | Freq: Once | INTRAVENOUS | Status: AC | PRN
  Administered 2017-06-16: 20 mL via INTRAVENOUS

## 2017-06-17 ENCOUNTER — Ambulatory Visit (HOSPITAL_BASED_OUTPATIENT_CLINIC_OR_DEPARTMENT_OTHER): Payer: Self-pay

## 2017-06-17 ENCOUNTER — Ambulatory Visit (HOSPITAL_BASED_OUTPATIENT_CLINIC_OR_DEPARTMENT_OTHER): Payer: Self-pay | Admitting: Hematology

## 2017-06-17 ENCOUNTER — Telehealth: Payer: Self-pay

## 2017-06-17 ENCOUNTER — Encounter: Payer: Self-pay | Admitting: Hematology

## 2017-06-17 ENCOUNTER — Ambulatory Visit: Payer: Self-pay

## 2017-06-17 VITALS — BP 112/69 | HR 66 | Temp 98.0°F | Ht 71.0 in | Wt 224.9 lb

## 2017-06-17 DIAGNOSIS — D599 Acquired hemolytic anemia, unspecified: Secondary | ICD-10-CM

## 2017-06-17 DIAGNOSIS — E119 Type 2 diabetes mellitus without complications: Secondary | ICD-10-CM

## 2017-06-17 DIAGNOSIS — I10 Essential (primary) hypertension: Secondary | ICD-10-CM

## 2017-06-17 DIAGNOSIS — R778 Other specified abnormalities of plasma proteins: Secondary | ICD-10-CM

## 2017-06-17 DIAGNOSIS — Z809 Family history of malignant neoplasm, unspecified: Secondary | ICD-10-CM

## 2017-06-17 DIAGNOSIS — D696 Thrombocytopenia, unspecified: Secondary | ICD-10-CM

## 2017-06-17 DIAGNOSIS — D649 Anemia, unspecified: Secondary | ICD-10-CM

## 2017-06-17 DIAGNOSIS — I4891 Unspecified atrial fibrillation: Secondary | ICD-10-CM

## 2017-06-17 LAB — CBC & DIFF AND RETIC
BASO%: 0.4 % (ref 0.0–2.0)
Basophils Absolute: 0 10*3/uL (ref 0.0–0.1)
EOS%: 2 % (ref 0.0–7.0)
Eosinophils Absolute: 0.1 10*3/uL (ref 0.0–0.5)
HCT: 38.7 % (ref 38.4–49.9)
HGB: 12.5 g/dL — ABNORMAL LOW (ref 13.0–17.1)
Immature Retic Fract: 4.7 % (ref 3.00–10.60)
LYMPH%: 21 % (ref 14.0–49.0)
MCH: 29.7 pg (ref 27.2–33.4)
MCHC: 32.3 g/dL (ref 32.0–36.0)
MCV: 91.9 fL (ref 79.3–98.0)
MONO#: 0.4 10*3/uL (ref 0.1–0.9)
MONO%: 9 % (ref 0.0–14.0)
NEUT%: 67.6 % (ref 39.0–75.0)
NEUTROS ABS: 3.3 10*3/uL (ref 1.5–6.5)
PLATELETS: 140 10*3/uL (ref 140–400)
RBC: 4.21 10*6/uL (ref 4.20–5.82)
RDW: 16.7 % — ABNORMAL HIGH (ref 11.0–14.6)
Retic %: 0.62 % — ABNORMAL LOW (ref 0.80–1.80)
Retic Ct Abs: 26.1 10*3/uL — ABNORMAL LOW (ref 34.80–93.90)
WBC: 4.9 10*3/uL (ref 4.0–10.3)
lymph#: 1 10*3/uL (ref 0.9–3.3)

## 2017-06-17 LAB — COMPREHENSIVE METABOLIC PANEL
ALBUMIN: 3.9 g/dL (ref 3.5–5.0)
ALK PHOS: 172 U/L — AB (ref 40–150)
ALT: 21 U/L (ref 0–55)
ANION GAP: 8 meq/L (ref 3–11)
AST: 27 U/L (ref 5–34)
BILIRUBIN TOTAL: 2.35 mg/dL — AB (ref 0.20–1.20)
BUN: 26.5 mg/dL — ABNORMAL HIGH (ref 7.0–26.0)
CO2: 29 meq/L (ref 22–29)
Calcium: 9.4 mg/dL (ref 8.4–10.4)
Chloride: 102 mEq/L (ref 98–109)
Creatinine: 1.3 mg/dL (ref 0.7–1.3)
EGFR: 55 mL/min/{1.73_m2} — AB (ref >60)
GLUCOSE: 142 mg/dL — AB (ref 70–140)
POTASSIUM: 3.7 meq/L (ref 3.5–5.1)
SODIUM: 140 meq/L (ref 136–145)
Total Protein: 7.4 g/dL (ref 6.4–8.3)

## 2017-06-17 LAB — CHCC SMEAR

## 2017-06-17 LAB — LACTATE DEHYDROGENASE: LDH: 212 U/L (ref 125–245)

## 2017-06-17 NOTE — Telephone Encounter (Signed)
Printed avs . NO Return visit schedule until lab test results are view by [place]. Per 10/31 los

## 2017-06-17 NOTE — Patient Instructions (Signed)
Thank you for choosing [facility] to provide your oncology and hematology care.  To afford each patient quality time with our providers, please arrive 30 minutes before your scheduled appointment time.  If you arrive late for your appointment, you may be asked to reschedule.  We strive to give you quality time with our providers, and arriving late affects you and other patients whose appointments are after yours.   If you are a no show for multiple scheduled visits, you may be dismissed from the clinic at the providers discretion.    Again, thank you for choosing [facility], our hope is that these requests will decrease the amount of time that you wait before being seen by our physicians.  ______________________________________________________________________  Should you have questions after your visit to the [facility], please contact our office at [phone] between the hours of 8:30 and 4:30 p.m.    Voicemails left after 4:30p.m will not be returned until the following business day.    For prescription refill requests, please have your pharmacy contact us directly.  Please also try to allow 48 hours for prescription requests.    Please contact the scheduling department for questions regarding scheduling.  For scheduling of procedures such as PET scans, CT scans, MRI, Ultrasound, etc please contact central scheduling at [phone].    Resources For Cancer Patients and Caregivers:   Oncolink.org:  A wonderful resource for patients and healthcare providers for information regarding your disease, ways to tract your treatment, what to expect, etc.     American Cancer Society:  800-227-2345  Can help patients locate various types of support and financial assistance  Cancer Care: 1-800-813-HOPE (4673) Provides financial assistance, online support groups, medication/co-pay assistance.    [place] DSS:  336-641-3447 Where to apply for food  stamps, Medicaid, and utility assistance  Medicare Rights Center: 800-333-4114 Helps people with Medicare understand their rights and benefits, navigate the Medicare system, and secure the quality healthcare they deserve  SCAT: 336-333-6589 [place] [employer]'s shared-ride transportation service for eligible riders who have a disability that prevents them from riding the fixed route bus.    For additional information on assistance programs please contact our social worker:   Grier Hock/Abigail Elmore:  [phone]            

## 2017-06-18 ENCOUNTER — Other Ambulatory Visit: Payer: Self-pay

## 2017-06-18 LAB — FOLATE RBC
FOLATE, HEMOLYSATE: 513.4 ng/mL
Folate, RBC: 1351 ng/mL (ref >498)
HEMATOCRIT: 38 % (ref 37.5–51.0)

## 2017-06-18 LAB — BILIRUBIN, DIRECT: BILIRUBIN, DIRECT: 0.82 mg/dL — AB (ref 0.00–0.40)

## 2017-06-18 LAB — DIRECT ANTIGLOBULIN TEST (NOT AT ARMC): Coombs', Direct: NEGATIVE

## 2017-06-18 LAB — HAPTOGLOBIN: Haptoglobin: 21 mg/dL — ABNORMAL LOW (ref 34–200)

## 2017-07-01 ENCOUNTER — Telehealth: Payer: Self-pay | Admitting: Internal Medicine

## 2017-07-01 NOTE — Telephone Encounter (Signed)
Patient states he went to his Oncology appointment and was told that he needed to contact his PCP so she can review results and refer him to a GI specialist. Patient wants to know if he needs to come in for an OV to discuss it. Please advise

## 2017-07-02 NOTE — Telephone Encounter (Signed)
Spoke with Dr. Amil Amen. Talked to Dr. Hilarie Fredrickson. Seems to be on same page with work up. Not sure that he needs anything further done. Dr. Amil Amen will reach out to Dr. Hilarie Fredrickson and will let him know what if anything he needs. At this point no need to see GI until after she speaks with Dr. Hilarie Fredrickson

## 2017-07-02 NOTE — Telephone Encounter (Signed)
Patient contacted and notified on 07/02/17.

## 2017-07-15 ENCOUNTER — Other Ambulatory Visit: Payer: Self-pay | Admitting: Licensed Clinical Social Worker

## 2017-07-15 DIAGNOSIS — Z7189 Other specified counseling: Secondary | ICD-10-CM
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
## 2017-07-30 ENCOUNTER — Other Ambulatory Visit (INDEPENDENT_AMBULATORY_CARE_PROVIDER_SITE_OTHER): Payer: Self-pay | Admitting: Licensed Clinical Social Worker

## 2017-07-30 DIAGNOSIS — Z729 Problem related to lifestyle, unspecified: Secondary | ICD-10-CM

## 2017-07-30 NOTE — Progress Notes (Signed)
   THERAPY PROGRESS NOTE  Session Time: 60 min  Participation Level: Active  Behavioral Response: CasualAlertNA  Type of Therapy: Individual Therapy  Treatment Goals addressed: Coping  Interventions: Supportive  Summary: Kyle Burke is a 71 y.o. male who presents withwith a positive mood and appropriate affect. Kyle Burke reported that he was feeling good today  Kyle Burke reported that since his health has been improving lately, he is not worry anymore. Kyle Burke reported sleeping better and having more energy to walk around. Kyle Burke reported that his daughters from [place] and [place] will be visiting and he feels very happy and excited to see them after a year. Kyle Burke feel that he won't need any further counseling services .   Suicidal/Homicidal: Nowithout intent/plan  Therapist Response: Network engineer (SWI) reviewed [name] issues with him. SWI offered further services and gave contact information to be used at client's convenience. SWI will check regarding immigration processes for [place] and wife.   Plan: Return again in 0 weeks.  Diagnosis: Axis I:     Axis II:     Nicola Police, Student-Social Work 07/30/2017

## 2017-08-05 ENCOUNTER — Encounter: Payer: Self-pay | Admitting: Internal Medicine

## 2017-08-05 ENCOUNTER — Ambulatory Visit: Payer: Self-pay | Admitting: Internal Medicine

## 2017-08-05 VITALS — BP 124/78 | HR 85 | Temp 97.8°F | Resp 12 | Ht 71.0 in | Wt 215.0 lb

## 2017-08-05 DIAGNOSIS — J019 Acute sinusitis, unspecified: Secondary | ICD-10-CM

## 2017-08-05 DIAGNOSIS — E119 Type 2 diabetes mellitus without complications: Secondary | ICD-10-CM

## 2017-08-05 LAB — GLUCOSE, POCT (MANUAL RESULT ENTRY): POC GLUCOSE: 193 mg/dL — AB (ref 70–99)

## 2017-08-05 MED ORDER — AMOXICILLIN-POT CLAVULANATE 875-125 MG PO TABS: ORAL_TABLET | ORAL | 0 refills | Status: DC

## 2017-08-05 NOTE — Patient Instructions (Addendum)
Drink water Call if you get worse or develop shortness of breath Saline nasal spray as often as you want to use it.   Recommend a cool mist humidifier or vaporizer

## 2017-08-05 NOTE — Progress Notes (Signed)
   Subjective:    Patient ID: Kyle Burke, male    DOB: April 18, 1946, 71 y.o.   MRN: [id number]  HPI   Cough and congestion for 2 weeks.  Dry cough initially, then developed a productive cough of light green sputum. No fever.  No dyspnea.   Feels the problem is more in his throat and upper airways.  Some sneezing.  No sinus or headache pain.  Lot of posterior pharyngeal drainage. No eye or nose itching or watering Sugars have been low 100s  Current Meds  Medication Sig  . acetaminophen (TYLENOL) 650 MG CR tablet Take 650 mg by mouth every 8 (eight) hours as needed for pain. Reported on 02/14/2016  . atorvastatin (LIPITOR) 80 MG tablet Take 80 mg by mouth daily.  . Blood Glucose Monitoring Suppl (AGAMATRIX PRESTO PRO METER) DEVI Twice daily blood glucose checks before meals  . carvedilol (COREG) 3.125 MG tablet Take 1 tablet (3.125 mg total) by mouth 2 (two) times daily with a meal.  . ferrous gluconate (FERGON) 324 MG tablet Take 1 tablet (324 mg total) by mouth daily with breakfast.  . furosemide (LASIX) 40 MG tablet 2 tabs by mouth twice daily  . glucose blood (AGAMATRIX PRESTO TEST) test strip Twice daily glucose checks before meals  . Menthol-Camphor 3-3 % GEL Apply to knees as needed for pain  . metFORMIN (GLUCOPHAGE) 500 MG tablet Take 1 tablet (500 mg total) by mouth 2 (two) times daily with a meal.  . potassium chloride (K-DUR) 10 MEQ tablet Take 1 tablet (10 mEq total) by mouth daily.  . rivaroxaban (XARELTO) 20 MG TABS tablet Take 1 tablet (20 mg total) by mouth daily with supper.    No Known Allergies Coricidin HBP not helping.       Review of Systems     Objective:   Physical Exam  NAD, sounds nasally congested HEENT: PERRL, EOMI, TMs pearly gray, Nasal mucosa erythematous and swollen with green mucous. NT over frontal and maxillary sinuses. Neck: Supple, No adenopathy Chest:  CTA CV:  Irregularly irregular, radial pulses normal and equal LE:  Mild edema--better  than recent baseline         Assessment & Plan:  Acute Sinusitis:  Augmentin 875/125 mg twice daily for 10 days.  Nasal saline.  Push fluids Cool mist humidifier. Progress report in 2 days.

## 2017-08-07 ENCOUNTER — Telehealth: Payer: Self-pay | Admitting: Internal Medicine

## 2017-08-07 NOTE — Telephone Encounter (Signed)
Need progress report--please see if he has been taking Augmentin and for how many doses and if doing any better.

## 2017-08-07 NOTE — Telephone Encounter (Signed)
Dr. Amil Amen was verbally informed by Estefania.

## 2017-08-07 NOTE — Telephone Encounter (Signed)
Patient states he started taking Augmentin yesterday . 1 tablet in the AM and 1 tablet in the PM Today he took 1 tablet in the morning and will continue to take until is finished.  He is feeling better and was able to sleep good through the night.

## 2017-08-14 ENCOUNTER — Other Ambulatory Visit: Payer: Self-pay

## 2017-08-14 MED ORDER — ATORVASTATIN CALCIUM 80 MG PO TABS: 80.0000 mg | ORAL_TABLET | Freq: Every day | ORAL | 11 refills | Status: DC

## 2017-09-01 NOTE — Telephone Encounter (Signed)
Signed in error  °

## 2017-09-16 ENCOUNTER — Other Ambulatory Visit: Payer: Self-pay

## 2017-09-16 MED ORDER — FUROSEMIDE 40 MG PO TABS: ORAL_TABLET | ORAL | 11 refills | Status: DC

## 2017-09-17 ENCOUNTER — Ambulatory Visit: Payer: Self-pay | Admitting: Internal Medicine

## 2017-09-17 ENCOUNTER — Encounter: Payer: Self-pay | Admitting: Internal Medicine

## 2017-09-17 VITALS — BP 118/78 | HR 70 | Resp 12 | Ht 71.0 in | Wt 219.0 lb

## 2017-09-17 DIAGNOSIS — R17 Unspecified jaundice: Secondary | ICD-10-CM

## 2017-09-17 DIAGNOSIS — I5022 Chronic systolic (congestive) heart failure: Secondary | ICD-10-CM

## 2017-09-17 DIAGNOSIS — I482 Chronic atrial fibrillation, unspecified: Secondary | ICD-10-CM

## 2017-09-17 DIAGNOSIS — E119 Type 2 diabetes mellitus without complications: Secondary | ICD-10-CM

## 2017-09-17 DIAGNOSIS — I10 Essential (primary) hypertension: Secondary | ICD-10-CM

## 2017-09-17 DIAGNOSIS — M79604 Pain in right leg: Secondary | ICD-10-CM

## 2017-09-17 DIAGNOSIS — M79605 Pain in left leg: Secondary | ICD-10-CM

## 2017-09-17 NOTE — Progress Notes (Signed)
   Subjective:    Patient ID: Kyle Burke, male    DOB: March 26, 1946, 72 y.o.   MRN: [id number]  HPI   1.  Pain of knees and calves bilaterally:  Does not bother him at night.  Only bothers him with walking a long way or after working.  Resolves in about 1/2 hour after sitting.  Starts when he gets back up and starts walking again.  Not a cramping or sharp pain.  Has had this for 3-4 months.  Started in right leg, then left leg.  2.  Cardiomyopathy/atrial fibrillation/hypertension:  No palpitations, no chest pain.  No dyspnea.  No swelling of ankles.   No abdominal pain.  No orthopnea or PND.  3.  DM:  Fasting sugars are always around 95 daily.  Current Meds  Medication Sig  . acetaminophen (TYLENOL) 650 MG CR tablet Take 650 mg by mouth every 8 (eight) hours as needed for pain. Reported on 02/14/2016  . atorvastatin (LIPITOR) 80 MG tablet Take 1 tablet (80 mg total) by mouth daily.  . Blood Glucose Monitoring Suppl (AGAMATRIX PRESTO PRO METER) DEVI Twice daily blood glucose checks before meals  . carvedilol (COREG) 3.125 MG tablet Take 1 tablet (3.125 mg total) by mouth 2 (two) times daily with a meal.  . ferrous gluconate (FERGON) 324 MG tablet Take 1 tablet (324 mg total) by mouth daily with breakfast.  . furosemide (LASIX) 40 MG tablet 2 tabs by mouth twice daily  . glucose blood (AGAMATRIX PRESTO TEST) test strip Twice daily glucose checks before meals  . Menthol-Camphor 3-3 % GEL Apply to knees as needed for pain  . metFORMIN (GLUCOPHAGE) 500 MG tablet Take 1 tablet (500 mg total) by mouth 2 (two) times daily with a meal.  . potassium chloride (K-DUR) 10 MEQ tablet Take 1 tablet (10 mEq total) by mouth daily.  . rivaroxaban (XARELTO) 20 MG TABS tablet Take 1 tablet (20 mg total) by mouth daily with supper.    No Known Allergies  Review of Systems     Objective:   Physical Exam NAD HEENT:  Possibly slight scleral icterus. Lungs:  CTA CV:  Irregularly irregular, Radial pulses  normodynamic and equal. Abd:  S, NT, No HSM or mass, + BS LE:  No edema.  Feet pink with legs elevated, but become faintly cyanotic with feet dependent.  Feet cool to touch in comparison to hands.  Unable to palpate PT bilaterally, decreased right DP pulse, but good DP on left.       Assessment & Plan:  1.  Leg pain:  Evaluate for PAD with ABI Check CBC, CMP  2. Elevated bilirubin:  Felt secondary to liver congestion with cardiomyopathy.  Recheck CMP.  3.  DM:  Remains well controlled  4.  Essential Hypertension:  Well controlled.  5.  Atrial Fibrillation and dilated cardiomyopathy:  HR controlled and stable.  Follow up after we see when we can get the ABI set up.

## 2017-09-17 NOTE — Patient Instructions (Signed)
Call clinic when you have information about financial assistance.

## 2017-09-18 LAB — CBC WITH DIFFERENTIAL/PLATELET
BASOS ABS: 0 10*3/uL (ref 0.0–0.2)
Basos: 1 %
EOS (ABSOLUTE): 0.1 10*3/uL (ref 0.0–0.4)
Eos: 2 %
Hematocrit: 36.1 % — ABNORMAL LOW (ref 37.5–51.0)
Hemoglobin: 12.4 g/dL — ABNORMAL LOW (ref 13.0–17.7)
Immature Grans (Abs): 0 10*3/uL (ref 0.0–0.1)
Immature Granulocytes: 0 %
LYMPHS ABS: 1.1 10*3/uL (ref 0.7–3.1)
Lymphs: 26 %
MCH: 29.9 pg (ref 26.6–33.0)
MCHC: 34.3 g/dL (ref 31.5–35.7)
MCV: 87 fL (ref 79–97)
MONOS ABS: 0.5 10*3/uL (ref 0.1–0.9)
Monocytes: 11 %
Neutrophils Absolute: 2.7 10*3/uL (ref 1.4–7.0)
Neutrophils: 60 %
Platelets: 137 10*3/uL — ABNORMAL LOW (ref 150–379)
RBC: 4.15 x10E6/uL (ref 4.14–5.80)
RDW: 17.4 % — AB (ref 12.3–15.4)
WBC: 4.4 10*3/uL (ref 3.4–10.8)

## 2017-09-18 LAB — COMPREHENSIVE METABOLIC PANEL
ALBUMIN: 4.3 g/dL (ref 3.5–4.8)
ALK PHOS: 185 IU/L — AB (ref 39–117)
ALT: 17 IU/L (ref 0–44)
AST: 25 IU/L (ref 0–40)
Albumin/Globulin Ratio: 1.4 (ref 1.2–2.2)
BILIRUBIN TOTAL: 2.2 mg/dL — AB (ref 0.0–1.2)
BUN / CREAT RATIO: 27 — AB (ref 10–24)
BUN: 32 mg/dL — AB (ref 8–27)
CO2: 26 mmol/L (ref 20–29)
CREATININE: 1.19 mg/dL (ref 0.76–1.27)
Calcium: 9.4 mg/dL (ref 8.6–10.2)
Chloride: 103 mmol/L (ref 96–106)
GFR calc Af Amer: 71 mL/min/{1.73_m2} (ref >59)
GFR calc non Af Amer: 62 mL/min/{1.73_m2} (ref >59)
GLUCOSE: 80 mg/dL (ref 65–99)
Globulin, Total: 3.1 g/dL (ref 1.5–4.5)
Potassium: 4.6 mmol/L (ref 3.5–5.2)
SODIUM: 143 mmol/L (ref 134–144)
Total Protein: 7.4 g/dL (ref 6.0–8.5)

## 2017-09-22 ENCOUNTER — Telehealth: Payer: Self-pay | Admitting: Internal Medicine

## 2017-09-22 NOTE — Telephone Encounter (Signed)
To Dr. Mulberry for further direction 

## 2017-09-22 NOTE — Telephone Encounter (Signed)
° ° °  Patient came in with approved letter from [place] at [facility]. Patient is approved for 100% until 12/15/2017. Letter scanned in Epic.  Patient wants to know if he can be referred to specialist for his knee problem and also to see if he could have the defibrillator for his heart now that he has the financial assistance.  Please advise

## 2017-09-28 NOTE — Telephone Encounter (Signed)
Patient needs refill

## 2017-09-30 ENCOUNTER — Telehealth: Payer: Self-pay | Admitting: Internal Medicine

## 2017-09-30 NOTE — Telephone Encounter (Signed)
To Dr. Mulberry for further direction 

## 2017-09-30 NOTE — Telephone Encounter (Signed)
Patient called stating Rx for rivaroxaban (XARELTO) 20 MG TABS tablet is about to expire on march. Patient was instructed to contact his PCP to check if patient needs to continue in this medication and to check if is any documentation need it for patient to re-apply for  assistance thru [facility] please Advise.

## 2017-10-02 NOTE — Telephone Encounter (Signed)
He needs to stay on Xarelto.  He needs to go to [facility] ASAP to get resigned up for the program

## 2017-10-06 NOTE — Telephone Encounter (Signed)
To nicky

## 2017-10-06 NOTE — Telephone Encounter (Signed)
To Kyle Burke please contact patient

## 2017-10-07 NOTE — Telephone Encounter (Signed)
Patient was contacted and information was given on 10/06/17.

## 2017-10-08 ENCOUNTER — Telehealth: Payer: Self-pay | Admitting: Internal Medicine

## 2017-10-08 NOTE — Telephone Encounter (Signed)
Patient came in [employer] application for xarelto and wanted to Dr. Amil Amen to fill out the provider section but under the provider section was the name of the provider at [place] with the [facility] program and stated that patient will not need to worry about the application that she will take care of it for him now that she confirmed patient needs to stay on xarelto.  Patient aware of this information

## 2017-10-20 ENCOUNTER — Other Ambulatory Visit: Payer: Self-pay

## 2017-10-20 MED ORDER — CARVEDILOL 3.125 MG PO TABS: 3.1250 mg | ORAL_TABLET | Freq: Two times a day (BID) | ORAL | 11 refills | Status: DC

## 2017-10-27 ENCOUNTER — Other Ambulatory Visit: Payer: Self-pay | Admitting: Internal Medicine

## 2017-10-27 DIAGNOSIS — I482 Chronic atrial fibrillation, unspecified: Secondary | ICD-10-CM

## 2017-10-30 ENCOUNTER — Telehealth: Payer: Self-pay | Admitting: Internal Medicine

## 2017-10-30 ENCOUNTER — Other Ambulatory Visit: Payer: Self-pay | Admitting: Internal Medicine

## 2017-10-30 DIAGNOSIS — M79605 Pain in left leg: Secondary | ICD-10-CM

## 2017-10-30 DIAGNOSIS — M79604 Pain in right leg: Secondary | ICD-10-CM

## 2017-10-30 DIAGNOSIS — M170 Bilateral primary osteoarthritis of knee: Secondary | ICD-10-CM

## 2017-10-30 NOTE — Telephone Encounter (Signed)
Patient called stating received financial assistance letter two weeks ago and is waiting to hear from Dr. Amil Amen where will be referred for knee pain.  Please advise.

## 2017-10-30 NOTE — Telephone Encounter (Signed)
Will refer to [place]. [name], please ask if he is still going to [facility], because if he wants to consider the pacemaker/defibrillator, he should really go through them if he is going there. He really shouldn't be going back and forth.   If he has not been to them in some time, can consider sending him back to Dr. Acie Fredrickson. Will also have to check to see which one was thinking about having him do that.

## 2017-10-30 NOTE — Telephone Encounter (Signed)
To Dr. Mulberry for further direction 

## 2017-10-30 NOTE — Progress Notes (Signed)
Patient ID: Kyle Burke, male   DOB: [birth year], 72 y.o.   MRN: [id number]   Patient now with [facility] financial assistance: Will work on obtaining vascular evaluation of bilateral lower legs with what sounds like PAD last visit. Ortho for chronic knee pain after obtaining xrays. For the Cardiac concerns:  He needs to continue with [facility].   Will check with [facility] on how likely they feel he will be able to obtain financial assistance for the synchronizing pacemaker/defibrillator.

## 2017-11-10 ENCOUNTER — Ambulatory Visit (INDEPENDENT_AMBULATORY_CARE_PROVIDER_SITE_OTHER): Payer: Self-pay

## 2017-11-10 ENCOUNTER — Ambulatory Visit (INDEPENDENT_AMBULATORY_CARE_PROVIDER_SITE_OTHER): Payer: Self-pay | Admitting: Orthopaedic Surgery

## 2017-11-10 ENCOUNTER — Encounter (INDEPENDENT_AMBULATORY_CARE_PROVIDER_SITE_OTHER): Payer: Self-pay | Admitting: Orthopaedic Surgery

## 2017-11-10 VITALS — BP 109/72 | HR 72 | Resp 18 | Ht 75.0 in | Wt 210.0 lb

## 2017-11-10 DIAGNOSIS — M25562 Pain in left knee: Secondary | ICD-10-CM

## 2017-11-10 DIAGNOSIS — M25561 Pain in right knee: Secondary | ICD-10-CM

## 2017-11-10 DIAGNOSIS — R0989 Other specified symptoms and signs involving the circulatory and respiratory systems: Secondary | ICD-10-CM

## 2017-11-10 NOTE — Progress Notes (Signed)
Office Visit Note   Patient: Kyle Burke           Date of Birth: 1946/01/23           MRN: [id number] Visit Date: 11/10/2017              Requested by: Mack Hook, MD [place], [place] [id number] PCP: Mack Hook, MD   Assessment & Plan: Visit Diagnoses:  1. Acute pain of left knee   2. Acute pain of right knee   3. Decreased pulses in feet     Plan:  #1: At this time are going to have him seen by the vascular group to evaluate his arterial system in both legs to rule out claudication for his symptoms especially since the fact that we have had decreased pulses on Doppler his symptoms may be vascular and not necessarily arthritic. #2: Depending upon those results we can certainly give him a corticosteroid injection to the knees decreasing his Depo-Medrol to 60 mg and only one knee at a time.  The watch his glucoses.  Follow-Up Instructions: Return in about 3 weeks (around 12/01/2017).   Face-to-face time spent with patient was greater than 30 minutes.  Greater than 50% of the time was spent in counseling and coordination of care.  Orders:  Orders Placed This Encounter  Procedures  . XR KNEE 3 VIEW LEFT  . XR KNEE 3 VIEW RIGHT  . Ambulatory referral to Vascular Surgery   No orders of the defined types were placed in this encounter.     Procedures: No procedures performed   Clinical Data: No additional findings.   Subjective: Chief Complaint  Patient presents with  . Left Knee - Pain  . Right Knee - Pain  . New Patient (Initial Visit)    BIL LAT KNEE PAIN AND BACK OF CALF MUSCLES PAIN AND CRAMPS  FOR 3-4 MONTHS,NO INJURY, NO INJECTIONS OR PRIOP XRAYS    HPI  Kyle Burke is a very pleasant 72 year old male who presents today with a history of bilateral posterior calf pain.  This initially started in the right leg and then into the left leg.  He has had about a 5-89-month history of this pain is much in the knee as it is in the  posterior calf bilaterally.  He states that he is up and walking the pain does get worse.  He stops walking and then he has resolution in about 1/2-hour after sitting.  A recent history of injury or trauma.  He is a diabetic.  He does have congestive heart failure.  He is on Xarelto.  Denies any back pain.  Denies radicular type symptoms.  Review of Systems  Constitutional: Positive for fatigue. Negative for fever.  HENT: Negative for ear pain.   Eyes: Negative for pain.  Respiratory: Negative for cough and shortness of breath.   Cardiovascular: Positive for leg swelling.  Gastrointestinal: Negative for constipation, diarrhea and nausea.  Genitourinary: Negative for difficulty urinating.  Musculoskeletal: Negative for back pain and neck pain.  Skin: Negative for rash and wound.  Allergic/Immunologic: Negative for food allergies.  Neurological: Positive for weakness. Negative for dizziness, light-headedness, numbness and headaches.  Hematological: Bruises/bleeds easily.  Psychiatric/Behavioral: Negative for sleep disturbance.     Objective: Vital Signs: BP 109/72 (BP Location: Left Arm, Patient Position: Sitting, Cuff Size: Normal)   Pulse 72   Resp 18   Ht 6\' 3"  (1.905 m)   Wt 210 lb (95.3 kg)  BMI 26.25 kg/m   Physical Exam  Constitutional: He is oriented to person, place, and time. He appears well-developed and well-nourished.  HENT:  Head: Normocephalic.  Eyes: Pupils are equal, round, and reactive to light.  Pulmonary/Chest: Effort normal.  Neurological: He is alert and oriented to person, place, and time.  Skin: Skin is warm and dry.  Psychiatric: He has a normal mood and affect. His behavior is normal. Judgment and thought content normal.    Ortho Exam  Exam today reveals varus deformities of both knees.  He does have pseudolaxity with valgus stressing but has a good endpoint.  He certainly does have crepitance with range of motion patellofemoral.  There were  decreased pulses in both lower extremities. I used a Doppler which revealed dorsalis pedis was stronger than minimal posterior tibial in both legs.  Still markedly decreased bilaterally.  Specialty Comments:  No specialty comments available.  Imaging: Xr Knee 3 View Left  Result Date: 11/10/2017 Three-view x-ray of the left knee reveals narrowing of the medial joint space.  Loose bodies are noted.  Periarticular spurs on the medial distal femoral condyle and also at the lateral tibial plateau.  Sclerosing of the proximal tibial tibia medially.  Marked patellofemoral degenerative changes and periarticular spurring.  There is a large what appears to be posterior loose body noted.  Xr Knee 3 View Right  Result Date: 11/10/2017 Three-view x-ray of the right knee reveals marked near bone-on-bone medial compartment OA.  Periarticular spurring both medially and laterally.  Does have periarticular spurring in the patellofemoral joint also.  Marked irregularity of the femoral condyles on the lateral.  He has several loose bodies noted posteriorly and one in the superior knee.    PMFS History: Patient Active Problem List   Diagnosis Date Noted  . Decreased pulses in feet 11/10/2017  . OA (osteoarthritis) of knee 01/08/2017  . Mild CAD 08/12/2016  . Benign neoplasm of descending colon   . Benign neoplasm of transverse colon   . Heme + stool   . Gastritis and gastroduodenitis   . Essential hypertension 06/15/2015  . Normocytic anemia 04/07/2015  . Controlled type 2 diabetes mellitus with stage 3 chronic kidney disease, without long-term current use of insulin ([place])   . Dyslipidemia   . Microalbuminuria due to type 2 diabetes mellitus ([place])   . Chronic systolic CHF (congestive heart failure) ([place]) 12/15/2014  . Atrial fibrillation ([place]) 09/06/2014  . HTN (hypertension) 09/06/2014  . LBBB (left bundle branch block) 09/06/2014   Past Medical History:  Diagnosis Date  . Atrial fibrillation  ([place]) 2016  . CAD (coronary artery disease)   . Diabetes mellitus 2001  . Hypertension 2006  . Idiopathic cardiomyopathy ([place]) 11/01/2014   Followed by Dr. Acie Fredrickson, Cardiology  EF 25%  Adenosine Cardiolite did not support ischemic etiology  . OA (osteoarthritis) of knee 01/08/2017  . Pleural effusion     Family History  Problem Relation Age of Onset  . Stroke Mother   . Heart disease Mother   . Heart disease Father   . Diabetes Father   . Kidney disease Father        , history of stones as well  . Diabetes Brother   . Cancer Sister 60       unknown soft tissue cancer of leg    Past Surgical History:  Procedure Laterality Date  . COLONOSCOPY WITH PROPOFOL N/A 05/14/2016   Procedure: COLONOSCOPY WITH PROPOFOL;  Surgeon: Jerene Bears, MD;  Location: [facility];  Service: Gastroenterology;  Laterality: N/A;  . ESOPHAGOGASTRODUODENOSCOPY (EGD) WITH PROPOFOL N/A 05/14/2016   Procedure: ESOPHAGOGASTRODUODENOSCOPY (EGD) WITH PROPOFOL;  Surgeon: Jerene Bears, MD;  Location: [facility];  Service: Gastroenterology;  Laterality: N/A;  . HERNIA REPAIR    . INCISE AND DRAIN ABCESS  1986   on leg  . INGUINAL HERNIA REPAIR  01/16/2012   Procedure: LAPAROSCOPIC INGUINAL HERNIA;  Surgeon: Gayland Curry, MD,FACS;  Location: [facility];  Service: General;  Laterality: Left;  Laparoscopic incarcerated Left inguinal hernia repair with mesh, umbilical hernia repair  . UMBILICAL HERNIA REPAIR  01/16/2012   Procedure: HERNIA REPAIR UMBILICAL ADULT;  Surgeon: Gayland Curry, MD,FACS;  Location: [facility];  Service: General;  Laterality: N/A;   Social History   Occupational History  . Occupation: Ambulance person constrution    Comment: Barista  Tobacco Use  . Smoking status: Never Smoker  . Smokeless tobacco: Never Used  Substance and Sexual Activity  . Alcohol use: No    Alcohol/week: 0.0 oz    Comment: rare occ., formerly more  . Drug use: No  . Sexual activity: Yes    Comment: But not regular

## 2017-11-12 ENCOUNTER — Other Ambulatory Visit: Payer: Self-pay

## 2017-11-12 DIAGNOSIS — R0989 Other specified symptoms and signs involving the circulatory and respiratory systems: Secondary | ICD-10-CM

## 2017-11-27 ENCOUNTER — Ambulatory Visit (INDEPENDENT_AMBULATORY_CARE_PROVIDER_SITE_OTHER): Payer: Self-pay | Admitting: Orthopaedic Surgery

## 2017-12-31 ENCOUNTER — Inpatient Hospital Stay (HOSPITAL_COMMUNITY): Admission: RE | Admit: 2017-12-31 | Payer: Self-pay | Source: Ambulatory Visit

## 2017-12-31 ENCOUNTER — Encounter: Payer: Self-pay | Admitting: Vascular Surgery

## 2018-01-01 NOTE — Progress Notes (Signed)
Finally received a response from [facility]. Financial assistance just reached out to them regarding [name]. States they are now reviewing his financial assistance application as of 2 days ago. Will let patient know if he is approved within next few weeks to go ahead and see his cardiologist to have procedure done.

## 2018-01-04 ENCOUNTER — Ambulatory Visit (INDEPENDENT_AMBULATORY_CARE_PROVIDER_SITE_OTHER): Payer: Self-pay | Admitting: Orthopaedic Surgery

## 2018-01-20 ENCOUNTER — Other Ambulatory Visit: Payer: Self-pay

## 2018-01-20 DIAGNOSIS — R0989 Other specified symptoms and signs involving the circulatory and respiratory systems: Secondary | ICD-10-CM

## 2018-02-10 ENCOUNTER — Ambulatory Visit (INDEPENDENT_AMBULATORY_CARE_PROVIDER_SITE_OTHER): Payer: Self-pay | Admitting: Vascular Surgery

## 2018-02-10 ENCOUNTER — Ambulatory Visit (HOSPITAL_COMMUNITY)
Admission: RE | Admit: 2018-02-10 | Discharge: 2018-02-10 | Disposition: A | Discharge status: Home / Self Care | Payer: Self-pay | Source: Ambulatory Visit | Attending: Vascular Surgery | Admitting: Vascular Surgery

## 2018-02-10 ENCOUNTER — Encounter: Payer: Self-pay | Admitting: Vascular Surgery

## 2018-02-10 ENCOUNTER — Other Ambulatory Visit: Payer: Self-pay

## 2018-02-10 VITALS — BP 102/65 | HR 58 | Resp 18 | Ht 75.0 in | Wt 224.0 lb

## 2018-02-10 DIAGNOSIS — E119 Type 2 diabetes mellitus without complications: Secondary | ICD-10-CM | POA: Insufficient documentation

## 2018-02-10 DIAGNOSIS — I872 Venous insufficiency (chronic) (peripheral): Secondary | ICD-10-CM

## 2018-02-10 DIAGNOSIS — R0989 Other specified symptoms and signs involving the circulatory and respiratory systems: Secondary | ICD-10-CM | POA: Insufficient documentation

## 2018-02-10 DIAGNOSIS — I10 Essential (primary) hypertension: Secondary | ICD-10-CM | POA: Insufficient documentation

## 2018-02-10 DIAGNOSIS — I2510 Atherosclerotic heart disease of native coronary artery without angina pectoris: Secondary | ICD-10-CM | POA: Insufficient documentation

## 2018-02-10 NOTE — Progress Notes (Signed)
Patient name: Kyle Burke MRN: [id number] DOB: Dec 19, 1945 Sex: male   REASON FOR CONSULT:    Decreased pedal pulses.  The consult is requested by Dr. Joni Fears.  HPI:   Kyle Burke is a pleasant 72 y.o. male, referred with bilateral leg pain.  Of note, the history is obtained through his translator.  He describes a one year history of gradual onset of pain in both legs which involves his calves.  His symptoms are worse during the day when he is standing.  The symptoms are sometimes aggravated by walking and relieved when he sits down.  He has not worn compression stockings.  He does not elevate his legs.  He denies any previous history of DVT.  I do not get any history of claudication, rest pain, or nonhealing ulcers.  I have reviewed the records from the referring office.  The patient was seen on 11/10/2017.  The patient was complaining of bilateral posterior calf pain.  This initially started on the right side but then also involve the left leg.  This is been going on for about 5 to 6 months and he was also noting pain in both knees.  His symptoms would resolve after stopping activity for about a half an hour.  He denies any recent injury or trauma.  Patient does have a history of congestive heart failure.  He is on Xarelto.  At the time of his last visit he was noted to have decreased pulses in his feet and he set up for vascular consultation.  Past Medical History:  Diagnosis Date  . Atrial fibrillation ([place]) 2016  . CAD (coronary artery disease)   . Diabetes mellitus 2001  . Hypertension 2006  . Idiopathic cardiomyopathy ([place]) 11/01/2014   Followed by Dr. Acie Fredrickson, Cardiology  EF 25%  Adenosine Cardiolite did not support ischemic etiology  . OA (osteoarthritis) of knee 01/08/2017  . Pleural effusion     Family History  Problem Relation Age of Onset  . Stroke Mother   . Heart disease Mother   . Heart disease Father   . Diabetes Father   . Kidney disease  Father        , history of stones as well  . Diabetes Brother   . Cancer Sister 57       unknown soft tissue cancer of leg    SOCIAL HISTORY: Social History   Socioeconomic History  . Marital status: Married    Spouse name: Not on file  . Number of children: 5  . Years of education: 9  . Highest education level: Not on file  Occupational History  . Occupation: Ambulance person constrution    Comment: Barista  Social Needs  . Financial resource strain: Not on file  . Food insecurity:    Worry: Not on file    Inability: Not on file  . Transportation needs:    Medical: Not on file    Non-medical: Not on file  Tobacco Use  . Smoking status: Never Smoker  . Smokeless tobacco: Never Used  Substance and Sexual Activity  . Alcohol use: No    Alcohol/week: 0.0 oz    Comment: rare occ., formerly more  . Drug use: No  . Sexual activity: Yes    Comment: But not regular  Lifestyle  . Physical activity:    Days per week: Not on file    Minutes per session: Not on file  . Stress: Not on file  Relationships  .  Social connections:    Talks on phone: Not on file    Gets together: Not on file    Attends religious service: Not on file    Active member of club or organization: Not on file    Attends meetings of clubs or organizations: Not on file    Relationship status: Not on file  . Intimate partner violence:    Fear of current or ex partner: Not on file    Emotionally abused: Not on file    Physically abused: Not on file    Forced sexual activity: Not on file  Other Topics Concern  . Not on file  Social History Narrative   Originally from [place]   Came to [place]. In 1995   Lives at home with wife, a son and his wife and 3 grandchildren.    No Known Allergies  Current Outpatient Medications  Medication Sig Dispense Refill  . acetaminophen (TYLENOL) 650 MG CR tablet Take 650 mg by mouth every 8 (eight) hours as needed for pain. Reported on 02/14/2016    . atorvastatin  (LIPITOR) 80 MG tablet Take 1 tablet (80 mg total) by mouth daily. 30 tablet 11  . Blood Glucose Monitoring Suppl (AGAMATRIX PRESTO PRO METER) DEVI Twice daily blood glucose checks before meals 1 Device 0  . carvedilol (COREG) 3.125 MG tablet Take 1 tablet (3.125 mg total) by mouth 2 (two) times daily with a meal. 60 tablet 11  . ferrous gluconate (FERGON) 324 MG tablet Take 1 tablet (324 mg total) by mouth daily with breakfast.  3  . furosemide (LASIX) 40 MG tablet 2 tabs by mouth twice daily 120 tablet 11  . glucose blood (AGAMATRIX PRESTO TEST) test strip Twice daily glucose checks before meals 100 each 12  . Menthol-Camphor 3-3 % GEL Apply to knees as needed for pain  0  . metFORMIN (GLUCOPHAGE) 500 MG tablet Take 1 tablet (500 mg total) by mouth 2 (two) times daily with a meal. 60 tablet 11  . potassium chloride (K-DUR) 10 MEQ tablet Take 1 tablet (10 mEq total) by mouth daily. 30 tablet 11  . XARELTO 20 MG TABS tablet TAKE 1 TABLET BY MOUTH ONCE DAILY WITH SUPPER 30 tablet 11  . amoxicillin-clavulanate (AUGMENTIN) 875-125 MG tablet 1 tab by mouth twice daily 10 days (Patient not taking: Reported on 02/10/2018) 20 tablet 0   No current facility-administered medications for this visit.     REVIEW OF SYSTEMS:  [X]  denotes positive finding, [ ]  denotes negative finding Cardiac  Comments:  Chest pain or chest pressure:    Shortness of breath upon exertion: x   Short of breath when lying flat:    Irregular heart rhythm:        Vascular    Pain in calf, thigh, or hip brought on by ambulation: x   Pain in feet at night that wakes you up from your sleep:     Blood clot in your veins:    Leg swelling:  x       Pulmonary    Oxygen at home:    Productive cough:     Wheezing:         Neurologic    Sudden weakness in arms or legs:     Sudden numbness in arms or legs:     Sudden onset of difficulty speaking or slurred speech:    Temporary loss of vision in one eye:     Problems with  dizziness:  Gastrointestinal    Blood in stool:     Vomited blood:         Genitourinary    Burning when urinating:     Blood in urine:        Psychiatric    Major depression:         Hematologic    Bleeding problems:    Problems with blood clotting too easily:        Skin    Rashes or ulcers:        Constitutional    Fever or chills:     PHYSICAL EXAM:   Vitals:   02/10/18 1443  BP: 102/65  Pulse: (!) 58  Resp: 18  SpO2: 97%  Weight: 224 lb (101.6 kg)  Height: 6\' 3"  (1.905 m)    GENERAL: The patient is a well-nourished male, in no acute distress. The vital signs are documented above. CARDIAC: There is a regular rate and rhythm.  VASCULAR: I do not detect carotid bruits. On the right side he has a palpable femoral, popliteal, and posterior tibial pulse.  I cannot palpate a dorsalis pedis pulse. On the left side, he has a palpable femoral, popliteal, dorsalis pedis, and posterior tibial pulse. He has varicose veins bilaterally.  He has some reticular veins and spider veins bilaterally.  He has hyperpigmentation bilaterally. Currently he has no significant lower extremity swelling. PULMONARY: There is good air exchange bilaterally without wheezing or rales. ABDOMEN: Soft and non-tender with normal pitched bowel sounds.  MUSCULOSKELETAL: There are no major deformities or cyanosis. NEUROLOGIC: No focal weakness or paresthesias are detected. SKIN: There are no ulcers or rashes noted. PSYCHIATRIC: The patient has a normal affect.  DATA:    ARTERIAL DOPPLER STUDY: I have independently interpreted his lower extremity arterial Doppler study.  On the right side there is a triphasic dorsalis pedis and triphasic posterior tibial signal.  ABI on the right is 100%.  Toe pressure on the right is 103 mmHg.    On the left side there is a triphasic dorsalis pedis and posterior tibial signal.  ABI is 100%.  Toe pressure on the left is 103 mmHg.  MEDICAL ISSUES:    CHRONIC VENOUS INSUFFICIENCY: Based on his history and exam I think he has evidence of chronic venous insufficiency.  He has aching pain which is worsened with standing.  He has hyperpigmentation, varicose veins, and spider veins bilaterally.  I have discussed with him the importance of intermittent leg elevation and the proper positioning for this.  I have written him a prescription for knee-high compression stockings with a gradient of 15 to 20 mmHg.  I have encouraged him to avoid prolonged sitting and standing.  I have encouraged him to exercise and walk as much as possible.  If his symptoms progressed and certainly we could consider a more extensive venous work-up.  He knows to call if his symptoms worsen.  I did reassure him that he has no evidence of significant arterial insufficiency.  I also explained that some of his pain in his knees certainly could be attributed to arthritis.  Deitra Mayo [facility] [phone]

## 2018-02-19 ENCOUNTER — Other Ambulatory Visit: Payer: Self-pay | Admitting: Internal Medicine

## 2018-02-19 MED ORDER — METFORMIN HCL 500 MG PO TABS: 500.0000 mg | ORAL_TABLET | Freq: Two times a day (BID) | ORAL | 6 refills | Status: DC

## 2018-02-19 NOTE — Telephone Encounter (Signed)
Patient called requesting Rx on metFORMIN (GLUCOPHAGE) 500 MG tablet.  Please advise.

## 2018-02-23 NOTE — Telephone Encounter (Signed)
Spoke to pt. States he has picked up medication.

## 2018-03-26 ENCOUNTER — Telehealth: Payer: Self-pay | Admitting: Internal Medicine

## 2018-03-26 DIAGNOSIS — M170 Bilateral primary osteoarthritis of knee: Secondary | ICD-10-CM

## 2018-03-26 NOTE — Telephone Encounter (Signed)
Zahki would like referral to PT for knee pain

## 2018-03-28 NOTE — Telephone Encounter (Signed)
Referral written.  Please send

## 2018-03-28 NOTE — Telephone Encounter (Signed)
Request for PT for knee pain

## 2018-03-29 NOTE — Telephone Encounter (Signed)
Referral faxed to [facility]. Facility ill contact patient to schedule appointment.

## 2018-06-11 ENCOUNTER — Ambulatory Visit: Payer: Self-pay | Admitting: Internal Medicine

## 2018-06-11 ENCOUNTER — Encounter: Payer: Self-pay | Admitting: Internal Medicine

## 2018-06-11 ENCOUNTER — Telehealth: Payer: Self-pay | Admitting: Internal Medicine

## 2018-06-11 VITALS — BP 110/80 | HR 66 | Resp 12 | Ht 71.0 in | Wt 229.0 lb

## 2018-06-11 DIAGNOSIS — K4090 Unilateral inguinal hernia, without obstruction or gangrene, not specified as recurrent: Secondary | ICD-10-CM

## 2018-06-11 NOTE — Telephone Encounter (Signed)
Spoke with Abigail Butts from [facility]. Per Dr. Redmond Pulling he will see [name] for this hernia since it has come back. States he needs to pay $166.60 for his visit.  Patient scheduled for appointment on 06/30/18 @ 9 am. patient to arrive @ 8:30 am.   Abigail Butts spoke with billing and they would like a referral sent to [facility] so that in January he can be one of the first orange card patients to have surgery. Informed if patient has severe pain, nausea and vomiting then he needs to go to emergency immediately because he could be having complications from the hernia.  [name] spoke with patient and informed him of cost and appointment date and time. Patient verbalized understanding. To Dr. Amil Amen to put in referral for general surgery.

## 2018-06-11 NOTE — Addendum Note (Signed)
Addended by: Marcelino Duster on: 06/11/2018 04:40 PM   Modules accepted: Orders

## 2018-06-11 NOTE — Progress Notes (Signed)
   Subjective:    Patient ID: Kyle Burke, male    DOB: [birth year], 72 y.o.   MRN: [id number]  HPI   1. Left hernia:  History of hernia repair in same area in May 2013 by Dr Greer Pickerel.  He does not remember this suddenly recurring, just started noticing more and more.   He was smoothing cement that had been poured when he first noted.  He noted a little ball in the groin that came and went, but now in past 15 days, quite large and stays out all the time, does not go back down.   No associated pain.   No problem passing gas or stool.   Current Meds  Medication Sig  . acetaminophen (TYLENOL) 650 MG CR tablet Take 650 mg by mouth every 8 (eight) hours as needed for pain. Reported on 02/14/2016  . atorvastatin (LIPITOR) 80 MG tablet Take 1 tablet (80 mg total) by mouth daily.  . Blood Glucose Monitoring Suppl (AGAMATRIX PRESTO PRO METER) DEVI Twice daily blood glucose checks before meals  . carvedilol (COREG) 3.125 MG tablet Take 1 tablet (3.125 mg total) by mouth 2 (two) times daily with a meal.  . ferrous gluconate (FERGON) 324 MG tablet Take 1 tablet (324 mg total) by mouth daily with breakfast.  . furosemide (LASIX) 40 MG tablet 2 tabs by mouth twice daily  . glucose blood (AGAMATRIX PRESTO TEST) test strip Twice daily glucose checks before meals  . Menthol-Camphor 3-3 % GEL Apply to knees as needed for pain  . metFORMIN (GLUCOPHAGE) 500 MG tablet Take 1 tablet (500 mg total) by mouth 2 (two) times daily with a meal.  . potassium chloride (K-DUR) 10 MEQ tablet Take 1 tablet (10 mEq total) by mouth daily.  Alveda Reasons 20 MG TABS tablet TAKE 1 TABLET BY MOUTH ONCE DAILY WITH SUPPER    No Known Allergies   Review of Systems     Objective:   Physical Exam NAD Abd:  S, NT, No HSM or mass, + BS GU:  Very large mass in left groin down into left scrotum.  Unable to palpate opening of hernia.  No overlying discoloration, NT, but quite firm.       Assessment & Plan:    Nonreducible hernia vs large hydrocele. Call into Dr Greer Pickerel at [facility] to see if willing to reevaluate. He is currently in surgery. If not able to get him in, will send for ultrasound of mass to clarify, though would prefer surgical evaluation.  HM:  Flu vaccine Rx given for [facility].

## 2018-06-11 NOTE — Patient Instructions (Signed)
Flu vaccine at [facility] on [address] given

## 2018-06-11 NOTE — Telephone Encounter (Signed)
Referral made. Please document the information regarding signs and symptoms patient notified to watch for to go to ED.

## 2018-06-11 NOTE — Telephone Encounter (Signed)
[name] called patient and informed patient has to go immediately to emergency room if has severe pain , nausea and vomiting because could be complications from the hernia.  Patient agreed and verbalized understanding.

## 2018-06-18 ENCOUNTER — Emergency Department (HOSPITAL_COMMUNITY): Payer: Medicaid Other

## 2018-06-18 ENCOUNTER — Encounter (HOSPITAL_COMMUNITY): Payer: Self-pay | Admitting: Emergency Medicine

## 2018-06-18 ENCOUNTER — Inpatient Hospital Stay (HOSPITAL_COMMUNITY)
Admission: EM | Admit: 2018-06-18 | Discharge: 2018-06-23 | DRG: 291 | Disposition: A | Discharge status: Home / Self Care | Payer: Medicaid Other | Attending: Internal Medicine | Admitting: Internal Medicine

## 2018-06-18 ENCOUNTER — Other Ambulatory Visit: Payer: Self-pay

## 2018-06-18 DIAGNOSIS — N183 Chronic kidney disease, stage 3 unspecified: Secondary | ICD-10-CM | POA: Diagnosis present

## 2018-06-18 DIAGNOSIS — I5022 Chronic systolic (congestive) heart failure: Secondary | ICD-10-CM

## 2018-06-18 DIAGNOSIS — D6489 Other specified anemias: Secondary | ICD-10-CM | POA: Diagnosis present

## 2018-06-18 DIAGNOSIS — N179 Acute kidney failure, unspecified: Secondary | ICD-10-CM | POA: Diagnosis present

## 2018-06-18 DIAGNOSIS — I5082 Biventricular heart failure: Secondary | ICD-10-CM | POA: Diagnosis present

## 2018-06-18 DIAGNOSIS — I509 Heart failure, unspecified: Secondary | ICD-10-CM

## 2018-06-18 DIAGNOSIS — I959 Hypotension, unspecified: Secondary | ICD-10-CM | POA: Diagnosis present

## 2018-06-18 DIAGNOSIS — I5021 Acute systolic (congestive) heart failure: Secondary | ICD-10-CM | POA: Diagnosis present

## 2018-06-18 DIAGNOSIS — Z95828 Presence of other vascular implants and grafts: Secondary | ICD-10-CM

## 2018-06-18 DIAGNOSIS — E876 Hypokalemia: Secondary | ICD-10-CM | POA: Diagnosis present

## 2018-06-18 DIAGNOSIS — Z7901 Long term (current) use of anticoagulants: Secondary | ICD-10-CM

## 2018-06-18 DIAGNOSIS — I130 Hypertensive heart and chronic kidney disease with heart failure and stage 1 through stage 4 chronic kidney disease, or unspecified chronic kidney disease: Principal | ICD-10-CM | POA: Diagnosis present

## 2018-06-18 DIAGNOSIS — I2510 Atherosclerotic heart disease of native coronary artery without angina pectoris: Secondary | ICD-10-CM | POA: Diagnosis present

## 2018-06-18 DIAGNOSIS — R0602 Shortness of breath: Secondary | ICD-10-CM | POA: Diagnosis present

## 2018-06-18 DIAGNOSIS — Z833 Family history of diabetes mellitus: Secondary | ICD-10-CM | POA: Diagnosis not present

## 2018-06-18 DIAGNOSIS — I4821 Permanent atrial fibrillation: Secondary | ICD-10-CM | POA: Diagnosis present

## 2018-06-18 DIAGNOSIS — M1710 Unilateral primary osteoarthritis, unspecified knee: Secondary | ICD-10-CM | POA: Diagnosis present

## 2018-06-18 DIAGNOSIS — I4891 Unspecified atrial fibrillation: Secondary | ICD-10-CM | POA: Diagnosis present

## 2018-06-18 DIAGNOSIS — E1122 Type 2 diabetes mellitus with diabetic chronic kidney disease: Secondary | ICD-10-CM | POA: Diagnosis present

## 2018-06-18 DIAGNOSIS — K5900 Constipation, unspecified: Secondary | ICD-10-CM | POA: Diagnosis present

## 2018-06-18 DIAGNOSIS — D696 Thrombocytopenia, unspecified: Secondary | ICD-10-CM | POA: Diagnosis present

## 2018-06-18 DIAGNOSIS — I10 Essential (primary) hypertension: Secondary | ICD-10-CM | POA: Diagnosis present

## 2018-06-18 DIAGNOSIS — I25811 Atherosclerosis of native coronary artery of transplanted heart without angina pectoris: Secondary | ICD-10-CM | POA: Diagnosis present

## 2018-06-18 DIAGNOSIS — Z7984 Long term (current) use of oral hypoglycemic drugs: Secondary | ICD-10-CM | POA: Diagnosis not present

## 2018-06-18 DIAGNOSIS — Z8249 Family history of ischemic heart disease and other diseases of the circulatory system: Secondary | ICD-10-CM | POA: Diagnosis not present

## 2018-06-18 DIAGNOSIS — Z79899 Other long term (current) drug therapy: Secondary | ICD-10-CM | POA: Diagnosis not present

## 2018-06-18 DIAGNOSIS — I428 Other cardiomyopathies: Secondary | ICD-10-CM | POA: Diagnosis present

## 2018-06-18 DIAGNOSIS — Z941 Heart transplant status: Secondary | ICD-10-CM | POA: Diagnosis not present

## 2018-06-18 DIAGNOSIS — I5023 Acute on chronic systolic (congestive) heart failure: Secondary | ICD-10-CM | POA: Diagnosis present

## 2018-06-18 DIAGNOSIS — E785 Hyperlipidemia, unspecified: Secondary | ICD-10-CM | POA: Diagnosis present

## 2018-06-18 DIAGNOSIS — R109 Unspecified abdominal pain: Secondary | ICD-10-CM

## 2018-06-18 DIAGNOSIS — I447 Left bundle-branch block, unspecified: Secondary | ICD-10-CM | POA: Diagnosis present

## 2018-06-18 LAB — CBC
HEMATOCRIT: 40.4 % (ref 39.0–52.0)
Hemoglobin: 13.1 g/dL (ref 13.0–17.0)
MCH: 30.5 pg (ref 26.0–34.0)
MCHC: 32.4 g/dL (ref 30.0–36.0)
MCV: 94.2 fL (ref 80.0–100.0)
Platelets: 156 10*3/uL (ref 150–400)
RBC: 4.29 MIL/uL (ref 4.22–5.81)
RDW: 14.6 % (ref 11.5–15.5)
WBC: 6.6 10*3/uL (ref 4.0–10.5)
nRBC: 0 % (ref 0.0–0.2)

## 2018-06-18 LAB — BASIC METABOLIC PANEL
Anion gap: 7 (ref 5–15)
BUN: 29 mg/dL — AB (ref 8–23)
CO2: 28 mmol/L (ref 22–32)
Calcium: 9 mg/dL (ref 8.9–10.3)
Chloride: 97 mmol/L — ABNORMAL LOW (ref 98–111)
Creatinine, Ser: 1.45 mg/dL — ABNORMAL HIGH (ref 0.61–1.24)
GFR calc Af Amer: 54 mL/min — ABNORMAL LOW (ref >60)
GFR calc non Af Amer: 47 mL/min — ABNORMAL LOW (ref >60)
GLUCOSE: 169 mg/dL — AB (ref 70–99)
Potassium: 3.9 mmol/L (ref 3.5–5.1)
Sodium: 132 mmol/L — ABNORMAL LOW (ref 135–145)

## 2018-06-18 LAB — BRAIN NATRIURETIC PEPTIDE: B Natriuretic Peptide: >4500 pg/mL — ABNORMAL HIGH (ref 0.0–100.0)

## 2018-06-18 LAB — I-STAT TROPONIN, ED: Troponin i, poc: 0.03 ng/mL (ref 0.00–0.08)

## 2018-06-18 MED ORDER — POTASSIUM CHLORIDE CRYS ER 10 MEQ PO TBCR
10.0000 meq | EXTENDED_RELEASE_TABLET | Freq: Every day | ORAL | Status: DC
  Administered 2018-06-18 – 2018-06-21 (×4): 10 meq via ORAL
  Filled 2018-06-18 (×4): qty 1

## 2018-06-18 MED ORDER — ONDANSETRON HCL 4 MG/2ML IJ SOLN: 4.0000 mg | Freq: Four times a day (QID) | INTRAMUSCULAR | Status: DC | PRN

## 2018-06-18 MED ORDER — ATORVASTATIN CALCIUM 80 MG PO TABS
80.0000 mg | ORAL_TABLET | Freq: Every day | ORAL | Status: DC
  Administered 2018-06-18 – 2018-06-22 (×5): 80 mg via ORAL
  Filled 2018-06-18 (×5): qty 1

## 2018-06-18 MED ORDER — INSULIN ASPART 100 UNIT/ML ~~LOC~~ SOLN
0.0000 [IU] | Freq: Three times a day (TID) | SUBCUTANEOUS | Status: DC
  Administered 2018-06-20 – 2018-06-22 (×6): 1 [IU] via SUBCUTANEOUS

## 2018-06-18 MED ORDER — FUROSEMIDE 10 MG/ML IJ SOLN
40.0000 mg | Freq: Two times a day (BID) | INTRAMUSCULAR | Status: DC
  Administered 2018-06-19 – 2018-06-21 (×4): 40 mg via INTRAVENOUS
  Filled 2018-06-18 (×5): qty 4

## 2018-06-18 MED ORDER — FUROSEMIDE 10 MG/ML IJ SOLN
80.0000 mg | Freq: Once | INTRAMUSCULAR | Status: AC
  Administered 2018-06-18: 80 mg via INTRAVENOUS
  Filled 2018-06-18: qty 8

## 2018-06-18 MED ORDER — ONDANSETRON HCL 4 MG PO TABS: 4.0000 mg | ORAL_TABLET | Freq: Four times a day (QID) | ORAL | Status: DC | PRN

## 2018-06-18 MED ORDER — CARVEDILOL 3.125 MG PO TABS
3.1250 mg | ORAL_TABLET | Freq: Two times a day (BID) | ORAL | Status: DC
  Administered 2018-06-18 – 2018-06-23 (×9): 3.125 mg via ORAL
  Filled 2018-06-18 (×10): qty 1

## 2018-06-18 MED ORDER — POTASSIUM CHLORIDE ER 10 MEQ PO TBCR: 10.0000 meq | EXTENDED_RELEASE_TABLET | Freq: Every day | ORAL | Status: DC

## 2018-06-18 MED ORDER — RIVAROXABAN 20 MG PO TABS
20.0000 mg | ORAL_TABLET | Freq: Every day | ORAL | Status: DC
  Administered 2018-06-18 – 2018-06-22 (×5): 20 mg via ORAL
  Filled 2018-06-18 (×6): qty 1

## 2018-06-18 MED ORDER — ACETAMINOPHEN 650 MG RE SUPP: 650.0000 mg | Freq: Four times a day (QID) | RECTAL | Status: DC | PRN

## 2018-06-18 MED ORDER — FERROUS GLUCONATE 324 (38 FE) MG PO TABS
324.0000 mg | ORAL_TABLET | Freq: Every day | ORAL | Status: DC
  Administered 2018-06-19 – 2018-06-23 (×5): 324 mg via ORAL
  Filled 2018-06-18 (×5): qty 1

## 2018-06-18 MED ORDER — ACETAMINOPHEN 325 MG PO TABS: 650.0000 mg | ORAL_TABLET | Freq: Four times a day (QID) | ORAL | Status: DC | PRN

## 2018-06-18 NOTE — ED Provider Notes (Signed)
Patient placed in Quick Look pathway, seen and evaluated   Chief Complaint: Constipation  HPI:   Patient with a history of heart transplant 2010 and diabetes.  States she has been constipated and hasn't had a bowel movement in 6d. States "feels like rectum is trying to come out." Has not tried any laxatives, has had to use MiraLAX, but states it's been over a year since taking this. No prior abdominal surgeries. Not sure if she can pass gas. No fever or abdominal pain.    ROS: No abd pain  Physical Exam:   Gen: No distress  Neuro: Awake and Alert  Skin: Warm    Focused Exam: Abdomen soft, nondistended and nontender to palpation.   Initiation of care has begun. The patient has been counseled on the process, plan, and necessity for staying for the completion/evaluation, and the remainder of the medical screening examination    Bernarda Caffey 06/18/18 1755    Charlesetta Shanks, MD 06/24/18 1601

## 2018-06-18 NOTE — ED Notes (Signed)
ED TO INPATIENT HANDOFF REPORT  Name/Age/Gender Blenda Mounts 72 y.o. male  Code Status Code Status History    Date Active Date Inactive Code Status Order ID Comments User Context   02/09/2016 0220 02/11/2016 1711 Full Code [id number]  Edwin Dada, MD Inpatient      Home/SNF/Other Home  Chief Complaint Abd/Leg Pain  Level of Care/Admitting Diagnosis ED Disposition    ED Disposition Condition [place]: [facility] [[id number]]  Level of Care: Telemetry [5]  Diagnosis: Acute systolic CHF (congestive heart failure) [facility]) [[id number]]  Admitting Physician: Rise Patience [phone]  Attending Physician: Rise Patience [phone]  Estimated length of stay: past midnight tomorrow  Certification:: I certify this patient will need inpatient services for at least 2 midnights  PT Class (Do Not Modify): Inpatient [101]  PT Acc Code (Do Not Modify): Private [1]       Medical History Past Medical History:  Diagnosis Date  . Atrial fibrillation ([place]) 2016  . CAD (coronary artery disease)   . Diabetes mellitus 2001  . Hypertension 2006  . Idiopathic cardiomyopathy ([place]) 11/01/2014   Followed by Dr. Acie Fredrickson, Cardiology  EF 25%  Adenosine Cardiolite did not support ischemic etiology  . OA (osteoarthritis) of knee 01/08/2017  . Pleural effusion     Allergies No Known Allergies  IV Location/Drains/Wounds Patient Lines/Drains/Airways Status   Active Line/Drains/Airways    Name:   Placement date:   Placement time:   Site:   Days:   Peripheral IV 06/18/18 Left Antecubital   06/18/18    2123    Antecubital   less than 1   Incision 01/16/12 Abdomen Other (Comment)   01/16/12    1136     2345   Incision - 3 Ports Abdomen 1: Umbilicus 2: Right;Lateral 3: Left;Lateral   01/16/12    -     2345          Labs/Imaging Results for orders placed or performed during the hospital encounter of 06/18/18 (from the past 48 hour(s))  Basic  metabolic panel     Status: Abnormal   Collection Time: 06/18/18  4:29 PM  Result Value Ref Range   Sodium 132 (L) 135 - 145 mmol/L   Potassium 3.9 3.5 - 5.1 mmol/L   Chloride 97 (L) 98 - 111 mmol/L   CO2 28 22 - 32 mmol/L   Glucose, Bld 169 (H) 70 - 99 mg/dL   BUN 29 (H) 8 - 23 mg/dL   Creatinine, Ser 1.45 (H) 0.61 - 1.24 mg/dL   Calcium 9.0 8.9 - 10.3 mg/dL   GFR calc non Af Amer 47 (L) >60 mL/min   GFR calc Af Amer 54 (L) >60 mL/min    Comment: (NOTE) The eGFR has been calculated using the CKD EPI equation. This calculation has not been validated in all clinical situations. eGFR's persistently <60 mL/min signify possible Chronic Kidney Disease.    Anion gap 7 5 - 15    Comment: Performed at [facility] [address], [place] [address]  CBC     Status: None   Collection Time: 06/18/18  4:29 PM  Result Value Ref Range   WBC 6.6 4.0 - 10.5 K/uL   RBC 4.29 4.22 - 5.81 MIL/uL   Hemoglobin 13.1 13.0 - 17.0 g/dL   HCT 40.4 39.0 - 52.0 %   MCV 94.2 80.0 - 100.0 fL   MCH 30.5 26.0 - 34.0  pg   MCHC 32.4 30.0 - 36.0 g/dL   RDW 14.6 11.5 - 15.5 %   Platelets 156 150 - 400 K/uL   nRBC 0.0 0.0 - 0.2 %    Comment: Performed at [facility], [place] [address], [place], [place] [address]  Brain natriuretic peptide     Status: Abnormal   Collection Time: 06/18/18  4:30 PM  Result Value Ref Range   B Natriuretic Peptide >4,500.0 (H) 0.0 - 100.0 pg/mL    Comment: Performed at [facility] [address] [place],  [address]  I-stat troponin, ED     Status: None   Collection Time: 06/18/18  4:34 PM  Result Value Ref Range   Troponin i, poc 0.03 0.00 - 0.08 ng/mL   Comment 3            Comment: Due to the release kinetics of cTnI, a negative result within the first hours of the onset of symptoms does not rule out myocardial infarction with certainty. If myocardial infarction is still suspected, repeat the test at appropriate intervals.    Dg Chest  2 View  Result Date: 06/18/2018 CLINICAL DATA:  Chest pain and history of CHF EXAM: CHEST - 2 VIEW COMPARISON:  02/08/2016 FINDINGS: Marked cardiomegaly, unchanged. There is pulmonary vascular congestion without overt edema. No pleural effusion or pneumothorax. No focal consolidation. IMPRESSION: Cardiomegaly without overt pulmonary edema. Electronically Signed   By: Ulyses Jarred M.D.   On: 06/18/2018 16:35   Dg Abdomen 1 View  Result Date: 06/18/2018 CLINICAL DATA:  Constipation. EXAM: ABDOMEN - 1 VIEW COMPARISON:  CT abdomen and pelvis 02/10/2016 FINDINGS: Gas is present in scattered loops of small and large bowel without significant bowel dilatation seen to indicate obstruction. A moderate amount of colonic stool is noted at the level of the splenic flexure. There is also a moderate amount of stool in the rectum. No gross intraperitoneal free air is evident on this supine study. Lumbar spondylosis is noted. IMPRESSION: Moderate amount of left-sided colonic and rectal stool without evidence of obstruction. Electronically Signed   By: Logan Bores M.D.   On: 06/18/2018 17:12    Pending Labs Unresulted Labs (From admission, onward)   None      Vitals/Pain Today's Vitals   06/18/18 2017 06/18/18 2045 06/18/18 2055 06/18/18 2130  BP: 117/87 118/77  117/74  Pulse: 87 78  62  Resp: (!) 25 (!) 25  (!) 23  Temp:      TempSrc:      SpO2: 97% 99%  99%  Weight:      Height:      PainSc:   0-No pain     Isolation Precautions No active isolations  Medications Medications  furosemide (LASIX) injection 80 mg (80 mg Intravenous Given 06/18/18 2123)    Mobility walks

## 2018-06-18 NOTE — ED Provider Notes (Signed)
[facility] EMERGENCY DEPARTMENT Provider Note   CSN: [id number] Arrival date & time: 06/18/18  1514     History   Chief Complaint Chief Complaint  Patient presents with  . Leg Pain  . Congestive Heart Failure    HPI Kyle Burke is a 72 y.o. male with history of atrial fibrillation, CAD, diabetes mellitus, hypertension, idiopathic cardiomyopathy (EF of 20 to 25% on echo performed on 02/11/2016) presents for evaluation of acute onset, worsening dyspnea on exertion for 3 days.  He states that he is unable to walk more than 4 5 steps without feeling very short of breath.  He also notes fatigue and generalized weakness.  He denies chest pain, abdominal pain, nausea, vomiting, fevers, or cough.  He states he has been compliant with his Lasix which he takes 80 mg twice daily.  Denies urinary symptoms. Currently sees Dr. Philbert Riser with cardiology at [facility] for his heart failure.  Patient is primarily Spanish-speaking, his son served as Optometrist throughout the encounter.  They adamantly refused the use of a formal translator.  The history is provided by the patient. The history is limited by a language barrier. Language interpreter used: Patient's son.    Past Medical History:  Diagnosis Date  . Atrial fibrillation ([place]) 2016  . CAD (coronary artery disease)   . Diabetes mellitus 2001  . Hypertension 2006  . Idiopathic cardiomyopathy ([place]) 11/01/2014   Followed by Dr. Acie Fredrickson, Cardiology  EF 25%  Adenosine Cardiolite did not support ischemic etiology  . OA (osteoarthritis) of knee 01/08/2017  . Pleural effusion     Patient Active Problem List   Diagnosis Date Noted  . Acute systolic CHF (congestive heart failure) ([place]) 06/18/2018  . Decreased pulses in feet 11/10/2017  . OA (osteoarthritis) of knee 01/08/2017  . Mild CAD 08/12/2016  . Benign neoplasm of descending colon   . Benign neoplasm of transverse colon   . Heme + stool   . Gastritis and  gastroduodenitis   . Essential hypertension 06/15/2015  . Normocytic anemia 04/07/2015  . Controlled type 2 diabetes mellitus with stage 3 chronic kidney disease, without long-term current use of insulin ([place])   . Dyslipidemia   . Microalbuminuria due to type 2 diabetes mellitus ([place])   . Chronic systolic CHF (congestive heart failure) ([place]) 12/15/2014  . Atrial fibrillation ([place]) 09/06/2014  . HTN (hypertension) 09/06/2014  . LBBB (left bundle branch block) 09/06/2014    Past Surgical History:  Procedure Laterality Date  . COLONOSCOPY WITH PROPOFOL N/A 05/14/2016   Procedure: COLONOSCOPY WITH PROPOFOL;  Surgeon: Jerene Bears, MD;  Location: [facility];  Service: Gastroenterology;  Laterality: N/A;  . ESOPHAGOGASTRODUODENOSCOPY (EGD) WITH PROPOFOL N/A 05/14/2016   Procedure: ESOPHAGOGASTRODUODENOSCOPY (EGD) WITH PROPOFOL;  Surgeon: Jerene Bears, MD;  Location: [facility];  Service: Gastroenterology;  Laterality: N/A;  . HERNIA REPAIR    . INCISE AND DRAIN ABCESS  1986   on leg  . INGUINAL HERNIA REPAIR  01/16/2012   Procedure: LAPAROSCOPIC INGUINAL HERNIA;  Surgeon: Gayland Curry, MD,FACS;  Location: [facility] ORS;  Service: General;  Laterality: Left;  Laparoscopic incarcerated Left inguinal hernia repair with mesh, umbilical hernia repair  . UMBILICAL HERNIA REPAIR  01/16/2012   Procedure: HERNIA REPAIR UMBILICAL ADULT;  Surgeon: Gayland Curry, MD,FACS;  Location: [facility];  Service: General;  Laterality: N/A;        Home Medications    Prior to Admission medications   Medication Sig Start Date  End Date Taking? Authorizing Provider  XARELTO 20 MG TABS tablet TAKE 1 TABLET BY MOUTH ONCE DAILY WITH SUPPER 10/28/17  Yes Mack Hook, MD  acetaminophen (TYLENOL) 650 MG CR tablet Take 650 mg by mouth every 8 (eight) hours as needed for pain. Reported on 02/14/2016    [provider]  amoxicillin-clavulanate (AUGMENTIN) 875-125 MG tablet 1 tab by mouth twice daily 10  days Patient not taking: Reported on 02/10/2018 08/05/17   Mack Hook, MD  atorvastatin (LIPITOR) 80 MG tablet Take 1 tablet (80 mg total) by mouth daily. 08/14/17   Mack Hook, MD  Blood Glucose Monitoring Suppl (AGAMATRIX PRESTO PRO METER) DEVI Twice daily blood glucose checks before meals 08/15/16   Mack Hook, MD  carvedilol (COREG) 3.125 MG tablet Take 1 tablet (3.125 mg total) by mouth 2 (two) times daily with a meal. 10/20/17   Mack Hook, MD  ferrous gluconate (FERGON) 324 MG tablet Take 1 tablet (324 mg total) by mouth daily with breakfast. 02/09/17   Mack Hook, MD  furosemide (LASIX) 40 MG tablet 2 tabs by mouth twice daily 09/16/17   Mack Hook, MD  glucose blood (AGAMATRIX PRESTO TEST) test strip Twice daily glucose checks before meals 08/15/16   Mack Hook, MD  Menthol-Camphor 3-3 % GEL Apply to knees as needed for pain 01/08/17   Mack Hook, MD  metFORMIN (GLUCOPHAGE) 500 MG tablet Take 1 tablet (500 mg total) by mouth 2 (two) times daily with a meal. 02/19/18   Mack Hook, MD  potassium chloride (K-DUR) 10 MEQ tablet Take 1 tablet (10 mEq total) by mouth daily. 12/24/16   Mack Hook, MD    Family History Family History  Problem Relation Age of Onset  . Stroke Mother   . Heart disease Mother   . Heart disease Father   . Diabetes Father   . Kidney disease Father        , history of stones as well  . Diabetes Brother   . Cancer Sister 75       unknown soft tissue cancer of leg    Social History Social History   Tobacco Use  . Smoking status: Never Smoker  . Smokeless tobacco: Never Used  Substance Use Topics  . Alcohol use: No    Alcohol/week: 0.0 standard drinks    Comment: rare occ., formerly more  . Drug use: No     Allergies   Patient has no known allergies.   Review of Systems Review of Systems  Constitutional: Positive for fatigue. Negative for chills and fever.   Respiratory: Positive for shortness of breath.   Cardiovascular: Negative for chest pain.  Gastrointestinal: Negative for abdominal pain, nausea and vomiting.  All other systems reviewed and are negative.    Physical Exam Updated Vital Signs BP 117/74   Pulse 62   Temp 98.8 F (37.1 C) (Oral)   Resp (!) 23   Ht 6\' 3"  (1.905 m)   Wt 103 kg   SpO2 99%   BMI 28.37 kg/m   Physical Exam  Constitutional: He appears well-developed and well-nourished. No distress.  HENT:  Head: Normocephalic and atraumatic.  Eyes: Conjunctivae are normal. Right eye exhibits no discharge. Left eye exhibits no discharge.  Neck: Normal range of motion. Neck supple. No JVD present. No tracheal deviation present.  Cardiovascular: Normal rate and intact distal pulses.  Murmur heard. 2+ pitting edema of the bilateral lower extremities.  2+ radial and DP/PT pulses bilaterally, Homans sign absent bilaterally, no  palpable cords.  Compartments are soft.  Pulmonary/Chest: Effort normal.  Globally diminished breath sounds.  No tenderness to palpation of the chest wall  Abdominal: Soft. Bowel sounds are normal. He exhibits no distension. There is no tenderness. There is no guarding.  Musculoskeletal: He exhibits no edema.  Neurological: He is alert.  Skin: Skin is warm and dry. No erythema.  Psychiatric: He has a normal mood and affect. His behavior is normal.  Nursing note and vitals reviewed.    ED Treatments / Results  Labs (all labs ordered are listed, but only abnormal results are displayed) Labs Reviewed  BASIC METABOLIC PANEL - Abnormal; Notable for the following components:      Result Value   Sodium 132 (*)    Chloride 97 (*)    Glucose, Bld 169 (*)    BUN 29 (*)    Creatinine, Ser 1.45 (*)    GFR calc non Af Amer 47 (*)    GFR calc Af Amer 54 (*)    All other components within normal limits  BRAIN NATRIURETIC PEPTIDE - Abnormal; Notable for the following components:   B Natriuretic  Peptide >4,500.0 (*)    All other components within normal limits  CBC  I-STAT TROPONIN, ED    EKG EKG Interpretation  Date/Time:  Friday June 18 2018 21:31:16 EDT Ventricular Rate:  68 PR Interval:    QRS Duration: 200 QT Interval:  501 QTC Calculation: 533 R Axis:   -163 Text Interpretation:  Atrial fibrillation Right bundle branch block Confirmed by Dene Gentry [phone]) on 06/18/2018 9:33:49 PM   Radiology Dg Chest 2 View  Result Date: 06/18/2018 CLINICAL DATA:  Chest pain and history of CHF EXAM: CHEST - 2 VIEW COMPARISON:  02/08/2016 FINDINGS: Marked cardiomegaly, unchanged. There is pulmonary vascular congestion without overt edema. No pleural effusion or pneumothorax. No focal consolidation. IMPRESSION: Cardiomegaly without overt pulmonary edema. Electronically Signed   By: Ulyses Jarred M.D.   On: 06/18/2018 16:35   Dg Abdomen 1 View  Result Date: 06/18/2018 CLINICAL DATA:  Constipation. EXAM: ABDOMEN - 1 VIEW COMPARISON:  CT abdomen and pelvis 02/10/2016 FINDINGS: Gas is present in scattered loops of small and large bowel without significant bowel dilatation seen to indicate obstruction. A moderate amount of colonic stool is noted at the level of the splenic flexure. There is also a moderate amount of stool in the rectum. No gross intraperitoneal free air is evident on this supine study. Lumbar spondylosis is noted. IMPRESSION: Moderate amount of left-sided colonic and rectal stool without evidence of obstruction. Electronically Signed   By: Logan Bores M.D.   On: 06/18/2018 17:12    Procedures Procedures (including critical care time)  Medications Ordered in ED Medications  furosemide (LASIX) injection 80 mg (80 mg Intravenous Given 06/18/18 2123)     Initial Impression / Assessment and Plan / ED Course  I have reviewed the triage vital signs and the nursing notes.  Pertinent labs & imaging results that were available during my care of the patient were reviewed  by me and considered in my medical decision making (see chart for details).     Presentation consistent with CHF exacerbation.  Patient is afebrile, tachypneic but otherwise vital signs are stable.  He is nontoxic in appearance.  EKG shows atrial fibrillation with right bundle branch block, troponin is negative, doubt ACS/MI at this time.  Imaging significant for market cardiomegaly and pulmonary vascular congestion no evidence of obstruction on abdominal radiograph.  Remainder  of lab work reviewed by me shows elevated creatinine of 1.45 and elevated BUN of 29, mild hypokalemia and hypochloremia.  He has an elevated BNP of greater than 4500.  Chart review using care everywhere shows his last BMP on October 22, 2016 was 735.  He was given 80 mg of IV Lasix in the ED.  No evidence of dissection, PE, cardiac tamponade, or esophageal rupture. Offered transfer to [facility] as his cardiologist is there but he prefers to stay here. Spoke with Dr. Hal Hope with [facility] hospitalist service who agrees to assume care of patient and bring him into the hospital for further evaluation and management.  Final Clinical Impressions(s) / ED Diagnoses   Final diagnoses:  Acute on chronic congestive heart failure, unspecified heart failure type [facility])    ED Discharge Orders    None       Renita Papa, PA-C 06/18/18 2210    Valarie Merino, MD 06/20/18 1552

## 2018-06-18 NOTE — ED Triage Notes (Signed)
Pt states that "I have fluid in my stomach and my legs." Pt has hx of CHF. Reports his legs hurts with ambulation. Swelling in the abdomen and feet.

## 2018-06-18 NOTE — H&P (Signed)
History and Physical    Kyle Burke [id number]:[id number] DOB: 02/17/1946 DOA: 06/18/2018  PCP: Mack Hook, MD  Patient coming from: Home.  Chief Complaint: Fatigue.   Spanish interpretation provided by family.  HPI: Kyle Burke is a 72 y.o. male with a of chronic nonischemic cardiomyopathy last EF measured was 20 to 25% in June 2017, atrial fibrillation, chronic LBBB, diabetes mellitus type 2, chronic kidney disease history of anemia on iron supplements presents to the ER because of increasing fatigue.  Patient states over the last 2 to 3 days patient has been feeling increasingly fatigued mostly on exertion denies any chest pain.  Short of breath on lying down and also has been having some abdominal bloating sensation over the last few days with some nausea but no vomiting or diarrhea.  Denies any fever chill has been having persistent cough for last 1 month with no wheezing.  Over the last 2 weeks patient has noticed increased weight gain of about 15 pounds despite taking his home dose of Lasix.  ED Course: The ER chest x-ray shows congestion.  BNP is more than 4500.  EKG shows A. fib rate controlled.  Patient has bilateral lower extremity edema and abdominal distention.  Abdomen appears nontender.  Patient was given Lasix 80 mg IV and admitted for acute CHF.  Review of Systems: As per HPI, rest all negative.   Past Medical History:  Diagnosis Date  . Atrial fibrillation ([place]) 2016  . CAD (coronary artery disease)   . Diabetes mellitus 2001  . Hypertension 2006  . Idiopathic cardiomyopathy ([place]) 11/01/2014   Followed by Dr. Acie Fredrickson, Cardiology  EF 25%  Adenosine Cardiolite did not support ischemic etiology  . OA (osteoarthritis) of knee 01/08/2017  . Pleural effusion     Past Surgical History:  Procedure Laterality Date  . COLONOSCOPY WITH PROPOFOL N/A 05/14/2016   Procedure: COLONOSCOPY WITH PROPOFOL;  Surgeon: Jerene Bears, MD;  Location: [facility];   Service: Gastroenterology;  Laterality: N/A;  . ESOPHAGOGASTRODUODENOSCOPY (EGD) WITH PROPOFOL N/A 05/14/2016   Procedure: ESOPHAGOGASTRODUODENOSCOPY (EGD) WITH PROPOFOL;  Surgeon: Jerene Bears, MD;  Location: [facility];  Service: Gastroenterology;  Laterality: N/A;  . HERNIA REPAIR    . INCISE AND DRAIN ABCESS  1986   on leg  . INGUINAL HERNIA REPAIR  01/16/2012   Procedure: LAPAROSCOPIC INGUINAL HERNIA;  Surgeon: Gayland Curry, MD,FACS;  Location: [facility];  Service: General;  Laterality: Left;  Laparoscopic incarcerated Left inguinal hernia repair with mesh, umbilical hernia repair  . UMBILICAL HERNIA REPAIR  01/16/2012   Procedure: HERNIA REPAIR UMBILICAL ADULT;  Surgeon: Gayland Curry, MD,FACS;  Location: [facility];  Service: General;  Laterality: N/A;     reports that he has never smoked. He has never used smokeless tobacco. He reports that he does not drink alcohol or use drugs.  No Known Allergies  Family History  Problem Relation Age of Onset  . Stroke Mother   . Heart disease Mother   . Heart disease Father   . Diabetes Father   . Kidney disease Father        , history of stones as well  . Diabetes Brother   . Cancer Sister 49       unknown soft tissue cancer of leg    Prior to Admission medications   Medication Sig Start Date End Date Taking? Authorizing Provider  XARELTO 20 MG TABS tablet TAKE 1 TABLET BY MOUTH ONCE DAILY WITH SUPPER 10/28/17  Yes Mack Hook, MD  acetaminophen (TYLENOL) 650 MG CR tablet Take 650 mg by mouth every 8 (eight) hours as needed for pain. Reported on 02/14/2016    [provider]  amoxicillin-clavulanate (AUGMENTIN) 875-125 MG tablet 1 tab by mouth twice daily 10 days Patient not taking: Reported on 02/10/2018 08/05/17   Mack Hook, MD  atorvastatin (LIPITOR) 80 MG tablet Take 1 tablet (80 mg total) by mouth daily. 08/14/17   Mack Hook, MD  Blood Glucose Monitoring Suppl (AGAMATRIX PRESTO PRO METER) DEVI Twice  daily blood glucose checks before meals 08/15/16   Mack Hook, MD  carvedilol (COREG) 3.125 MG tablet Take 1 tablet (3.125 mg total) by mouth 2 (two) times daily with a meal. 10/20/17   Mack Hook, MD  ferrous gluconate (FERGON) 324 MG tablet Take 1 tablet (324 mg total) by mouth daily with breakfast. 02/09/17   Mack Hook, MD  furosemide (LASIX) 40 MG tablet 2 tabs by mouth twice daily 09/16/17   Mack Hook, MD  glucose blood (AGAMATRIX PRESTO TEST) test strip Twice daily glucose checks before meals 08/15/16   Mack Hook, MD  Menthol-Camphor 3-3 % GEL Apply to knees as needed for pain 01/08/17   Mack Hook, MD  metFORMIN (GLUCOPHAGE) 500 MG tablet Take 1 tablet (500 mg total) by mouth 2 (two) times daily with a meal. 02/19/18   Mack Hook, MD  potassium chloride (K-DUR) 10 MEQ tablet Take 1 tablet (10 mEq total) by mouth daily. 12/24/16   Mack Hook, MD    Physical Exam: Vitals:   06/18/18 2017 06/18/18 2045 06/18/18 2130 06/18/18 2214  BP: 117/87 118/77 117/74 123/81  Pulse: 87 78 62 86  Resp: (!) 25 (!) 25 (!) 23 18  Temp:    98.7 F (37.1 C)  TempSrc:    Oral  SpO2: 97% 99% 99% 98%  Weight:    104.4 kg  Height:    6\' 3"  (1.905 m)      Constitutional: Moderately built and nourished. Vitals:   06/18/18 2017 06/18/18 2045 06/18/18 2130 06/18/18 2214  BP: 117/87 118/77 117/74 123/81  Pulse: 87 78 62 86  Resp: (!) 25 (!) 25 (!) 23 18  Temp:    98.7 F (37.1 C)  TempSrc:    Oral  SpO2: 97% 99% 99% 98%  Weight:    104.4 kg  Height:    6\' 3"  (1.905 m)   Eyes: Anicteric no pallor. ENMT: No discharge from the ears eyes nose or mouth. Neck: JVD elevated no mass felt. Respiratory: No rhonchi or crepitations. Cardiovascular: S1-S2 heard no murmurs appreciated. Abdomen: Mildly distended nontender bowel sounds present. Musculoskeletal: Bilateral lower extremity edema present. Skin: No rash.  Skin appears  warm. Neurologic: Alert awake oriented to time place and person.  Moves all extremities. Psychiatric: Appears normal per normal affect.   Labs on Admission: I have personally reviewed following labs and imaging studies  CBC: Recent Labs  Lab 06/18/18 1629  WBC 6.6  HGB 13.1  HCT 40.4  MCV 94.2  PLT 259   Basic Metabolic Panel: Recent Labs  Lab 06/18/18 1629  NA 132*  K 3.9  CL 97*  CO2 28  GLUCOSE 169*  BUN 29*  CREATININE 1.45*  CALCIUM 9.0   GFR: Estimated Creatinine Clearance: 61.1 mL/min (A) (by C-G formula based on SCr of 1.45 mg/dL (H)). Liver Function Tests: No results for input(s): AST, ALT, ALKPHOS, BILITOT, PROT, ALBUMIN in the last 168 hours. No results for input(s): LIPASE,  AMYLASE in the last 168 hours. No results for input(s): AMMONIA in the last 168 hours. Coagulation Profile: No results for input(s): INR, PROTIME in the last 168 hours. Cardiac Enzymes: No results for input(s): CKTOTAL, CKMB, CKMBINDEX, TROPONINI in the last 168 hours. BNP (last 3 results) No results for input(s): PROBNP in the last 8760 hours. HbA1C: No results for input(s): HGBA1C in the last 72 hours. CBG: No results for input(s): GLUCAP in the last 168 hours. Lipid Profile: No results for input(s): CHOL, HDL, LDLCALC, TRIG, CHOLHDL, LDLDIRECT in the last 72 hours. Thyroid Function Tests: No results for input(s): TSH, T4TOTAL, FREET4, T3FREE, THYROIDAB in the last 72 hours. Anemia Panel: No results for input(s): VITAMINB12, FOLATE, FERRITIN, TIBC, IRON, RETICCTPCT in the last 72 hours. Urine analysis: No results found for: COLORURINE, APPEARANCEUR, LABSPEC, PHURINE, GLUCOSEU, HGBUR, BILIRUBINUR, KETONESUR, PROTEINUR, UROBILINOGEN, NITRITE, LEUKOCYTESUR Sepsis Labs: @LABRCNTIP (procalcitonin:4,lacticidven:4) )No results found for this or any previous visit (from the past 240 hour(s)).   Radiological Exams on Admission: Dg Chest 2 View  Result Date: 06/18/2018 CLINICAL DATA:   Chest pain and history of CHF EXAM: CHEST - 2 VIEW COMPARISON:  02/08/2016 FINDINGS: Marked cardiomegaly, unchanged. There is pulmonary vascular congestion without overt edema. No pleural effusion or pneumothorax. No focal consolidation. IMPRESSION: Cardiomegaly without overt pulmonary edema. Electronically Signed   By: Ulyses Jarred M.D.   On: 06/18/2018 16:35   Dg Abdomen 1 View  Result Date: 06/18/2018 CLINICAL DATA:  Constipation. EXAM: ABDOMEN - 1 VIEW COMPARISON:  CT abdomen and pelvis 02/10/2016 FINDINGS: Gas is present in scattered loops of small and large bowel without significant bowel dilatation seen to indicate obstruction. A moderate amount of colonic stool is noted at the level of the splenic flexure. There is also a moderate amount of stool in the rectum. No gross intraperitoneal free air is evident on this supine study. Lumbar spondylosis is noted. IMPRESSION: Moderate amount of left-sided colonic and rectal stool without evidence of obstruction. Electronically Signed   By: Logan Bores M.D.   On: 06/18/2018 17:12    EKG: Independently reviewed.  A. fib rate controlled.  Assessment/Plan Principal Problem:   Acute systolic CHF (congestive heart failure) (HCC) Active Problems:   Atrial fibrillation (HCC)   HTN (hypertension)   LBBB (left bundle branch block)   Controlled type 2 diabetes mellitus with stage 3 chronic kidney disease, without long-term current use of insulin (HCC)   Acute CHF (congestive heart failure) ([place])    1. Acute on chronic systolic heart failure last EF measured in June 2017 was 20 to 25%.  Patient has been placed on Lasix.  Had received Lasix 80 mg IV in the ER and I have placed patient on Lasix 40 mg IV every 12.  Closely follow intake output metabolic panel and daily weights.  Patient is on Coreg.  But not on ARB or ACE inhibitor due to renal failure. 2. Fatigue likely from CHF.  Will check TSH and trend troponins. 3. Diabetes mellitus type 2 has been  placed on sliding scale coverage while inpatient.  Hold metformin. 4. Chronic atrial fibrillation -rate controlled presently on Xarelto and Coreg. 5. Chronic kidney disease stage III creatinine mildly elevated from previous.  Closely follow metabolic panel intake output. 6. Hypertension on Coreg and also on Lasix. 7. Hyperlipidemia on statins. 8. Nausea and abdominal discomfort -could be from ascites.  Abdomen is nontender.  Mildly distended.  Will check LFTs and sonogram of the abdomen.   DVT prophylaxis: Xarelto. Code  Status: Full code. Family Communication: Patient's son at the bedside. Disposition Plan: Home. Consults called: None. Admission status: Inpatient.   Rise Patience MD [facility] Pager [phone].  If 7PM-7AM, please contact night-coverage [email or web address] Password [email or web address]  06/18/2018, 11:07 PM

## 2018-06-19 ENCOUNTER — Inpatient Hospital Stay (HOSPITAL_COMMUNITY): Payer: Medicaid Other

## 2018-06-19 LAB — TROPONIN I
TROPONIN I: 0.04 ng/mL — AB (ref <0.03)
Troponin I: 0.04 ng/mL (ref <0.03)
Troponin I: 0.04 ng/mL (ref <0.03)

## 2018-06-19 LAB — CBC
HCT: 36.5 % — ABNORMAL LOW (ref 39.0–52.0)
Hemoglobin: 11.7 g/dL — ABNORMAL LOW (ref 13.0–17.0)
MCH: 29.4 pg (ref 26.0–34.0)
MCHC: 32.1 g/dL (ref 30.0–36.0)
MCV: 91.7 fL (ref 80.0–100.0)
PLATELETS: 145 10*3/uL — AB (ref 150–400)
RBC: 3.98 MIL/uL — ABNORMAL LOW (ref 4.22–5.81)
RDW: 14.5 % (ref 11.5–15.5)
WBC: 6.4 10*3/uL (ref 4.0–10.5)
nRBC: 0 % (ref 0.0–0.2)

## 2018-06-19 LAB — TSH: TSH: 2.355 u[IU]/mL (ref 0.350–4.500)

## 2018-06-19 LAB — BASIC METABOLIC PANEL
Anion gap: 11 (ref 5–15)
BUN: 25 mg/dL — AB (ref 8–23)
CALCIUM: 8.5 mg/dL — AB (ref 8.9–10.3)
CO2: 24 mmol/L (ref 22–32)
CREATININE: 1.2 mg/dL (ref 0.61–1.24)
Chloride: 100 mmol/L (ref 98–111)
GFR calc Af Amer: >60 mL/min (ref >60)
GFR, EST NON AFRICAN AMERICAN: 59 mL/min — AB (ref >60)
GLUCOSE: 92 mg/dL (ref 70–99)
Potassium: 3.2 mmol/L — ABNORMAL LOW (ref 3.5–5.1)
Sodium: 135 mmol/L (ref 135–145)

## 2018-06-19 LAB — HEPATIC FUNCTION PANEL
ALBUMIN: 3.5 g/dL (ref 3.5–5.0)
ALK PHOS: 146 U/L — AB (ref 38–126)
ALT: 16 U/L (ref 0–44)
AST: 27 U/L (ref 15–41)
BILIRUBIN INDIRECT: 1.6 mg/dL — AB (ref 0.3–0.9)
Bilirubin, Direct: 0.7 mg/dL — ABNORMAL HIGH (ref 0.0–0.2)
TOTAL PROTEIN: 7 g/dL (ref 6.5–8.1)
Total Bilirubin: 2.3 mg/dL — ABNORMAL HIGH (ref 0.3–1.2)

## 2018-06-19 LAB — MAGNESIUM: MAGNESIUM: 2 mg/dL (ref 1.7–2.4)

## 2018-06-19 LAB — GLUCOSE, CAPILLARY
GLUCOSE-CAPILLARY: 108 mg/dL — AB (ref 70–99)
Glucose-Capillary: 92 mg/dL (ref 70–99)
Glucose-Capillary: 104 mg/dL — ABNORMAL HIGH (ref 70–99)
Glucose-Capillary: 109 mg/dL — ABNORMAL HIGH (ref 70–99)

## 2018-06-19 NOTE — Progress Notes (Signed)
PROGRESS NOTE  Kyle Burke  XLK:[id number] DOB: 08-16-1946  DOA: 06/18/2018 PCP: Mack Hook, MD   Brief Narrative:  Kyle Burke is a 72 y.o. male with medical history significant for but not limited to chronic nonischemic cardiomyopathy( EF measured was 20 to 25% in June 2017), atrial fibrillation, chronic LBBB, diabetes mellitus type 2, chronic kidney disease history of anemia presenting with 2- 3 days history of easy fatigability, dyspnea on exertion and orthopnea,  lower extremity edema and weight gain despite compliance to diuretics.  ED evaluation notable for BNP of more 4500, edema on chest x-ray and 12 lead EKG negative for acute ST T wave changes.     Patient was given IV Lasix 80 mg in the ED, and admitted for acute CHF    Assessment & Plan:   Principal Problem:   Acute systolic CHF (congestive heart failure) ([place]) Active Problems:   Atrial fibrillation (HCC)   HTN (hypertension)   LBBB (left bundle branch block)   Controlled type 2 diabetes mellitus with stage 3 chronic kidney disease, without long-term current use of insulin (HCC)   Acute CHF (congestive heart failure) (HCC)  Acute on chronic systolic heart failure TSH 2.355 EF 20 to 25% 01/2016 - repeat Echo pending Continue beta-blocker-  No Arb or ACE inhibitor due to renal dysfunction I/O, Daily weights  Diuretics with monitoring of electrolytes and renal function  Chronic atrial fibrillation  rate control with B-Blocker cont Xarelto   Diabetes mellitus type 2 sliding scale coverage  Hold metformin  Chronic kidney disease stage III creatinine mildly elevated from baseline monitor electrolytes and renal function  Hyperbilirubinemia  No acute hepatobiliary abnormality ? Hepatic congestion from acute CHF  Outpatient follow up on discharge   DVT prophylaxis:  xarelto Code Status: full code Family Communication:  daughter at bedside Disposition Plan:  Anticipate discharge  tomorrow   Consultants:     Procedures:     Antimicrobials:      Subjective:  breathing better no chest pain, shortness of breath is a lot better, abdominal pain nausea resolved  Objective:  Vitals:   06/19/18 0512 06/19/18 0607 06/19/18 0846 06/19/18 1227  BP: (!) 94/54 (!) 87/58 108/67 107/80  Pulse: 67 75 90 84  Resp: 18  20 20   Temp: 100.1 F (37.8 C)  98.7 F (37.1 C) 98.7 F (37.1 C)  TempSrc: Oral  Oral Oral  SpO2:   98% 97%  Weight: 101.8 kg     Height:        Intake/Output Summary (Last 24 hours) at 06/19/2018 1910 Last data filed at 06/19/2018 1800 Gross per 24 hour  Intake 750 ml  Output 2200 ml  Net -1450 ml   Filed Weights   06/18/18 1611 06/18/18 2214 06/19/18 0512  Weight: 103 kg 104.4 kg 101.8 kg    Examination:  General exam:  Resting comfortably, of oxygen Respiratory system: Clear to auscultation. Respiratory effort normal. Cardiovascular system: S1 & S2 heard, RRR. No JVD, murmurs, rubs, gallops or clicks. No pedal edema. Gastrointestinal system: Abdomen is nondistended, soft and nontender. Normal bowel sounds heard. Central nervous system: Alert and oriented. No focal neurological deficits. Extremities:  Bipedal pitting edema(+) Skin: No rashes, lesions or ulcers Psychiatry: Judgement and insight appear normal. Mood & affect appropriate.     Data Reviewed: I have personally reviewed following labs and imaging studies  CBC: Recent Labs  Lab 06/18/18 1629 06/19/18 0614  WBC 6.6 6.4  HGB 13.1 11.7*  HCT 40.4  36.5*  MCV 94.2 91.7  PLT 156 517*   Basic Metabolic Panel: Recent Labs  Lab 06/18/18 1629 06/18/18 2326 06/19/18 0614  NA 132*  --  135  K 3.9  --  3.2*  CL 97*  --  100  CO2 28  --  24  GLUCOSE 169*  --  92  BUN 29*  --  25*  CREATININE 1.45*  --  1.20  CALCIUM 9.0  --  8.5*  MG  --  2.0  --    GFR: Estimated Creatinine Clearance: 73 mL/min (by C-G formula based on SCr of 1.2 mg/dL). Liver Function  Tests: Recent Labs  Lab 06/18/18 2326  AST 27  ALT 16  ALKPHOS 146*  BILITOT 2.3*  PROT 7.0  ALBUMIN 3.5   No results for input(s): LIPASE, AMYLASE in the last 168 hours. No results for input(s): AMMONIA in the last 168 hours. Coagulation Profile: No results for input(s): INR, PROTIME in the last 168 hours. Cardiac Enzymes: Recent Labs  Lab 06/18/18 2326 06/19/18 0614 06/19/18 1212  TROPONINI 0.04* 0.04* 0.04*   BNP (last 3 results) No results for input(s): PROBNP in the last 8760 hours. HbA1C: No results for input(s): HGBA1C in the last 72 hours. CBG: Recent Labs  Lab 06/19/18 0857 06/19/18 1206 06/19/18 1634  GLUCAP 92 104* 108*   Lipid Profile: No results for input(s): CHOL, HDL, LDLCALC, TRIG, CHOLHDL, LDLDIRECT in the last 72 hours. Thyroid Function Tests: Recent Labs    06/18/18 2326  TSH 2.355   Anemia Panel: No results for input(s): VITAMINB12, FOLATE, FERRITIN, TIBC, IRON, RETICCTPCT in the last 72 hours.  Sepsis Labs: Recent Labs  Lab 06/18/18 1629 06/19/18 0614  WBC 6.6 6.4    No results found for this or any previous visit (from the past 240 hour(s)).       Radiology Studies: Dg Chest 2 View  Result Date: 06/18/2018 CLINICAL DATA:  Chest pain and history of CHF EXAM: CHEST - 2 VIEW COMPARISON:  02/08/2016 FINDINGS: Marked cardiomegaly, unchanged. There is pulmonary vascular congestion without overt edema. No pleural effusion or pneumothorax. No focal consolidation. IMPRESSION: Cardiomegaly without overt pulmonary edema. Electronically Signed   By: Ulyses Jarred M.D.   On: 06/18/2018 16:35   Dg Abdomen 1 View  Result Date: 06/18/2018 CLINICAL DATA:  Constipation. EXAM: ABDOMEN - 1 VIEW COMPARISON:  CT abdomen and pelvis 02/10/2016 FINDINGS: Gas is present in scattered loops of small and large bowel without significant bowel dilatation seen to indicate obstruction. A moderate amount of colonic stool is noted at the level of the splenic  flexure. There is also a moderate amount of stool in the rectum. No gross intraperitoneal free air is evident on this supine study. Lumbar spondylosis is noted. IMPRESSION: Moderate amount of left-sided colonic and rectal stool without evidence of obstruction. Electronically Signed   By: Logan Bores M.D.   On: 06/18/2018 17:12   US Abdomen Complete  Result Date: 06/19/2018 CLINICAL DATA:  Abdominal pain for 6 months. EXAM: ABDOMEN ULTRASOUND COMPLETE COMPARISON:  MRI of the abdomen 06/16/2017 FINDINGS: Gallbladder: The gallbladder is mildly contracted. Wall thickness is upper limits of normal to slightly thickened at 3.5 mm. No stones or polyps are present. Common bile duct: Diameter: 4.5 mm, within normal limits. There is a mildly nodular appearance of the liver. No discrete lesions are present. Parenchyma is of normal echotexture. Liver: No focal lesion identified. Within normal limits in parenchymal echogenicity. Portal vein is patent on color  Doppler imaging with normal direction of blood flow towards the liver. IVC: No abnormality visualized. Pancreas: The pancreas is not visualized due to overlying bowel gas. Spleen: Size and appearance within normal limits. Right Kidney: Length: 11.2 cm, within normal limits. Echogenicity within normal limits. No mass or hydronephrosis visualized. Left Kidney: Length: 12.4 cm, within normal limits. Echogenicity within normal limits. No mass or hydronephrosis visualized. Abdominal aorta: No aneurysm visualized. The midportion of the aorta is poorly visualized due to overlying bowel gas. Other findings: Moderate abdominal ascites are again noted. IMPRESSION: 1. Moderate abdominal ascites. 2. Somewhat nodular appearance of the liver.  Question cirrhosis. 3. Gallbladder wall thickening is upper limits of normal. This is likely exaggerated due to contracted state of the gallbladder. Electronically Signed   By: San Morelle M.D.   On: 06/19/2018 09:40         Scheduled Meds: . atorvastatin  80 mg Oral q1800  . carvedilol  3.125 mg Oral BID WC  . ferrous gluconate  324 mg Oral Q breakfast  . furosemide  40 mg Intravenous Q12H  . insulin aspart  0-9 Units Subcutaneous TID WC  . potassium chloride  10 mEq Oral Daily  . rivaroxaban  20 mg Oral Q supper   Continuous Infusions:   LOS: 1 day    Time spent: 35 mins    Benito Mccreedy, MD [facility] Pager [phone]  If 7PM-7AM, please contact night-coverage [email or web address] Password [email or web address] 06/19/2018, 7:10 PM

## 2018-06-19 NOTE — Progress Notes (Signed)
Pt is stable during AM shift, ambulated in a hallway, family member present at bed side and is updating, interpretor used this am to assess the patient and understanding his concerns, denies CP and SOB, will continue to monitor the patient  Palma Holter, Therapist, sports

## 2018-06-19 NOTE — Progress Notes (Signed)
Messaged Dr. Hal Hope per low BP 87/58 HR 75.  Ordered to Hold 0600/0700am LASIX and COREG, take BP in 2 hours and reassess if can administer these meds.

## 2018-06-19 NOTE — Progress Notes (Addendum)
Messaged Troponin 0.04 to Dr. Hal Hope. Dx CHF  MD returned message with no new orders.

## 2018-06-19 NOTE — Progress Notes (Addendum)
Pt and Family member educated regarding CHF, diet and medicines and addressed their concerns. Palma Holter, RN

## 2018-06-20 ENCOUNTER — Other Ambulatory Visit: Payer: Self-pay

## 2018-06-20 ENCOUNTER — Inpatient Hospital Stay: Payer: Self-pay

## 2018-06-20 ENCOUNTER — Inpatient Hospital Stay (HOSPITAL_COMMUNITY): Payer: Medicaid Other

## 2018-06-20 DIAGNOSIS — I5030 Unspecified diastolic (congestive) heart failure: Secondary | ICD-10-CM

## 2018-06-20 LAB — GLUCOSE, CAPILLARY
GLUCOSE-CAPILLARY: 150 mg/dL — AB (ref 70–99)
Glucose-Capillary: 111 mg/dL — ABNORMAL HIGH (ref 70–99)
Glucose-Capillary: 129 mg/dL — ABNORMAL HIGH (ref 70–99)
Glucose-Capillary: 108 mg/dL — ABNORMAL HIGH (ref 70–99)

## 2018-06-20 LAB — ECHOCARDIOGRAM COMPLETE
Height: 75 in
Weight: 3585.6 oz

## 2018-06-20 LAB — COOXEMETRY PANEL
Carboxyhemoglobin: 1.2 % (ref 0.5–1.5)
METHEMOGLOBIN: 1.6 % — AB (ref 0.0–1.5)
O2 Saturation: 71.1 %
Total hemoglobin: 13 g/dL (ref 12.0–16.0)

## 2018-06-20 MED ORDER — SODIUM CHLORIDE 0.9% FLUSH: 10.0000 mL | Freq: Two times a day (BID) | INTRAVENOUS | Status: DC

## 2018-06-20 MED ORDER — SODIUM CHLORIDE 0.9% FLUSH
10.0000 mL | INTRAVENOUS | Status: DC | PRN
  Administered 2018-06-22: 30 mL
  Filled 2018-06-20: qty 40

## 2018-06-20 NOTE — Consult Note (Signed)
CONSULTATION NOTE   Patient Name: Kyle Burke Date of Encounter: 06/20/2018 Cardiologist: Brandon Melnick, MD - [facility]  Chief Complaint   Hypotension, heart failure  Patient Profile   72 yo male patient of Dr. Acie Fredrickson with history of CHF, CAD, DM2, HTN and Afib, presented with fatigue and DOE with weight gain, suggestive of acute on chronic systolic CHF. Cardiology is asked to see to assist in management of hypotension.  HPI   Kyle Burke is a 72 y.o. Spanish-speaking male who is being seen today for the evaluation of CHF and hypotension at the request of Dr. Vista Lawman.  His son, who was present during my examination provided translation. This is a 72 year old male followed by Brandon Melnick, MD at [facility] cardiology (was seen by Dr. Acie Fredrickson, however transferred his care as his PCP is in the [facility] and he wanted a Spanish-speaking cardiologist).  He has a history of nonischemic cardiomyopathy, persistent atrial fibrillation and was being evaluated for CRT-D therapy as of his last office visit in April 2019.  He now presents with fatigue and dyspnea on exertion as well as 15 pound weight gain over several weeks concerning for acute on chronic systolic congestive heart failure.  LVEF most recently was 20%.  He was being considered for CRT-D therapy.  He has been diuresed on IV Lasix overnight and is more than 2 L net negative.  He reports improvement in his shortness of breath but is been noted to be hypotensive with blood pressures in the 80s overnight.  T-max is 100.8 however no obvious infection is suspected.  A repeat echo was performed today which I personally reviewed and demonstrated a severely decreased LVEF to 15% with global hypokinesis, inferior akinesis and septal dyskinesis.  PMHx   Past Medical History:  Diagnosis Date  . Atrial fibrillation ([place]) 2016  . CAD (coronary artery disease)   . Diabetes mellitus 2001  . Hypertension 2006    . Idiopathic cardiomyopathy ([place]) 11/01/2014   Followed by Dr. Acie Fredrickson, Cardiology  EF 25%  Adenosine Cardiolite did not support ischemic etiology  . OA (osteoarthritis) of knee 01/08/2017  . Pleural effusion     Past Surgical History:  Procedure Laterality Date  . COLONOSCOPY WITH PROPOFOL N/A 05/14/2016   Procedure: COLONOSCOPY WITH PROPOFOL;  Surgeon: Jerene Bears, MD;  Location: [facility];  Service: Gastroenterology;  Laterality: N/A;  . ESOPHAGOGASTRODUODENOSCOPY (EGD) WITH PROPOFOL N/A 05/14/2016   Procedure: ESOPHAGOGASTRODUODENOSCOPY (EGD) WITH PROPOFOL;  Surgeon: Jerene Bears, MD;  Location: [facility];  Service: Gastroenterology;  Laterality: N/A;  . HERNIA REPAIR    . INCISE AND DRAIN ABCESS  1986   on leg  . INGUINAL HERNIA REPAIR  01/16/2012   Procedure: LAPAROSCOPIC INGUINAL HERNIA;  Surgeon: Gayland Curry, MD,FACS;  Location: [facility];  Service: General;  Laterality: Left;  Laparoscopic incarcerated Left inguinal hernia repair with mesh, umbilical hernia repair  . UMBILICAL HERNIA REPAIR  01/16/2012   Procedure: HERNIA REPAIR UMBILICAL ADULT;  Surgeon: Gayland Curry, MD,FACS;  Location: [facility];  Service: General;  Laterality: N/A;    FAMHx   Family History  Problem Relation Age of Onset  . Stroke Mother   . Heart disease Mother   . Heart disease Father   . Diabetes Father   . Kidney disease Father        , history of stones as well  . Diabetes Brother   . Cancer Sister 64  unknown soft tissue cancer of leg    SOCHx    reports that he has never smoked. He has never used smokeless tobacco. He reports that he does not drink alcohol or use drugs.  Outpatient Medications   No current facility-administered medications on file prior to encounter.    Current Outpatient Medications on File Prior to Encounter  Medication Sig Dispense Refill  . acetaminophen (TYLENOL) 650 MG CR tablet Take 650 mg by mouth every 8 (eight) hours as needed for pain. Reported on  02/14/2016    . atorvastatin (LIPITOR) 80 MG tablet Take 1 tablet (80 mg total) by mouth daily. (Patient taking differently: Take 80 mg by mouth every evening. ) 30 tablet 11  . carvedilol (COREG) 3.125 MG tablet Take 1 tablet (3.125 mg total) by mouth 2 (two) times daily with a meal. 60 tablet 11  . ferrous gluconate (FERGON) 324 MG tablet Take 1 tablet (324 mg total) by mouth daily with breakfast.  3  . furosemide (LASIX) 40 MG tablet 2 tabs by mouth twice daily (Patient taking differently: Take 80 mg by mouth 2 (two) times daily. ) 120 tablet 11  . Menthol-Camphor 3-3 % GEL Apply to knees as needed for pain (Patient taking differently: Apply 1 application topically daily as needed (knee pain). )  0  . metFORMIN (GLUCOPHAGE) 500 MG tablet Take 1 tablet (500 mg total) by mouth 2 (two) times daily with a meal. 60 tablet 6  . potassium chloride (K-DUR) 10 MEQ tablet Take 1 tablet (10 mEq total) by mouth daily. 30 tablet 11  . vitamin C (ASCORBIC ACID) 500 MG tablet Take 500 mg by mouth daily.    Alveda Reasons 20 MG TABS tablet TAKE 1 TABLET BY MOUTH ONCE DAILY WITH SUPPER (Patient taking differently: Take 20 mg by mouth daily with supper. ) 30 tablet 11  . Blood Glucose Monitoring Suppl (AGAMATRIX PRESTO PRO METER) DEVI Twice daily blood glucose checks before meals 1 Device 0  . glucose blood (AGAMATRIX PRESTO TEST) test strip Twice daily glucose checks before meals 100 each 12    Inpatient Medications    Scheduled Meds: . atorvastatin  80 mg Oral q1800  . carvedilol  3.125 mg Oral BID WC  . ferrous gluconate  324 mg Oral Q breakfast  . furosemide  40 mg Intravenous Q12H  . insulin aspart  0-9 Units Subcutaneous TID WC  . potassium chloride  10 mEq Oral Daily  . rivaroxaban  20 mg Oral Q supper    Continuous Infusions:   PRN Meds: acetaminophen **OR** acetaminophen   ALLERGIES   No Known Allergies  ROS   Pertinent items noted in HPI and remainder of comprehensive ROS otherwise  negative.  Vitals   Vitals:   06/20/18 0105 06/20/18 0107 06/20/18 0613 06/20/18 0808  BP: 91/75 102/80 110/71 100/60  Pulse: 87 94 (!) 123   Resp:   18   Temp:   98.8 F (37.1 C)   TempSrc:   Oral   SpO2:   95%   Weight:   101.7 kg   Height:        Intake/Output Summary (Last 24 hours) at 06/20/2018 1056 Last data filed at 06/20/2018 0808 Gross per 24 hour  Intake 820 ml  Output 1750 ml  Net -930 ml   Filed Weights   06/18/18 2214 06/19/18 0512 06/20/18 0613  Weight: 104.4 kg 101.8 kg 101.7 kg    Physical Exam   General appearance: alert and no  distress Neck: JVD - a few cm above sternal notch, no carotid bruit, no JVD and thyroid not enlarged, symmetric, no tenderness/mass/nodules Lungs: diminished breath sounds bibasilar Heart: irregularly irregular rhythm Abdomen: soft, non-tender; bowel sounds normal; no masses,  no organomegaly Extremities: edema Trace pitting bilateral Pulses: 2+ and symmetric Skin: Skin color, texture, turgor normal. No rashes or lesions Neurologic: Grossly normal Psych: Pleasant  Labs   Results for orders placed or performed during the hospital encounter of 06/18/18 (from the past 48 hour(s))  Basic metabolic panel     Status: Abnormal   Collection Time: 06/18/18  4:29 PM  Result Value Ref Range   Sodium 132 (L) 135 - 145 mmol/L   Potassium 3.9 3.5 - 5.1 mmol/L   Chloride 97 (L) 98 - 111 mmol/L   CO2 28 22 - 32 mmol/L   Glucose, Bld 169 (H) 70 - 99 mg/dL   BUN 29 (H) 8 - 23 mg/dL   Creatinine, Ser 1.45 (H) 0.61 - 1.24 mg/dL   Calcium 9.0 8.9 - 10.3 mg/dL   GFR calc non Af Amer 47 (L) >60 mL/min   GFR calc Af Amer 54 (L) >60 mL/min    Comment: (NOTE) The eGFR has been calculated using the CKD EPI equation. This calculation has not been validated in all clinical situations. eGFR's persistently <60 mL/min signify possible Chronic Kidney Disease.    Anion gap 7 5 - 15    Comment: Performed at [facility] [address] [place] [address]  CBC     Status: None   Collection Time: 06/18/18  4:29 PM  Result Value Ref Range   WBC 6.6 4.0 - 10.5 K/uL   RBC 4.29 4.22 - 5.81 MIL/uL   Hemoglobin 13.1 13.0 - 17.0 g/dL   HCT 40.4 39.0 - 52.0 %   MCV 94.2 80.0 - 100.0 fL   MCH 30.5 26.0 - 34.0 pg   MCHC 32.4 30.0 - 36.0 g/dL   RDW 14.6 11.5 - 15.5 %   Platelets 156 150 - 400 K/uL   nRBC 0.0 0.0 - 0.2 %    Comment: Performed at [facility], [place] [address] [place], [place] [address]  Brain natriuretic peptide     Status: Abnormal   Collection Time: 06/18/18  4:30 PM  Result Value Ref Range   B Natriuretic Peptide >4,500.0 (H) 0.0 - 100.0 pg/mL    Comment: Performed at [facility] [address], [place],  [place] [address]  I-stat troponin, ED     Status: None   Collection Time: 06/18/18  4:34 PM  Result Value Ref Range   Troponin i, poc 0.03 0.00 - 0.08 ng/mL   Comment 3            Comment: Due to the release kinetics of cTnI, a negative result within the first hours of the onset of symptoms does not rule out myocardial infarction with certainty. If myocardial infarction is still suspected, repeat the test at appropriate intervals.   Hepatic function panel     Status: Abnormal   Collection Time: 06/18/18 11:26 PM  Result Value Ref Range   Total Protein 7.0 6.5 - 8.1 g/dL   Albumin 3.5 3.5 - 5.0 g/dL   AST 27 15 - 41 U/L   ALT 16 0 - 44 U/L   Alkaline Phosphatase 146 (H) 38 - 126 U/L   Total Bilirubin 2.3 (H) 0.3 - 1.2 mg/dL   Bilirubin, Direct  0.7 (H) 0.0 - 0.2 mg/dL   Indirect Bilirubin 1.6 (H) 0.3 - 0.9 mg/dL    Comment: Performed at [facility], [place] [address] [place], [place] [address]  Magnesium     Status: None   Collection Time: 06/18/18 11:26 PM  Result Value Ref Range   Magnesium 2.0 1.7 - 2.4 mg/dL    Comment: Performed at [facility], [place] [address], [place], [place] [address]  TSH     Status: None   Collection Time: 06/18/18 11:26 PM  Result  Value Ref Range   TSH 2.355 0.350 - 4.500 uIU/mL    Comment: Performed by a 3rd Generation assay with a functional sensitivity of <=0.01 uIU/mL. Performed at [facility], [place] [address], [place], [place] [address]   Troponin I     Status: Abnormal   Collection Time: 06/18/18 11:26 PM  Result Value Ref Range   Troponin I 0.04 (HH) <0.03 ng/mL    Comment: CRITICAL RESULT CALLED TO, READ BACK BY AND VERIFIED WITH: [facility] RN 06/19/2018 0143 JORDANS Performed at [facility], [place] [address], [place], [place] [address]   Basic metabolic panel     Status: Abnormal   Collection Time: 06/19/18  6:14 AM  Result Value Ref Range   Sodium 135 135 - 145 mmol/L   Potassium 3.2 (L) 3.5 - 5.1 mmol/L   Chloride 100 98 - 111 mmol/L   CO2 24 22 - 32 mmol/L   Glucose, Bld 92 70 - 99 mg/dL   BUN 25 (H) 8 - 23 mg/dL   Creatinine, Ser 1.20 0.61 - 1.24 mg/dL   Calcium 8.5 (L) 8.9 - 10.3 mg/dL   GFR calc non Af Amer 59 (L) >60 mL/min   GFR calc Af Amer >60 >60 mL/min    Comment: (NOTE) The eGFR has been calculated using the CKD EPI equation. This calculation has not been validated in all clinical situations. eGFR's persistently <60 mL/min signify possible Chronic Kidney Disease.    Anion gap 11 5 - 15    Comment: Performed at [facility] [address] [place], [place] [address]  CBC     Status: Abnormal   Collection Time: 06/19/18  6:14 AM  Result Value Ref Range   WBC 6.4 4.0 - 10.5 K/uL   RBC 3.98 (L) 4.22 - 5.81 MIL/uL   Hemoglobin 11.7 (L) 13.0 - 17.0 g/dL   HCT 36.5 (L) 39.0 - 52.0 %   MCV 91.7 80.0 - 100.0 fL   MCH 29.4 26.0 - 34.0 pg   MCHC 32.1 30.0 - 36.0 g/dL   RDW 14.5 11.5 - 15.5 %   Platelets 145 (L) 150 - 400 K/uL   nRBC 0.0 0.0 - 0.2 %    Comment: Performed at [facility], [place] [address], [place], [place] [address]  Troponin I     Status: Abnormal   Collection Time: 06/19/18  6:14 AM  Result Value Ref Range   Troponin I 0.04 (HH) <0.03 ng/mL      Comment: CRITICAL VALUE NOTED.  VALUE IS CONSISTENT WITH PREVIOUSLY REPORTED AND CALLED VALUE. Performed at [facility], [place] [address], [place], [place] [address]   Glucose, capillary     Status: None   Collection Time: 06/19/18  8:57 AM  Result Value Ref Range   Glucose-Capillary 92 70 - 99 mg/dL  Glucose, capillary     Status: Abnormal   Collection Time:  06/19/18 12:06 PM  Result Value Ref Range   Glucose-Capillary 104 (H) 70 - 99 mg/dL  Troponin I     Status: Abnormal   Collection Time: 06/19/18 12:12 PM  Result Value Ref Range   Troponin I 0.04 (HH) <0.03 ng/mL    Comment: CRITICAL VALUE NOTED.  VALUE IS CONSISTENT WITH PREVIOUSLY REPORTED AND CALLED VALUE. Performed at [facility], [place] [address] [place], [place] [address]   Glucose, capillary     Status: Abnormal   Collection Time: 06/19/18  4:34 PM  Result Value Ref Range   Glucose-Capillary 108 (H) 70 - 99 mg/dL  Glucose, capillary     Status: Abnormal   Collection Time: 06/19/18  9:18 PM  Result Value Ref Range   Glucose-Capillary 109 (H) 70 - 99 mg/dL  Glucose, capillary     Status: Abnormal   Collection Time: 06/20/18  7:32 AM  Result Value Ref Range   Glucose-Capillary 111 (H) 70 - 99 mg/dL   Comment 1 Notify RN    Comment 2 Document in Chart     ECG   A. fib with left bundle branch block at 72- Personally Reviewed  Telemetry   A. fib, rate controlled- Personally Reviewed  Radiology   Dg Chest 2 View  Result Date: 06/18/2018 CLINICAL DATA:  Chest pain and history of CHF EXAM: CHEST - 2 VIEW COMPARISON:  02/08/2016 FINDINGS: Marked cardiomegaly, unchanged. There is pulmonary vascular congestion without overt edema. No pleural effusion or pneumothorax. No focal consolidation. IMPRESSION: Cardiomegaly without overt pulmonary edema. Electronically Signed   By: Ulyses Jarred M.D.   On: 06/18/2018 16:35   Dg Abdomen 1 View  Result Date: 06/18/2018 CLINICAL DATA:  Constipation. EXAM: ABDOMEN  - 1 VIEW COMPARISON:  CT abdomen and pelvis 02/10/2016 FINDINGS: Gas is present in scattered loops of small and large bowel without significant bowel dilatation seen to indicate obstruction. A moderate amount of colonic stool is noted at the level of the splenic flexure. There is also a moderate amount of stool in the rectum. No gross intraperitoneal free air is evident on this supine study. Lumbar spondylosis is noted. IMPRESSION: Moderate amount of left-sided colonic and rectal stool without evidence of obstruction. Electronically Signed   By: Logan Bores M.D.   On: 06/18/2018 17:12   US Abdomen Complete  Result Date: 06/19/2018 CLINICAL DATA:  Abdominal pain for 6 months. EXAM: ABDOMEN ULTRASOUND COMPLETE COMPARISON:  MRI of the abdomen 06/16/2017 FINDINGS: Gallbladder: The gallbladder is mildly contracted. Wall thickness is upper limits of normal to slightly thickened at 3.5 mm. No stones or polyps are present. Common bile duct: Diameter: 4.5 mm, within normal limits. There is a mildly nodular appearance of the liver. No discrete lesions are present. Parenchyma is of normal echotexture. Liver: No focal lesion identified. Within normal limits in parenchymal echogenicity. Portal vein is patent on color Doppler imaging with normal direction of blood flow towards the liver. IVC: No abnormality visualized. Pancreas: The pancreas is not visualized due to overlying bowel gas. Spleen: Size and appearance within normal limits. Right Kidney: Length: 11.2 cm, within normal limits. Echogenicity within normal limits. No mass or hydronephrosis visualized. Left Kidney: Length: 12.4 cm, within normal limits. Echogenicity within normal limits. No mass or hydronephrosis visualized. Abdominal aorta: No aneurysm visualized. The midportion of the aorta is poorly visualized due to overlying bowel gas. Other findings: Moderate abdominal ascites are again noted. IMPRESSION: 1. Moderate abdominal ascites. 2. Somewhat nodular  appearance of the  liver.  Question cirrhosis. 3. Gallbladder wall thickening is upper limits of normal. This is likely exaggerated due to contracted state of the gallbladder. Electronically Signed   By: San Morelle M.D.   On: 06/19/2018 09:40    Cardiac Studies   LV EF: 15%  ------------------------------------------------------------------- Indications:      CHF - 428.0.  ------------------------------------------------------------------- History:   PMH:   Atrial fibrillation.  Cardiomyopathy.  Risk factors:  Hypertension. Diabetes mellitus.  ------------------------------------------------------------------- Study Conclusions  - Left ventricle: The cavity size was moderately dilated. Wall   thickness was normal. The estimated ejection fraction was 15%.   Severe global hypokinesis with inferior akinesis. Septal   dyskinesis. The study is not technically sufficient to allow   evaluation of LV diastolic function. - Aortic valve: Trileaflet; mildly thickened leaflets. There was   trivial regurgitation. - Mitral valve: Mildly thickened leaflets . There was moderate   regurgitation. - Left atrium: Severely dilated. - Right ventricle: The cavity size was mildly dilated. Mildly   reduced systolic function. - Atrial septum: A patent foramen ovale cannot be excluded. - Tricuspid valve: There was moderate regurgitation. - Pulmonary arteries: PA peak pressure: 65 mm Hg (S). - Inferior vena cava: The vessel was dilated. The respirophasic   diameter changes were blunted (< 50%), consistent with elevated   central venous pressure.  Impressions:  - Compared to a prior echo in 2017, the LVEF is further decreased   to 15% with severe global hypokinesis, inferior akinesis and   septal dyskinesis.  Impression   Principal Problem:   Acute systolic CHF (congestive heart failure) (HCC) Active Problems:   Atrial fibrillation (HCC)   HTN (hypertension)   LBBB (left bundle  branch block)   Controlled type 2 diabetes mellitus with stage 3 chronic kidney disease, without long-term current use of insulin (HCC)   Acute CHF (congestive heart failure) ([place])   Recommendation   1. Mr. Romeo Burke presented with acute on chronic systolic congestive heart failure with low cardiac output and is hypotensive.  His LVEF is lower at 15% with biventricular failure.  He has a wide QRS rhythm (LBBB) in atrial fibrillation and had been considered for CRT-D therapy by his cardiologist at [facility].  Although he is diuresed and is improving, he is in need of advanced heart failure therapy and has not been seen in follow-up in the past 8 months.  His creatinine has improved some with diuresis and troponin is flat borderline elevated.  He will likely need right heart catheterization and advanced heart failure therapy with possible inotropes.  After expressing this to him he indicated that he would be interested in receiving that care at [facility].  I discussed this case with Dr. Vista Lawman and he will speak with [facility] to possible facilitate transfer to Cardiology there.   Time Spent Directly with Patient:  I have spent a total of 65 minutes with the patient reviewing hospital notes, telemetry, EKGs, labs and examining the patient as well as establishing an assessment and plan that was discussed personally with the patient.  > 50% of time was spent in direct patient care.  Length of Stay:  LOS: 2 days   Pixie Casino, MD, [facility], [place] Director of the Advanced Lipid Disorders &  Cardiovascular Risk Reduction Clinic Diplomate of the American Board of Clinical Lipidology Attending Cardiologist  Direct Dial: [phone]  Fax: [phone]  Website:  [email or web address]   Pixie Casino 06/20/2018,  10:56 AM

## 2018-06-20 NOTE — Progress Notes (Signed)
PROGRESS NOTE  Kyle Burke  GEX:[id number] DOB: 1946/08/12  DOA: 06/18/2018 PCP: Mack Hook, MD   Brief Narrative:  Kyle Burke is a 72 y.o. male with medical history significant for but not limited to chronic nonischemic cardiomyopathy( EF measured was 20 to 25% in June 2017), atrial fibrillation, chronic LBBB, diabetes mellitus type 2, chronic kidney disease history of anemia presenting with 2- 3 days history of easy fatigability, dyspnea on exertion and orthopnea,  lower extremity edema and weight gain despite compliance to diuretics.  ED evaluation notable for BNP of more 4500, edema on chest x-ray and 12 lead EKG negative for acute ST T wave changes.     Patient was given IV Lasix 80 mg in the ED, and admitted for acute CHF    Assessment & Plan:   Principal Problem:   Acute systolic CHF (congestive heart failure) (HCC) Active Problems:   Atrial fibrillation (HCC)   HTN (hypertension)   LBBB (left bundle branch block)   Controlled type 2 diabetes mellitus with stage 3 chronic kidney disease, without long-term current use of insulin (HCC)   Acute CHF (congestive heart failure) (HCC)  Acute on chronic systolic heart failure TSH 2.355 EF 20 to 25% 01/2016 - Echo -06/20/2018 - EF 15%, Severe global hypokinesis, inferior akinesis, septal dyskinesis Continue beta-blocker-  No Arb or ACE inhibitor due to renal dysfunction I/O, Daily weights  Diuretics with monitoring of electrolytes and renal function Cardiology, Dr Debara Pickett consulted   Chronic atrial fibrillation  rate control with B-Blocker cont Xarelto   Diabetes mellitus type 2 sliding scale coverage  Hold metformin  Chronic kidney disease stage III creatinine mildly elevated from baseline monitor electrolytes and renal function  Hyperbilirubinemia  No acute hepatobiliary abnormality ? Hepatic congestion from acute CHF  Outpatient follow up on discharge   DVT prophylaxis:  xarelto Code  Status: full code Family Communication:  daughter at bedside Disposition Plan:  Anticipate discharge tomorrow   Consultants:   Cardiology  Procedures:     Antimicrobials:      Subjective: No acute events noted overnight, no chest pain, shortness of breath improved  Objective:  Vitals:   06/20/18 0107 06/20/18 0613 06/20/18 0808 06/20/18 1229  BP: 102/80 110/71 100/60 95/83  Pulse: 94 (!) 123  98  Resp:  18  20  Temp:  98.8 F (37.1 C)  98.2 F (36.8 C)  TempSrc:  Oral  Oral  SpO2:  95%  98%  Weight:  101.7 kg    Height:        Intake/Output Summary (Last 24 hours) at 06/20/2018 1451 Last data filed at 06/20/2018 0808 Gross per 24 hour  Intake 480 ml  Output 1500 ml  Net -1020 ml   Filed Weights   06/18/18 2214 06/19/18 0512 06/20/18 0613  Weight: 104.4 kg 101.8 kg 101.7 kg    Examination:  General exam:  Resting comfortably, of oxygen Respiratory system: Clear to auscultation. Respiratory effort normal. Cardiovascular system: S1 & S2 heard, RRR. No JVD, murmurs, rubs, gallops or clicks. No pedal edema. Gastrointestinal system: Abdomen is nondistended, soft and nontender. Normal bowel sounds heard. Central nervous system: Alert and oriented. No focal neurological deficits. Extremities:  Bipedal pitting edema(+) Skin: No rashes, lesions or ulcers Psychiatry: Judgement and insight appear normal. Mood & affect appropriate.     Data Reviewed: I have personally reviewed following labs and imaging studies  CBC: Recent Labs  Lab 06/18/18 1629 06/19/18 0614  WBC 6.6 6.4  HGB  13.1 11.7*  HCT 40.4 36.5*  MCV 94.2 91.7  PLT 156 852*   Basic Metabolic Panel: Recent Labs  Lab 06/18/18 1629 06/18/18 2326 06/19/18 0614  NA 132*  --  135  K 3.9  --  3.2*  CL 97*  --  100  CO2 28  --  24  GLUCOSE 169*  --  92  BUN 29*  --  25*  CREATININE 1.45*  --  1.20  CALCIUM 9.0  --  8.5*  MG  --  2.0  --    GFR: Estimated Creatinine Clearance: 73 mL/min  (by C-G formula based on SCr of 1.2 mg/dL). Liver Function Tests: Recent Labs  Lab 06/18/18 2326  AST 27  ALT 16  ALKPHOS 146*  BILITOT 2.3*  PROT 7.0  ALBUMIN 3.5   No results for input(s): LIPASE, AMYLASE in the last 168 hours. No results for input(s): AMMONIA in the last 168 hours. Coagulation Profile: No results for input(s): INR, PROTIME in the last 168 hours. Cardiac Enzymes: Recent Labs  Lab 06/18/18 2326 06/19/18 0614 06/19/18 1212  TROPONINI 0.04* 0.04* 0.04*   BNP (last 3 results) No results for input(s): PROBNP in the last 8760 hours. HbA1C: No results for input(s): HGBA1C in the last 72 hours. CBG: Recent Labs  Lab 06/19/18 1206 06/19/18 1634 06/19/18 2118 06/20/18 0732 06/20/18 1227  GLUCAP 104* 108* 109* 111* 129*   Lipid Profile: No results for input(s): CHOL, HDL, LDLCALC, TRIG, CHOLHDL, LDLDIRECT in the last 72 hours. Thyroid Function Tests: Recent Labs    06/18/18 2326  TSH 2.355   Anemia Panel: No results for input(s): VITAMINB12, FOLATE, FERRITIN, TIBC, IRON, RETICCTPCT in the last 72 hours.  Sepsis Labs: Recent Labs  Lab 06/18/18 1629 06/19/18 0614  WBC 6.6 6.4    No results found for this or any previous visit (from the past 240 hour(s)).       Radiology Studies: Dg Chest 2 View  Result Date: 06/18/2018 CLINICAL DATA:  Chest pain and history of CHF EXAM: CHEST - 2 VIEW COMPARISON:  02/08/2016 FINDINGS: Marked cardiomegaly, unchanged. There is pulmonary vascular congestion without overt edema. No pleural effusion or pneumothorax. No focal consolidation. IMPRESSION: Cardiomegaly without overt pulmonary edema. Electronically Signed   By: Ulyses Jarred M.D.   On: 06/18/2018 16:35   Dg Abdomen 1 View  Result Date: 06/18/2018 CLINICAL DATA:  Constipation. EXAM: ABDOMEN - 1 VIEW COMPARISON:  CT abdomen and pelvis 02/10/2016 FINDINGS: Gas is present in scattered loops of small and large bowel without significant bowel dilatation seen  to indicate obstruction. A moderate amount of colonic stool is noted at the level of the splenic flexure. There is also a moderate amount of stool in the rectum. No gross intraperitoneal free air is evident on this supine study. Lumbar spondylosis is noted. IMPRESSION: Moderate amount of left-sided colonic and rectal stool without evidence of obstruction. Electronically Signed   By: Logan Bores M.D.   On: 06/18/2018 17:12   US Abdomen Complete  Result Date: 06/19/2018 CLINICAL DATA:  Abdominal pain for 6 months. EXAM: ABDOMEN ULTRASOUND COMPLETE COMPARISON:  MRI of the abdomen 06/16/2017 FINDINGS: Gallbladder: The gallbladder is mildly contracted. Wall thickness is upper limits of normal to slightly thickened at 3.5 mm. No stones or polyps are present. Common bile duct: Diameter: 4.5 mm, within normal limits. There is a mildly nodular appearance of the liver. No discrete lesions are present. Parenchyma is of normal echotexture. Liver: No focal lesion identified. Within  normal limits in parenchymal echogenicity. Portal vein is patent on color Doppler imaging with normal direction of blood flow towards the liver. IVC: No abnormality visualized. Pancreas: The pancreas is not visualized due to overlying bowel gas. Spleen: Size and appearance within normal limits. Right Kidney: Length: 11.2 cm, within normal limits. Echogenicity within normal limits. No mass or hydronephrosis visualized. Left Kidney: Length: 12.4 cm, within normal limits. Echogenicity within normal limits. No mass or hydronephrosis visualized. Abdominal aorta: No aneurysm visualized. The midportion of the aorta is poorly visualized due to overlying bowel gas. Other findings: Moderate abdominal ascites are again noted. IMPRESSION: 1. Moderate abdominal ascites. 2. Somewhat nodular appearance of the liver.  Question cirrhosis. 3. Gallbladder wall thickening is upper limits of normal. This is likely exaggerated due to contracted state of the  gallbladder. Electronically Signed   By: San Morelle M.D.   On: 06/19/2018 09:40   [place] Ekg Site Rite  Result Date: 06/20/2018 If Site Rite image not attached, placement could not be confirmed due to current cardiac rhythm.       Scheduled Meds: . atorvastatin  80 mg Oral q1800  . carvedilol  3.125 mg Oral BID WC  . ferrous gluconate  324 mg Oral Q breakfast  . furosemide  40 mg Intravenous Q12H  . insulin aspart  0-9 Units Subcutaneous TID WC  . potassium chloride  10 mEq Oral Daily  . rivaroxaban  20 mg Oral Q supper   Continuous Infusions:   LOS: 2 days    Time spent: 25 mins    Benito Mccreedy, MD [facility] Pager [phone]  If 7PM-7AM, please contact night-coverage [email or web address] Password [email or web address] 06/20/2018, 2:51 PM

## 2018-06-20 NOTE — Progress Notes (Signed)
Clarise Cruz RN notified PICC is good to use and to d/c PIV.

## 2018-06-20 NOTE — Progress Notes (Signed)
Peripherally Inserted Central Catheter/Midline Placement  The IV Nurse has discussed with the patient and/or persons authorized to consent for the patient, the purpose of this procedure and the potential benefits and risks involved with this procedure.  The benefits include less needle sticks, lab draws from the catheter, and the patient may be discharged home with the catheter. Risks include, but not limited to, infection, bleeding, blood clot (thrombus formation), and puncture of an artery; nerve damage and irregular heartbeat and possibility to perform a PICC exchange if needed/ordered by physician.  Alternatives to this procedure were also discussed.  Bard Power PICC patient education guide, fact sheet on infection prevention and patient information card has been provided to patient /or left at bedside.  Donnie Aho #[id number] provided translation for this procedure.  Pt, wife and son at bedside; all questions answered to their satisfaction.  PICC/Midline Placement Documentation  PICC Double Lumen 59/09/31 PICC Right Basilic 42 cm 0 cm (Active)  Indication for Insertion or Continuance of Line Vasoactive infusions;Chronic illness with exacerbations (CF, Sickle Cell, etc.) 06/20/2018  4:25 PM  Exposed Catheter (cm) 0 cm 06/20/2018  4:25 PM  Site Assessment Clean;Dry;Intact 06/20/2018  4:25 PM  Lumen #1 Status Flushed;Saline locked;Blood return noted 06/20/2018  4:25 PM  Lumen #2 Status Flushed;Saline locked;Blood return noted 06/20/2018  4:25 PM  Dressing Type Transparent 06/20/2018  4:25 PM  Dressing Status Clean;Dry;Intact;Antimicrobial disc in place 06/20/2018  4:25 PM  Line Care Connections checked and tightened 06/20/2018  4:25 PM  Line Adjustment (NICU/IV Team Only) No 06/20/2018  4:25 PM  Dressing Intervention New dressing 06/20/2018  4:25 PM  Dressing Change Due 06/27/18 06/20/2018  4:25 PM       Rolena Infante 06/20/2018, 4:26 PM

## 2018-06-20 NOTE — Progress Notes (Signed)
The patient now wishes to stay at [facility] for cardiac care. Plan to place PICC line today and obtain Co-Ox. Will ask the Advanced HF service to see in the am tomorrow. Continue current therapy.  Pixie Casino, MD, [facility], [place] Director of the Advanced Lipid Disorders &  Cardi[facility] Clinic Diplomate of the American Board of Clinical Lipidology Attending Cardiologist  Direct Dial: [phone]  Fax: [phone]  Website:  [email or web address]

## 2018-06-20 NOTE — Progress Notes (Signed)
RN rounded on pt. Pt states she does not need anything at this time. 

## 2018-06-20 NOTE — Progress Notes (Signed)
  Echocardiogram 2D Echocardiogram has been performed.  Dawnyel Leven L Androw 06/20/2018, 9:03 AM

## 2018-06-21 DIAGNOSIS — I10 Essential (primary) hypertension: Secondary | ICD-10-CM

## 2018-06-21 DIAGNOSIS — I482 Chronic atrial fibrillation, unspecified: Secondary | ICD-10-CM

## 2018-06-21 DIAGNOSIS — Z95828 Presence of other vascular implants and grafts: Secondary | ICD-10-CM

## 2018-06-21 LAB — CBC
HEMATOCRIT: 38.9 % — AB (ref 39.0–52.0)
Hemoglobin: 12.7 g/dL — ABNORMAL LOW (ref 13.0–17.0)
MCH: 30.2 pg (ref 26.0–34.0)
MCHC: 32.6 g/dL (ref 30.0–36.0)
MCV: 92.4 fL (ref 80.0–100.0)
Platelets: 120 10*3/uL — ABNORMAL LOW (ref 150–400)
RBC: 4.21 MIL/uL — ABNORMAL LOW (ref 4.22–5.81)
RDW: 14.6 % (ref 11.5–15.5)
WBC: 5.2 10*3/uL (ref 4.0–10.5)
nRBC: 0 % (ref 0.0–0.2)

## 2018-06-21 LAB — MAGNESIUM: Magnesium: 2.2 mg/dL (ref 1.7–2.4)

## 2018-06-21 LAB — COMPREHENSIVE METABOLIC PANEL
ALBUMIN: 3.4 g/dL — AB (ref 3.5–5.0)
ALT: 25 U/L (ref 0–44)
ANION GAP: 12 (ref 5–15)
AST: 37 U/L (ref 15–41)
Alkaline Phosphatase: 128 U/L — ABNORMAL HIGH (ref 38–126)
BILIRUBIN TOTAL: 2.8 mg/dL — AB (ref 0.3–1.2)
BUN: 30 mg/dL — AB (ref 8–23)
CHLORIDE: 94 mmol/L — AB (ref 98–111)
CO2: 24 mmol/L (ref 22–32)
Calcium: 8.5 mg/dL — ABNORMAL LOW (ref 8.9–10.3)
Creatinine, Ser: 1.61 mg/dL — ABNORMAL HIGH (ref 0.61–1.24)
GFR calc Af Amer: 48 mL/min — ABNORMAL LOW (ref >60)
GFR calc non Af Amer: 41 mL/min — ABNORMAL LOW (ref >60)
GLUCOSE: 111 mg/dL — AB (ref 70–99)
Potassium: 3.8 mmol/L (ref 3.5–5.1)
SODIUM: 130 mmol/L — AB (ref 135–145)
TOTAL PROTEIN: 6.5 g/dL (ref 6.5–8.1)

## 2018-06-21 LAB — GLUCOSE, CAPILLARY
GLUCOSE-CAPILLARY: 147 mg/dL — AB (ref 70–99)
GLUCOSE-CAPILLARY: 135 mg/dL — AB (ref 70–99)
Glucose-Capillary: 140 mg/dL — ABNORMAL HIGH (ref 70–99)
Glucose-Capillary: 128 mg/dL — ABNORMAL HIGH (ref 70–99)

## 2018-06-21 MED ORDER — POTASSIUM CHLORIDE CRYS ER 20 MEQ PO TBCR
20.0000 meq | EXTENDED_RELEASE_TABLET | Freq: Every day | ORAL | Status: DC
  Administered 2018-06-22: 20 meq via ORAL
  Filled 2018-06-21 (×3): qty 1

## 2018-06-21 MED ORDER — FUROSEMIDE 10 MG/ML IJ SOLN
80.0000 mg | Freq: Two times a day (BID) | INTRAMUSCULAR | Status: DC
  Administered 2018-06-21 – 2018-06-22 (×2): 80 mg via INTRAVENOUS
  Filled 2018-06-21 (×2): qty 8

## 2018-06-21 NOTE — Consult Note (Addendum)
Advanced Heart Failure Team Consult Note   Primary Physician: Mack Hook, MD PCP-Cardiologist:  Dr Philbert Riser.  Reason for Consultation: Heart Failure   HPI:    Kyle Burke is seen today for evaluation of heart failure at the request of Dr Debara Pickett.   Kyle Burke is a 72 year old [place] speaking male with a history of  chronic systolic heart failure, NICM,  LBBB,  DMII, chroinc A fib, and chronic anticoagulation with xarelto. He does not have medical insurance. He is not a [place] citizen. He requires assistance with medications.   Over the last few weeks he has had increased shortness of breath with exertion. Works 1-2 days a week in [employer]. Lives with his wife. He does not smoke or drink alcohol.   He has been followed by Dr Philbert Riser at [facility]. He was last seen 01/2018. He was stable at that time. Weight was 220 pounds.   Over the last few weeks he has had increased shortness of breath with exertion. Works 1-2 days a week in [employer]. Lives with his wife. He does not smoke or drink alcohol. Weight at home had gone up 222-->230 pounds. Says he does not run out of his medications.   He presented with increased dyspnea and fatigue. Admitted with A/C systolic heart failure. CXR with  He has been diuresing with IV lasix. CXR with cardiomegaly. Abd xray with moderate stool.  Pertinent admission labs included: troponin 0/.04>0.04, Mag 2, sodium 132, creatinine 1.45, and BNP >4500. EKG showed  A Fib with wide QRS. Yesterday cardiology consulted. PICC line was placed. CO-OX was 71% so milrinone was not started. He continues to diurese with IV lasix. Weight has gone up 3 pounds but he is negative 1.4 liters.   Feeling better today. Denies SOB. Nausea resolved. Hungry.   Echo 06/2018  Compared to a prior echo in 2017, the LVEF is further decreased   to 15% with severe global hypokinesis, inferior akinesis and   septal dyskinesis.RV mildly reduced.   ECHO 2017 [place] EF 20-25%  RV mildly dilated. Severe hypokinesis.  R[facility]/ [facility] 2018 at [facility] Nonobs LAD 50%,mild om2.  RA 12 PCWP 31   Review of Systems: [y] = yes, [ ]  = no   General: Weight gain [Y ]; Weight loss [ ] ; Anorexia [ ] ; Fatigue [ Y]; Fever [ ] ; Chills [ ] ; Weakness [ ]   Cardiac: Chest pain/pressure [ ] ; Resting SOB [ ] ; Exertional SOB [Y ]; Orthopnea [ ] ; Pedal Edema [ ] ; Palpitations [ ] ; Syncope [ ] ; Presyncope [ ] ; Paroxysmal nocturnal dyspnea[ ]   Pulmonary: Cough [ ] ; Wheezing[ ] ; Hemoptysis[ ] ; Sputum [ ] ; Snoring [ ]   GI: Vomiting[ ] ; Dysphagia[ ] ; Melena[ ] ; Hematochezia [ ] ; Heartburn[ ] ; Abdominal pain [ ] ; Constipation [ ] ; Diarrhea [ ] ; BRBPR [ ]   GU: Hematuria[ ] ; Dysuria [ ] ; Nocturia[ ]   Vascular: Pain in legs with walking [ ] ; Pain in feet with lying flat [ ] ; Non-healing sores [ ] ; Stroke [ ] ; TIA [ ] ; Slurred speech [ ] ;  Neuro: Headaches[ ] ; Vertigo[ ] ; Seizures[ ] ; Paresthesias[ ] ;Blurred vision [ ] ; Diplopia [ ] ; Vision changes [ ]   Ortho/Skin: Arthritis [ ] ; Joint pain [ Y]; Muscle pain [ ] ; Joint swelling [ ] ; Back Pain [ ] ; Rash [ ]   Psych: Depression[ ] ; Anxiety[ ]   Heme: Bleeding problems [ ] ; Clotting disorders [ ] ; Anemia [ ]   Endocrine: Diabetes [Y ]; Thyroid dysfunction[ ]   Home  Medications Prior to Admission medications   Medication Sig Start Date End Date Taking? Authorizing Provider  acetaminophen (TYLENOL) 650 MG CR tablet Take 650 mg by mouth every 8 (eight) hours as needed for pain. Reported on 02/14/2016   Yes [provider]  atorvastatin (LIPITOR) 80 MG tablet Take 1 tablet (80 mg total) by mouth daily. Patient taking differently: Take 80 mg by mouth every evening.  08/14/17  Yes Mack Hook, MD  carvedilol (COREG) 3.125 MG tablet Take 1 tablet (3.125 mg total) by mouth 2 (two) times daily with a meal. 10/20/17  Yes Mack Hook, MD  ferrous gluconate (FERGON) 324 MG tablet Take 1 tablet (324 mg total) by mouth daily with breakfast.  02/09/17  Yes Mack Hook, MD  furosemide (LASIX) 40 MG tablet 2 tabs by mouth twice daily Patient taking differently: Take 80 mg by mouth 2 (two) times daily.  09/16/17  Yes Mack Hook, MD  Menthol-Camphor 3-3 % GEL Apply to knees as needed for pain Patient taking differently: Apply 1 application topically daily as needed (knee pain).  01/08/17  Yes Mack Hook, MD  metFORMIN (GLUCOPHAGE) 500 MG tablet Take 1 tablet (500 mg total) by mouth 2 (two) times daily with a meal. 02/19/18  Yes Mack Hook, MD  potassium chloride (K-DUR) 10 MEQ tablet Take 1 tablet (10 mEq total) by mouth daily. 12/24/16  Yes Mack Hook, MD  vitamin C (ASCORBIC ACID) 500 MG tablet Take 500 mg by mouth daily.   Yes [provider]  XARELTO 20 MG TABS tablet TAKE 1 TABLET BY MOUTH ONCE DAILY WITH SUPPER Patient taking differently: Take 20 mg by mouth daily with supper.  10/28/17  Yes Mack Hook, MD  Blood Glucose Monitoring Suppl (AGAMATRIX PRESTO PRO METER) DEVI Twice daily blood glucose checks before meals 08/15/16   Mack Hook, MD  glucose blood (AGAMATRIX PRESTO TEST) test strip Twice daily glucose checks before meals 08/15/16   Mack Hook, MD    Past Medical History: Past Medical History:  Diagnosis Date  . Atrial fibrillation ([place]) 2016  . CAD (coronary artery disease)   . Diabetes mellitus 2001  . Hypertension 2006  . 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 Acie Fredrickson, Cardiology  EF 25%  Adenosine Cardiolite did not support ischemic etiology  . OA (osteoarthritis) of knee 01/08/2017  . Pleural effusion     Past Surgical History: Past Surgical History:  Procedure Laterality Date  . COLONOSCOPY WITH PROPOFOL N/A 05/14/2016   Procedure: COLONOSCOPY WITH PROPOFOL;  Surgeon: Jerene Bears, MD;  Location: [facility];  Service: Gastroenterology;  Laterality: N/A;  . ESOPHAGOGASTRODUODENOSCOPY (EGD) WITH PROPOFOL N/A 05/14/2016     Procedure: ESOPHAGOGASTRODUODENOSCOPY (EGD) WITH PROPOFOL;  Surgeon: Jerene Bears, MD;  Location: [facility];  Service: Gastroenterology;  Laterality: N/A;  . HERNIA REPAIR    . INCISE AND DRAIN ABCESS  1986   on leg  . INGUINAL HERNIA REPAIR  01/16/2012   Procedure: LAPAROSCOPIC INGUINAL HERNIA;  Surgeon: Gayland Curry, MD,FACS;  Location: [facility];  Service: General;  Laterality: Left;  Laparoscopic incarcerated Left inguinal hernia repair with mesh, umbilical hernia repair  . UMBILICAL HERNIA REPAIR  01/16/2012   Procedure: HERNIA REPAIR UMBILICAL ADULT;  Surgeon: Gayland Curry, MD,FACS;  Location: [facility];  Service: General;  Laterality: N/A;    Family History: Family History  Problem Relation Age of Onset  . Stroke Mother   . Heart disease Mother   . Heart disease Father   .  Diabetes Father   . Kidney disease Father        , history of stones as well  . Diabetes Brother   . Cancer Sister 46       unknown soft tissue cancer of leg    Social History: Social History   Socioeconomic History  . Marital status: Married    Spouse name: Not on file  . Number of children: 5  . Years of education: 9  . Highest education level: Not on file  Occupational History  . Occupation: Ambulance person constrution    Comment: Barista  Social Needs  . Financial resource strain: Not on file  . Food insecurity:    Worry: Not on file    Inability: Not on file  . Transportation needs:    Medical: Not on file    Non-medical: Not on file  Tobacco Use  . Smoking status: Never Smoker  . Smokeless tobacco: Never Used  Substance and Sexual Activity  . Alcohol use: No    Alcohol/week: 0.0 standard drinks    Comment: rare occ., formerly more  . Drug use: No  . Sexual activity: Yes    Comment: But not regular  Lifestyle  . Physical activity:    Days per week: Not on file    Minutes per session: Not on file  . Stress: Not on file  Relationships  . Social connections:    Talks on phone:  Not on file    Gets together: Not on file    Attends religious service: Not on file    Active member of club or organization: Not on file    Attends meetings of clubs or organizations: Not on file    Relationship status: Not on file  Other Topics Concern  . Not on file  Social History Narrative   Originally from [place]   Came to [place] In 1995   Lives at home with wife, a son and his wife and 3 grandchildren.    Allergies:  No Known Allergies  Objective:    Vital Signs:   Temp:  [98 F (36.7 C)-99.8 F (37.7 C)] 99.4 F (37.4 C) (11/04 0530) Pulse Rate:  [85-99] 89 (11/04 0530) Resp:  [18-20] 18 (11/04 0530) BP: (95-98)/(70-83) 96/70 (11/04 0530) SpO2:  [88 %-100 %] 88 % (11/04 0530) Weight:  [103 kg] 103 kg (11/04 0530) Last BM Date: 06/20/18  Weight change: Filed Weights   06/19/18 0512 06/20/18 0613 06/21/18 0530  Weight: 101.8 kg 101.7 kg 103 kg    Intake/Output:   Intake/Output Summary (Last 24 hours) at 06/21/2018 0842 Last data filed at 06/21/2018 0533 Gross per 24 hour  Intake 480 ml  Output 1900 ml  Net -1420 ml      Physical Exam    General:  No resp difficulty HEENT: normal Neck: supple. JVP to jaw . Carotids 2+ bilat; no bruits. No lymphadenopathy or thyromegaly appreciated. Cor: PMI nondisplaced. Irregular rate & rhythm. No rubs, gallops or murmurs. Lungs: clear Abdomen: soft, nontender, nondistended. No hepatosplenomegaly. No bruits or masses. Good bowel sounds. Extremities: warm, no cyanosis, clubbing, rash, edema Neuro: alert & orientedx3, cranial nerves grossly intact. moves all 4 extremities w/o difficulty. Affect pleasant   Telemetry  A fib 60-70s   EKG    06/18/2018 A fib 68 bpm QRS 200 ms  Labs   Basic Metabolic Panel: Recent Labs  Lab 06/18/18 1629 06/18/18 2326 06/19/18 0614  NA 132*  --  135  K 3.9  --  3.2*  CL 97*  --  100  CO2 28  --  24  GLUCOSE 169*  --  92  BUN 29*  --  25*  CREATININE 1.45*  --  1.20  CALCIUM  9.0  --  8.5*  MG  --  2.0  --     Liver Function Tests: Recent Labs  Lab 06/18/18 2326  AST 27  ALT 16  ALKPHOS 146*  BILITOT 2.3*  PROT 7.0  ALBUMIN 3.5   No results for input(s): LIPASE, AMYLASE in the last 168 hours. No results for input(s): AMMONIA in the last 168 hours.  CBC: Recent Labs  Lab 06/18/18 1629 06/19/18 0614  WBC 6.6 6.4  HGB 13.1 11.7*  HCT 40.4 36.5*  MCV 94.2 91.7  PLT 156 145*    Cardiac Enzymes: Recent Labs  Lab 06/18/18 2326 06/19/18 0614 06/19/18 1212  TROPONINI 0.04* 0.04* 0.04*    BNP: BNP (last 3 results) Recent Labs    06/18/18 1630  BNP >4,500.0*    ProBNP (last 3 results) No results for input(s): PROBNP in the last 8760 hours.   CBG: Recent Labs  Lab 06/20/18 0732 06/20/18 1227 06/20/18 1641 06/20/18 2148 06/21/18 0736  GLUCAP 111* 129* 108* 150* 140*    Coagulation Studies: No results for input(s): LABPROT, INR in the last 72 hours.   Imaging   Dg Chest Port 1 View  Result Date: 06/20/2018 CLINICAL DATA:  Check PICC line placement EXAM: PORTABLE CHEST 1 VIEW COMPARISON:  06/18/2018 FINDINGS: PICC line is now seen with the tip at the cavoatrial junction. Cardiac shadow remains enlarged. The lungs are well aerated with minimal platelike atelectasis in the left base. No other focal abnormality is noted. IMPRESSION: Status post PICC line in satisfactory position. Electronically Signed   By: Inez Catalina M.D.   On: 06/20/2018 17:06   [place] Ekg Site Rite  Result Date: 06/20/2018 If Site Rite image not attached, placement could not be confirmed due to current cardiac rhythm.     Medications:     Current Medications: . atorvastatin  80 mg Oral q1800  . carvedilol  3.125 mg Oral BID WC  . ferrous gluconate  324 mg Oral Q breakfast  . furosemide  40 mg Intravenous Q12H  . insulin aspart  0-9 Units Subcutaneous TID WC  . potassium chloride  10 mEq Oral Daily  . rivaroxaban  20 mg Oral Q supper  . sodium  chloride flush  10-40 mL Intracatheter Q12H     Infusions:     Patient Profile  Kyle Burke is a 72 year old spanish speaking male with a history of  chronic systolic heart failure, NICM,  LBBB,  DMII, chroinc A fib, and chronic anticoagulation with xarelto. He does not have medical insurance. He is not a [place] citizen. He requires assistance with medications.   Admitted with A/C systolic heart failure. Diuresing with IV lasix.    Assessment/Plan   1. A/C Systolic Heart Failure, NICM. LHC 2018 50% LAD.  Wide QRS 200 ms.  Admitted with volume overload. Remains volume overloaded. Increase lasix to 80 mg twice a day.  CO-OX on 11/3 was 71%. Check CO-OX now  - Continue low dose carvedilol 3.125 mg twice a day.  - Heart rate in the 60s hold off on digioxin.  - Creatinine trending up 1.2>1.6  - SBP soft and creatinine trending up. No room for arb or spiro.  - strict I/O and daily standing weight.   2.  LBBB -QRS 200 ms -BiV discussed by [facility]ology at [facility]. He does not have medical insurance and he is not a [place] citizen so this was not pursued.   -I have contacted [facility] with EP to discuss possible charity case. Plan to send him as an outpatient to Dr Lovena Le for consideration.   3. Chronic A fib -Rate controlled.  -On xarelto.   4. AKI Creatinine 2018 was 1.1  - Creatinine trending up form 1.2>1.6.   5. DMII Continue SSI   Social barrier--> no medical insurance and he is not a [place] citizen.    Medication concerns reviewed with patient and pharmacy team. Barriers identified: Yes  Length of Stay: [place], NP  06/21/2018, 8:42 AM  Advanced Heart Failure Team Pager [phone] (M-F; 7a - 4p)  Please contact [facility] for night-coverage after hours (4p -7a ) and weekends on [email or web address]  Patient seen and examined with Darrick Grinder, NP. We discussed all aspects of the encounter. I agree with the assessment and plan as stated above.   72 y/o male with chronic AF and  advanced systolic HF due to NICM with severe biventricular dysfunction. EF 15% on echo with severe Kyle and severe RV HK. Was previously followed by Dr. Philbert Riser at [facility] and was set up for CRT device however patient did not f/u. Now admitted with recurrent NYHA IIIB-IV symptoms. ECG with very wide IVCD (227ms),   On exam  Fatigued appearing JVP 8-9 Cor laterally displaced IRR 2/6 Kyle +s3 Lungs clear Ab soft NT Extremities warm with no edema  He has near end-stage HF with EF 15% and severe Kyle and severe RV dysfunction. Options very limited at this time. I have d/w Dr. Philbert Riser at [facility] and Dr. Lovena Le (EP). Here I think his only hope for symptomatic improvement is CRT if he will comply. We will plan to optimize meds prior to d/c then have he seen in f/u with EP next 1-2 weeks to arrange outpatient CRT placement. Would use CRT-P and not CRT-D (I.e. No shock capabilities given severity of his HF).   Glori Bickers, MD  2:31 PM

## 2018-06-21 NOTE — Care Management Note (Signed)
Case Management Note  Patient Details  Name: Kyle Burke MRN: [id number] Date of Birth: [birth year]  Subjective/Objective:    CHF               Action/Plan: Patient lives with spouse; no medical insurance and he is not a [place] citizen. 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 Pt can follow up at the [facility] for [facility]. CM will continue to follow for progression of care.   Expected Discharge Date:   possibly 06/26/2018               Expected Discharge Plan:  Home/Self Care  Discharge planning Services  CM Consult  Status of Service:  In process, will continue to follow  Royston Bake [place] ,[place] [phone] 06/21/2018, 9:52 AM

## 2018-06-21 NOTE — Consult Note (Addendum)
ELECTROPHYSIOLOGY CONSULT NOTE    Patient ID: Frederic Tones MRN: [id number], DOB/AGE: 71-13-1947 71 y.o.  Admit date: 06/18/2018 Date of Consult: 06/21/2018  Primary Physician: Mack Hook, MD Primary Cardiologist: Bensimhon (previously followed at [facility]) Electrophysiologist: Lovena Le (new this admission)  Patient Profile: Kyle Burke is a 72 y.o. male with a history of NICM, chronic systolic heart failure, diabetes, permanent atrial fibrillation who is being seen today for the evaluation of NICM/CHF at the request of Dr Haroldine Laws.  HPI:  Kyle Burke is a 72 y.o. male with the above past medical history. He has lived in the [place] for 25 years but does not have insurance and is not a citizen.  He is seen with his daughter today who assists with translation.  He presented to the hospital with increased shortness of breath with exertion. He continues to work in Architect.  Weight was up and he has been compliant with meds. He has been diursed. Because of long standing cardiomyopathy and IVCD with very wide QRS, EP has been asked to evaluate for CRT placement.    Echocardiogram this admission demonstrates EF 15%, severely dilated LA, PA pressure 65, moderate TR  He denies chest pain, palpitations, dyspnea, PND, orthopnea, nausea, vomiting, dizziness, syncope, edema, weight gain, or early satiety.  Past Medical History:  Diagnosis Date  . Atrial fibrillation ([place]) 2016  . CAD (coronary artery disease)   . Diabetes mellitus 2001  . Hypertension 2006  . Idiopathic cardiomyopathy ([place]) 11/01/2014   Followed by Dr. Acie Fredrickson, Cardiology  EF 25%  Adenosine Cardiolite did not support ischemic etiology  . OA (osteoarthritis) of knee 01/08/2017  . Pleural effusion      Surgical History:  Past Surgical History:  Procedure Laterality Date  . COLONOSCOPY WITH PROPOFOL N/A 05/14/2016   Procedure: COLONOSCOPY WITH PROPOFOL;  Surgeon: Jerene Bears, MD;  Location: [facility]  ENDOSCOPY;  Service: Gastroenterology;  Laterality: N/A;  . ESOPHAGOGASTRODUODENOSCOPY (EGD) WITH PROPOFOL N/A 05/14/2016   Procedure: ESOPHAGOGASTRODUODENOSCOPY (EGD) WITH PROPOFOL;  Surgeon: Jerene Bears, MD;  Location: [facility] ENDOSCOPY;  Service: Gastroenterology;  Laterality: N/A;  . HERNIA REPAIR    . INCISE AND DRAIN ABCESS  1986   on leg  . INGUINAL HERNIA REPAIR  01/16/2012   Procedure: LAPAROSCOPIC INGUINAL HERNIA;  Surgeon: Gayland Curry, MD,FACS;  Location: [facility] ORS;  Service: General;  Laterality: Left;  Laparoscopic incarcerated Left inguinal hernia repair with mesh, umbilical hernia repair  . UMBILICAL HERNIA REPAIR  01/16/2012   Procedure: HERNIA REPAIR UMBILICAL ADULT;  Surgeon: Gayland Curry, MD,FACS;  Location: [facility] ORS;  Service: General;  Laterality: N/A;     Medications Prior to Admission  Medication Sig Dispense Refill Last Dose  . acetaminophen (TYLENOL) 650 MG CR tablet Take 650 mg by mouth every 8 (eight) hours as needed for pain. Reported on 02/14/2016   unk  . atorvastatin (LIPITOR) 80 MG tablet Take 1 tablet (80 mg total) by mouth daily. (Patient taking differently: Take 80 mg by mouth every evening. ) 30 tablet 11 06/17/2018  . carvedilol (COREG) 3.125 MG tablet Take 1 tablet (3.125 mg total) by mouth 2 (two) times daily with a meal. 60 tablet 11 06/18/2018 at AM  . ferrous gluconate (FERGON) 324 MG tablet Take 1 tablet (324 mg total) by mouth daily with breakfast.  3 06/17/2018  . furosemide (LASIX) 40 MG tablet 2 tabs by mouth twice daily (Patient taking differently: Take 80 mg by mouth 2 (two) times daily. )  120 tablet 11 06/17/2018  . Menthol-Camphor 3-3 % GEL Apply to knees as needed for pain (Patient taking differently: Apply 1 application topically daily as needed (knee pain). )  0 unk  . metFORMIN (GLUCOPHAGE) 500 MG tablet Take 1 tablet (500 mg total) by mouth 2 (two) times daily with a meal. 60 tablet 6 06/17/2018  . potassium chloride (K-DUR) 10 MEQ tablet Take 1  tablet (10 mEq total) by mouth daily. 30 tablet 11 06/17/2018  . vitamin C (ASCORBIC ACID) 500 MG tablet Take 500 mg by mouth daily.   06/16/2018  . XARELTO 20 MG TABS tablet TAKE 1 TABLET BY MOUTH ONCE DAILY WITH SUPPER (Patient taking differently: Take 20 mg by mouth daily with supper. ) 30 tablet 11 06/17/2018 at 2000  . Blood Glucose Monitoring Suppl (AGAMATRIX PRESTO PRO METER) DEVI Twice daily blood glucose checks before meals 1 Device 0 Taking  . glucose blood (AGAMATRIX PRESTO TEST) test strip Twice daily glucose checks before meals 100 each 12 Taking    Inpatient Medications:  . atorvastatin  80 mg Oral q1800  . carvedilol  3.125 mg Oral BID WC  . ferrous gluconate  324 mg Oral Q breakfast  . furosemide  80 mg Intravenous Q12H  . insulin aspart  0-9 Units Subcutaneous TID WC  . [START ON 06/22/2018] potassium chloride  20 mEq Oral Daily  . rivaroxaban  20 mg Oral Q supper  . sodium chloride flush  10-40 mL Intracatheter Q12H    Allergies: No Known Allergies  Social History   Socioeconomic History  . Marital status: Married    Spouse name: Not on file  . Number of children: 5  . Years of education: 9  . Highest education level: Not on file  Occupational History  . Occupation: Ambulance person constrution    Comment: Barista  Social Needs  . Financial resource strain: Not on file  . Food insecurity:    Worry: Not on file    Inability: Not on file  . Transportation needs:    Medical: Not on file    Non-medical: Not on file  Tobacco Use  . Smoking status: Never Smoker  . Smokeless tobacco: Never Used  Substance and Sexual Activity  . Alcohol use: No    Alcohol/week: 0.0 standard drinks    Comment: rare occ., formerly more  . Drug use: No  . Sexual activity: Yes    Comment: But not regular  Lifestyle  . Physical activity:    Days per week: Not on file    Minutes per session: Not on file  . Stress: Not on file  Relationships  . Social connections:    Talks on  phone: Not on file    Gets together: Not on file    Attends religious service: Not on file    Active member of club or organization: Not on file    Attends meetings of clubs or organizations: Not on file    Relationship status: Not on file  . Intimate partner violence:    Fear of current or ex partner: Not on file    Emotionally abused: Not on file    Physically abused: Not on file    Forced sexual activity: Not on file  Other Topics Concern  . Not on file  Social History Narrative   Originally from [place]   Came to [place] In 1995   Lives at home with wife, a son and his wife and 3 grandchildren.  Family History  Problem Relation Age of Onset  . Stroke Mother   . Heart disease Mother   . Heart disease Father   . Diabetes Father   . Kidney disease Father        , history of stones as well  . Diabetes Brother   . Cancer Sister 62       unknown soft tissue cancer of leg     Review of Systems: All other systems reviewed and are otherwise negative except as noted above.  Physical Exam: Vitals:   06/20/18 1650 06/20/18 1948 06/21/18 0530 06/21/18 0942  BP: 98/83 96/72 96/70    Pulse: 99 85 89   Resp: 18 18 18    Temp: 98 F (36.7 C) 99.8 F (37.7 C) 99.4 F (37.4 C)   TempSrc: Oral Oral Oral   SpO2: 100% 99% (!) 88%   Weight:   103 kg 103.5 kg  Height:        GEN- The patient is well appearing, alert and oriented x 3 today.   HEENT: normocephalic, atraumatic; sclera clear, conjunctiva pink; hearing intact; oropharynx clear; neck supple Lungs- Clear to ausculation bilaterally, normal work of breathing.  No wheezes, rales, rhonchi Heart- Regular rate and rhythm  GI- soft, non-tender, non-distended, bowel sounds present Extremities- no clubbing, cyanosis, or edema  MS- no significant deformity or atrophy Skin- warm and dry, no rash or lesion Psych- euthymic mood, full affect Neuro- strength and sensation are intact  Labs:   Lab Results  Component Value Date    WBC 5.2 06/21/2018   HGB 12.7 (L) 06/21/2018   HCT 38.9 (L) 06/21/2018   MCV 92.4 06/21/2018   PLT 120 (L) 06/21/2018    Recent Labs  Lab 06/21/18 0832  NA 130*  K 3.8  CL 94*  CO2 24  BUN 30*  CREATININE 1.61*  CALCIUM 8.5*  PROT 6.5  BILITOT 2.8*  ALKPHOS 128*  ALT 25  AST 37  GLUCOSE 111*      Radiology/Studies: Dg Chest 2 View  Result Date: 06/18/2018 CLINICAL DATA:  Chest pain and history of CHF EXAM: CHEST - 2 VIEW COMPARISON:  02/08/2016 FINDINGS: Marked cardiomegaly, unchanged. There is pulmonary vascular congestion without overt edema. No pleural effusion or pneumothorax. No focal consolidation. IMPRESSION: Cardiomegaly without overt pulmonary edema. Electronically Signed   By: Ulyses Jarred M.D.   On: 06/18/2018 16:35   Dg Abdomen 1 View  Result Date: 06/18/2018 CLINICAL DATA:  Constipation. EXAM: ABDOMEN - 1 VIEW COMPARISON:  CT abdomen and pelvis 02/10/2016 FINDINGS: Gas is present in scattered loops of small and large bowel without significant bowel dilatation seen to indicate obstruction. A moderate amount of colonic stool is noted at the level of the splenic flexure. There is also a moderate amount of stool in the rectum. No gross intraperitoneal free air is evident on this supine study. Lumbar spondylosis is noted. IMPRESSION: Moderate amount of left-sided colonic and rectal stool without evidence of obstruction. Electronically Signed   By: Logan Bores M.D.   On: 06/18/2018 17:12   US Abdomen Complete  Result Date: 06/19/2018 CLINICAL DATA:  Abdominal pain for 6 months. EXAM: ABDOMEN ULTRASOUND COMPLETE COMPARISON:  MRI of the abdomen 06/16/2017 FINDINGS: Gallbladder: The gallbladder is mildly contracted. Wall thickness is upper limits of normal to slightly thickened at 3.5 mm. No stones or polyps are present. Common bile duct: Diameter: 4.5 mm, within normal limits. There is a mildly nodular appearance of the liver. No discrete lesions are  present. Parenchyma is  of normal echotexture. Liver: No focal lesion identified. Within normal limits in parenchymal echogenicity. Portal vein is patent on color Doppler imaging with normal direction of blood flow towards the liver. IVC: No abnormality visualized. Pancreas: The pancreas is not visualized due to overlying bowel gas. Spleen: Size and appearance within normal limits. Right Kidney: Length: 11.2 cm, within normal limits. Echogenicity within normal limits. No mass or hydronephrosis visualized. Left Kidney: Length: 12.4 cm, within normal limits. Echogenicity within normal limits. No mass or hydronephrosis visualized. Abdominal aorta: No aneurysm visualized. The midportion of the aorta is poorly visualized due to overlying bowel gas. Other findings: Moderate abdominal ascites are again noted. IMPRESSION: 1. Moderate abdominal ascites. 2. Somewhat nodular appearance of the liver.  Question cirrhosis. 3. Gallbladder wall thickening is upper limits of normal. This is likely exaggerated due to contracted state of the gallbladder. Electronically Signed   By: San Morelle M.D.   On: 06/19/2018 09:40   Dg Chest Port 1 View  Result Date: 06/20/2018 CLINICAL DATA:  Check PICC line placement EXAM: PORTABLE CHEST 1 VIEW COMPARISON:  06/18/2018 FINDINGS: PICC line is now seen with the tip at the cavoatrial junction. Cardiac shadow remains enlarged. The lungs are well aerated with minimal platelike atelectasis in the left base. No other focal abnormality is noted. IMPRESSION: Status post PICC line in satisfactory position. Electronically Signed   By: Inez Catalina M.D.   On: 06/20/2018 17:06   [place] Ekg Site Rite  Result Date: 06/20/2018 If Site Rite image not attached, placement could not be confirmed due to current cardiac rhythm.   JFH:LKTGYB fibrillation, IVCD, QRS 253msec (personally reviewed)  TELEMETRY: atrial fibrillation with controlled ventricular rate  (personally reviewed)   Assessment/Plan: 1.   NICM/CHF/IVCD The patient has a NICM despite guideline directed therapy. EF is 15% by echo this admission.  While he does not have classic LBBB, his QRS is very wide with IVCD and there is potential he would benefit from CRT therapy with HIS/LV/RV lead.  With likely poor long term prognosis, would not pursue ICD implant at this time. Will arrange for outpatient EP follow up to discuss further and schedule.  Risks, benefits reviewed with patient and family who wish to proceed. Will try to obtain device through indigent program.   2.  Permanent atrial fibrillation LA size is 75 Would not attempt to restore SR Continue Xarelto for CHADS2VASC of at least 4   EP will sign off.   Medication Recommendations:  none Other recommendations (labs, testing, etc):  none Follow up as an outpatient:  Follow up with EP next week in office (scheduled and entered in AVS)  For questions or updates, please contact [place] Please consult [email or web address] for contact info under Cardiology/STEMI.  Signed, Chanetta Marshall, NP 06/21/2018 11:28 AM   EP consult note  Patient seen and examined.  Agree with the findings as noted above.  The patient has acute on chronic systolic heart failure, chronic atrial fibrillation, who is been admitted for additional evaluation secondary to acute systolic heart failure.  He has severe left ventricular systolic function, and a prolonged QRS of 200 ms.  Previously he had left bundle branch block.  Currently he has an intraventricular conduction delay which I suspect involves both his left as well as his right bundle.  He has not had syncope.  There is been no sustained ventricular tachycardia.  His exam as noted above.  In short he has a chronically ill-appearing 72 year old man  with an irregularly irregular rhythm, rales in the bases, minimal peripheral edema, and nonfocal neurologic exam. I discussed the treatment options with the patient and his family in detail.  We will see him  back in the office in the next couple of weeks and plan to proceed with biventricular pacemaker insertion to try and improve his symptoms.  Cristopher Peru, MD

## 2018-06-21 NOTE — Progress Notes (Addendum)
CARDIAC REHAB PHASE I   PRE:  Rate/Rhythm: 77 afib    BP: sitting 97/77    SaO2: 96 RA  MODE:  Ambulation: 270 ft   POST:  Rate/Rhythm: 115 afib    BP: sitting 84/71     SaO2: 96 RA   Pt reluctant to walk, feels tired and weak. Agreed if he could use RW. Able to stand and walk with RW, slow and steady. Denied dizziness or significant SOB. Only sts he feels weak. To recliner. Gave low sodium diets in [place]. They cook at home and watch sodium. He weighs daily as well. He does not want a RW at this time. I will f/u tomorrow. [place], ACSM 06/21/2018 2:36 PM

## 2018-06-21 NOTE — Progress Notes (Signed)
PROGRESS NOTE   Kyle Burke  OHY:[id number]    DOB: 01-14-1946    DOA: 06/18/2018  PCP: Mack Hook, MD   I have briefly reviewed patients previous medical records in [facility].  Brief Narrative:  72 year old Spanish-speaking male with PMH of NICM, TTE 01/2016: LVEF 94-85%, chronic systolic CHF, LBBB, CAD, DM 2, HTN, persistent A. fib on anticoagulation with Xarelto, stage III chronic kidney disease, anemia, usually follows with Dr. Philbert Riser, Cardiology at [facility] and was being evaluated for CRT-D therapy, presented to [facility] ED 06/18/2018 due to fatigue, DOE and 15 pound weight gain over several weeks.  Admitted for acute on chronic systolic CHF, repeat TTE shows LVEF 15%, cardiology assisted with management and have consulted Advanced HF team for input.  Improving.   Assessment & Plan:   Principal Problem:   Acute on chronic systolic CHF (congestive heart failure) (HCC) Active Problems:   Atrial fibrillation (HCC)   HTN (hypertension)   LBBB (left bundle branch block)   Controlled type 2 diabetes mellitus with stage 3 chronic kidney disease, without long-term current use of insulin (HCC)   Acute on chronic systolic CHF/NICM: History as noted above.  Previous TTE 2017 had LVEF 15-20%, TTE 06/20/2018: LVEF 15% with wall motion abnormalities.  Treated with IV Lasix 40 mg every 12 hourly.  -3.6 L since admission.  Weight may not be accurate because he has not really lost any despite diuresis.  Advanced HF team input appreciated, remains volume overloaded, Lasix increased to 80 mg twice daily, continue low-dose carvedilol 3.125 mg twice daily, holding off on digoxin since heart rate in the 60s.  Co-ox 11/3 was 71% and hence no milrinone.  LBBB: As per cardiology, BiV discussed by cardiology at [facility] but since he lacks insurance and is not a [place] citizen, this was not pursued.  Advanced HF team have contacted EP Cardiology and plan to send him as outpatient as possible charity  case.  Persistent A. fib: Controlled ventricular rate.  Continue Xarelto.  Acute kidney injury on stage III chronic kidney disease: Last normal creatinine was in May 2018/0.97.  Presented with creatinine of 1.3/1.19, now up to 1.6.  Likely related to cardiorenal syndrome, had some hypotension issues on 11/3?  ATN.  No ACEI/ARB.  Lasix increased.  Monitor BMP closely.  Essential HTN: Soft blood pressures.  Monitor closely.  DM2: Reasonable inpatient control.  Continue SSI.  Metformin held.  Hypokalemia: Replaced.  Magnesium 2.2.  Thrombocytopenia: Unclear etiology.  Monitor closely.  No bleeding reported.  Isolated hyperbilirubinemia: Could be due to passive hepatic congestion from CHF.  Periodically follow LFTs.  Hyperlipidemia: Statins.  Normocytic anemia: Stable   DVT prophylaxis: Xarelto Code Status: Full Family Communication: Discussed in detail with patient's daughter-in-law at bedside who speaks [place].  Updated care and answered questions. Disposition: DC home pending clinical improvement.   Consultants:  Cardiology Advanced HF  Procedures:  Right upper extremity PICC line 11/3  Antimicrobials:  None   Subjective: Patient interviewed and examined with assistance of daughter-in-law at bedside as interpreter.  He prefers that over video interpreter.  Reports feeling better.  Dyspnea improved but feels fatigued/DOE on mild exertion.  No chest pain.  No other complaints reported.  ROS: As above, otherwise negative.  Objective:  Vitals:   06/20/18 1650 06/20/18 1948 06/21/18 0530 06/21/18 0942  BP: 98/83 96/72 96/70    Pulse: 99 85 89   Resp: 18 18 18    Temp: 98 F (36.7 C) 99.8 F (37.7  C) 99.4 F (37.4 C)   TempSrc: Oral Oral Oral   SpO2: 100% 99% (!) 88%   Weight:   103 kg 103.5 kg  Height:        Examination:  General exam: Pleasant elderly male, moderately built and overweight, lying comfortably supine in bed. Respiratory system: Occasional fine  basal crackles but otherwise clear to auscultation. Respiratory effort normal. Cardiovascular system: S1 & S2 heard, RRR.  JVD +.  No murmurs, rubs, gallops or clicks.  Trace bilateral ankle edema.  Telemetry personally reviewed: A. fib with BBB morphology and ventricular rate ranging between 80-100s. Gastrointestinal system: Abdomen is nondistended, soft and nontender. No organomegaly or masses felt. Normal bowel sounds heard. Central nervous system: Alert and oriented. No focal neurological deficits. Extremities: Symmetric 5 x 5 power. Skin: No rashes, lesions or ulcers Psychiatry: Judgement and insight appear normal. Mood & affect appropriate.     Data Reviewed: I have personally reviewed following labs and imaging studies  CBC: Recent Labs  Lab 06/18/18 1629 06/19/18 0614 06/21/18 0832  WBC 6.6 6.4 5.2  HGB 13.1 11.7* 12.7*  HCT 40.4 36.5* 38.9*  MCV 94.2 91.7 92.4  PLT 156 145* 295*   Basic Metabolic Panel: Recent Labs  Lab 06/18/18 1629 06/18/18 2326 06/19/18 0614 06/21/18 0832  NA 132*  --  135 130*  K 3.9  --  3.2* 3.8  CL 97*  --  100 94*  CO2 28  --  24 24  GLUCOSE 169*  --  92 111*  BUN 29*  --  25* 30*  CREATININE 1.45*  --  1.20 1.61*  CALCIUM 9.0  --  8.5* 8.5*  MG  --  2.0  --  2.2   Liver Function Tests: Recent Labs  Lab 06/18/18 2326 06/21/18 0832  AST 27 37  ALT 16 25  ALKPHOS 146* 128*  BILITOT 2.3* 2.8*  PROT 7.0 6.5  ALBUMIN 3.5 3.4*   Cardiac Enzymes: Recent Labs  Lab 06/18/18 2326 06/19/18 0614 06/19/18 1212  TROPONINI 0.04* 0.04* 0.04*   HbA1C: No results for input(s): HGBA1C in the last 72 hours. CBG: Recent Labs  Lab 06/20/18 0732 06/20/18 1227 06/20/18 1641 06/20/18 2148 06/21/18 0736  GLUCAP 111* 129* 108* 150* 140*    No results found for this or any previous visit (from the past 240 hour(s)).       Radiology Studies: Dg Chest Port 1 View  Result Date: 06/20/2018 CLINICAL DATA:  Check PICC line placement  EXAM: PORTABLE CHEST 1 VIEW COMPARISON:  06/18/2018 FINDINGS: PICC line is now seen with the tip at the cavoatrial junction. Cardiac shadow remains enlarged. The lungs are well aerated with minimal platelike atelectasis in the left base. No other focal abnormality is noted. IMPRESSION: Status post PICC line in satisfactory position. Electronically Signed   By: Inez Catalina M.D.   On: 06/20/2018 17:06   [place] Ekg Site Rite  Result Date: 06/20/2018 If Site Rite image not attached, placement could not be confirmed due to current cardiac rhythm.       Scheduled Meds: . atorvastatin  80 mg Oral q1800  . carvedilol  3.125 mg Oral BID WC  . ferrous gluconate  324 mg Oral Q breakfast  . furosemide  80 mg Intravenous Q12H  . insulin aspart  0-9 Units Subcutaneous TID WC  . [START ON 06/22/2018] potassium chloride  20 mEq Oral Daily  . rivaroxaban  20 mg Oral Q supper  . sodium chloride flush  10-40 mL Intracatheter Q12H   Continuous Infusions:   LOS: 3 days     Vernell Leep, MD, FACP, [facility]. [facility] Pager [phone]  If 7PM-7AM, please contact night-coverage [email or web address] Password [email or web address] 06/21/2018, 10:53 AM

## 2018-06-22 LAB — BASIC METABOLIC PANEL
Anion gap: 10 (ref 5–15)
BUN: 35 mg/dL — AB (ref 8–23)
CHLORIDE: 96 mmol/L — AB (ref 98–111)
CO2: 24 mmol/L (ref 22–32)
CREATININE: 1.57 mg/dL — AB (ref 0.61–1.24)
Calcium: 8.3 mg/dL — ABNORMAL LOW (ref 8.9–10.3)
GFR calc Af Amer: 49 mL/min — ABNORMAL LOW (ref >60)
GFR calc non Af Amer: 43 mL/min — ABNORMAL LOW (ref >60)
GLUCOSE: 110 mg/dL — AB (ref 70–99)
Potassium: 3.5 mmol/L (ref 3.5–5.1)
Sodium: 130 mmol/L — ABNORMAL LOW (ref 135–145)

## 2018-06-22 LAB — GLUCOSE, CAPILLARY
GLUCOSE-CAPILLARY: 143 mg/dL — AB (ref 70–99)
Glucose-Capillary: 113 mg/dL — ABNORMAL HIGH (ref 70–99)
Glucose-Capillary: 124 mg/dL — ABNORMAL HIGH (ref 70–99)
Glucose-Capillary: 131 mg/dL — ABNORMAL HIGH (ref 70–99)

## 2018-06-22 MED ORDER — LOSARTAN POTASSIUM 25 MG PO TABS
12.5000 mg | ORAL_TABLET | Freq: Every day | ORAL | Status: DC
  Administered 2018-06-22 – 2018-06-23 (×2): 12.5 mg via ORAL
  Filled 2018-06-22 (×2): qty 1

## 2018-06-22 MED ORDER — POTASSIUM CHLORIDE CRYS ER 20 MEQ PO TBCR
40.0000 meq | EXTENDED_RELEASE_TABLET | Freq: Once | ORAL | Status: AC
  Administered 2018-06-22: 40 meq via ORAL

## 2018-06-22 MED ORDER — FUROSEMIDE 10 MG/ML IJ SOLN
80.0000 mg | Freq: Two times a day (BID) | INTRAMUSCULAR | Status: DC
  Administered 2018-06-22: 80 mg via INTRAVENOUS
  Filled 2018-06-22: qty 8

## 2018-06-22 MED ORDER — SPIRONOLACTONE 12.5 MG HALF TABLET
12.5000 mg | ORAL_TABLET | Freq: Every day | ORAL | Status: DC
  Administered 2018-06-22: 12.5 mg via ORAL
  Filled 2018-06-22 (×2): qty 1

## 2018-06-22 MED ORDER — METOLAZONE 2.5 MG PO TABS
2.5000 mg | ORAL_TABLET | Freq: Once | ORAL | Status: AC
  Administered 2018-06-22: 2.5 mg via ORAL
  Filled 2018-06-22: qty 1

## 2018-06-22 NOTE — Progress Notes (Signed)
CARDIAC REHAB PHASE I   PRE:  Rate/Rhythm: 87 afib    BP: sitting 94/73    SaO2: 96 RA  MODE:  Ambulation: 240 ft   POST:  Rate/Rhythm: 117 afib    BP: sitting 90/78     SaO2: 98 RA  I came for second time today, pt still reluctant to walk. Feels tired and weak. Used RW and gait belt. Able to support himself and steady but seemed to require much effort to pick up feet to take steps. Return to bed with BP slightly lower. Denied dizziness or SOB. Got pt a hot, fresh lunch but he did not eat it immediately, needed to rest. Encouraged more walking and reading HF information. [place], ACSM 06/22/2018 2:17 PM

## 2018-06-22 NOTE — Progress Notes (Addendum)
Advanced Heart Failure Rounding Note  PCP-Cardiologist: No primary care provider on file.   Subjective:    Remains on IV lasix. Negative 1 L. Weight unchanged.   Interpreter used via [employer] a "little bit" better today. Denies SOB at rest, lightheadedness or dizziness. No orthopnea or PND. Feels like swelling has improved "very mildly".   Cr 1.6 -> 1.57. K 3.5.  Objective:   Weight Range: 103.5 kg Body mass index is 28.52 kg/m.   Vital Signs:   Temp:  [98.1 F (36.7 C)-100.3 F (37.9 C)] 100.3 F (37.9 C) (11/05 0627) Pulse Rate:  [71-94] 92 (11/05 0627) Resp:  [18] 18 (11/05 0627) BP: (99-129)/(75-114) 116/83 (11/05 0627) SpO2:  [96 %-99 %] 99 % (11/05 0627) Weight:  [103.5 kg] 103.5 kg (11/05 0627) Last BM Date: 06/20/18  Weight change: Filed Weights   06/21/18 0530 06/21/18 0942 06/22/18 0627  Weight: 103 kg 103.5 kg 103.5 kg    Intake/Output:   Intake/Output Summary (Last 24 hours) at 06/22/2018 0817 Last data filed at 06/22/2018 0647 Gross per 24 hour  Intake 600 ml  Output 1450 ml  Net -850 ml      Physical Exam    General:  Well appearing. No resp difficulty HEENT: Normal Neck: Supple. JVP at least 10 cm. Carotids 2+ bilat; no bruits. No lymphadenopathy or thyromegaly appreciated. Cor: PMI nondisplaced. Irregularly irregular. +s3 Lungs: Clear Abdomen: Soft, nontender, nondistended. No hepatosplenomegaly. No bruits or masses. Good bowel sounds. Extremities: No cyanosis, clubbing, or rash. Trace to 1+ BLE edema. Neuro: Alert & orientedx3, cranial nerves grossly intact. moves all 4 extremities w/o difficulty. Affect pleasant   Telemetry   Afib 60-70s, personally reviewed.   EKG    No new tracings.    Labs    CBC Recent Labs    06/21/18 0832  WBC 5.2  HGB 12.7*  HCT 38.9*  MCV 92.4  PLT 983*   Basic Metabolic Panel Recent Labs    06/21/18 0832 06/22/18 0242  NA 130* 130*  K 3.8 3.5  CL 94* 96*  CO2 24 24  GLUCOSE  111* 110*  BUN 30* 35*  CREATININE 1.61* 1.57*  CALCIUM 8.5* 8.3*  MG 2.2  --    Liver Function Tests Recent Labs    06/21/18 0832  AST 37  ALT 25  ALKPHOS 128*  BILITOT 2.8*  PROT 6.5  ALBUMIN 3.4*   No results for input(s): LIPASE, AMYLASE in the last 72 hours. Cardiac Enzymes Recent Labs    06/19/18 1212  TROPONINI 0.04*    BNP: BNP (last 3 results) Recent Labs    06/18/18 1630  BNP >4,500.0*    ProBNP (last 3 results) No results for input(s): PROBNP in the last 8760 hours.   D-Dimer No results for input(s): DDIMER in the last 72 hours. Hemoglobin A1C No results for input(s): HGBA1C in the last 72 hours. Fasting Lipid Panel No results for input(s): CHOL, HDL, LDLCALC, TRIG, CHOLHDL, LDLDIRECT in the last 72 hours. Thyroid Function Tests No results for input(s): TSH, T4TOTAL, T3FREE, THYROIDAB in the last 72 hours.  Invalid input(s): FREET3  Other results:   Imaging     No results found.   Medications:     Scheduled Medications: . atorvastatin  80 mg Oral q1800  . carvedilol  3.125 mg Oral BID WC  . ferrous gluconate  324 mg Oral Q breakfast  . furosemide  80 mg Intravenous Q12H  . insulin aspart  0-9  Units Subcutaneous TID WC  . potassium chloride  20 mEq Oral Daily  . rivaroxaban  20 mg Oral Q supper  . sodium chloride flush  10-40 mL Intracatheter Q12H     Infusions:   PRN Medications:  acetaminophen **OR** acetaminophen, sodium chloride flush    Patient Profile   Mr [place] Render is a 72 year old spanish speaking male with a history of  chronic systolic heart failure, NICM,  LBBB,  DMII, chronic A fib, and chronic anticoagulation with xarelto. He does not have medical insurance. He is not a [place] citizen. He requires assistance with medications.   Admitted with A/C systolic heart failure. Diuresing with IV lasix.   Assessment/Plan   1. A/C Systolic Heart Failure, NICM. LHC 2018 50% LAD.  - Wide QRS 200 ms.  - Volume status  remains elevated.  - Continue IV lasix 80 mg BID for at least today.  - Give metolazone 2.5 mg x 1 and extra 40 meq of K for supp.  - Continue carvedilol 3.125 mg BID - Heart rate in the 60s hold off on digoxin.  - Creatinine trending up 1.2 > 1.6 > 1.57 - Start low dose losartan at 12.5 mg qhs. - Add spiro 12.5 mg daily.  - Strict I/O and daily standing weight.  2. LBBB - QRS 200 ms - EP has seen. Plan to see as an outpatient (if compliant) and schedule CRT-P as able as charity case.  3. Chronic A fib - Rate controlled.  - No bleeding on xarelto.  4. AKI - Creatinine 2018 was 1.1  - Creatinine 1.2>1.6 >1.57 5. DMII - SSI per primary.   Continue to diurese at least one more day. Adjust meds as tolerated.   Medication concerns reviewed with patient and pharmacy team. Barriers identified: Language barrier. Compliance.   Length of Stay: 4  Annamaria Helling  06/22/2018, 8:17 AM  Advanced Heart Failure Team Pager [phone] (M-F; 7a - 4p)  Please contact [facility] for night-coverage after hours (4p -7a ) and weekends on [email or web address]   Patient seen and examined with the above-signed Advanced Practice Provider and/or Housestaff. I personally reviewed laboratory data, imaging studies and relevant notes. I independently examined the patient and formulated the important aspects of the plan. I have edited the note to reflect any of my changes or salient points. I have personally discussed the plan with the patient and/or family.  Remains volume overloaded. Will continue IV lasix and give a dose of metolazone today. Watch renal function closely. Add spiro 12.5. Discussed with EP and will plan CRT as outpatient.   Glori Bickers, MD  1:52 PM

## 2018-06-22 NOTE — Progress Notes (Signed)
PROGRESS NOTE   Kyle Burke  WCB:[id number]    DOB: [birth year]    DOA: 06/18/2018  PCP: Mack Hook, MD   I have briefly reviewed patients previous medical records in [facility].  Brief Narrative:  72 year old Spanish-speaking male with PMH of NICM, TTE 01/2016: LVEF 61-60%, chronic systolic CHF, LBBB, CAD, DM 2, HTN, persistent A. fib on anticoagulation with Xarelto, stage III chronic kidney disease, anemia, usually follows with Dr. Philbert Riser, Cardiology at [facility] and was being evaluated for CRT-D therapy, presented to [facility] 06/18/2018 due to fatigue, DOE and 15 pound weight gain over several weeks.  Admitted for acute on chronic systolic CHF, repeat TTE shows LVEF 15%, cardiology assisted with management and have consulted Advanced HF team for input.  Improving slowly.   Assessment & Plan:   Principal Problem:   Acute on chronic systolic CHF (congestive heart failure) (HCC) Active Problems:   Atrial fibrillation (HCC)   HTN (hypertension)   LBBB (left bundle branch block)   Controlled type 2 diabetes mellitus with stage 3 chronic kidney disease, without long-term current use of insulin (HCC)   Acute on chronic systolic CHF/NICM: History as noted above.  Previous TTE 2017 had LVEF 15-20%, TTE 06/20/2018: LVEF 15% with wall motion abnormalities.  Treated with IV Lasix 40 mg every 12 hourly.  -3.6 L since admission.  Weight may not be accurate because he has not really lost any despite diuresis.  Advanced HF team follow-up appreciated, remains volume overloaded, Lasix continued 80 mg twice daily, continue low-dose carvedilol 3.125 mg twice daily, holding off on digoxin since heart rate in the 60s.  Co-ox 11/3 was 71% and hence no milrinone.  On 11/5, cardiology gave a dose of metolazone 2.5 mg, started low-dose losartan 12.5 nightly and added spironolactone 12.5 daily.  Management as per cardiology.  LBBB: As per cardiology, BiV discussed by cardiology at [facility] but since he  lacks insurance and is not a [place] citizen, this was not pursued.  EP cardiology input appreciated, plan to follow-up outpatient and schedule CRT-P as able as charity case.  Persistent A. fib: Controlled ventricular rate.  Continue Xarelto.  Stable.  Acute kidney injury on stage III chronic kidney disease: Last normal creatinine was in May 2018/0.97.  Presented with creatinine of 1.3/1.19, now up to 1.6.  Likely related to cardiorenal syndrome, had some hypotension issues on 11/3?  ATN.  No ACEI/ARB.  Lasix increased.  Creatinine about the same as yesterday.  Continue to monitor closely.  Essential HTN: Blood pressures better.  DM2: Reasonable inpatient control.  Continue SSI.  Metformin held.  No change.  Hypokalemia: Magnesium 2.2.  Patient getting an extra dose of potassium today and cardiology have started losartan and Aldactone.  Monitor BMP closely.  Thrombocytopenia: Unclear etiology.  Monitor closely.  No bleeding reported.  No CBCs today, check in a.m.  Isolated hyperbilirubinemia: Could be due to passive hepatic congestion from CHF.  Periodically follow LFTs.  Hyperlipidemia: Statins.  Normocytic anemia: Stable   DVT prophylaxis: Xarelto Code Status: Full Family Communication: Discussed in detail with patient's daughter-in-law at bedside who speaks [place].  Updated care and answered questions. Disposition: DC home pending clinical improvement.   Consultants:  Cardiology Advanced HF  Procedures:  Right upper extremity PICC line 11/3  Antimicrobials:  None   Subjective: Patient interviewed and examined with assistance of daughter-in-law at bedside as interpreter.  He prefers that over video interpreter.  Reports feeling better.  No dyspnea at rest.  Still has dyspnea on exertion.  No chest pain.  Wanted to know when he is going home.  ROS: As above, otherwise negative.  Objective:  Vitals:   06/21/18 1701 06/21/18 2010 06/22/18 0627 06/22/18 1213  BP: 106/75 99/76  116/83 102/73  Pulse: 90 71 92 81  Resp:  18 18   Temp:  98.1 F (36.7 C) 100.3 F (37.9 C) 99.5 F (37.5 C)  TempSrc:  Oral Oral Oral  SpO2:  99% 99% 98%  Weight:   103.5 kg   Height:        Examination:  General exam: Pleasant elderly male, moderately built and overweight, sitting up comfortably in bed. Respiratory system: Clear to auscultation. Respiratory effort normal. Cardiovascular system: S1 & S2 heard, RRR.  JVD +.  No murmurs, rubs, gallops or clicks.  Trace bilateral ankle edema.  Telemetry personally reviewed: A. fib/BBB morphology with controlled ventricular rate. Gastrointestinal system: Abdomen is nondistended, soft and nontender. No organomegaly or masses felt. Normal bowel sounds heard.  Stable. Central nervous system: Alert and oriented. No focal neurological deficits.  Stable. Extremities: Symmetric 5 x 5 power. Skin: No rashes, lesions or ulcers Psychiatry: Judgement and insight appear normal. Mood & affect appropriate.     Data Reviewed: I have personally reviewed following labs and imaging studies  CBC: Recent Labs  Lab 06/18/18 1629 06/19/18 0614 06/21/18 0832  WBC 6.6 6.4 5.2  HGB 13.1 11.7* 12.7*  HCT 40.4 36.5* 38.9*  MCV 94.2 91.7 92.4  PLT 156 145* 035*   Basic Metabolic Panel: Recent Labs  Lab 06/18/18 1629 06/18/18 2326 06/19/18 0614 06/21/18 0832 06/22/18 0242  NA 132*  --  135 130* 130*  K 3.9  --  3.2* 3.8 3.5  CL 97*  --  100 94* 96*  CO2 28  --  24 24 24   GLUCOSE 169*  --  92 111* 110*  BUN 29*  --  25* 30* 35*  CREATININE 1.45*  --  1.20 1.61* 1.57*  CALCIUM 9.0  --  8.5* 8.5* 8.3*  MG  --  2.0  --  2.2  --    Liver Function Tests: Recent Labs  Lab 06/18/18 2326 06/21/18 0832  AST 27 37  ALT 16 25  ALKPHOS 146* 128*  BILITOT 2.3* 2.8*  PROT 7.0 6.5  ALBUMIN 3.5 3.4*   Cardiac Enzymes: Recent Labs  Lab 06/18/18 2326 06/19/18 0614 06/19/18 1212  TROPONINI 0.04* 0.04* 0.04*   HbA1C: No results for  input(s): HGBA1C in the last 72 hours. CBG: Recent Labs  Lab 06/21/18 1127 06/21/18 1629 06/21/18 2153 06/22/18 0729 06/22/18 1211  GLUCAP 147* 135* 128* 113* 124*    No results found for this or any previous visit (from the past 240 hour(s)).       Radiology Studies: Dg Chest Port 1 View  Result Date: 06/20/2018 CLINICAL DATA:  Check PICC line placement EXAM: PORTABLE CHEST 1 VIEW COMPARISON:  06/18/2018 FINDINGS: PICC line is now seen with the tip at the cavoatrial junction. Cardiac shadow remains enlarged. The lungs are well aerated with minimal platelike atelectasis in the left base. No other focal abnormality is noted. IMPRESSION: Status post PICC line in satisfactory position. Electronically Signed   By: Inez Catalina M.D.   On: 06/20/2018 17:06        Scheduled Meds: . atorvastatin  80 mg Oral q1800  . carvedilol  3.125 mg Oral BID WC  . ferrous gluconate  324 mg Oral Q breakfast  .  furosemide  80 mg Intravenous BID  . insulin aspart  0-9 Units Subcutaneous TID WC  . losartan  12.5 mg Oral Daily  . metolazone  2.5 mg Oral Once  . potassium chloride  20 mEq Oral Daily  . rivaroxaban  20 mg Oral Q supper  . sodium chloride flush  10-40 mL Intracatheter Q12H  . spironolactone  12.5 mg Oral Daily   Continuous Infusions:   LOS: 4 days     Vernell Leep, MD, FACP, [facility]. [facility] Pager [phone]  If 7PM-7AM, please contact night-coverage [email or web address] Password [email or web address] 06/22/2018, 4:31 PM

## 2018-06-23 DIAGNOSIS — I5023 Acute on chronic systolic (congestive) heart failure: Secondary | ICD-10-CM

## 2018-06-23 DIAGNOSIS — N183 Chronic kidney disease, stage 3 (moderate): Secondary | ICD-10-CM

## 2018-06-23 DIAGNOSIS — I4811 Longstanding persistent atrial fibrillation: Secondary | ICD-10-CM

## 2018-06-23 DIAGNOSIS — I447 Left bundle-branch block, unspecified: Secondary | ICD-10-CM

## 2018-06-23 DIAGNOSIS — E1122 Type 2 diabetes mellitus with diabetic chronic kidney disease: Secondary | ICD-10-CM

## 2018-06-23 LAB — CBC
HCT: 37.8 % — ABNORMAL LOW (ref 39.0–52.0)
Hemoglobin: 12.2 g/dL — ABNORMAL LOW (ref 13.0–17.0)
MCH: 29.7 pg (ref 26.0–34.0)
MCHC: 32.3 g/dL (ref 30.0–36.0)
MCV: 92 fL (ref 80.0–100.0)
Platelets: 115 10*3/uL — ABNORMAL LOW (ref 150–400)
RBC: 4.11 MIL/uL — AB (ref 4.22–5.81)
RDW: 14.6 % (ref 11.5–15.5)
WBC: 4 10*3/uL (ref 4.0–10.5)
nRBC: 0 % (ref 0.0–0.2)

## 2018-06-23 LAB — GLUCOSE, CAPILLARY
GLUCOSE-CAPILLARY: 108 mg/dL — AB (ref 70–99)
GLUCOSE-CAPILLARY: 111 mg/dL — AB (ref 70–99)

## 2018-06-23 LAB — BASIC METABOLIC PANEL
ANION GAP: 7 (ref 5–15)
BUN: 36 mg/dL — ABNORMAL HIGH (ref 8–23)
CO2: 29 mmol/L (ref 22–32)
Calcium: 8.5 mg/dL — ABNORMAL LOW (ref 8.9–10.3)
Chloride: 97 mmol/L — ABNORMAL LOW (ref 98–111)
Creatinine, Ser: 1.58 mg/dL — ABNORMAL HIGH (ref 0.61–1.24)
GFR calc Af Amer: 49 mL/min — ABNORMAL LOW (ref >60)
GFR, EST NON AFRICAN AMERICAN: 42 mL/min — AB (ref >60)
GLUCOSE: 106 mg/dL — AB (ref 70–99)
Potassium: 3.1 mmol/L — ABNORMAL LOW (ref 3.5–5.1)
Sodium: 133 mmol/L — ABNORMAL LOW (ref 135–145)

## 2018-06-23 LAB — HEPATIC FUNCTION PANEL
ALK PHOS: 146 U/L — AB (ref 38–126)
ALT: 37 U/L (ref 0–44)
AST: 53 U/L — AB (ref 15–41)
Albumin: 3.1 g/dL — ABNORMAL LOW (ref 3.5–5.0)
BILIRUBIN INDIRECT: 1.7 mg/dL — AB (ref 0.3–0.9)
Bilirubin, Direct: 0.8 mg/dL — ABNORMAL HIGH (ref 0.0–0.2)
TOTAL PROTEIN: 6.1 g/dL — AB (ref 6.5–8.1)
Total Bilirubin: 2.5 mg/dL — ABNORMAL HIGH (ref 0.3–1.2)

## 2018-06-23 MED ORDER — FUROSEMIDE 40 MG PO TABS: 40.0000 mg | ORAL_TABLET | Freq: Two times a day (BID) | ORAL | Status: DC

## 2018-06-23 MED ORDER — LOSARTAN POTASSIUM 25 MG PO TABS: 12.5000 mg | ORAL_TABLET | Freq: Every day | ORAL | 0 refills | Status: DC

## 2018-06-23 MED ORDER — SPIRONOLACTONE 25 MG PO TABS
25.0000 mg | ORAL_TABLET | Freq: Every day | ORAL | Status: DC
  Administered 2018-06-23: 25 mg via ORAL
  Filled 2018-06-23: qty 1

## 2018-06-23 MED ORDER — FUROSEMIDE 40 MG PO TABS
40.0000 mg | ORAL_TABLET | Freq: Two times a day (BID) | ORAL | Status: DC
  Administered 2018-06-23: 40 mg via ORAL
  Filled 2018-06-23: qty 1

## 2018-06-23 MED ORDER — POTASSIUM CHLORIDE CRYS ER 20 MEQ PO TBCR
40.0000 meq | EXTENDED_RELEASE_TABLET | Freq: Two times a day (BID) | ORAL | Status: DC
  Filled 2018-06-23: qty 2

## 2018-06-23 MED ORDER — POTASSIUM CHLORIDE CRYS ER 20 MEQ PO TBCR
40.0000 meq | EXTENDED_RELEASE_TABLET | Freq: Once | ORAL | Status: AC
  Administered 2018-06-23: 40 meq via ORAL
  Filled 2018-06-23: qty 2

## 2018-06-23 MED ORDER — SPIRONOLACTONE 25 MG PO TABS: 25.0000 mg | ORAL_TABLET | Freq: Every day | ORAL | 0 refills | Status: DC

## 2018-06-23 MED ORDER — POTASSIUM CHLORIDE ER 10 MEQ PO TBCR: 20.0000 meq | EXTENDED_RELEASE_TABLET | Freq: Every day | ORAL | Status: DC

## 2018-06-23 MED ORDER — ATORVASTATIN CALCIUM 80 MG PO TABS: 80.0000 mg | ORAL_TABLET | Freq: Every evening | ORAL | Status: DC

## 2018-06-23 NOTE — Discharge Instructions (Signed)
Rivaroxaban oral tablets Qu es este medicamento? El RIVAROXABN es un anticoagulante (diluyente de la [place]). Se utiliza para tratar cogulos sanguneos en los pulmones o venas. Se utiliza tambin despus de una operacin de rodilla o cadera para evitar cogulos sanguneos. Se utiliza tambin para reducir la posibilidad de derrame cerebral en los pacientes con una afeccin mdica llamada fibrilacin auricular. Este medicamento puede ser utilizado para otros usos; si tiene alguna pregunta consulte con su proveedor de atencin mdica o con su farmacutico. MARCAS COMUNES: Xarelto, Xarelto Starter Pack [employer] debo informar a mi profesional de la salud antes de tomar este medicamento? Necesita saber si usted presenta alguno de los siguientes problemas o situaciones: trastornos de sangrado sangrado en el cerebro sangre en las heces (heces de color oscuro o de aspecto alquitranado) o si vomita sangre antecedentes de sangrado estomacal enfermedad renal enfermedad heptica conteos sanguneos bajos, como baja cantidad de glbulos blancos, glbulos rojos y plaquetas procedimiento epidural o vertebral reciente o programado toma medicamentos que tratan o previenen cogulos sanguneos una reaccin alrgica o inusual al rivaroxabn, otros medicamentos, alimentos, colorantes o conservantes si est embarazada o buscando quedar embarazada si est amamantando a un beb Cmo debo utilizar este medicamento? Tome este medicamento por va oral con un vaso de agua. Siga las instrucciones de la etiqueta del [place]. Tome su medicamento a intervalos regulares. No lo tome con una frecuencia mayor a la indicada. No deje de tomarlo, excepto si as lo indica su mdico. Dejar de tomar este medicamento podra aumentar su riesgo de tener un cogulo sanguneo. Asegrese de volver a surtir su receta antes de quedarse sin medicamento. Si est tomando este medicamento despus de [place] de reemplazo de cadera o rodilla, tmelo  con o sin alimentos. Si est tomando este medicamento para la fibrilacin auricular, tmelo con su comida de la noche. Si est tomando [employer] para tratar cogulos sanguneos, tmelo con alimentos a la [employer]. Si no puede tragar la tableta, puede triturarla y mezclarla con pur de [place]. Coma de inmediato el pur de [place]. Debe comer ms comida inmediatamente despus de comer el pur de manzana que contiene la tableta triturada. Hable con su pediatra para informarse acerca del uso de este medicamento en nios. Puede requerir atencin especial. Sobredosis: Pngase en contacto inmediatamente con un centro toxicolgico o una sala de urgencia si usted cree que haya tomado demasiado medicamento. ATENCIN: [employer] es solo para usted. No comparta este medicamento con nadie. Qu sucede si me olvido de una dosis? Si toma su medicamento una vez al da y se olvida una dosis, tome la dosis [place] tan pronto como se acuerde. Si toma su [place] y se Charissa Bash dosis, tome la dosis [place] inmediatamente. En este caso, puede tomarse 2 tabletas al [employer]. Al da siguiente debe tomar 1 tableta dos veces al [employer] indicado. Qu puede interactuar con este medicamento? No tome este medicamento con ninguno de los siguientes frmacos: defibrotida Este medicamento tambin puede interactuar con los siguientes medicamentos: aspirina y medicamentos tipo aspirina ciertos antibiticos, tales como eritromicina, acitromicina y Ambulance person ciertos medicamentos para infecciones micticas, tales como itraconazol y Photographer ciertos medicamentos para el pulso cardiaco irregular, tales como [place], quinidina, dronedarona ciertos medicamentos para convulsiones, tales como carbamazepina, fenitona ciertos medicamentos que tratan o previenen cogulos sanguneos, como warfarina, enoxaparina y dalteparina conivaptn diltiazem felodipina indinavir lopinavir;  ritonavir AINE, medicamentos para el dolor y la inflamacin, como ibuprofeno o naproxeno ranolazina  rifampicina ritonavir IRSN, medicamentos para la depresin, tales como desvenlafaxina, duloxetina, levomilnaciprn, venlafaxina ISRS, medicamentos para la depresin, tales como citaloprm, escitaloprm, fluoxetina, fluvoxamina, paroxetina, sertralina hierba de [place] verapamilo Puede ser que esta lista no menciona todas las posibles interacciones. Informe a su profesional de [employer]	[place] de [employer] productos a base de hierbas, medicamentos de [place] o suplementos nutritivos que est tomando. Si usted fuma, consume bebidas alcohlicas o si utiliza drogas ilegales, indqueselo tambin a su profesional de [employer]	[place]. Algunas sustancias pueden interactuar con su medicamento. A qu debo estar atento al usar Coca[employer]Cola? Visite a su mdico o a su profesional de la salud para revisar regularmente su evolucin. Notifique a su mdico o a su profesional de la salud y busque tratamiento mdico de emergencia si desarrolla problemas respiratorios; cambios en la visin; dolor en el pecho; dolor de cabeza repentino e intenso; dolor, hinchazn, calor en la pierna; problemas para hablar; entumecimiento o debilidad repentina en el rostro, el brazo o la pierna. Estos pueden ser signos de que su afeccin ha [employer]. Si va a someterse a una operacin u otro procedimiento, informe a su mdico que est tomando Coca-Cola. Qu efectos secundarios puedo tener al [employer] Corporation este medicamento? Efectos secundarios que debe informar a su mdico o a Barrister's clerk de la salud tan pronto como sea posible: Chief of Staff, como erupcin cutnea, picazn o urticarias, e hinchazn de la cara, los labios o la lengua dolor de espalda enrojecimiento, formacin de ampollas, descamacin o distensin de la piel, incluso dentro de la boca signos y sntomas de Teacher, music, tales como heces con sangre o de color negro y Curator  alquitranado; [place] de color rojo o Forensic psychologist oscuro; escupir sangre o Sales executive que tiene el aspecto de granos de caf molido; Tree surgeon rojas en la piel; sangrado o moretones inusuales en los ojos, las encas o la nariz Efectos secundarios que generalmente no requieren atencin mdica (infrmelos a su mdico o a Barrister's clerk de la salud si persisten o si son molestos): Leisure centre manager Puede ser que esta lista no menciona todos los posibles efectos secundarios. Comunquese a su mdico por asesoramiento mdico Humana Inc. Usted puede informar los efectos secundarios a la FDA por telfono al 1-800-FDA-1088. Dnde debo guardar mi medicina? Mantngala fuera del alcance de los nios. Gurdela a [employer], entre 15 y 41 grados C (56 y 63 grados F). Deseche todo el medicamento que no haya utilizado, despus de la fecha de vencimiento. ATENCIN: Este folleto es un resumen. Puede ser que no cubra toda la posible informacin. Si usted tiene preguntas acerca de esta medicina, consulte con su mdico, su farmacutico o su profesional de Technical sales engineer.  2018 Elsevier/Gold Standard (2016-09-04 00:00:00)     Insuficiencia cardaca (Heart Failure) Insuficiencia cardaca significa que el corazn tiene problemas para bombear la [place]. Esto dificulta el buen funcionamiento del organismo. La insuficiencia cardaca es una enfermedad de larga duracin (crnica). Es importante que se cuide mucho y que siga el plan de tratamiento que le indique el mdico. CUIDADOS EN EL [employer] medicamentos para el corazn tal como se los prescribi el mdico. ? No deje de tomar medicamentos excepto que su mdico lo indique ? No se saltee ninguna dosis del medicamento. ? Provase de los medicamentos antes de que se acaben. ? Tome medicamentos slo como lo indique su mdico o Development worker, international aid.  Permanezca activo si el mdico lo indica. Las Financial trader y los que tengan insuficiencia  cardaca grave deben hablar con el mdico acerca de la actividad fsica.  Consuma alimentos saludables para el corazn. Elija alimentos que no contengan grasas trans y sean bajas en grasas saturadas, colesterol y sal (sodio). Esto incluye frutas frescas o congeladas y vegetales, pescado, carnes [place], productos lcteos sin grasa o bajos en grasa, granos enteros y alimentos ricos en fibra. Las lentejas, arvejas y frijoles (legumbres) son tambin buenas opciones.  Limitar el consumo de sal segn lo aconsejado por su mdico.  Cocine en forma saludable. Prepare los alimentos asados, a la parrilla, al horno, hervidos, al vapor o salteados.  Limite los lquidos segn lo aconsejado por su mdico.  Controle su peso todas las [place]. Hgalo despus de hacer pis (orinar) y antes de tomar el desayuno. Anote su peso para llevarlo a la consulta con el mdico.  Tmese la presin arterial y antela, si su mdico se lo indica.  Pregunte al mdico como controlarse el pulso. Controle su pulso segn las indicaciones.  Baje de peso si el mdico se lo indica.  Deje de fumar o mascar tabaco. No use goma de Higher education careers adviser o parches para dejar de fumar sin la aprobacin de su mdico.  Programe y concurra a las citas con el mdico segn lo indicado.  Las mujeres no embarazadas no deben tomar ms de 1 bebida al [employer]. Los hombres no deben tomar ms de 2 bebidas al [employer]. Hable con su mdico acerca de su consumo de alcohol.  No consuma drogas.  [place] (immunizaciones).  Controle sus enfermedades segn lo indicado por su mdico.  Aprenda a Engineer, maintenance (IT).  Descanse cuando se sienta cansado.  Si hace mucho calor en el exterior: ? Evite las actividades intensas. ? Utilice aire acondicionado o ventiladores, o pngase en un lugar ms fresco. ? Evite la cafena y el alcohol. ? Use ropa holgada, ligera y de colores claros.  Si hace mucho fro en el exterior: ? Evite las actividades  intensas. ? Vstase con ropa en capas. ? Use mitones o guantes, un sombrero y [place] bufanda cuando salga. ? Evite el alcohol.  Aprenda todo sobre la insuficiencia cardaca y [place] apoyo si lo necesita.  Obtenga ayuda para mantener o mejorar su calidad de vida y su capacidad para cuidarse a s mismo, si lo necesita.  SOLICITE AYUDA SI:  Aumenta de peso rpidamente.  Le falta el aire ms que lo habitual.  No puede hacer sus actividades habituales.  Se cansa con facilidad.  Tose ms de lo normal, especialmente al realizar actividad fsica.  Observa que se le hinchan o le aumenta la hinchazn (inflamacin) en reas como las manos, los pies, los tobillos o el vientre (abdomen).  Le cuesta dormir debido a que Film/video editor.  Siente que el corazn palpita rpido (palpitaciones).  Siente mareos o vahdos al pararse.  SOLICITE AYUDA DE INMEDIATO SI:  Tiene dificultad para respirar.  Hay un cambio en su estado mental, como estar menos alerta o no poder concentrarse.  Siente dolor u opresin en el pecho.  Se desmaya.  ASEGRESE DE QUE:  Comprende estas instrucciones.  Controlar su enfermedad.  Solicitar ayuda de inmediato si no mejora o si empeora.  Esta informacin no tiene Marine scientist el consejo del mdico. Asegrese de hacerle al mdico cualquier pregunta que tenga. Document Released: 10/31/2008 Document Revised: 08/25/2014 Document Reviewed: 09/20/2012 Elsevier Interactive Patient Education  2017 [employer].

## 2018-06-23 NOTE — Progress Notes (Signed)
Patient discharged from unit to home with family. MEds provided for pt per heart fund. Translator provided thorough discharge information to patient. PICC line removed prior to d/c.   All personal items with patient.  NO distress noted.

## 2018-06-23 NOTE — Progress Notes (Addendum)
Advanced Heart Failure Rounding Note  PCP-Cardiologist: No primary care provider on file.   Subjective:    Interpreter used via [employer] started on losartan and spironolactone. Also diuresed with IV lasix + metolazone. Weight down another 6 pounds.    Feeling better today. Denies SOB.   Objective:   Weight Range: 101.1 kg Body mass index is 27.86 kg/m.   Vital Signs:   Temp:  [98 F (36.7 C)-99.5 F (37.5 C)] 99.2 F (37.3 C) (11/06 0510) Pulse Rate:  [72-90] 90 (11/06 0510) Resp:  [18] 18 (11/06 0510) BP: (91-104)/(68-80) 104/80 (11/06 0510) SpO2:  [95 %-99 %] 95 % (11/06 0510) Weight:  [101.1 kg] 101.1 kg (11/06 0510) Last BM Date: 06/22/18  Weight change: Filed Weights   06/21/18 0942 06/22/18 0627 06/23/18 0510  Weight: 103.5 kg 103.5 kg 101.1 kg    Intake/Output:   Intake/Output Summary (Last 24 hours) at 06/23/2018 0833 Last data filed at 06/23/2018 0810 Gross per 24 hour  Intake 680 ml  Output 1350 ml  Net -670 ml      Physical Exam   Interpreter IPAD used.  General:  Well appearing. No resp difficulty HEENT: normal Neck: supple. no JVD. Carotids 2+ bilat; no bruits. No lymphadenopathy or thryomegaly appreciated. Cor: PMI nondisplaced. Irregular rate & rhythm. No rubs or murmurs. +s3  Lungs: clear Abdomen: soft, nontender, nondistended. No hepatosplenomegaly. No bruits or masses. Good bowel sounds. Extremities: no cyanosis, clubbing, rash, edema Neuro: alert & orientedx3, cranial nerves grossly intact. moves all 4 extremities w/o difficulty. Affect pleasant   Telemetry   Afib 60-70s, personally reviewed.   EKG    No new tracings.    Labs    CBC Recent Labs    06/21/18 0832 06/23/18 0400  WBC 5.2 4.0  HGB 12.7* 12.2*  HCT 38.9* 37.8*  MCV 92.4 92.0  PLT 120* 017*   Basic Metabolic Panel Recent Labs    06/21/18 0832 06/22/18 0242 06/23/18 0400  NA 130* 130* 133*  K 3.8 3.5 3.1*  CL 94* 96* 97*  CO2 24 24 29     GLUCOSE 111* 110* 106*  BUN 30* 35* 36*  CREATININE 1.61* 1.57* 1.58*  CALCIUM 8.5* 8.3* 8.5*  MG 2.2  --   --    Liver Function Tests Recent Labs    06/21/18 0832 06/23/18 0400  AST 37 53*  ALT 25 37  ALKPHOS 128* 146*  BILITOT 2.8* 2.5*  PROT 6.5 6.1*  ALBUMIN 3.4* 3.1*   No results for input(s): LIPASE, AMYLASE in the last 72 hours. Cardiac Enzymes No results for input(s): CKTOTAL, CKMB, CKMBINDEX, TROPONINI in the last 72 hours.  BNP: BNP (last 3 results) Recent Labs    06/18/18 1630  BNP >4,500.0*    ProBNP (last 3 results) No results for input(s): PROBNP in the last 8760 hours.   D-Dimer No results for input(s): DDIMER in the last 72 hours. Hemoglobin A1C No results for input(s): HGBA1C in the last 72 hours. Fasting Lipid Panel No results for input(s): CHOL, HDL, LDLCALC, TRIG, CHOLHDL, LDLDIRECT in the last 72 hours. Thyroid Function Tests No results for input(s): TSH, T4TOTAL, T3FREE, THYROIDAB in the last 72 hours.  Invalid input(s): FREET3  Other results:   Imaging    No results found.   Medications:     Scheduled Medications: . atorvastatin  80 mg Oral q1800  . carvedilol  3.125 mg Oral BID WC  . ferrous gluconate  324 mg Oral Q  breakfast  . furosemide  80 mg Intravenous BID  . insulin aspart  0-9 Units Subcutaneous TID WC  . losartan  12.5 mg Oral Daily  . potassium chloride  20 mEq Oral Daily  . potassium chloride  40 mEq Oral Once  . rivaroxaban  20 mg Oral Q supper  . sodium chloride flush  10-40 mL Intracatheter Q12H  . spironolactone  12.5 mg Oral Daily    Infusions:   PRN Medications: acetaminophen **OR** acetaminophen, sodium chloride flush    Patient Profile   [name] is a 72 year old spanish speaking male with a history of  chronic systolic heart failure, NICM,  LBBB,  DMII, chronic A fib, and chronic anticoagulation with xarelto. He does not have medical insurance. He is not a [place] citizen. He requires  assistance with medications.   Admitted with A/C systolic heart failure. Diuresing with IV lasix.   Assessment/Plan   1. A/C Systolic Heart Failure, NICM. LHC 2018 50% LAD.  - Wide QRS 200 ms.  -  Appears euvolemic.  - Stop IV lasix. Start lasix 40 mg twice a day.   - Continue carvedilol 3.125 mg BID - Heart rate in the 60s hold off on digoxin.  -Continue losartan at 12.5 mg qhs. - Increase spiro 25 mg daily.   2. LBBB - QRS 200 ms - EP has seen.  - Plan to see as an outpatient (if compliant) and schedule CRTs able as charity case. -EP has set up appointment for follow up.   3. Chronic A fib - Rate controlled. Continue low dose bb. - No bleeding on xarelto.  4. AKI - Creatinine 2018 was 1.1  - Creatinine 1.2>1.6 >1.57>1.58 5. DMII - SSI per primary.   Plan to fill  losartan and spiro via HF fund. He has all the other medications.    Lasix 40 mg twice a day  Carvedilol 3.125 mg twice a day  Losartan 12.5 mg every evening  Spiro 25 mg daily  Atorvastatin 80 mg daily Xarelto 20 mg daily --he has xarelto at home.  KDur 20 meq daily   HF follow up set up for next week. Plan to check BMEt at that time. Discussed with Dr Starla Link at the bedside.   Medication concerns reviewed with patient and pharmacy team. Barriers identified: Language barrier. Compliance.   Length of Stay: [place], NP  06/23/2018, 8:33 AM  Advanced Heart Failure Team Pager [phone] (M-F; 7a - 4p)  Please contact [facility] for night-coverage after hours (4p -7a ) and weekends on [email or web address]  Patient seen and examined with the above-signed Advanced Practice Provider and/or Housestaff. I personally reviewed laboratory data, imaging studies and relevant notes. I independently examined the patient and formulated the important aspects of the plan. I have edited the note to reflect any of my changes or salient points. I have personally discussed the plan with the patient and/or family.  Diuresed well  overnight. Weight down another 6 pounds. Renal function stable. Tolerating spiro and losartan. K low and being supped. [place] for d/c today with close f/u in HF Clinic and with EP. For CRT-P soon.   HF team will sign off. Meds and f/u as above.   Glori Bickers, MD  8:14 PM

## 2018-06-23 NOTE — Progress Notes (Signed)
Pt thankful to go home. Reviewed ed. He would benefit from a RW but will buy one in the community since no insurance. Encouraged ambulation at home.  [phone] [place], ACSM 10:51 AM 06/23/2018

## 2018-06-23 NOTE — Progress Notes (Addendum)
Patient has follow-up appt in the AHF Clinic on 06/30/18 at 12:00 noon.

## 2018-06-23 NOTE — Discharge Summary (Signed)
Physician Discharge Summary  Bud Kaeser QIH:[id number] DOB: 1945/09/23 DOA: 06/18/2018  PCP: Mack Hook, MD  Admit date: 06/18/2018 Discharge date: 06/23/2018  Admitted From: Home Disposition: Home  Recommendations for Outpatient Follow-up:  1. Follow up with PCP in 1 week with repeat CBC/BMP 2. Follow-up with advanced heart failure team 3. Follow-up in the ED if symptoms worsen or new appear   Home Health: No Equipment/Devices: None  Discharge Condition: Guarded to poor CODE STATUS: Full Diet recommendation: Heart Healthy / Carb Modified /fluid restriction of up to 1200 cc a day  Brief/Interim Summary: 72 year old male with history nonischemic cardiomyopathy with EF of 15 to 20% in 8756, chronic systolic CHF, LBBB, CAD, DM 2, HTN, persistent A. fib on Xarelto, chronic kidney disease stage III and anemia presented with dyspnea on exertion, weight gain and fatigue over several weeks.  He was admitted for acute on chronic CHF and started on intravenous Lasix.  Advanced heart failure team was consulted.  His symptoms have much improved.  He will be discharged on medication regimen as per advanced heart failure team's recommendations.  discharge Diagnoses:  Principal Problem:   Acute on chronic systolic CHF (congestive heart failure) (HCC) Active Problems:   Atrial fibrillation (HCC)   HTN (hypertension)   LBBB (left bundle branch block)   Controlled type 2 diabetes mellitus with stage 3 chronic kidney disease, without long-term current use of insulin (HCC)  Acute on chronic systolic CHF -Echo showed EF of 15% with wall motion abnormalities -Treated with intravenous Lasix along with other oral medications as per advanced heart failure team's recommendations -Negative balance of 5010 cc since admission -Currently stable -Advanced heart failure team has cleared the patient for discharge on Lasix 40 mg twice a day, Coreg 3.125 mg twice a day, losartan 12.5 mg every  evening, spironolactone 25 mg daily, atorvastatin 80 mg daily, K-Dur 20 milliequivalents daily and Xarelto 20 mill grams daily. -Outpatient follow-up with advanced heart failure team with repeat BMP  Left bundle branch block -Outpatient follow-up with cardiology/EP to arrange CRT  Persistent A. fib -Rate controlled.  Continue Coreg and Xarelto  Acute kidney injury on chronic kidney disease stage III -Creatinine 1.58 today.  Outpatient follow-up  Essential hypertension -Blood pressure stable.  Antihypertensive regimen as above  Hypokalemia - improved.  Outpatient follow-up   Diabetes mellitus type 2 -Outpatient follow-up.  Metformin on hold  Thrombocytopenia -Questionable cause.  Stable  Hyperlipidemia -Continue statin  Normocytic anemia -Stable    Discharge Instructions  Discharge Instructions    (HEART FAILURE PATIENTS) Call MD:  Anytime you have any of the following symptoms: 1) 3 pound weight gain in 24 hours or 5 pounds in 1 week 2) shortness of breath, with or without a dry hacking cough 3) swelling in the hands, feet or stomach 4) if you have to sleep on extra pillows at night in order to breathe.   Complete by:  As directed    Call MD for:  difficulty breathing, headache or visual disturbances   Complete by:  As directed    Call MD for:  extreme fatigue   Complete by:  As directed    Call MD for:  hives   Complete by:  As directed    Call MD for:  persistant dizziness or light-headedness   Complete by:  As directed    Call MD for:  persistant nausea and vomiting   Complete by:  As directed    Call MD for:  severe uncontrolled pain  Complete by:  As directed    Call MD for:  temperature >100.4   Complete by:  As directed    Diet - low sodium heart healthy   Complete by:  As directed    Diet Carb Modified   Complete by:  As directed    Increase activity slowly   Complete by:  As directed      Allergies as of 06/23/2018   No Known Allergies      Medication List    STOP taking these medications   metFORMIN 500 MG tablet Commonly known as:  GLUCOPHAGE     TAKE these medications   acetaminophen 650 MG CR tablet Commonly known as:  TYLENOL Take 650 mg by mouth every 8 (eight) hours as needed for pain. Reported on 02/14/2016   AGAMATRIX PRESTO PRO METER Devi Twice daily blood glucose checks before meals   atorvastatin 80 MG tablet Commonly known as:  LIPITOR Take 1 tablet (80 mg total) by mouth every evening.   carvedilol 3.125 MG tablet Commonly known as:  COREG Take 1 tablet (3.125 mg total) by mouth 2 (two) times daily with a meal.   ferrous gluconate 324 MG tablet Commonly known as:  FERGON Take 1 tablet (324 mg total) by mouth daily with breakfast.   furosemide 40 MG tablet Commonly known as:  LASIX Take 1 tablet (40 mg total) by mouth 2 (two) times daily. What changed:    how much to take  how to take this  when to take this  additional instructions   glucose blood test strip Twice daily glucose checks before meals   losartan 25 MG tablet Commonly known as:  COZAAR Take 0.5 tablets (12.5 mg total) by mouth daily.   Menthol-Camphor 3-3 % Gel Apply to knees as needed for pain What changed:    how much to take  how to take this  when to take this  reasons to take this  additional instructions   potassium chloride 10 MEQ tablet Commonly known as:  K-DUR Take 2 tablets (20 mEq total) by mouth daily. What changed:  how much to take   spironolactone 25 MG tablet Commonly known as:  ALDACTONE Take 1 tablet (25 mg total) by mouth daily.   vitamin C 500 MG tablet Commonly known as:  ASCORBIC ACID Take 500 mg by mouth daily.   XARELTO 20 MG Tabs tablet Generic drug:  rivaroxaban TAKE 1 TABLET BY MOUTH ONCE DAILY WITH SUPPER What changed:  See the new instructions.       Follow-up Information    Patsey Berthold, NP Follow up on 07/01/2018.   Specialty:  Cardiology Why:  at  11:40AM Contact information: [place] [address] [phone]         [facility]. Go on 07/05/2018.   Specialty:  Cardiology Why:  at 11:00 AM in the [facility].  Please bring all medications to your appt.  Gate code is 1800 for November. Contact information: [address] [phone] mc [place]       Mack Hook, MD. Schedule an appointment as soon as possible for a visit in 1 week.   Specialty:  Internal Medicine Why:  with repeat CBC/BMP Office will call Contact information: [place] [address] [phone]          No Known Allergies  Consultations:  Advanced heart failure   Procedures/Studies: Dg  Chest 2 View  Result Date: 06/18/2018 CLINICAL DATA:  Chest pain and history of CHF EXAM: CHEST - 2 VIEW COMPARISON:  02/08/2016 FINDINGS: Marked cardiomegaly, unchanged. There is pulmonary vascular congestion without overt edema. No pleural effusion or pneumothorax. No focal consolidation. IMPRESSION: Cardiomegaly without overt pulmonary edema. Electronically Signed   By: Ulyses Jarred M.D.   On: 06/18/2018 16:35   Dg Abdomen 1 View  Result Date: 06/18/2018 CLINICAL DATA:  Constipation. EXAM: ABDOMEN - 1 VIEW COMPARISON:  CT abdomen and pelvis 02/10/2016 FINDINGS: Gas is present in scattered loops of small and large bowel without significant bowel dilatation seen to indicate obstruction. A moderate amount of colonic stool is noted at the level of the splenic flexure. There is also a moderate amount of stool in the rectum. No gross intraperitoneal free air is evident on this supine study. Lumbar spondylosis is noted. IMPRESSION: Moderate amount of left-sided colonic and rectal stool without evidence of obstruction. Electronically Signed   By: Logan Bores M.D.   On: 06/18/2018 17:12   US Abdomen Complete  Result Date:  06/19/2018 CLINICAL DATA:  Abdominal pain for 6 months. EXAM: ABDOMEN ULTRASOUND COMPLETE COMPARISON:  MRI of the abdomen 06/16/2017 FINDINGS: Gallbladder: The gallbladder is mildly contracted. Wall thickness is upper limits of normal to slightly thickened at 3.5 mm. No stones or polyps are present. Common bile duct: Diameter: 4.5 mm, within normal limits. There is a mildly nodular appearance of the liver. No discrete lesions are present. Parenchyma is of normal echotexture. Liver: No focal lesion identified. Within normal limits in parenchymal echogenicity. Portal vein is patent on color Doppler imaging with normal direction of blood flow towards the liver. IVC: No abnormality visualized. Pancreas: The pancreas is not visualized due to overlying bowel gas. Spleen: Size and appearance within normal limits. Right Kidney: Length: 11.2 cm, within normal limits. Echogenicity within normal limits. No mass or hydronephrosis visualized. Left Kidney: Length: 12.4 cm, within normal limits. Echogenicity within normal limits. No mass or hydronephrosis visualized. Abdominal aorta: No aneurysm visualized. The midportion of the aorta is poorly visualized due to overlying bowel gas. Other findings: Moderate abdominal ascites are again noted. IMPRESSION: 1. Moderate abdominal ascites. 2. Somewhat nodular appearance of the liver.  Question cirrhosis. 3. Gallbladder wall thickening is upper limits of normal. This is likely exaggerated due to contracted state of the gallbladder. Electronically Signed   By: San Morelle M.D.   On: 06/19/2018 09:40   Dg Chest Port 1 View  Result Date: 06/20/2018 CLINICAL DATA:  Check PICC line placement EXAM: PORTABLE CHEST 1 VIEW COMPARISON:  06/18/2018 FINDINGS: PICC line is now seen with the tip at the cavoatrial junction. Cardiac shadow remains enlarged. The lungs are well aerated with minimal platelike atelectasis in the left base. No other focal abnormality is noted. IMPRESSION: Status  post PICC line in satisfactory position. Electronically Signed   By: Inez Catalina M.D.   On: 06/20/2018 17:06   [place] Ekg Site Rite  Result Date: 06/20/2018 If Site Rite image not attached, placement could not be confirmed due to current cardiac rhythm.   Echo on 06/20/2018 Study Conclusions  - Left ventricle: The cavity size was moderately dilated. Wall   thickness was normal. The estimated ejection fraction was 15%.   Severe global hypokinesis with inferior akinesis. Septal   dyskinesis. The study is not technically sufficient to allow   evaluation of LV diastolic function. - Aortic valve: Trileaflet; mildly thickened leaflets. There was   trivial regurgitation. -  Mitral valve: Mildly thickened leaflets . There was moderate   regurgitation. - Left atrium: Severely dilated. - Right ventricle: The cavity size was mildly dilated. Mildly   reduced systolic function. - Atrial septum: A patent foramen ovale cannot be excluded. - Tricuspid valve: There was moderate regurgitation. - Pulmonary arteries: PA peak pressure: 65 mm Hg (S). - Inferior vena cava: The vessel was dilated. The respirophasic   diameter changes were blunted (< 50%), consistent with elevated   central venous pressure.  Impressions:  - Compared to a prior echo in 2017, the LVEF is further decreased   to 15% with severe global hypokinesis, inferior akinesis and   septal dyskinesis.    Subjective: Patient seen and examined at bedside.  He denies any worsening chest pain, shortness of breath, nausea or vomiting.  Discharge Exam: Vitals:   06/22/18 1929 06/23/18 0510  BP: 91/68 104/80  Pulse: 72 90  Resp: 18 18  Temp: 98 F (36.7 C) 99.2 F (37.3 C)  SpO2: 99% 95%   Vitals:   06/22/18 0627 06/22/18 1213 06/22/18 1929 06/23/18 0510  BP: 116/83 102/73 91/68 104/80  Pulse: 92 81 72 90  Resp: 18  18 18   Temp: 100.3 F (37.9 C) 99.5 F (37.5 C) 98 F (36.7 C) 99.2 F (37.3 C)  TempSrc: Oral Oral Oral  Oral  SpO2: 99% 98% 99% 95%  Weight: 103.5 kg   101.1 kg  Height:        General: Pt is alert, awake, not in acute distress Cardiovascular: rate controlled, S1/S2 + Respiratory: bilateral decreased breath sounds at bases with some scattered crackles Abdominal: Soft, NT, ND, bowel sounds + Extremities: Trace edema, no cyanosis    The results of significant diagnostics from this hospitalization (including imaging, microbiology, ancillary and laboratory) are listed below for reference.     Microbiology: No results found for this or any previous visit (from the past 240 hour(s)).   Labs: BNP (last 3 results) Recent Labs    06/18/18 1630  BNP >3,086.5*   Basic Metabolic Panel: Recent Labs  Lab 06/18/18 1629 06/18/18 2326 06/19/18 0614 06/21/18 0832 06/22/18 0242 06/23/18 0400  NA 132*  --  135 130* 130* 133*  K 3.9  --  3.2* 3.8 3.5 3.1*  CL 97*  --  100 94* 96* 97*  CO2 28  --  24 24 24 29   GLUCOSE 169*  --  92 111* 110* 106*  BUN 29*  --  25* 30* 35* 36*  CREATININE 1.45*  --  1.20 1.61* 1.57* 1.58*  CALCIUM 9.0  --  8.5* 8.5* 8.3* 8.5*  MG  --  2.0  --  2.2  --   --    Liver Function Tests: Recent Labs  Lab 06/18/18 2326 06/21/18 0832 06/23/18 0400  AST 27 37 53*  ALT 16 25 37  ALKPHOS 146* 128* 146*  BILITOT 2.3* 2.8* 2.5*  PROT 7.0 6.5 6.1*  ALBUMIN 3.5 3.4* 3.1*   No results for input(s): LIPASE, AMYLASE in the last 168 hours. No results for input(s): AMMONIA in the last 168 hours. CBC: Recent Labs  Lab 06/18/18 1629 06/19/18 0614 06/21/18 0832 06/23/18 0400  WBC 6.6 6.4 5.2 4.0  HGB 13.1 11.7* 12.7* 12.2*  HCT 40.4 36.5* 38.9* 37.8*  MCV 94.2 91.7 92.4 92.0  PLT 156 145* 120* 115*   Cardiac Enzymes: Recent Labs  Lab 06/18/18 2326 06/19/18 0614 06/19/18 1212  TROPONINI 0.04* 0.04* 0.04*   BNP: Invalid  input(s): POCBNP CBG: Recent Labs  Lab 06/22/18 0729 06/22/18 1211 06/22/18 1729 06/22/18 2104 06/23/18 0810  GLUCAP 113*  124* 131* 143* 108*   D-Dimer No results for input(s): DDIMER in the last 72 hours. Hgb A1c No results for input(s): HGBA1C in the last 72 hours. Lipid Profile No results for input(s): CHOL, HDL, LDLCALC, TRIG, CHOLHDL, LDLDIRECT in the last 72 hours. Thyroid function studies No results for input(s): TSH, T4TOTAL, T3FREE, THYROIDAB in the last 72 hours.  Invalid input(s): FREET3 Anemia work up No results for input(s): VITAMINB12, FOLATE, FERRITIN, TIBC, IRON, RETICCTPCT in the last 72 hours. Urinalysis No results found for: COLORURINE, APPEARANCEUR, LABSPEC, [place], GLUCOSEU, HGBUR, BILIRUBINUR, KETONESUR, PROTEINUR, UROBILINOGEN, NITRITE, LEUKOCYTESUR Sepsis Labs Invalid input(s): PROCALCITONIN,  WBC,  LACTICIDVEN Microbiology No results found for this or any previous visit (from the past 240 hour(s)).   Time coordinating discharge: 35 minutes  SIGNED:   Aline August, MD  [facility] 06/23/2018, 10:52 AM Pager: [phone]  If 7PM-7AM, please contact night-coverage [email or web address] Password [email or web address]

## 2018-06-23 NOTE — Progress Notes (Signed)
The following medications have been sent to the [facility] to be filled through the [facility].   Losartan 12.5 mg daily Spironolactone 25 mg daily  I have collaborated with inpatient pharmacy team to deliver medications to patient's bedside prior to discharge.

## 2018-06-24 ENCOUNTER — Ambulatory Visit: Payer: Self-pay | Admitting: Internal Medicine

## 2018-06-24 ENCOUNTER — Encounter: Payer: Self-pay | Admitting: Internal Medicine

## 2018-06-24 VITALS — BP 100/68 | HR 72 | Resp 12 | Ht 71.0 in | Wt 221.0 lb

## 2018-06-24 DIAGNOSIS — N183 Chronic kidney disease, stage 3 unspecified: Secondary | ICD-10-CM

## 2018-06-24 DIAGNOSIS — I5022 Chronic systolic (congestive) heart failure: Secondary | ICD-10-CM

## 2018-06-24 DIAGNOSIS — K4090 Unilateral inguinal hernia, without obstruction or gangrene, not specified as recurrent: Secondary | ICD-10-CM

## 2018-06-24 DIAGNOSIS — E1122 Type 2 diabetes mellitus with diabetic chronic kidney disease: Secondary | ICD-10-CM

## 2018-06-24 DIAGNOSIS — D649 Anemia, unspecified: Secondary | ICD-10-CM

## 2018-06-24 DIAGNOSIS — I482 Chronic atrial fibrillation, unspecified: Secondary | ICD-10-CM

## 2018-06-24 MED ORDER — POTASSIUM CHLORIDE ER 10 MEQ PO TBCR: EXTENDED_RELEASE_TABLET | ORAL | 11 refills | Status: DC

## 2018-06-24 MED ORDER — GLIPIZIDE 5 MG PO TABS: ORAL_TABLET | ORAL | 11 refills | Status: DC

## 2018-06-24 MED ORDER — SPIRONOLACTONE 25 MG PO TABS: 25.0000 mg | ORAL_TABLET | Freq: Every day | ORAL | 11 refills | Status: DC

## 2018-06-24 MED ORDER — ATORVASTATIN CALCIUM 80 MG PO TABS: 80.0000 mg | ORAL_TABLET | Freq: Every evening | ORAL | 11 refills | Status: DC

## 2018-06-24 MED ORDER — FUROSEMIDE 40 MG PO TABS: 40.0000 mg | ORAL_TABLET | Freq: Two times a day (BID) | ORAL | 11 refills | Status: DC

## 2018-06-24 MED ORDER — LOSARTAN POTASSIUM 25 MG PO TABS: 12.5000 mg | ORAL_TABLET | Freq: Every day | ORAL | 11 refills | Status: DC

## 2018-06-24 NOTE — Patient Instructions (Addendum)
Azucar de [employer] al dia antes de comida. habla clinica si azucar abajo

## 2018-06-24 NOTE — Progress Notes (Signed)
Subjective:    Patient ID: Kyle Burke, male    DOB: Jun 04, 1946, 72 y.o.   MRN: [id number]  HPI   Follow up for decompensated CHF/nonishemic Cardiomyopathy from 11/1 to 11/6.  EF now down to 15% on Echo with this hospitalization. Patient always with fatigue, but dyspnea and fatigue much worse starting 06/17/18.  He did have increased LE and abdominal girth swelling with this.  Was found to have pulmonary edema on CXR as well.    1.  DM:  Metformin discontinued in hospital due to acute on chronic renal failure.  He took Metformin today for first time.  He is not following the discharge medication list from hospitalization. Sugar was 111 this morning before breakfast.  States that is generally what his sugar has been running before meals, but has only been home 1 full day.  Was on insulin ss in hospital.    2.  Non ischemic cardiomyopathy with worsening EF, chronic Afib rate controlled on Xarelto.  Diuresed 5010 ml and had med changes in hospital with significant improvement in breathing.   Has gone back and forth between Dr Acie Fredrickson and Cardiology at [facility] due to difficulties with financial assistance.  He has been waiting most recently to obtain financial assistance with [facility] for placement implantable pacemaker/defibrillator, but that has not come through.  He is now scheduled back with cardiology with [facility] and has financial assistance with [facility] through February.  Has appt Nov 18 with [facility] to assess for this. Now taking ARB, spironolactone, continues on Coreg, Xarelto, Atorvastatin, KDur, Lasix.  Needs refill of potassium.  No chest pain.  Breathing improving daily little by little.  Still quite fatigued, but also improving gradually.  Abdominal and leg swelling better than when admitted to hospital.  No palpitations.  BP fine.  3.  Left inguinal hernia:  Has appt. On NOv 13th with surgery, Dr. Greer Pickerel.  Discussed he is not stable to consider surgery  at this time.  He understands. Discussed concerning signs or symptoms of hernia that would lead to emergent evaluation.  Current Meds  Medication Sig  . acetaminophen (TYLENOL) 650 MG CR tablet Take 650 mg by mouth every 8 (eight) hours as needed for pain. Reported on 02/14/2016  . atorvastatin (LIPITOR) 80 MG tablet Take 1 tablet (80 mg total) by mouth every evening.  . Blood Glucose Monitoring Suppl (AGAMATRIX PRESTO PRO METER) DEVI Twice daily blood glucose checks before meals  . carvedilol (COREG) 3.125 MG tablet Take 1 tablet (3.125 mg total) by mouth 2 (two) times daily with a meal.  . ferrous gluconate (FERGON) 324 MG tablet Take 1 tablet (324 mg total) by mouth daily with breakfast.  . furosemide (LASIX) 40 MG tablet Take 1 tablet (40 mg total) by mouth 2 (two) times daily.  Marland Kitchen glucose blood (AGAMATRIX PRESTO TEST) test strip Twice daily glucose checks before meals  . losartan (COZAAR) 25 MG tablet Take 0.5 tablets (12.5 mg total) by mouth daily.  . Menthol-Camphor 3-3 % GEL Apply to knees as needed for pain (Patient taking differently: Apply 1 application topically daily as needed (knee pain). )  . spironolactone (ALDACTONE) 25 MG tablet Take 1 tablet (25 mg total) by mouth daily.  Alveda Reasons 20 MG TABS tablet TAKE 1 TABLET BY MOUTH ONCE DAILY WITH SUPPER (Patient taking differently: Take 20 mg by mouth daily with supper. )    No Known Allergies      Review of Systems  Objective:   Physical Exam NAD HEENT:  Mild scleral icterus, MMM Neck:  Supple, No adenopathy Chest: CTA save for occasional crackle in RLL posteriorly Resonant to percussion over same area. CV:  Irreg irreg.  Radial and DP pulses normal and equal Abd:  No fluid wave.  No HSM or mass, + BS.  Mass in left groin not reducible.  Does appear to transilluminate somewhat.  NT LE:  No edema.       Assessment & Plan:  1.  DM:  Discuss should not restart Metformin with his delicate balance with poor CO and CKD  with recurrent decompensation. Low dose Glipizide and keep close watch on blood sugars.  May need to stop medication all together at some point.  2.   Cardiomyopathy/afib with recent hospitalization for decompensation.  Now on low dose ARB and spironolactone.  Will need to keep close eye on potassium and renal function.   Addendum:  Wife returned yesterday--cost for some of meds at [facility] was steep--switching meds back to [facility].  BMP today  3.  Mild anemia:  Cbc.  4.  Left inguinal hernia vs hydrocele, feel more likely the former:  Discussed he is not a good candidate for surgery at this point, but concerned this may become a surgical emergency if not addressed sooner rather than later.

## 2018-06-25 LAB — CBC WITH DIFFERENTIAL/PLATELET
BASOS: 1 %
Basophils Absolute: 0 10*3/uL (ref 0.0–0.2)
EOS (ABSOLUTE): 0 10*3/uL (ref 0.0–0.4)
EOS: 1 %
HEMATOCRIT: 39.4 % (ref 37.5–51.0)
Hemoglobin: 13.3 g/dL (ref 13.0–17.7)
Immature Grans (Abs): 0 10*3/uL (ref 0.0–0.1)
Immature Granulocytes: 1 %
Lymphocytes Absolute: 0.6 10*3/uL — ABNORMAL LOW (ref 0.7–3.1)
Lymphs: 14 %
MCH: 30.6 pg (ref 26.6–33.0)
MCHC: 33.8 g/dL (ref 31.5–35.7)
MCV: 91 fL (ref 79–97)
MONOS ABS: 0.5 10*3/uL (ref 0.1–0.9)
Monocytes: 12 %
NEUTROS ABS: 3.1 10*3/uL (ref 1.4–7.0)
Neutrophils: 71 %
PLATELETS: 145 10*3/uL — AB (ref 150–450)
RBC: 4.35 x10E6/uL (ref 4.14–5.80)
RDW: 14.1 % (ref 12.3–15.4)
WBC: 4.3 10*3/uL (ref 3.4–10.8)

## 2018-06-25 LAB — BASIC METABOLIC PANEL
BUN / CREAT RATIO: 24 (ref 10–24)
BUN: 36 mg/dL — AB (ref 8–27)
CHLORIDE: 89 mmol/L — AB (ref 96–106)
CO2: 27 mmol/L (ref 20–29)
Calcium: 8.7 mg/dL (ref 8.6–10.2)
Creatinine, Ser: 1.47 mg/dL — ABNORMAL HIGH (ref 0.76–1.27)
GFR, EST AFRICAN AMERICAN: 55 mL/min/{1.73_m2} — AB (ref >59)
GFR, EST NON AFRICAN AMERICAN: 47 mL/min/{1.73_m2} — AB (ref >59)
Glucose: 135 mg/dL — ABNORMAL HIGH (ref 65–99)
POTASSIUM: 3.4 mmol/L — AB (ref 3.5–5.2)
Sodium: 131 mmol/L — ABNORMAL LOW (ref 134–144)

## 2018-06-25 MED ORDER — GLIPIZIDE 5 MG PO TABS: ORAL_TABLET | ORAL | 11 refills | Status: DC

## 2018-06-25 MED ORDER — SPIRONOLACTONE 25 MG PO TABS: 25.0000 mg | ORAL_TABLET | Freq: Every day | ORAL | 11 refills | Status: DC

## 2018-06-25 MED ORDER — CARVEDILOL 3.125 MG PO TABS: 3.1250 mg | ORAL_TABLET | Freq: Two times a day (BID) | ORAL | 11 refills | Status: DC

## 2018-06-25 MED ORDER — ATORVASTATIN CALCIUM 80 MG PO TABS: 80.0000 mg | ORAL_TABLET | Freq: Every evening | ORAL | 11 refills | Status: DC

## 2018-06-25 MED ORDER — POTASSIUM CHLORIDE ER 10 MEQ PO TBCR: EXTENDED_RELEASE_TABLET | ORAL | 11 refills | Status: DC

## 2018-06-25 MED ORDER — LOSARTAN POTASSIUM 25 MG PO TABS: 12.5000 mg | ORAL_TABLET | Freq: Every day | ORAL | 11 refills | Status: DC

## 2018-06-25 MED ORDER — FUROSEMIDE 40 MG PO TABS: 40.0000 mg | ORAL_TABLET | Freq: Two times a day (BID) | ORAL | 11 refills | Status: DC

## 2018-06-30 ENCOUNTER — Ambulatory Visit (HOSPITAL_COMMUNITY)
Admission: RE | Admit: 2018-06-30 | Discharge: 2018-06-30 | Disposition: A | Discharge status: Home / Self Care | Payer: Self-pay | Source: Ambulatory Visit | Attending: Internal Medicine | Admitting: Internal Medicine

## 2018-06-30 ENCOUNTER — Encounter (HOSPITAL_COMMUNITY): Payer: Self-pay

## 2018-06-30 VITALS — BP 108/60 | HR 61 | Wt 215.2 lb

## 2018-06-30 DIAGNOSIS — I130 Hypertensive heart and chronic kidney disease with heart failure and stage 1 through stage 4 chronic kidney disease, or unspecified chronic kidney disease: Secondary | ICD-10-CM | POA: Insufficient documentation

## 2018-06-30 DIAGNOSIS — I2510 Atherosclerotic heart disease of native coronary artery without angina pectoris: Secondary | ICD-10-CM | POA: Insufficient documentation

## 2018-06-30 DIAGNOSIS — I428 Other cardiomyopathies: Secondary | ICD-10-CM | POA: Insufficient documentation

## 2018-06-30 DIAGNOSIS — N183 Chronic kidney disease, stage 3 unspecified: Secondary | ICD-10-CM

## 2018-06-30 DIAGNOSIS — I447 Left bundle-branch block, unspecified: Secondary | ICD-10-CM | POA: Insufficient documentation

## 2018-06-30 DIAGNOSIS — I482 Chronic atrial fibrillation, unspecified: Secondary | ICD-10-CM | POA: Insufficient documentation

## 2018-06-30 DIAGNOSIS — I5022 Chronic systolic (congestive) heart failure: Secondary | ICD-10-CM | POA: Insufficient documentation

## 2018-06-30 DIAGNOSIS — Z833 Family history of diabetes mellitus: Secondary | ICD-10-CM | POA: Insufficient documentation

## 2018-06-30 DIAGNOSIS — Z7901 Long term (current) use of anticoagulants: Secondary | ICD-10-CM | POA: Insufficient documentation

## 2018-06-30 DIAGNOSIS — E1122 Type 2 diabetes mellitus with diabetic chronic kidney disease: Secondary | ICD-10-CM | POA: Insufficient documentation

## 2018-06-30 DIAGNOSIS — Z8249 Family history of ischemic heart disease and other diseases of the circulatory system: Secondary | ICD-10-CM | POA: Insufficient documentation

## 2018-06-30 DIAGNOSIS — Z79899 Other long term (current) drug therapy: Secondary | ICD-10-CM | POA: Insufficient documentation

## 2018-06-30 LAB — BASIC METABOLIC PANEL
ANION GAP: 9 (ref 5–15)
BUN: 23 mg/dL (ref 8–23)
CALCIUM: 9.1 mg/dL (ref 8.9–10.3)
CO2: 27 mmol/L (ref 22–32)
Chloride: 101 mmol/L (ref 98–111)
Creatinine, Ser: 1.23 mg/dL (ref 0.61–1.24)
GFR calc non Af Amer: 57 mL/min — ABNORMAL LOW (ref >60)
GLUCOSE: 156 mg/dL — AB (ref 70–99)
Potassium: 3.8 mmol/L (ref 3.5–5.1)
Sodium: 137 mmol/L (ref 135–145)

## 2018-06-30 NOTE — Progress Notes (Signed)
Electrophysiology Office Note Date: 07/04/2018  ID:  Kyle, Burke 12-Jan-1946, MRN [id number]  PCP: Mack Hook, MD Primary Cardiologist: [place] Electrophysiologist: Lovena Le  CC: to discuss CRTP implant  Kyle Burke is a 72 y.o. male seen today for Dr Lovena Le. He is seen with his family and Spanish interpretor.  Since discharge, he reports doing fairly well.  Shortness of breath improved from admission.  He has also been seen by general surgery and is being considered for open hernia repair.  He denies chest pain, palpitations, dyspnea (above baseline), PND, orthopnea, nausea, vomiting, dizziness, syncope, edema, weight gain, or early satiety.  Past Medical History:  Diagnosis Date  . Atrial fibrillation ([place]) 2016  . CAD (coronary artery disease)   . Diabetes mellitus 2001  . Hypertension 2006  . Idiopathic cardiomyopathy ([place]) 11/01/2014   Followed by Dr. Acie Fredrickson, Cardiology  EF 25%  Adenosine Cardiolite did not support ischemic etiology  . OA (osteoarthritis) of knee 01/08/2017  . Pleural effusion    Past Surgical History:  Procedure Laterality Date  . COLONOSCOPY WITH PROPOFOL N/A 05/14/2016   Procedure: COLONOSCOPY WITH PROPOFOL;  Surgeon: Jerene Bears, MD;  Location: [facility];  Service: Gastroenterology;  Laterality: N/A;  . ESOPHAGOGASTRODUODENOSCOPY (EGD) WITH PROPOFOL N/A 05/14/2016   Procedure: ESOPHAGOGASTRODUODENOSCOPY (EGD) WITH PROPOFOL;  Surgeon: Jerene Bears, MD;  Location: [facility];  Service: Gastroenterology;  Laterality: N/A;  . HERNIA REPAIR    . INCISE AND DRAIN ABCESS  1986   on leg  . INGUINAL HERNIA REPAIR  01/16/2012   Procedure: LAPAROSCOPIC INGUINAL HERNIA;  Surgeon: Gayland Curry, MD,FACS;  Location: [facility];  Service: General;  Laterality: Left;  Laparoscopic incarcerated Left inguinal hernia repair with mesh, umbilical hernia repair  . UMBILICAL HERNIA REPAIR  01/16/2012   Procedure: HERNIA REPAIR UMBILICAL ADULT;   Surgeon: Gayland Curry, MD,FACS;  Location: [facility];  Service: General;  Laterality: N/A;    Current Outpatient Medications  Medication Sig Dispense Refill  . acetaminophen (TYLENOL) 650 MG CR tablet Take 650 mg by mouth every 8 (eight) hours as needed for pain. Reported on 02/14/2016    . atorvastatin (LIPITOR) 80 MG tablet Take 1 tablet by mouth daily.    . Blood Glucose Monitoring Suppl (AGAMATRIX PRESTO PRO METER) DEVI Twice daily blood glucose checks before meals 1 Device 0  . carvedilol (COREG) 3.125 MG tablet Take 1 tablet (3.125 mg total) by mouth 2 (two) times daily with a meal. 60 tablet 11  . ferrous gluconate (FERGON) 324 MG tablet Take 1 tablet (324 mg total) by mouth daily with breakfast.  3  . furosemide (LASIX) 40 MG tablet Take 1 tablet (40 mg total) by mouth 2 (two) times daily. 60 tablet 11  . glipiZIDE (GLUCOTROL) 5 MG tablet Take by mouth daily before breakfast.    . glucose blood (AGAMATRIX PRESTO TEST) test strip Twice daily glucose checks before meals 100 each 12  . losartan (COZAAR) 25 MG tablet Take 0.5 tablets (12.5 mg total) by mouth daily. 30 tablet 11  . spironolactone (ALDACTONE) 25 MG tablet Take 1 tablet (25 mg total) by mouth daily. 30 tablet 11  . XARELTO 20 MG TABS tablet TAKE 1 TABLET BY MOUTH ONCE DAILY WITH SUPPER 30 tablet 11   No current facility-administered medications for this visit.     Allergies:   Patient has no known allergies.   Social History: Social History   Socioeconomic History  . Marital status:  Married    Spouse name: Not on file  . Number of children: 5  . Years of education: 9  . Highest education level: Not on file  Occupational History  . Occupation: Ambulance person constrution    Comment: Barista  Social Needs  . Financial resource strain: Not on file  . Food insecurity:    Worry: Not on file    Inability: Not on file  . Transportation needs:    Medical: Not on file    Non-medical: Not on file  Tobacco Use  .  Smoking status: Never Smoker  . Smokeless tobacco: Never Used  Substance and Sexual Activity  . Alcohol use: No    Alcohol/week: 0.0 standard drinks    Comment: rare occ., formerly more  . Drug use: No  . Sexual activity: Yes    Comment: But not regular  Lifestyle  . Physical activity:    Days per week: Not on file    Minutes per session: Not on file  . Stress: Not on file  Relationships  . Social connections:    Talks on phone: Not on file    Gets together: Not on file    Attends religious service: Not on file    Active member of club or organization: Not on file    Attends meetings of clubs or organizations: Not on file    Relationship status: Not on file  . Intimate partner violence:    Fear of current or ex partner: Not on file    Emotionally abused: Not on file    Physically abused: Not on file    Forced sexual activity: Not on file  Other Topics Concern  . Not on file  Social History Narrative   Originally from [place]   Came to [employer]. In 1995   Lives at home with wife, a son and his wife and 3 grandchildren.    Family History: Family History  Problem Relation Age of Onset  . Stroke Mother   . Heart disease Mother   . Heart disease Father   . Diabetes Father   . Kidney disease Father        , history of stones as well  . Diabetes Brother   . Cancer Sister 59       unknown soft tissue cancer of leg    Review of Systems: All other systems reviewed and are otherwise negative except as noted above.   Physical Exam: VS:  BP 104/66   Pulse 67   Ht 5\' 11"  (1.803 m)   Wt 213 lb 6.4 oz (96.8 kg)   SpO2 97%   BMI 29.76 kg/m  , BMI Body mass index is 29.76 kg/m. Wt Readings from Last 3 Encounters:  07/01/18 213 lb 6.4 oz (96.8 kg)  06/30/18 215 lb 3.2 oz (97.6 kg)  06/24/18 221 lb (100.2 kg)    GEN- The patient is well appearing, alert and oriented x 3 today.   HEENT: normocephalic, atraumatic; sclera clear, conjunctiva pink; hearing intact; oropharynx  clear; neck supple  Lungs- Clear to ausculation bilaterally, normal work of breathing.  No wheezes, rales, rhonchi Heart- Irregular rate and rhythm  GI- soft, non-tender, non-distended, bowel sounds present  Extremities- no clubbing, cyanosis, or edema; DP/PT/radial pulses 2+ bilaterally MS- no significant deformity or atrophy Skin- warm and dry, no rash or lesion  Psych- euthymic mood, full affect Neuro- strength and sensation are intact   EKG:  EKG is not ordered today.  Recent Labs: 06/18/2018:  B Natriuretic Peptide >4,500.0; TSH 2.355 06/21/2018: Magnesium 2.2 06/23/2018: ALT 37 07/01/2018: BUN 24; Creatinine, Ser 1.26; Hemoglobin 13.0; Platelets 219; Potassium 4.3; Sodium 139    Other studies Reviewed: Additional studies/ records that were reviewed today include: hospital records    Assessment and Plan:  1.  NICM/CHF/IVCD The patient has NICM despite guideline directed therapy.  He does not have classic LBBB but his QRS is very wide with IVCD and there is potential he would benefit from CRT.  With poor long term prognosis, would not pursue ICD implant (discussed with Dr Haroldine Laws during last admission). Risks, benefits to CRTP implant reviewed with patient and family who wish to proceed. Will schedule with Dr Lovena Le at the next available time. Hold Xarelto for 1 day prior to implant.   2.  Permanent atrial fibrillation LA size is 75 Continue Xarelto for CHADS2VASC of at least 4  3.  Surgical clearance He will need consideration of open repair of hernia Would wait until after CRTP and see how his HF status does before making a decision about surgery   Current medicines are reviewed at length with the patient today.   The patient does not have concerns regarding his medicines.  The following changes were made today:  none  Labs/ tests ordered today include: pre-procedure labs Orders Placed This Encounter  Procedures  . CBC  . Basic metabolic panel     Disposition:    Follow up with Dr Lovena Le after CRTP implant      Signed, Chanetta Marshall, NP 07/04/2018 7:20 AM   [place] [address] [place] [address] [phone] (office) [phone] (fax)

## 2018-06-30 NOTE — Progress Notes (Signed)
PCP: Dr Amil Amen  Cardiologist: Dr Philbert Riser Primary HF Cardiologist: Dr Vaughan Browner  Spanish Speaking  HPI: [name] is a 72 year old [place] speaking male with a history of  chronic systolic heart failure, NICM,  LBBB,  DMII, chroinc A fib, and chronic anticoagulation with xarelto. He does not have medical insurance. He is not a [place] citizen. He requires assistance with medications.  He has been followed by Dr Philbert Riser at [facility]. He was last seen 01/2018. He was stable at that time. Weight was 220 pounds.  Admitted with A/C systolic heart failure. Diuresed with IV lasix and once diuresed transitioned to lasix 40 mg twice a day. EP consulted for LBBB. He is being considered for charitable CRT-D. Discharge weight was 223 pounds.   Today he returns for post hospital follow up. Overall feeling fine. Denies SOB/PND/Orthopnea. He has not been doing much at home but wants to starts doing more at home.  Appetite ok. No fever or chills. Weight at home has been stable. Taking all medications.   Cardiac Testing  Echo 06/2018  Compared to a prior echo in 2017, the LVEF is further decreased to 15% with severe global hypokinesis, inferior akinesis and septal dyskinesis.RV mildly reduced.   ECHO 2017 [place] EF 20-25% RV mildly dilated. Severe hypokinesis.  RHC/ LHC [facility] 2018 at [facility] LAD 50%,mild om2.  RA 12 PCWP 31    ROS: All systems negative except as listed in HPI, PMH and Problem List.  SH:  Social History   Socioeconomic History  . Marital status: Married    Spouse name: Not on file  . Number of children: 5  . Years of education: 9  . Highest education level: Not on file  Occupational History  . Occupation: Ambulance person constrution    Comment: Barista  Social Needs  . Financial resource strain: Not on file  . Food insecurity:    Worry: Not on file    Inability: Not on file  . Transportation needs:    Medical: Not on file    Non-medical: Not on file  Tobacco  Use  . Smoking status: Never Smoker  . Smokeless tobacco: Never Used  Substance and Sexual Activity  . Alcohol use: No    Alcohol/week: 0.0 standard drinks    Comment: rare occ., formerly more  . Drug use: No  . Sexual activity: Yes    Comment: But not regular  Lifestyle  . Physical activity:    Days per week: Not on file    Minutes per session: Not on file  . Stress: Not on file  Relationships  . Social connections:    Talks on phone: Not on file    Gets together: Not on file    Attends religious service: Not on file    Active member of club or organization: Not on file    Attends meetings of clubs or organizations: Not on file    Relationship status: Not on file  . Intimate partner violence:    Fear of current or ex partner: Not on file    Emotionally abused: Not on file    Physically abused: Not on file    Forced sexual activity: Not on file  Other Topics Concern  . Not on file  Social History Narrative   Originally from [place]   Came to [employer]. In 1995   Lives at home with wife, a son and his wife and 3 grandchildren.    FH:  Family History  Problem Relation  Age of Onset  . Stroke Mother   . Heart disease Mother   . Heart disease Father   . Diabetes Father   . Kidney disease Father        , history of stones as well  . Diabetes Brother   . Cancer Sister 28       unknown soft tissue cancer of leg    Past Medical History:  Diagnosis Date  . Atrial fibrillation ([place]) 2016  . CAD (coronary artery disease)   . Diabetes mellitus 2001  . Hypertension 2006  . Idiopathic cardiomyopathy ([place]) 11/01/2014   Followed by Dr. Acie Fredrickson, Cardiology  EF 25%  Adenosine Cardiolite did not support ischemic etiology  . OA (osteoarthritis) of knee 01/08/2017  . Pleural effusion     Current Outpatient Medications  Medication Sig Dispense Refill  . acetaminophen (TYLENOL) 650 MG CR tablet Take 650 mg by mouth every 8 (eight) hours as needed for pain. Reported on 02/14/2016    .  Blood Glucose Monitoring Suppl (AGAMATRIX PRESTO PRO METER) DEVI Twice daily blood glucose checks before meals 1 Device 0  . carvedilol (COREG) 3.125 MG tablet Take 1 tablet (3.125 mg total) by mouth 2 (two) times daily with a meal. 60 tablet 11  . ferrous gluconate (FERGON) 324 MG tablet Take 1 tablet (324 mg total) by mouth daily with breakfast.  3  . furosemide (LASIX) 40 MG tablet Take 1 tablet (40 mg total) by mouth 2 (two) times daily. 60 tablet 11  . glucose blood (AGAMATRIX PRESTO TEST) test strip Twice daily glucose checks before meals 100 each 12  . losartan (COZAAR) 25 MG tablet Take 0.5 tablets (12.5 mg total) by mouth daily. 30 tablet 11  . Menthol-Camphor 3-3 % GEL Apply to knees as needed for pain (Patient taking differently: Apply 1 application topically daily as needed (knee pain). )  0  . potassium chloride (K-DUR) 10 MEQ tablet 2 tabs by mouth once daily. 60 tablet 11  . spironolactone (ALDACTONE) 25 MG tablet Take 1 tablet (25 mg total) by mouth daily. 30 tablet 11  . XARELTO 20 MG TABS tablet TAKE 1 TABLET BY MOUTH ONCE DAILY WITH SUPPER 30 tablet 11   No current facility-administered medications for this encounter.     Vitals:   06/30/18 1204  BP: 108/60  Pulse: 61  SpO2: 98%  Weight: 97.6 kg (215 lb 3.2 oz)   Wt Readings from Last 3 Encounters:  06/30/18 97.6 kg (215 lb 3.2 oz)  06/24/18 100.2 kg (221 lb)  06/23/18 101.1 kg (222 lb 14.4 oz)     PHYSICAL EXAM: Spanish Interpreter present.  General:  Well appearing. No resp difficulty. Walked in the clinic without difficulty  HEENT: normal Neck: supple. JVP flat. Carotids 2+ bilaterally; no bruits. No lymphadenopathy or thryomegaly appreciated. Cor: PMI normal. Irregular  rate & rhythm. No rubs, gallops or murmurs. Lungs: clear Abdomen: soft, nontender, nondistended. No hepatosplenomegaly. No bruits or masses. Good bowel sounds. Extremities: no cyanosis, clubbing, rash, edema Neuro: alert & orientedx3,  cranial nerves grossly intact. Moves all 4 extremities w/o difficulty. Affect pleasant.   ASSESSMENT & PLAN: 1. Chronic Systolic Heart Failure, NICM. LHC 2018 50% LAD ECHO 06/2018 EF 15%  LA severely dilated. EF has been down for years/  NYHA II.I have asked him to start walking more. - Volume status stable. Continue lasix 40 mg twice a day.  -Continue low dose carvedilol 3.125 mg twice a day. Unable to increase with heart  rate 61.  -Continue 12.5 mg losartan daily -Continue 25 mg spironolactone daily.  -Check BMET today  She has been referred to EP.  2. LBBB Refer to EP for possible CRT-D  3. Chronic A fib Rate controlled.  Continue xarelto 20 mg daily 4. CKD Stage III Check BMET today. 5. DMII  Follow up 3 weeks with APP and 8 weeks with Dr Haroldine Laws.   Sigourney Portillo NP-C  4:41 PM

## 2018-06-30 NOTE — Patient Instructions (Signed)
It was great to see you today! No medication changes are needed at this time.   Labs today We will only contact you if something comes back abnormal or we need to make some changes. Otherwise no news is good news!  Your physician recommends that you schedule a follow-up appointment in: 3 weeks  in the Advanced Practitioners (PA/NP) Clinic   Your physician recommends that you schedule a follow-up appointment in: 8 weeks with Dr Haroldine Laws  Do the following things EVERYDAY: 1) Weigh yourself in the morning before breakfast. Write it down and keep it in a log. 2) Take your medicines as prescribed 3) Eat low salt foods-Limit salt (sodium) to 2000 mg per day.  4) Stay as active as you can everyday 5) Limit all fluids for the day to less than 2 liters

## 2018-07-01 ENCOUNTER — Ambulatory Visit (INDEPENDENT_AMBULATORY_CARE_PROVIDER_SITE_OTHER): Payer: No Typology Code available for payment source | Admitting: Nurse Practitioner

## 2018-07-01 ENCOUNTER — Encounter: Payer: Self-pay | Admitting: Nurse Practitioner

## 2018-07-01 ENCOUNTER — Encounter: Payer: Self-pay | Admitting: *Deleted

## 2018-07-01 VITALS — BP 104/66 | HR 67 | Ht 71.0 in | Wt 213.4 lb

## 2018-07-01 DIAGNOSIS — I5022 Chronic systolic (congestive) heart failure: Secondary | ICD-10-CM

## 2018-07-01 DIAGNOSIS — I4821 Permanent atrial fibrillation: Secondary | ICD-10-CM

## 2018-07-01 NOTE — Patient Instructions (Addendum)
Medication Instructions:   NONE ORDERED  TODAY   If you need a refill on your cardiac medications before your next appointment, please call your pharmacy.   Lab work:  CBC BMET   If you have labs (blood work) drawn today and your tests are completely normal, you will receive your results only by: Marland Kitchen MyChart Message (if you have MyChart) OR . A paper copy in the mail If you have any lab test that is abnormal or we need to change your treatment, we will call you to review the results.  Testing/Procedures: SEE LETTER FOR DEVICE INSERTION    Follow-Up: AFTER  07-13-18   10 TO 14 DAYS WITH DEVICE CLINIC   91 DAYS WITH DR Calton Golds DEFIB [facility]     Any Other Special Instructions Will Be Listed Below (If Applicable).

## 2018-07-02 LAB — CBC
Hematocrit: 39.3 % (ref 37.5–51.0)
Hemoglobin: 13 g/dL (ref 13.0–17.7)
MCH: 31.2 pg (ref 26.6–33.0)
MCHC: 33.1 g/dL (ref 31.5–35.7)
MCV: 94 fL (ref 79–97)
PLATELETS: 219 10*3/uL (ref 150–450)
RBC: 4.17 x10E6/uL (ref 4.14–5.80)
RDW: 13.9 % (ref 12.3–15.4)
WBC: 4.4 10*3/uL (ref 3.4–10.8)

## 2018-07-02 LAB — BASIC METABOLIC PANEL
BUN / CREAT RATIO: 19 (ref 10–24)
BUN: 24 mg/dL (ref 8–27)
CALCIUM: 9.3 mg/dL (ref 8.6–10.2)
CHLORIDE: 99 mmol/L (ref 96–106)
CO2: 26 mmol/L (ref 20–29)
Creatinine, Ser: 1.26 mg/dL (ref 0.76–1.27)
GFR, EST AFRICAN AMERICAN: 66 mL/min/{1.73_m2} (ref >59)
GFR, EST NON AFRICAN AMERICAN: 57 mL/min/{1.73_m2} — AB (ref >59)
Glucose: 120 mg/dL — ABNORMAL HIGH (ref 65–99)
Potassium: 4.3 mmol/L (ref 3.5–5.2)
Sodium: 139 mmol/L (ref 134–144)

## 2018-07-05 ENCOUNTER — Inpatient Hospital Stay (HOSPITAL_COMMUNITY): Payer: Self-pay

## 2018-07-05 ENCOUNTER — Telehealth: Payer: Self-pay

## 2018-07-05 NOTE — Telephone Encounter (Signed)
-----   Message from Patsey Berthold, NP sent at 07/03/2018  4:07 PM EST ----- Please notify patient of stable labs. Thanks!

## 2018-07-05 NOTE — Telephone Encounter (Signed)
Notes recorded by Frederik Schmidt, RN on 07/05/2018 at 8:40 AM EST The patient has been notified of the result and verbalized understanding. All questions (if any) were answered. Frederik Schmidt, RN 07/05/2018 8:39 AM

## 2018-07-05 NOTE — Telephone Encounter (Signed)
-----   Message from Kyle Berthold, NP sent at 07/03/2018  4:07 PM EST ----- Please notify patient of stable labs. Thanks!

## 2018-07-06 NOTE — Telephone Encounter (Signed)
Call placed to Pt.  Advised to arrive to [facility] Admitting at 7:30 am on July 13 2018 for ICD implant.  Pt indicates understanding.

## 2018-07-09 ENCOUNTER — Telehealth: Payer: Self-pay | Admitting: Internal Medicine

## 2018-07-09 NOTE — Telephone Encounter (Signed)
New message:       Pt is calling to see if he can take his night time medicine the day before his procedure. He states the paper says he can not drink or eat after 6 pm. Please advise.

## 2018-07-09 NOTE — Telephone Encounter (Signed)
Returned call to Pt family.  Gave corrected information regarding procedure.  Advised Pt to hold Xarelto for 2 days prior to procedure.  Advised to hold all AM medications on the morning of procedure.  Advised Pt could eat and drink night before procedure.  Advised to not eat the morning of procedure.  No further action needed.

## 2018-07-13 ENCOUNTER — Encounter (HOSPITAL_COMMUNITY)
Admission: RE | Disposition: A | Discharge status: Home / Self Care | Payer: Self-pay | Source: Ambulatory Visit | Attending: Internal Medicine

## 2018-07-13 ENCOUNTER — Encounter (HOSPITAL_COMMUNITY): Payer: Self-pay | Admitting: General Practice

## 2018-07-13 ENCOUNTER — Other Ambulatory Visit: Payer: Self-pay

## 2018-07-13 ENCOUNTER — Ambulatory Visit (HOSPITAL_COMMUNITY)
Admission: RE | Admit: 2018-07-13 | Discharge: 2018-07-14 | Disposition: A | Discharge status: Home / Self Care | Payer: Self-pay | Source: Ambulatory Visit | Attending: Internal Medicine | Admitting: Internal Medicine

## 2018-07-13 DIAGNOSIS — Z950 Presence of cardiac pacemaker: Secondary | ICD-10-CM

## 2018-07-13 DIAGNOSIS — I459 Conduction disorder, unspecified: Secondary | ICD-10-CM

## 2018-07-13 DIAGNOSIS — Z823 Family history of stroke: Secondary | ICD-10-CM | POA: Insufficient documentation

## 2018-07-13 DIAGNOSIS — I428 Other cardiomyopathies: Secondary | ICD-10-CM | POA: Insufficient documentation

## 2018-07-13 DIAGNOSIS — I4821 Permanent atrial fibrillation: Secondary | ICD-10-CM | POA: Insufficient documentation

## 2018-07-13 DIAGNOSIS — Z8249 Family history of ischemic heart disease and other diseases of the circulatory system: Secondary | ICD-10-CM | POA: Insufficient documentation

## 2018-07-13 DIAGNOSIS — M179 Osteoarthritis of knee, unspecified: Secondary | ICD-10-CM | POA: Insufficient documentation

## 2018-07-13 DIAGNOSIS — I110 Hypertensive heart disease with heart failure: Secondary | ICD-10-CM | POA: Insufficient documentation

## 2018-07-13 DIAGNOSIS — I482 Chronic atrial fibrillation, unspecified: Secondary | ICD-10-CM

## 2018-07-13 DIAGNOSIS — I2510 Atherosclerotic heart disease of native coronary artery without angina pectoris: Secondary | ICD-10-CM | POA: Insufficient documentation

## 2018-07-13 DIAGNOSIS — Z79899 Other long term (current) drug therapy: Secondary | ICD-10-CM | POA: Insufficient documentation

## 2018-07-13 DIAGNOSIS — E119 Type 2 diabetes mellitus without complications: Secondary | ICD-10-CM | POA: Insufficient documentation

## 2018-07-13 DIAGNOSIS — Z7984 Long term (current) use of oral hypoglycemic drugs: Secondary | ICD-10-CM | POA: Insufficient documentation

## 2018-07-13 DIAGNOSIS — Z7901 Long term (current) use of anticoagulants: Secondary | ICD-10-CM | POA: Insufficient documentation

## 2018-07-13 DIAGNOSIS — Z9889 Other specified postprocedural states: Secondary | ICD-10-CM | POA: Insufficient documentation

## 2018-07-13 DIAGNOSIS — I5022 Chronic systolic (congestive) heart failure: Secondary | ICD-10-CM

## 2018-07-13 HISTORY — DX: Unspecified osteoarthritis, unspecified site: M19.90

## 2018-07-13 HISTORY — DX: Procedure and treatment not carried out because of patient's decision for reasons of belief and group pressure: Z53.1

## 2018-07-13 HISTORY — PX: BIV PACEMAKER INSERTION CRT-P: EP1199

## 2018-07-13 HISTORY — DX: Presence of cardiac pacemaker: Z95.0

## 2018-07-13 HISTORY — DX: Pure hypercholesterolemia, unspecified: E78.00

## 2018-07-13 HISTORY — DX: Type 2 diabetes mellitus without complications: E11.9

## 2018-07-13 HISTORY — PX: BI-VENTRICULAR PACEMAKER INSERTION (CRT-P): SHX5750

## 2018-07-13 HISTORY — DX: Reserved for inherently not codable concepts without codable children: IMO0001

## 2018-07-13 LAB — GLUCOSE, CAPILLARY
GLUCOSE-CAPILLARY: 110 mg/dL — AB (ref 70–99)
GLUCOSE-CAPILLARY: 109 mg/dL — AB (ref 70–99)
Glucose-Capillary: 82 mg/dL (ref 70–99)
Glucose-Capillary: 75 mg/dL (ref 70–99)

## 2018-07-13 LAB — SURGICAL PCR SCREEN
MRSA, PCR: NEGATIVE
STAPHYLOCOCCUS AUREUS: NEGATIVE

## 2018-07-13 SURGERY — BIV PACEMAKER INSERTION CRT-P

## 2018-07-13 MED ORDER — HEPARIN (PORCINE) IN NACL 1000-0.9 UT/500ML-% IV SOLN
INTRAVENOUS | Status: DC | PRN
  Administered 2018-07-13: 500 mL

## 2018-07-13 MED ORDER — SODIUM CHLORIDE 0.9 % IV SOLN
INTRAVENOUS | Status: AC
  Filled 2018-07-13: qty 2

## 2018-07-13 MED ORDER — ATORVASTATIN CALCIUM 80 MG PO TABS
80.0000 mg | ORAL_TABLET | Freq: Every day | ORAL | Status: DC
  Administered 2018-07-13 – 2018-07-14 (×2): 80 mg via ORAL
  Filled 2018-07-13 (×2): qty 1

## 2018-07-13 MED ORDER — IOHEXOL 350 MG/ML SOLN
INTRAVENOUS | Status: DC | PRN
  Administered 2018-07-13: 16 mL via INTRAVENOUS

## 2018-07-13 MED ORDER — SODIUM CHLORIDE 0.9 % IV SOLN
INTRAVENOUS | Status: DC | PRN
  Administered 2018-07-13: 250 mL via INTRAVENOUS

## 2018-07-13 MED ORDER — MIDAZOLAM HCL 5 MG/5ML IJ SOLN
INTRAMUSCULAR | Status: AC
  Filled 2018-07-13: qty 5

## 2018-07-13 MED ORDER — LOSARTAN POTASSIUM 25 MG PO TABS
12.5000 mg | ORAL_TABLET | Freq: Every day | ORAL | Status: DC
  Administered 2018-07-13 – 2018-07-14 (×2): 12.5 mg via ORAL
  Filled 2018-07-13 (×2): qty 1

## 2018-07-13 MED ORDER — LIDOCAINE HCL (PF) 1 % IJ SOLN
INTRAMUSCULAR | Status: AC
  Filled 2018-07-13: qty 60

## 2018-07-13 MED ORDER — CARVEDILOL 3.125 MG PO TABS
3.1250 mg | ORAL_TABLET | Freq: Two times a day (BID) | ORAL | Status: DC
  Administered 2018-07-13 – 2018-07-14 (×2): 3.125 mg via ORAL
  Filled 2018-07-13 (×3): qty 1

## 2018-07-13 MED ORDER — ACETAMINOPHEN 325 MG PO TABS
325.0000 mg | ORAL_TABLET | ORAL | Status: DC | PRN
  Administered 2018-07-13 – 2018-07-14 (×2): 650 mg via ORAL
  Filled 2018-07-13 (×3): qty 2

## 2018-07-13 MED ORDER — POTASSIUM CHLORIDE ER 10 MEQ PO TBCR
20.0000 meq | EXTENDED_RELEASE_TABLET | Freq: Every day | ORAL | Status: DC
  Administered 2018-07-14: 20 meq via ORAL
  Filled 2018-07-13 (×3): qty 2

## 2018-07-13 MED ORDER — FENTANYL CITRATE (PF) 100 MCG/2ML IJ SOLN
INTRAMUSCULAR | Status: DC | PRN
  Administered 2018-07-13 (×4): 12.5 ug via INTRAVENOUS

## 2018-07-13 MED ORDER — FERROUS GLUCONATE 324 (38 FE) MG PO TABS
324.0000 mg | ORAL_TABLET | Freq: Every day | ORAL | Status: DC
  Administered 2018-07-14: 324 mg via ORAL
  Filled 2018-07-13: qty 1

## 2018-07-13 MED ORDER — SPIRONOLACTONE 25 MG PO TABS
25.0000 mg | ORAL_TABLET | Freq: Every day | ORAL | Status: DC
  Administered 2018-07-13 – 2018-07-14 (×2): 25 mg via ORAL
  Filled 2018-07-13 (×2): qty 1

## 2018-07-13 MED ORDER — SODIUM CHLORIDE 0.9 % IV SOLN
80.0000 mg | INTRAVENOUS | Status: AC
  Administered 2018-07-13: 80 mg
  Filled 2018-07-13: qty 2

## 2018-07-13 MED ORDER — METOPROLOL TARTRATE 5 MG/5ML IV SOLN
INTRAVENOUS | Status: AC
  Filled 2018-07-13: qty 5

## 2018-07-13 MED ORDER — MUPIROCIN 2 % EX OINT
TOPICAL_OINTMENT | CUTANEOUS | Status: AC
  Administered 2018-07-13: 10:00:00
  Filled 2018-07-13: qty 22

## 2018-07-13 MED ORDER — ONDANSETRON HCL 4 MG/2ML IJ SOLN: 4.0000 mg | Freq: Four times a day (QID) | INTRAMUSCULAR | Status: DC | PRN

## 2018-07-13 MED ORDER — CEFAZOLIN SODIUM-DEXTROSE 1-4 GM/50ML-% IV SOLN
1.0000 g | Freq: Four times a day (QID) | INTRAVENOUS | Status: AC
  Administered 2018-07-13 – 2018-07-14 (×3): 1 g via INTRAVENOUS
  Filled 2018-07-13 (×3): qty 50

## 2018-07-13 MED ORDER — FENTANYL CITRATE (PF) 100 MCG/2ML IJ SOLN
INTRAMUSCULAR | Status: AC
  Filled 2018-07-13: qty 2

## 2018-07-13 MED ORDER — SODIUM CHLORIDE 0.9 % IV SOLN
INTRAVENOUS | Status: DC
  Administered 2018-07-13: 10:00:00 via INTRAVENOUS

## 2018-07-13 MED ORDER — CHLORHEXIDINE GLUCONATE 4 % EX LIQD
60.0000 mL | Freq: Once | CUTANEOUS | Status: DC
  Filled 2018-07-13: qty 60

## 2018-07-13 MED ORDER — METOPROLOL TARTRATE 5 MG/5ML IV SOLN
INTRAVENOUS | Status: DC | PRN
  Administered 2018-07-13: 5 mg via INTRAVENOUS

## 2018-07-13 MED ORDER — GLIPIZIDE 5 MG PO TABS
5.0000 mg | ORAL_TABLET | Freq: Every day | ORAL | Status: DC
  Administered 2018-07-14: 5 mg via ORAL
  Filled 2018-07-13: qty 1

## 2018-07-13 MED ORDER — IOPAMIDOL (ISOVUE-370) INJECTION 76%
INTRAVENOUS | Status: AC
  Filled 2018-07-13: qty 50

## 2018-07-13 MED ORDER — CEFAZOLIN SODIUM-DEXTROSE 2-4 GM/100ML-% IV SOLN
INTRAVENOUS | Status: AC
  Filled 2018-07-13: qty 100

## 2018-07-13 MED ORDER — MUPIROCIN 2 % EX OINT
1.0000 "application " | TOPICAL_OINTMENT | Freq: Once | CUTANEOUS | Status: AC
  Administered 2018-07-13: 1 via TOPICAL
  Filled 2018-07-13: qty 22

## 2018-07-13 MED ORDER — MIDAZOLAM HCL 5 MG/5ML IJ SOLN
INTRAMUSCULAR | Status: DC | PRN
  Administered 2018-07-13 (×4): 1 mg via INTRAVENOUS

## 2018-07-13 MED ORDER — CEFAZOLIN SODIUM-DEXTROSE 2-4 GM/100ML-% IV SOLN
2.0000 g | INTRAVENOUS | Status: AC
  Filled 2018-07-13: qty 100

## 2018-07-13 MED ORDER — HEPARIN (PORCINE) IN NACL 1000-0.9 UT/500ML-% IV SOLN
INTRAVENOUS | Status: AC
  Filled 2018-07-13: qty 500

## 2018-07-13 MED ORDER — LIDOCAINE HCL (PF) 1 % IJ SOLN
INTRAMUSCULAR | Status: DC | PRN
  Administered 2018-07-13: 45 mL

## 2018-07-13 MED ORDER — CEFAZOLIN SODIUM-DEXTROSE 2-3 GM-%(50ML) IV SOLR
INTRAVENOUS | Status: AC | PRN
  Administered 2018-07-13: 2 g via INTRAVENOUS

## 2018-07-13 MED ORDER — ACETAMINOPHEN 325 MG PO TABS
ORAL_TABLET | ORAL | Status: AC
  Filled 2018-07-13: qty 2

## 2018-07-13 MED ORDER — FUROSEMIDE 40 MG PO TABS
40.0000 mg | ORAL_TABLET | Freq: Two times a day (BID) | ORAL | Status: DC
  Administered 2018-07-13 – 2018-07-14 (×2): 40 mg via ORAL
  Filled 2018-07-13 (×2): qty 1

## 2018-07-13 SURGICAL SUPPLY — 20 items
CABLE SURGICAL S-101-97-12 (CABLE) ×3 IMPLANT
CATH CPS DIRECT WD DS2C028 (CATHETERS) ×2 IMPLANT
CATH HEX JOS 2-5-2 65CM 6F REP (CATHETERS) ×2 IMPLANT
CATH RIGHTSITE C315HIS02 (CATHETERS) ×2 IMPLANT
CPS IMPLANT KIT 410190 (MISCELLANEOUS) ×2 IMPLANT
LEAD QUARTET 1458QL-86 (Lead) IMPLANT
LEAD SELECT SECURE 3830 383069 (Lead) ×1 IMPLANT
LEAD TENDRIL MRI 58CM LPA1200M (Lead) ×2 IMPLANT
PACEMAKER QUDR ALLR CRT PM3562 (Pacemaker) IMPLANT
PAD PRO RADIOLUCENT 2001M-C (PAD) ×3 IMPLANT
PMKR QUADRA ALLURE CRT PM3562 (Pacemaker) ×3 IMPLANT
QUARTET 1458QL-86 (Lead) ×3 IMPLANT
SELECT SECURE 3830 383069 (Lead) ×3 IMPLANT
SHEATH CLASSIC 7F (SHEATH) ×2 IMPLANT
SHEATH CLASSIC 8F (SHEATH) ×2 IMPLANT
SHEATH CLASSIC 9.5F (SHEATH) ×2 IMPLANT
SLITTER 6232ADJ (MISCELLANEOUS) ×3 IMPLANT
SLITTER UNIVERSAL DS2A003 (MISCELLANEOUS) ×2 IMPLANT
TRAY PACEMAKER INSERTION (PACKS) ×3 IMPLANT
WIRE MAILMAN 182CM (WIRE) ×2 IMPLANT

## 2018-07-13 NOTE — Progress Notes (Signed)
Patient speaks Spanish, use interpreter services.

## 2018-07-13 NOTE — Discharge Summary (Addendum)
ELECTROPHYSIOLOGY PROCEDURE DISCHARGE SUMMARY    Patient ID: Kyle Burke,  MRN: [id number], DOB/AGE: Mar 05, 1946 72 y.o.  Admit date: 07/13/2018 Discharge date: 07/14/18  Primary Care Physician: Mack Hook, MD  Primary Cardiologist: Dr. Haroldine Laws Electrophysiologist: Dr. Lovena Le  Primary Discharge Diagnosis:  1. NICM 2. LBBB  Secondary Discharge Diagnosis:  1. DM 2. Permanent AFib     CHA2DS2Vasc is 4, on Xarelto, appropriately dosed 3. HTN  No Known Allergies   Procedures This Admission:  1.  Implantation of a SJM CRT- PPM on 07/13/18 by Dr Lovena Le.  The patient received a  Saint Jude 58 cm active-fixation pacing lead, serial number [id number] ([id number]) Saint Jude left ventricular pacing lead, serial number [id number] ([id number]) Medtronic model 0160 His bundle pacing lead, serial number [id number] (HIS) Kyle Burke biventricular pacemaker, serial [id number] There were no immediate post procedure complications. 2.  CXR on 07/14/18 demonstrated no pneumothorax status post device implantation.   Brief HPI: Kyle Burke is a 72 y.o. male was referred to electrophysiology in the outpatient setting for consideration of PPM implantation.  Past medical history includes .  The patient has had symptomatic bradycardia without reversible causes identified.  Risks, benefits, and alternatives to PPM implantation were reviewed with the patient who wished to proceed.   Hospital Course:  The patient was admitted and underwent implantation of a CRT-P with details as outlined above.  He was monitored on telemetry overnight which demonstrated AF/Vpaced.  Left chest was without hematoma or ecchymosis.  The device was interrogated and found to be functioning normally.  CXR was obtained and demonstrated no pneumothorax status post device implantation.  Wound care, arm mobility, and restrictions were reviewed with the patient.  The patient feels well, no CP or SOB, he was examined by  Dr. and considered stable for discharge to home.  We will increase his coreg and add digoxin to his regime.  EKG and dig level, BP to be done at his wound check visit (are ordered), the patient instructed not to take the digoxin the day of his visit/lab until after the blood is drawn.  The visit was done today with the aid of [employer] interpretor Kyle Burke The patient's daughter-in-law is at bedside, speaks fluent [place] and Spanish, and available for help at home as well.   Physical Exam: Vitals:   07/13/18 1800 07/13/18 1900 07/14/18 0040 07/14/18 0538  BP: 112/79 110/75 114/79 121/83  Pulse: 90 91 94 91  Resp:   18 18  Temp:   98.4 F (36.9 C) 97.7 F (36.5 C)  TempSrc:   Oral Oral  SpO2: 99%  95% 93%  Weight:    95.6 kg  Height:        GEN- The patient is well appearing, alert and oriented x 3 today.   HEENT: normocephalic, atraumatic; sclera clear, conjunctiva pink; hearing intact; oropharynx clear; neck supple, no JVP Lungs- CTA b/l, normal work of breathing.  No wheezes, rales, rhonchi Heart- RRR, no murmurs, rubs or gallops, PMI not laterally displaced GI- soft, non-tender, non-distended Extremities- no clubbing, cyanosis, or edema MS- no significant deformity or atrophy Skin- warm and dry, no rash or lesion, left chest without hematoma/ecchymosis Psych- euthymic mood, full affect Neuro- no gross deficits   Labs:   Lab Results  Component Value Date   WBC 4.4 07/01/2018   HGB 13.0 07/01/2018   HCT 39.3 07/01/2018   MCV 94 07/01/2018   PLT 219 07/01/2018   No results  for input(s): NA, K, CL, CO2, BUN, CREATININE, CALCIUM, PROT, BILITOT, ALKPHOS, ALT, AST, GLUCOSE in the last 168 hours.  Invalid input(s): LABALBU  Discharge Medications:  Allergies as of 07/14/2018   No Known Allergies     Medication List    TAKE these medications   acetaminophen 650 MG CR tablet Commonly known as:  TYLENOL Take 650 mg by mouth every 8 (eight) hours as needed  for pain. Reported on 02/14/2016   AGAMATRIX PRESTO PRO METER Devi Twice daily blood glucose checks before meals   atorvastatin 80 MG tablet Commonly known as:  LIPITOR Take 80 mg by mouth daily.   carvedilol 6.25 MG tablet Commonly known as:  COREG Take 1 tablet (6.25 mg total) by mouth 2 (two) times daily with a meal. What changed:    medication strength  how much to take   digoxin 0.125 MG tablet Commonly known as:  LANOXIN Take 1 tablet (125 mcg total) by mouth daily.   ferrous gluconate 324 MG tablet Commonly known as:  FERGON Take 1 tablet (324 mg total) by mouth daily with breakfast.   furosemide 40 MG tablet Commonly known as:  LASIX Take 1 tablet (40 mg total) by mouth 2 (two) times daily.   glipiZIDE 5 MG tablet Commonly known as:  GLUCOTROL Take 5 mg by mouth daily before breakfast.   glucose blood test strip Twice daily glucose checks before meals   losartan 25 MG tablet Commonly known as:  COZAAR Take 0.5 tablets (12.5 mg total) by mouth daily.   potassium chloride 10 MEQ tablet Commonly known as:  K-DUR Take 20 mEq by mouth daily.   spironolactone 25 MG tablet Commonly known as:  ALDACTONE Take 1 tablet (25 mg total) by mouth daily.   XARELTO 20 MG Tabs tablet Generic drug:  rivaroxaban TAKE 1 TABLET BY MOUTH ONCE DAILY WITH SUPPER What changed:  See the new instructions. Notes to patient:  Do not resume until 07/17/18 evening       Disposition:  Home  Discharge Instructions    Diet - low sodium heart healthy   Complete by:  As directed    Increase activity slowly   Complete by:  As directed      Follow-up Information    [facility] Follow up.   Specialty:  Cardiology Why:  07/28/18 @ 2:30PM, wound check visit, EKG and bloodwork.  Please do not take the digoxin (Lanoxin) the day of this appointment until after bloodwork is drawn as we discussed Contact information: [address] [place]       Evans Lance, MD Follow up.   Specialty:  Cardiology Why:  10/12/18 @ 10:30AM Contact information: [address] [address] [phone]           Duration of Discharge Encounter: Greater than 30 minutes including physician time.  Venetia Night, Burke-C 07/14/2018 10:20 AM  EP attending  Patient seen and examined.  Agree with the findings as noted above.  Interrogation of his pacemaker under my direction demonstrates normal device function.  His chest x-ray looks good.  He will be discharged home with follow-up as noted above.  Cristopher Peru, MD

## 2018-07-13 NOTE — Progress Notes (Signed)
Eating [place] sandwich lunch. Family in. Waiting on tele bed

## 2018-07-13 NOTE — Progress Notes (Signed)
Patient arrived to unit, left upper chest site looks good. Patient states he has a little pain, but already received tylenol.

## 2018-07-13 NOTE — H&P (Signed)
Electrophysiology Office Note Date: 07/04/2018  ID:  Kyle Burke 12-06-1945, MRN [id number]  PCP: Mack Hook, MD Primary Cardiologist: [place] Electrophysiologist: Lovena Le  CC: to discuss CRTP implant  Kyle Burke is a 72 y.o. male seen today for Dr Lovena Le. He is seen with his family and Spanish interpretor.  Since discharge, he reports doing fairly well.  Shortness of breath improved from admission.  He has also been seen by general surgery and is being considered for open hernia repair.  He denies chest pain, palpitations, dyspnea (above baseline), PND, orthopnea, nausea, vomiting, dizziness, syncope, edema, weight gain, or early satiety.      Past Medical History:  Diagnosis Date  . Atrial fibrillation ([place]) 2016  . CAD (coronary artery disease)   . Diabetes mellitus 2001  . Hypertension 2006  . 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 Acie Fredrickson, Cardiology  EF 25%  Adenosine Cardiolite did not support ischemic etiology  . OA (osteoarthritis) of knee 01/08/2017  . Pleural effusion         Past Surgical History:  Procedure Laterality Date  . COLONOSCOPY WITH PROPOFOL N/A 05/14/2016   Procedure: COLONOSCOPY WITH PROPOFOL;  Surgeon: Jerene Bears, MD;  Location: [facility];  Service: Gastroenterology;  Laterality: N/A;  . ESOPHAGOGASTRODUODENOSCOPY (EGD) WITH PROPOFOL N/A 05/14/2016   Procedure: ESOPHAGOGASTRODUODENOSCOPY (EGD) WITH PROPOFOL;  Surgeon: Jerene Bears, MD;  Location: [facility];  Service: Gastroenterology;  Laterality: N/A;  . HERNIA REPAIR    . INCISE AND DRAIN ABCESS  1986   on leg  . INGUINAL HERNIA REPAIR  01/16/2012   Procedure: LAPAROSCOPIC INGUINAL HERNIA;  Surgeon: Gayland Curry, MD,FACS;  Location: [facility];  Service: General;  Laterality: Left;  Laparoscopic incarcerated Left inguinal hernia repair with mesh, umbilical hernia repair  . UMBILICAL HERNIA REPAIR  01/16/2012    Procedure: HERNIA REPAIR UMBILICAL ADULT;  Surgeon: Gayland Curry, MD,FACS;  Location: [facility];  Service: General;  Laterality: N/A;          Current Outpatient Medications  Medication Sig Dispense Refill  . acetaminophen (TYLENOL) 650 MG CR tablet Take 650 mg by mouth every 8 (eight) hours as needed for pain. Reported on 02/14/2016    . atorvastatin (LIPITOR) 80 MG tablet Take 1 tablet by mouth daily.    . Blood Glucose Monitoring Suppl (AGAMATRIX PRESTO PRO METER) DEVI Twice daily blood glucose checks before meals 1 Device 0  . carvedilol (COREG) 3.125 MG tablet Take 1 tablet (3.125 mg total) by mouth 2 (two) times daily with a meal. 60 tablet 11  . ferrous gluconate (FERGON) 324 MG tablet Take 1 tablet (324 mg total) by mouth daily with breakfast.  3  . furosemide (LASIX) 40 MG tablet Take 1 tablet (40 mg total) by mouth 2 (two) times daily. 60 tablet 11  . glipiZIDE (GLUCOTROL) 5 MG tablet Take by mouth daily before breakfast.    . glucose blood (AGAMATRIX PRESTO TEST) test strip Twice daily glucose checks before meals 100 each 12  . losartan (COZAAR) 25 MG tablet Take 0.5 tablets (12.5 mg total) by mouth daily. 30 tablet 11  . spironolactone (ALDACTONE) 25 MG tablet Take 1 tablet (25 mg total) by mouth daily. 30 tablet 11  . XARELTO 20 MG TABS tablet TAKE 1 TABLET BY MOUTH ONCE DAILY WITH SUPPER 30 tablet 11   No current facility-administered medications for this visit.     Allergies:   Patient has no known  allergies.   Social History: Social History        Socioeconomic History  . Marital status: Married    Spouse name: Not on file  . Number of children: 5  . Years of education: 9  . Highest education level: Not on file  Occupational History  . Occupation: Ambulance person constrution    Comment: Barista  Social Needs  . Financial resource strain: Not on file  . Food insecurity:    Worry: Not on file    Inability: Not on file  . Transportation needs:      Medical: Not on file    Non-medical: Not on file  Tobacco Use  . Smoking status: Never Smoker  . Smokeless tobacco: Never Used  Substance and Sexual Activity  . Alcohol use: No    Alcohol/week: 0.0 standard drinks    Comment: rare occ., formerly more  . Drug use: No  . Sexual activity: Yes    Comment: But not regular  Lifestyle  . Physical activity:    Days per week: Not on file    Minutes per session: Not on file  . Stress: Not on file  Relationships  . Social connections:    Talks on phone: Not on file    Gets together: Not on file    Attends religious service: Not on file    Active member of club or organization: Not on file    Attends meetings of clubs or organizations: Not on file    Relationship status: Not on file  . Intimate partner violence:    Fear of current or ex partner: Not on file    Emotionally abused: Not on file    Physically abused: Not on file    Forced sexual activity: Not on file  Other Topics Concern  . Not on file  Social History Narrative   Originally from [place]   Came to [place] In 1995   Lives at home with wife, a son and his wife and 3 grandchildren.    Family History:      Family History  Problem Relation Age of Onset  . Stroke Mother   . Heart disease Mother   . Heart disease Father   . Diabetes Father   . Kidney disease Father        , history of stones as well  . Diabetes Brother   . Cancer Sister 58       unknown soft tissue cancer of leg    Review of Systems: All other systems reviewed and are otherwise negative except as noted above.   Physical Exam: VS:  BP 104/66   Pulse 67   Ht 5\' 11"  (1.803 m)   Wt 213 lb 6.4 oz (96.8 kg)   SpO2 97%   BMI 29.76 kg/m  , BMI Body mass index is 29.76 kg/m.    Wt Readings from Last 3 Encounters:  07/01/18 213 lb 6.4 oz (96.8 kg)  06/30/18 215 lb 3.2 oz (97.6 kg)  06/24/18 221 lb (100.2 kg)    GEN- The patient is well  appearing, alert and oriented x 3 today.   HEENT: normocephalic, atraumatic; sclera clear, conjunctiva pink; hearing intact; oropharynx clear; neck supple  Lungs- Clear to ausculation bilaterally, normal work of breathing.  No wheezes, rales, rhonchi Heart- Irregular rate and rhythm  GI- soft, non-tender, non-distended, bowel sounds present  Extremities- no clubbing, cyanosis, or edema; DP/PT/radial pulses 2+ bilaterally MS- no significant deformity or atrophy Skin- warm and dry,  no rash or lesion  Psych- euthymic mood, full affect Neuro- strength and sensation are intact   EKG:  EKG is not ordered today.  Recent Labs: 06/18/2018: B Natriuretic Peptide >4,500.0; TSH 2.355 06/21/2018: Magnesium 2.2 06/23/2018: ALT 37 07/01/2018: BUN 24; Creatinine, Ser 1.26; Hemoglobin 13.0; Platelets 219; Potassium 4.3; Sodium 139    Other studies Reviewed: Additional studies/ records that were reviewed today include: hospital records    Assessment and Plan:  1.  NICM/CHF/IVCD The patient has NICM despite guideline directed therapy.  He does not have classic LBBB but his QRS is very wide with IVCD and there is potential he would benefit from CRT.  With poor long term prognosis, would not pursue ICD implant (discussed with Dr Haroldine Laws during last admission). Risks, benefits to CRTP implant reviewed with patient and family who wish to proceed. Will schedule with Dr Lovena Le at the next available time. Hold Xarelto for 1 day prior to implant.   2.  Permanent atrial fibrillation LA size is 75 Continue Xarelto for CHADS2VASC of at least 4  3.  Surgical clearance He will need consideration of open repair of hernia Would wait until after CRTP and see how his HF status does before making a decision about surgery   Current medicines are reviewed at length with the patient today.   The patient does not have concerns regarding his medicines.  The following changes were made today:  none  Labs/  tests ordered today include: pre-procedure labs    Orders Placed This Encounter  Procedures  . CBC  . Basic metabolic panel     Disposition:   Follow up with Dr Lovena Le after CRTP implant      Signed, Chanetta Marshall, NP 07/04/2018 7:20 AM   [facility] [address] [place] [address] [phone] (office) [phone] (fax)  EP Attending Patient seen and examined. He is known to me from his initial presentation 3 years ago where he had class 1 symptoms, LV dysfunction and did not meed criteria for ICD insertion. His EF has worsened and his CHF is class 3B. He has chronic atrial fib and a QRS duration of 186. He went from narrow, to LBBB, now with progressive disease with an IVCD. I have discussed with Dr. Reine Just and Chanetta Marshall, and we will plan a biv PPM and place a His bundle lead as well. The risks/benefits/goals/expectations of the procedure have been discussed and he wishes to proceed.  Mikle Bosworth.D.

## 2018-07-13 NOTE — Progress Notes (Signed)
Patient states he is Jehovah's witness (no blood products if recommended).

## 2018-07-13 NOTE — Plan of Care (Signed)
  Problem: Education: Goal: Knowledge of General Education information will improve Description: Including pain rating scale, medication(s)/side effects and non-pharmacologic comfort measures Outcome: Progressing   Problem: Education: Goal: Knowledge of General Education information will improve Description: Including pain rating scale, medication(s)/side effects and non-pharmacologic comfort measures Outcome: Progressing   Problem: Activity: Goal: Risk for activity intolerance will decrease Outcome: Progressing   

## 2018-07-14 ENCOUNTER — Encounter (HOSPITAL_COMMUNITY): Payer: Self-pay | Admitting: Internal Medicine

## 2018-07-14 ENCOUNTER — Other Ambulatory Visit: Payer: Self-pay | Admitting: Physician Assistant

## 2018-07-14 ENCOUNTER — Ambulatory Visit (HOSPITAL_COMMUNITY): Payer: Self-pay

## 2018-07-14 DIAGNOSIS — I4821 Permanent atrial fibrillation: Secondary | ICD-10-CM

## 2018-07-14 LAB — GLUCOSE, CAPILLARY: Glucose-Capillary: 94 mg/dL (ref 70–99)

## 2018-07-14 MED ORDER — CARVEDILOL 6.25 MG PO TABS: 6.2500 mg | ORAL_TABLET | Freq: Two times a day (BID) | ORAL | 6 refills | Status: DC

## 2018-07-14 MED ORDER — DIGOXIN 125 MCG PO TABS: 125.0000 ug | ORAL_TABLET | Freq: Every day | ORAL | 6 refills | Status: DC

## 2018-07-14 NOTE — Plan of Care (Signed)
  Problem: Education: Goal: Knowledge of General Education information will improve Description: Including pain rating scale, medication(s)/side effects and non-pharmacologic comfort measures Outcome: Progressing   Problem: Clinical Measurements: Goal: Ability to maintain clinical measurements within normal limits will improve Outcome: Progressing Goal: Will remain free from infection Outcome: Progressing   Problem: Nutrition: Goal: Adequate nutrition will be maintained Outcome: Progressing   

## 2018-07-14 NOTE — Discharge Instructions (Signed)
Supplemental Discharge Instructions for  Pacemaker/Defibrillator Patients  Activity No heavy lifting or vigorous activity with your left/right arm for 6 to 8 weeks.  Do not raise your left/right arm above your head for one week.  Gradually raise your affected arm as drawn below.             07/17/18                   07/18/18                    07/19/18                   07/20/18 __  NO DRIVING for  1 week   ; you may begin driving on  95/6/21   .  WOUND CARE - Keep the wound area clean and dry.  Do not get this area wet for one week. No showers for one week; you may shower on  07/20/18   . - The tape/steri-strips on your wound will fall off; do not pull them off.  No bandage is needed on the site.  DO  NOT apply any creams, oils, or ointments to the wound area. - If you notice any drainage or discharge from the wound, any swelling or bruising at the site, or you develop a fever > 101? F after you are discharged home, call the office at once.  Special Instructions - You are still able to use cellular telephones; use the ear opposite the side where you have your pacemaker/defibrillator.  Avoid carrying your cellular phone near your device. - When traveling through airports, show security personnel your identification card to avoid being screened in the metal detectors.  Ask the security personnel to use the hand wand. - Avoid arc welding equipment, MRI testing (magnetic resonance imaging), TENS units (transcutaneous nerve stimulators).  Call the office for questions about other devices. - Avoid electrical appliances that are in poor condition or are not properly grounded. - Microwave ovens are safe to be near or to operate.   - - - - - - - - - - - - - - - - - - - - - - - - - - - - - - - - - - - - - - - - - - - - - - - - - - - - - - - - - - - - - - - - - - - - - - - - - - - - - - - - - - - - - - - - - - - - - - - - - - - - - - - - - - - -  Information on my medicine - XARELTO  (Rivaroxaban)  Why was Xarelto prescribed for you? Xarelto was prescribed for you to reduce the risk of a blood clot forming that can cause a stroke if you have a medical condition called atrial fibrillation (a type of irregular heartbeat).  What do you need to know about xarelto ? Take your Xarelto ONCE DAILY at the same time every day with your evening meal. If you have difficulty swallowing the tablet whole, you may crush it and mix in applesauce just prior to taking your dose.  Take Xarelto exactly as prescribed by your doctor and DO NOT stop taking Xarelto without talking to the doctor who prescribed the medication.  Stopping without other stroke prevention medication to take the place of Xarelto  may increase your risk of developing a clot that causes a stroke.  Refill your prescription before you run out.  After discharge, you should have regular check-up appointments with your healthcare provider that is prescribing your Xarelto.  In the future your dose may need to be changed if your kidney function or weight changes by a significant amount.  What do you do if you miss a dose? If you are taking Xarelto ONCE DAILY and you miss a dose, take it as soon as you remember on the same day then continue your regularly scheduled once daily regimen the next day. Do not take two doses of Xarelto at the same time or on the same day.   Important Safety Information A possible side effect of Xarelto is bleeding. You should call your healthcare provider right away if you experience any of the following: ? Bleeding from an injury or your nose that does not stop. ? Unusual colored urine (red or dark brown) or unusual colored stools (red or black). ? Unusual bruising for unknown reasons. ? A serious fall or if you hit your head (even if there is no bleeding).  Some medicines may interact with Xarelto and might increase your risk of bleeding while on Xarelto. To help avoid this, consult your  healthcare provider or pharmacist prior to using any new prescription or non-prescription medications, including herbals, vitamins, non-steroidal anti-inflammatory drugs (NSAIDs) and supplements.  This website has more information on Xarelto: [email or web address]-benson.[email or web address]

## 2018-07-14 NOTE — Care Management Note (Addendum)
Case Management Note  Patient Details  Name: Jessiah Steinhart MRN: [id number] Date of Birth: 02-Aug-1946  Subjective/Objective:    CHF, scheduled CRT                Action/Plan: Pt goes to the [facility], has appt on 07/22/2018, PCP Dr Amil Amen. Pt assessed for MATCH from previous admission. Does not qualify for [facility] program. Pt gets his meds from [facility] for discounted rate.   Expected Discharge Date:  07/14/2018              Expected Discharge Plan:  Home/Self Care  In-House Referral:  NA  Discharge planning Services  CM Consult  Post Acute Care Choice:  NA Choice offered to:  NA  DME Arranged:  N/A DME Agency:  NA  HH Arranged:  NA HH Agency:  NA  Status of Service:  Completed, signed off  If discussed at [place] of Stay Meetings, dates discussed:    Additional Comments:  Erenest Rasher, RN 07/14/2018, 9:13 AM

## 2018-07-21 ENCOUNTER — Encounter (HOSPITAL_COMMUNITY): Payer: Self-pay

## 2018-07-21 ENCOUNTER — Other Ambulatory Visit: Payer: Self-pay

## 2018-07-21 ENCOUNTER — Ambulatory Visit (HOSPITAL_COMMUNITY)
Admission: RE | Admit: 2018-07-21 | Discharge: 2018-07-21 | Disposition: A | Discharge status: Home / Self Care | Payer: No Typology Code available for payment source | Source: Ambulatory Visit | Attending: Internal Medicine | Admitting: Internal Medicine

## 2018-07-21 VITALS — BP 104/72 | HR 80 | Wt 216.6 lb

## 2018-07-21 DIAGNOSIS — I5022 Chronic systolic (congestive) heart failure: Secondary | ICD-10-CM | POA: Insufficient documentation

## 2018-07-21 DIAGNOSIS — I428 Other cardiomyopathies: Secondary | ICD-10-CM | POA: Insufficient documentation

## 2018-07-21 DIAGNOSIS — I482 Chronic atrial fibrillation, unspecified: Secondary | ICD-10-CM | POA: Insufficient documentation

## 2018-07-21 DIAGNOSIS — N183 Chronic kidney disease, stage 3 unspecified: Secondary | ICD-10-CM

## 2018-07-21 DIAGNOSIS — Z79899 Other long term (current) drug therapy: Secondary | ICD-10-CM | POA: Insufficient documentation

## 2018-07-21 DIAGNOSIS — I130 Hypertensive heart and chronic kidney disease with heart failure and stage 1 through stage 4 chronic kidney disease, or unspecified chronic kidney disease: Secondary | ICD-10-CM | POA: Insufficient documentation

## 2018-07-21 DIAGNOSIS — Z7984 Long term (current) use of oral hypoglycemic drugs: Secondary | ICD-10-CM | POA: Insufficient documentation

## 2018-07-21 DIAGNOSIS — Z7901 Long term (current) use of anticoagulants: Secondary | ICD-10-CM | POA: Insufficient documentation

## 2018-07-21 DIAGNOSIS — Z823 Family history of stroke: Secondary | ICD-10-CM | POA: Insufficient documentation

## 2018-07-21 DIAGNOSIS — Z8249 Family history of ischemic heart disease and other diseases of the circulatory system: Secondary | ICD-10-CM | POA: Insufficient documentation

## 2018-07-21 DIAGNOSIS — I447 Left bundle-branch block, unspecified: Secondary | ICD-10-CM | POA: Insufficient documentation

## 2018-07-21 DIAGNOSIS — Z950 Presence of cardiac pacemaker: Secondary | ICD-10-CM | POA: Insufficient documentation

## 2018-07-21 DIAGNOSIS — E1122 Type 2 diabetes mellitus with diabetic chronic kidney disease: Secondary | ICD-10-CM | POA: Insufficient documentation

## 2018-07-21 DIAGNOSIS — I2510 Atherosclerotic heart disease of native coronary artery without angina pectoris: Secondary | ICD-10-CM | POA: Insufficient documentation

## 2018-07-21 LAB — BASIC METABOLIC PANEL
ANION GAP: 8 (ref 5–15)
BUN: 37 mg/dL — AB (ref 8–23)
CALCIUM: 9.4 mg/dL (ref 8.9–10.3)
CO2: 28 mmol/L (ref 22–32)
Chloride: 101 mmol/L (ref 98–111)
Creatinine, Ser: 1.3 mg/dL — ABNORMAL HIGH (ref 0.61–1.24)
GFR calc Af Amer: >60 mL/min (ref >60)
GFR calc non Af Amer: 55 mL/min — ABNORMAL LOW (ref >60)
GLUCOSE: 68 mg/dL — AB (ref 70–99)
Potassium: 4.7 mmol/L (ref 3.5–5.1)
Sodium: 137 mmol/L (ref 135–145)

## 2018-07-21 LAB — DIGOXIN LEVEL: Digoxin Level: 0.3 ng/mL — ABNORMAL LOW (ref 0.8–2.0)

## 2018-07-21 NOTE — Patient Instructions (Signed)
It was great to see you today! No medication changes are needed at this time.  Labs today We will only contact you if something comes back abnormal or we need to make some changes. Otherwise no news is good news!  Keep your follow up as scheduled with Dr Haroldine Laws in January 2020   [place]... 1) Mismo peso en la manana antes del desayuno, al [employer]. 2) Tomar sus medicamentos segun lo prescripto. 3) Comer alimentos bajos en sal.  Limitar la ingesta de sodio a 2.000mg  por dia. 4) Mantenerse tan activo como sea posible todos los [place]. 5) Limitar todos los fluidos para el dia a menos de 2 litros.

## 2018-07-21 NOTE — Progress Notes (Signed)
PCP: Dr Amil Amen  Cardiologist: Dr Philbert Riser Primary HF Cardiologist: Dr Vaughan Browner  Spanish Speaking EP : Dr Lovena Le.    HPI: [name] is a 72 year old spanish speaking male with a history of  chronic systolic heart failure, NICM,  LBBB,  DMII, chroinc A fib, and chronic anticoagulation with xarelto. He does not have medical insurance. He is not a [place] citizen. He requires assistance with medications.  He has been followed by Dr Philbert Riser at [facility]. He was last seen 01/2018. He was stable at that time. Weight was 220 pounds.  Admitted with A/C systolic heart failure. Diuresed with IV lasix and once diuresed transitioned to lasix 40 mg twice a day. EP consulted for LBBB. He is being considered for charitable CRT-D. Discharge weight was 223 pounds.   Admitted 07/13/18 for scheduled CRT-P. He was discharged on 07/14/18 on higher dose of carvedilol and digoxin was started.    Today he returns for HF follow up with his daughter and wife. Overall feeling fine. Feeling much better. Able to walk more. Able to complete  Denies SOB/PND/Orthopnea. Appetite ok. No bleeding problems.  No fever or chills. Weight at home has been stable. Taking all medications. Lives with his wife and daughter.   Cardiac Testing  Echo 06/2018  Compared to a prior echo in 2017, the LVEF is further decreased to 15% with severe global hypokinesis, inferior akinesis and septal dyskinesis.RV mildly reduced.   ECHO 2017 [place] EF 20-25% RV mildly dilated. Severe hypokinesis.  RHC/ LHC [facility] 2018 at [facility] Nonobs LAD 50%,mild om2.  RA 12 PCWP 31    ROS: All systems negative except as listed in HPI, PMH and Problem List.  SH:  Social History   Socioeconomic History  . Marital status: Married    Spouse name: Not on file  . Number of children: 5  . Years of education: 9  . Highest education level: Not on file  Occupational History  . Occupation: Ambulance person constrution    Comment: Barista  Social Needs  .  Financial resource strain: Not on file  . Food insecurity:    Worry: Not on file    Inability: Not on file  . Transportation needs:    Medical: Not on file    Non-medical: Not on file  Tobacco Use  . Smoking status: Never Smoker  . Smokeless tobacco: Never Used  Substance and Sexual Activity  . Alcohol use: Yes    Alcohol/week: 0.0 standard drinks    Comment: 07/13/2018 "used to drink; none since 2015"  . Drug use: Never  . Sexual activity: Yes  Lifestyle  . Physical activity:    Days per week: Not on file    Minutes per session: Not on file  . Stress: Not on file  Relationships  . Social connections:    Talks on phone: Not on file    Gets together: Not on file    Attends religious service: Not on file    Active member of club or organization: Not on file    Attends meetings of clubs or organizations: Not on file    Relationship status: Not on file  . Intimate partner violence:    Fear of current or ex partner: Not on file    Emotionally abused: Not on file    Physically abused: Not on file    Forced sexual activity: Not on file  Other Topics Concern  . Not on file  Social History Narrative   Originally from [place]  Came to [place] In [place]   Lives at home with wife, a son and his wife and 3 grandchildren.    FH:  Family History  Problem Relation Age of Onset  . Stroke Mother   . Heart disease Mother   . Heart disease Father   . Diabetes Father   . Kidney disease Father        , history of stones as well  . Diabetes Brother   . Cancer Sister 29       unknown soft tissue cancer of leg    Past Medical History:  Diagnosis Date  . Arthritis    "knees" (07/13/2018)  . Atrial fibrillation ([place]) 2016  . CAD (coronary artery disease)   . High cholesterol   . Hypertension 2006  . Idiopathic cardiomyopathy ([place]) 11/01/2014   Followed by Dr. Acie Fredrickson, Cardiology  EF 25%  Adenosine Cardiolite did not support ischemic etiology  . OA (osteoarthritis) of knee 01/08/2017   . Pleural effusion   . Presence of permanent cardiac pacemaker 07/13/2018  . Refusal of blood transfusions as patient is Jehovah's Witness   . Type II diabetes mellitus ([place]) 2001    Current Outpatient Medications  Medication Sig Dispense Refill  . acetaminophen (TYLENOL) 650 MG CR tablet Take 650 mg by mouth every 8 (eight) hours as needed for pain. Reported on 02/14/2016    . atorvastatin (LIPITOR) 80 MG tablet Take 80 mg by mouth daily.     . Blood Glucose Monitoring Suppl (AGAMATRIX PRESTO PRO METER) DEVI Twice daily blood glucose checks before meals 1 Device 0  . carvedilol (COREG) 6.25 MG tablet Take 1 tablet (6.25 mg total) by mouth 2 (two) times daily with a meal. 60 tablet 6  . digoxin (LANOXIN) 0.125 MG tablet Take 1 tablet (125 mcg total) by mouth daily. 30 tablet 6  . ferrous gluconate (FERGON) 324 MG tablet Take 1 tablet (324 mg total) by mouth daily with breakfast.  3  . furosemide (LASIX) 40 MG tablet Take 1 tablet (40 mg total) by mouth 2 (two) times daily. 60 tablet 11  . glipiZIDE (GLUCOTROL) 5 MG tablet Take 5 mg by mouth daily before breakfast.     . glucose blood (AGAMATRIX PRESTO TEST) test strip Twice daily glucose checks before meals 100 each 12  . losartan (COZAAR) 25 MG tablet Take 0.5 tablets (12.5 mg total) by mouth daily. 30 tablet 11  . potassium chloride (K-DUR) 10 MEQ tablet Take 20 mEq by mouth daily.    Marland Kitchen spironolactone (ALDACTONE) 25 MG tablet Take 1 tablet (25 mg total) by mouth daily. 30 tablet 11  . XARELTO 20 MG TABS tablet TAKE 1 TABLET BY MOUTH ONCE DAILY WITH SUPPER (Patient taking differently: Take 20 mg by mouth daily with supper. ) 30 tablet 11   No current facility-administered medications for this encounter.     Vitals:   07/21/18 1435  BP: 104/72  Pulse: 80  SpO2: 99%  Weight: 98.2 kg (216 lb 9.6 oz)   Wt Readings from Last 3 Encounters:  07/21/18 98.2 kg (216 lb 9.6 oz)  07/14/18 95.6 kg (210 lb 12.2 oz)  07/01/18 96.8 kg (213 lb  6.4 oz)   PHYSICAL EXAM: Spanish Interpreter present.  General:  Well appearing. No resp difficulty. Walked in the clinic.  HEENT: normal Neck: supple. no JVD. Carotids 2+ bilat; no bruits. No lymphadenopathy or thryomegaly appreciated. Cor: PMI nondisplaced. Regular rate & rhythm. No rubs, gallops or murmurs. L upper chest  steri strips in place with some ecchymosis.  Lungs: clear Abdomen: soft, nontender, nondistended. No hepatosplenomegaly. No bruits or masses. Good bowel sounds. Extremities: no cyanosis, clubbing, rash, edema Neuro: alert & orientedx3, cranial nerves grossly intact. moves all 4 extremities w/o difficulty. Affect pleasant  EKG -  V Paced 80 bpm  .   ASSESSMENT & PLAN: 1. Chronic Systolic Heart Failure, NICM. LHC 2018 50% LAD ECHO 06/2018 EF 15%  LA severely dilated. EF has been down for years.  07/13/2018 S/P St Jude CRT-P.  NYHA II.  Volume status stable. Continue lasix 40 mg twice a day.  -Continue carvedilol 6.25 mg twice a day + 0.125 mg digoxin daily.   -Continue 12.5 mg losartan daily -Continue 25 mg spironolactone daily.   -Check BMET and dig level.  2. LBBB S/P St jude  CRT-P. Site ok. Ok to shower. He has follow up next week at the device clinic.  3. Chronic A fib Rate controlled.  Continue xarelto 20 mg daily 4. CKD Stage III Check BMET today. 5. DMII    Follow up with Dr Haroldine Laws in January. Greater than 50% of the (total minutes 25) visit spent in counseling/coordination of care regarding device and heart failure.    Kyen Taite NP-C  3:03 PM

## 2018-07-22 ENCOUNTER — Encounter: Payer: Self-pay | Admitting: Internal Medicine

## 2018-07-22 ENCOUNTER — Ambulatory Visit: Payer: Self-pay | Admitting: Internal Medicine

## 2018-07-22 VITALS — BP 102/60 | HR 84 | Resp 12 | Ht 71.0 in | Wt 212.0 lb

## 2018-07-22 DIAGNOSIS — N183 Chronic kidney disease, stage 3 (moderate): Secondary | ICD-10-CM

## 2018-07-22 DIAGNOSIS — E1122 Type 2 diabetes mellitus with diabetic chronic kidney disease: Secondary | ICD-10-CM

## 2018-07-22 DIAGNOSIS — I482 Chronic atrial fibrillation, unspecified: Secondary | ICD-10-CM

## 2018-07-22 DIAGNOSIS — I5022 Chronic systolic (congestive) heart failure: Secondary | ICD-10-CM

## 2018-07-22 DIAGNOSIS — K4090 Unilateral inguinal hernia, without obstruction or gangrene, not specified as recurrent: Secondary | ICD-10-CM

## 2018-07-22 NOTE — Progress Notes (Signed)
Subjective:    Patient ID: Kyle Burke, male    DOB: Feb 15, 1946, 72 y.o.   MRN: [id number]  HPI   1.  Cardiomyopathy with afib:  Biventricular pacemaker placed 07/13/2018 with Dr. Tempie Hoist.  He states he already notes improved endurance with walking, energy is a bit better and no ankle edema.  Wife also notes his color is much improved.    Digoxin was added to his medication regiment Pacemaker pocket still with a bit of swelling and discomfort. Regarding potassium, it appears he was receiving in the hospital, but he does not have any at home.  His daughter states he has not been taking any potassium since before the pacemaker placement.  His K+ was fine at 4.7 yesterday without it.   Again, on Losartan and spironolactone as well.  2.  DM:  With his low cardiac output, stopped Metformin last visit.  Started Glipizide last visit at 5 mg twice daily. Sugars are generally running in 90s before meals.   Current Meds  Medication Sig  . acetaminophen (TYLENOL) 650 MG CR tablet Take 650 mg by mouth every 8 (eight) hours as needed for pain. Reported on 02/14/2016  . atorvastatin (LIPITOR) 80 MG tablet Take 80 mg by mouth daily.   . Blood Glucose Monitoring Suppl (AGAMATRIX PRESTO PRO METER) DEVI Twice daily blood glucose checks before meals  . carvedilol (COREG) 6.25 MG tablet Take 1 tablet (6.25 mg total) by mouth 2 (two) times daily with a meal.  . digoxin (LANOXIN) 0.125 MG tablet Take 1 tablet (125 mcg total) by mouth daily.  . ferrous gluconate (FERGON) 324 MG tablet Take 1 tablet (324 mg total) by mouth daily with breakfast.  . furosemide (LASIX) 40 MG tablet Take 1 tablet (40 mg total) by mouth 2 (two) times daily.  Marland Kitchen glipiZIDE (GLUCOTROL) 5 MG tablet Take 2.5 mg by mouth 2 (two) times daily before a meal.   . glucose blood (AGAMATRIX PRESTO TEST) test strip Twice daily glucose checks before meals  . losartan (COZAAR) 25 MG tablet Take 0.5 tablets (12.5 mg total) by mouth daily.    . potassium chloride (K-DUR) 10 MEQ tablet Take 20 mEq by mouth daily.  Marland Kitchen spironolactone (ALDACTONE) 25 MG tablet Take 1 tablet (25 mg total) by mouth daily.  Alveda Reasons 20 MG TABS tablet TAKE 1 TABLET BY MOUTH ONCE DAILY WITH SUPPER (Patient taking differently: Take 20 mg by mouth daily with supper. )       Review of Systems     Objective:   Physical Exam Looks great Great color Lungs:  CTA CV:  RRR without murmur or rub.  Radial and DP pulses normal and equal Abd:  Left groin:  Hernia much softer and smaller LE:  No ankle or leg edema    Assessment & Plan:  1.  Cardiomyopathy with chronic afib, now with biventricular pacemaker and already looks much better and symptomatically better as well. After discussion, clear he has not been taking potassium supplementation since his last visit and K+ 4.7 yesterday.   Will remove from med list. He has repeat BMP scheduled with Cardiology on the 11th.  2.  DM:  Well controlled.  To call if sugars drop into 70s and 80s frequently and will consider discontinuation of Glipizide.  Previously stopped Metformin with poor cardiac output and concern for renal compromise.  3.  Left Inguinal Hernia:  Surgery has evaluated.  Waiting until stable following pacemaker placement to repair.  Improved today--likely  as he has been lying down more.

## 2018-07-28 ENCOUNTER — Other Ambulatory Visit: Payer: No Typology Code available for payment source | Admitting: *Deleted

## 2018-07-28 ENCOUNTER — Ambulatory Visit (INDEPENDENT_AMBULATORY_CARE_PROVIDER_SITE_OTHER): Payer: No Typology Code available for payment source | Admitting: *Deleted

## 2018-07-28 DIAGNOSIS — I4821 Permanent atrial fibrillation: Secondary | ICD-10-CM

## 2018-07-28 DIAGNOSIS — I447 Left bundle-branch block, unspecified: Secondary | ICD-10-CM

## 2018-07-29 LAB — DIGOXIN LEVEL: Digoxin, Serum: 0.5 ng/mL (ref 0.5–0.9)

## 2018-07-30 NOTE — Progress Notes (Signed)
Wound check appointment. Large hematoma noted, GT assessed site and recommended pt hold Xarelto until apt on 08/04/2018. Steri-Strips remain in place per GT.  Normal device function. Thresholds, sensing, and impedances consistent with implant measurements. HIS lead in RA port. Per GT QRS width more narrow programmed DDDR 80 than HIS only pacing at AAI 90. Histogram distribution appropriate for patient and level of activity. No high ventricular rates noted. Patient educated about wound care, arm mobility, lifting restrictions. ROV w/ DC on 08/04/2018

## 2018-08-04 ENCOUNTER — Ambulatory Visit (INDEPENDENT_AMBULATORY_CARE_PROVIDER_SITE_OTHER): Payer: No Typology Code available for payment source | Admitting: *Deleted

## 2018-08-04 DIAGNOSIS — I4821 Permanent atrial fibrillation: Secondary | ICD-10-CM

## 2018-08-05 ENCOUNTER — Other Ambulatory Visit: Payer: Self-pay

## 2018-08-05 NOTE — Progress Notes (Signed)
Patient presents to the office for a wound recheck s/p CRT-P implant on 07/13/18. Mod/Large hematoma remains. Dark red ecchymosis noted over the pacemaker pocket. Steri strips remain in place. Patient/spouse states that patient had some mild bleeding from the pocket on 07/30/18, none since. Patient denies any fever/chills. Dr.Taylor assessed wound and recommended that patient continue to hold xarelto and f/u in 2 weeks. Dr.Taylor also recommended that steri strips remain in place until next ov. Dr.Taylor states that steri strips don't need to be reapplied if they fall out prior to next ov. Patient was instructed to call for any signs/sx's of infection. Patient was also instructed to call if hematoma becomes larger, tighter, or drains. Patient/family verbalized understanding.  All communication was done with the help of a language interpreter.

## 2018-08-13 ENCOUNTER — Other Ambulatory Visit: Payer: Self-pay

## 2018-08-13 ENCOUNTER — Emergency Department (HOSPITAL_COMMUNITY): Payer: Self-pay

## 2018-08-13 ENCOUNTER — Emergency Department (HOSPITAL_COMMUNITY)
Admission: EM | Admit: 2018-08-13 | Discharge: 2018-08-14 | Disposition: A | Discharge status: Home / Self Care | Payer: Self-pay | Attending: Emergency Medicine | Admitting: Emergency Medicine

## 2018-08-13 ENCOUNTER — Encounter (HOSPITAL_COMMUNITY): Payer: Self-pay

## 2018-08-13 DIAGNOSIS — N183 Chronic kidney disease, stage 3 (moderate): Secondary | ICD-10-CM | POA: Insufficient documentation

## 2018-08-13 DIAGNOSIS — T82837A Hemorrhage of cardiac prosthetic devices, implants and grafts, initial encounter: Secondary | ICD-10-CM

## 2018-08-13 DIAGNOSIS — D124 Benign neoplasm of descending colon: Secondary | ICD-10-CM | POA: Insufficient documentation

## 2018-08-13 DIAGNOSIS — D123 Benign neoplasm of transverse colon: Secondary | ICD-10-CM | POA: Insufficient documentation

## 2018-08-13 DIAGNOSIS — Z79899 Other long term (current) drug therapy: Secondary | ICD-10-CM | POA: Insufficient documentation

## 2018-08-13 DIAGNOSIS — I2510 Atherosclerotic heart disease of native coronary artery without angina pectoris: Secondary | ICD-10-CM | POA: Insufficient documentation

## 2018-08-13 DIAGNOSIS — Z950 Presence of cardiac pacemaker: Secondary | ICD-10-CM | POA: Insufficient documentation

## 2018-08-13 DIAGNOSIS — Y712 Prosthetic and other implants, materials and accessory cardiovascular devices associated with adverse incidents: Secondary | ICD-10-CM | POA: Insufficient documentation

## 2018-08-13 DIAGNOSIS — Z7984 Long term (current) use of oral hypoglycemic drugs: Secondary | ICD-10-CM | POA: Insufficient documentation

## 2018-08-13 DIAGNOSIS — Z7901 Long term (current) use of anticoagulants: Secondary | ICD-10-CM | POA: Insufficient documentation

## 2018-08-13 DIAGNOSIS — T829XXA Unspecified complication of cardiac and vascular prosthetic device, implant and graft, initial encounter: Secondary | ICD-10-CM

## 2018-08-13 DIAGNOSIS — I5022 Chronic systolic (congestive) heart failure: Secondary | ICD-10-CM | POA: Insufficient documentation

## 2018-08-13 DIAGNOSIS — E1122 Type 2 diabetes mellitus with diabetic chronic kidney disease: Secondary | ICD-10-CM | POA: Insufficient documentation

## 2018-08-13 DIAGNOSIS — T82897A Other specified complication of cardiac prosthetic devices, implants and grafts, initial encounter: Secondary | ICD-10-CM | POA: Insufficient documentation

## 2018-08-13 DIAGNOSIS — I130 Hypertensive heart and chronic kidney disease with heart failure and stage 1 through stage 4 chronic kidney disease, or unspecified chronic kidney disease: Secondary | ICD-10-CM | POA: Insufficient documentation

## 2018-08-13 LAB — CBC
HCT: 36.7 % — ABNORMAL LOW (ref 39.0–52.0)
Hemoglobin: 11.5 g/dL — ABNORMAL LOW (ref 13.0–17.0)
MCH: 29.6 pg (ref 26.0–34.0)
MCHC: 31.3 g/dL (ref 30.0–36.0)
MCV: 94.3 fL (ref 80.0–100.0)
Platelets: 134 10*3/uL — ABNORMAL LOW (ref 150–400)
RBC: 3.89 MIL/uL — ABNORMAL LOW (ref 4.22–5.81)
RDW: 14.8 % (ref 11.5–15.5)
WBC: 4.1 10*3/uL (ref 4.0–10.5)
nRBC: 0 % (ref 0.0–0.2)

## 2018-08-13 LAB — BASIC METABOLIC PANEL
Anion gap: 10 (ref 5–15)
BUN: 43 mg/dL — AB (ref 8–23)
CO2: 25 mmol/L (ref 22–32)
Calcium: 9.4 mg/dL (ref 8.9–10.3)
Chloride: 103 mmol/L (ref 98–111)
Creatinine, Ser: 1.23 mg/dL (ref 0.61–1.24)
GFR calc Af Amer: >60 mL/min (ref >60)
GFR, EST NON AFRICAN AMERICAN: 59 mL/min — AB (ref >60)
Glucose, Bld: 98 mg/dL (ref 70–99)
Potassium: 4.5 mmol/L (ref 3.5–5.1)
SODIUM: 138 mmol/L (ref 135–145)

## 2018-08-13 MED ORDER — CEPHALEXIN 500 MG PO CAPS: 500.0000 mg | ORAL_CAPSULE | Freq: Two times a day (BID) | ORAL | 0 refills | Status: AC

## 2018-08-13 NOTE — ED Triage Notes (Signed)
Pt here after having pacemaker placed on Nov 26th 2019.  Pt states bleeding from site started 2 hrs PTA.  Hematoma to the left chest noted.  No Pain per patient.  A&Ox4.  Speaks primarily [place].

## 2018-08-13 NOTE — ED Notes (Addendum)
This EMT was alerted by family that the site had bled through the existing dressing. Guaze on site noted to be saturated and dried blood noted on the under arm area. Blood was noted to be dark in nature and clotted. More gauze added to site and pt informed he would hopefully be roomed to see provider soon.   Claiborne Billings, RN notified.

## 2018-08-13 NOTE — Consult Note (Addendum)
Cardiology Consultation:   Patient ID: Kyle Burke MRN: [id number]; DOB: [birth year]  Admit date: 08/13/2018 Date of Consult: 08/13/2018  Primary Care Provider: Mack Hook, MD Primary Cardiologist: Dr. Gari Crown Primary Electrophysiologist:  Dr. Lovena Le   Patient Profile:   Kyle Burke is a 72 y.o. male with a hx of NICM, chronic CHF (systolic), DM, HTN, LBBB, and permanent AFib who is being seen today for the evaluation of PPM site at the request of Dr. Ellender Hose.  (The patient was seen by EP initially for his severe NICM (EF15%) in November, he did not have classic LBBB, his QRS is very wide with IVCD and there was felt to be potential he would benefit from CRT therapy with HIS/LV/RV lead.  With likely poor long term prognosis, did not feel appropriate for ICD)  History of Present Illness:   Kyle Burke is s/p implant of CRT-P 07/13/18 with NICM, LBBB.  He is on xarelto for permanent AFib, this was held peri/post procedure as usual, discharge summary does not note any site concerns  He was seen for his wound check visit on 12/18 noted to have a mod-large hematoma, with reports by the patient/family that it had some mild bleeding from the pocket 12/13, but none further.  There were no reports of fever/chills, symptom of infection.  He was evaluated by Dr. Lovena Le at that visit with instructions to hold xarelto for 2 weeks with a f/u visit/site check in place for 08/19/17.   Visit today is with Spanish translation via [place], [place], [phone] The patient feels well, no complaints or concerns outside of his device site.  He denies site pain.  He was told should he have any recurrent bleeding from his site to get checked so he came in.  Reports last evening noted small amount of blood at the wound again, seemed to be a slow persistent ooz, and came in with concerns of this.  He denies any fever or symptoms of illness.  Labs K+ 4.5 BUN/Creat 43/1.23 WBC  4.1 H/H 11.5/36.7 plts 134  VSS, afebrile  Device information: SJM CRT-P, implanted 07/13/18 Saint Jude 58 cm active-fixation pacing lead, serial number [id number] ([id number]) Saint Jude left ventricular pacing lead, serial number [id number] (LV) Medtronic model 3830 His bundle pacing lead, serial number [id number] (HIS)  Past Medical History:  Diagnosis Date  . Arthritis    "knees" (07/13/2018)  . Atrial fibrillation ([place]) 2016  . CAD (coronary artery disease)   . High cholesterol   . Hypertension 2006  . Idiopathic cardiomyopathy ([place]) 11/01/2014   Followed by Dr. Acie Fredrickson, Cardiology  EF 25%  Adenosine Cardiolite did not support ischemic etiology  . OA (osteoarthritis) of knee 01/08/2017  . Pleural effusion   . Presence of permanent cardiac pacemaker 07/13/2018  . Refusal of blood transfusions as patient is Jehovah's Witness   . Type II diabetes mellitus ([place]) 2001    Past Surgical History:  Procedure Laterality Date  . BI-VENTRICULAR PACEMAKER INSERTION (CRT-P)  07/13/2018  . BIV PACEMAKER INSERTION CRT-P N/A 07/13/2018   Procedure: BIV PACEMAKER INSERTION CRT-P;  Surgeon: Evans Lance, MD;  Location: [facility];  Service: Cardiovascular;  Laterality: N/A;  . COLONOSCOPY WITH PROPOFOL N/A 05/14/2016   Procedure: COLONOSCOPY WITH PROPOFOL;  Surgeon: Jerene Bears, MD;  Location: [facility];  Service: Gastroenterology;  Laterality: N/A;  . ESOPHAGOGASTRODUODENOSCOPY (EGD) WITH PROPOFOL N/A 05/14/2016   Procedure: ESOPHAGOGASTRODUODENOSCOPY (EGD) WITH PROPOFOL;  Surgeon: Jerene Bears, MD;  Location:  [facility];  Service: Gastroenterology;  Laterality: N/A;  . HERNIA REPAIR    . INCISE AND DRAIN ABCESS Left 1986   on leg  . INGUINAL HERNIA REPAIR  01/16/2012   Procedure: LAPAROSCOPIC INGUINAL HERNIA;  Surgeon: Gayland Curry, MD,FACS;  Location: [facility];  Service: General;  Laterality: Left;  Laparoscopic incarcerated Left inguinal hernia repair with mesh, umbilical hernia  repair  . UMBILICAL HERNIA REPAIR  01/16/2012   Procedure: HERNIA REPAIR UMBILICAL ADULT;  Surgeon: Gayland Curry, MD,FACS;  Location: [facility];  Service: General;  Laterality: N/A;     Home Medications:  Prior to Admission medications   Medication Sig Start Date End Date Taking? Authorizing Provider  acetaminophen (TYLENOL) 650 MG CR tablet Take 650 mg by mouth every 8 (eight) hours as needed for pain. Reported on 02/14/2016    [provider]  atorvastatin (LIPITOR) 80 MG tablet Take 80 mg by mouth daily.  08/13/16   [provider]  Blood Glucose Monitoring Suppl (AGAMATRIX PRESTO PRO METER) DEVI Twice daily blood glucose checks before meals 08/15/16   Mack Hook, MD  carvedilol (COREG) 6.25 MG tablet Take 1 tablet (6.25 mg total) by mouth 2 (two) times daily with a meal. 07/14/18   Baldwin Jamaica, PA-C  digoxin (LANOXIN) 0.125 MG tablet Take 1 tablet (125 mcg total) by mouth daily. 07/14/18 02/09/19  Baldwin Jamaica, PA-C  ferrous gluconate (FERGON) 324 MG tablet Take 1 tablet (324 mg total) by mouth daily with breakfast. 02/09/17   Mack Hook, MD  furosemide (LASIX) 40 MG tablet Take 1 tablet (40 mg total) by mouth 2 (two) times daily. 06/25/18   Mack Hook, MD  glipiZIDE (GLUCOTROL) 5 MG tablet Take 2.5 mg by mouth 2 (two) times daily before a meal.     [provider]  glucose blood (AGAMATRIX PRESTO TEST) test strip Twice daily glucose checks before meals 08/15/16   Mack Hook, MD  losartan (COZAAR) 25 MG tablet Take 0.5 tablets (12.5 mg total) by mouth daily. 06/25/18   Mack Hook, MD  spironolactone (ALDACTONE) 25 MG tablet Take 1 tablet (25 mg total) by mouth daily. 06/25/18   Mack Hook, MD  XARELTO 20 MG TABS tablet TAKE 1 TABLET BY MOUTH ONCE DAILY WITH SUPPER Patient not taking: No sig reported 10/28/17   Mack Hook, MD    Inpatient Medications: Scheduled Meds:  Continuous Infusions:  PRN  Meds:   Allergies:   No Known Allergies  Social History:   Social History   Socioeconomic History  . Marital status: Married    Spouse name: Not on file  . Number of children: 5  . Years of education: 9  . Highest education level: Not on file  Occupational History  . Occupation: Ambulance person constrution    Comment: Barista  Social Needs  . Financial resource strain: Not on file  . Food insecurity:    Worry: Not on file    Inability: Not on file  . Transportation needs:    Medical: Not on file    Non-medical: Not on file  Tobacco Use  . Smoking status: Never Smoker  . Smokeless tobacco: Never Used  Substance and Sexual Activity  . Alcohol use: Yes    Alcohol/week: 0.0 standard drinks    Comment: 07/13/2018 "used to drink; none since 2015"  . Drug use: Never  . Sexual activity: Yes  Lifestyle  . Physical activity:    Days per week: Not on file  Minutes per session: Not on file  . Stress: Not on file  Relationships  . Social connections:    Talks on phone: Not on file    Gets together: Not on file    Attends religious service: Not on file    Active member of club or organization: Not on file    Attends meetings of clubs or organizations: Not on file    Relationship status: Not on file  . Intimate partner violence:    Fear of current or ex partner: Not on file    Emotionally abused: Not on file    Physically abused: Not on file    Forced sexual activity: Not on file  Other Topics Concern  . Not on file  Social History Narrative   Originally from [place]   Came to [place] In 1995   Lives at home with wife, a son and his wife and 3 grandchildren.    Family History:   Family History  Problem Relation Age of Onset  . Stroke Mother   . Heart disease Mother   . Heart disease Father   . Diabetes Father   . Kidney disease Father        , history of stones as well  . Diabetes Brother   . Cancer Sister 61       unknown soft tissue cancer of leg     ROS:   Please see the history of present illness.  All other ROS reviewed and negative.     Physical Exam/Data:   Vitals:   08/13/18 0515 08/13/18 0530 08/13/18 0545 08/13/18 0600  BP: 113/63 112/62 107/63 119/65  Pulse: 81 81 81 80  Resp: 15 14 15 12   Temp:      TempSrc:      SpO2: 99% 99% 98% 100%   No intake or output data in the 24 hours ending 08/13/18 0843 There were no vitals filed for this visit. There is no height or weight on file to calculate BMI.  General:  Well nourished, well developed, in no acute distress HEENT: normal Lymph: no adenopathy Neck: no JVD Endocrine:  No thryomegaly Vascular: No carotid bruits Cardiac:  RRR; no murmurs, gallops or rubs Lungs:  CTA b/l, no wheezing, rhonchi or rales  Abd: soft, nontender  Ext: no edema Musculoskeletal:  No deformities, age appropriate atrophy Skin: warm and dry  Neuro:  no focal abnormalities noted Psych:  Normal affect   L chest/PPM site: Very small opening at the suture line, near the middle Very dark blood with a slight/slow oozing Pressure held, and able to evacuate more of the hematoma. Is very soft, non-tender, no erythema or heat to the tissues.  EKG:  The EKG was personally reviewed and demonstrates:  No EKGs Telemetry:  Telemetry was personally reviewed and demonstrates:   Paced rhythm  Relevant CV Studies:  Echo 06/2018 Compared to a prior echo in 2017, the LVEF is further decreased to 15% with severe global hypokinesis, inferior akinesis and septal dyskinesis.RV mildly reduced.   ECHO 2017 [facility] EF 20-25% RV mildly dilated. Severe hypokinesis.  RHC/ LHC [facility] 2018 at [place] 50%,mild om2.  RA 12 PCWP 31  Laboratory Data:  Chemistry Recent Labs  Lab 08/13/18 0236  NA 138  K 4.5  CL 103  CO2 25  GLUCOSE 98  BUN 43*  CREATININE 1.23  CALCIUM 9.4  GFRNONAA 59*  GFRAA >60  ANIONGAP 10    No results for input(s): PROT, ALBUMIN, AST, ALT,  ALKPHOS, BILITOT in the last  168 hours. Hematology Recent Labs  Lab 08/13/18 0236  WBC 4.1  RBC 3.89*  HGB 11.5*  HCT 36.7*  MCV 94.3  MCH 29.6  MCHC 31.3  RDW 14.8  PLT 134*   Cardiac EnzymesNo results for input(s): TROPONINI in the last 168 hours. No results for input(s): TROPIPOC in the last 168 hours.  BNPNo results for input(s): BNP, PROBNP in the last 168 hours.  DDimer No results for input(s): DDIMER in the last 168 hours.  Radiology/Studies:  Dg Chest 2 View Result Date: 08/13/2018 CLINICAL DATA:  Bleeding from the pacemaker insertion site in the LEFT UPPER chest wall, associated with pain. EXAM: CHEST - 2 VIEW COMPARISON:  07/14/2018 and earlier. FINDINGS: LEFT subclavian biventricular pacemaker unchanged and appears intact. Cardiac silhouette markedly enlarged, unchanged. Thoracic aorta atherosclerotic, unchanged. Hilar and mediastinal contours otherwise unremarkable. Minimal atelectasis in the LEFT LOWER LOBE. Lungs otherwise clear. Bronchovascular markings normal. No localized airspace consolidation. No pleural effusions. No pneumothorax. Normal pulmonary vascularity. No pleural effusions. Degenerative changes involving the thoracic and UPPER lumbar spine. IMPRESSION: Stable marked cardiomegaly. Minimal atelectasis in the LEFT LOWER LOBE. No acute cardiopulmonary disease otherwise. Electronically Signed   By: Evangeline Dakin M.D.   On: 08/13/2018 07:32    Assessment and Plan:   1. CRT-P implanted 07/13/18     Wound check visit 08/04/18 note mod-large hematoma (with reports of a few days prior of small bleeding     He has been off his xarelto since that visit     oozing of old blood/hematoma via small opening at the suture line      I anticipate home with antibiotics to follow up at his already scheduled visit next week 08/19/17 (Dr. Lovena Le will be in office that day as well)   Dr. Caryl Comes has seen the patient.  Discussed concerns for infection risk and importance of antibiotics and keeping f/u nest  week.  Also discussed signs of infection, fever to return. Pressure dressing was applied, tolerated well, instructed to keep in place until his visit next week  Keflex 500mg  BID for 7 days has been sent in electronically to his pharmacy on file, confirmed by the patient to be correct and known to be open until 5pm, he will get it picked up and started tonight OK to discharge from ER from our perspective       For questions or updates, please contact [place] Please consult [email or web address] for contact info under     Signed, Baldwin Jamaica, PA-C  08/13/2018 8:43 AM   Spoke with the patient and his family via [place] interpreters.  No significant symptoms.  Advised of the concern regarding infection with the hematoma as well as the egress of blood.  We will begin him on antibiotics and use compressive dressing.  He is to see Dr. Lovena Le next week.  They are advised to return to the hospital for fevers or chills.

## 2018-08-13 NOTE — ED Provider Notes (Signed)
11:38 AM Assumed care from Dr. Ellender Hose, please see their note for full history, physical and decision making until this point. In brief this is a 72 y.o. year old male who presented to the ED tonight with Post-op Problem      Here with hematoma around pacemaker site. Cardiology evaluated. Drained. Started antibiotics. Follow up next week. reeval appears well, non-septic. Stable for discharge.   Labs, studies and imaging reviewed by myself and considered in medical decision making if ordered. Imaging interpreted by radiology.  Labs Reviewed  CBC - Abnormal; Notable for the following components:      Result Value   RBC 3.89 (*)    Hemoglobin 11.5 (*)    HCT 36.7 (*)    Platelets 134 (*)    All other components within normal limits  BASIC METABOLIC PANEL - Abnormal; Notable for the following components:   BUN 43 (*)    GFR calc non Af Amer 59 (*)    All other components within normal limits    DG Chest 2 View  Final Result      No follow-ups on file.    Merrily Pew, MD 08/13/18 1141

## 2018-08-13 NOTE — ED Notes (Signed)
Cardiology at bedside.

## 2018-08-13 NOTE — ED Notes (Signed)
Limited english speaking

## 2018-08-13 NOTE — ED Provider Notes (Signed)
[facility] EMERGENCY DEPARTMENT Provider Note   CSN: [id number] Arrival date & time: 08/13/18  0123     History   Chief Complaint Chief Complaint  Patient presents with  . Post-op Problem    HPI Kyle Burke is a 72 y.o. male.  HPI   72 yo M with PMHx as below here w/ bleeding from PM site. Pt is status post recent pacemaker placement. He has been recovering well but states that over the past 24 hours, he has had mild swelling of his L upper chest wall. He has had bruising. Just prior to arrival, the area began bleeding a small volume of dar red blood. He was unable to control this with pressure so is here for evaluation. No other complaints. Np CP, SOB, palpitations. Denies any trauma to the area.  Past Medical History:  Diagnosis Date  . Arthritis    "knees" (07/13/2018)  . Atrial fibrillation ([place]) 2016  . CAD (coronary artery disease)   . High cholesterol   . Hypertension 2006  . Idiopathic cardiomyopathy ([place]) 11/01/2014   Followed by Dr. Acie Fredrickson, Cardiology  EF 25%  Adenosine Cardiolite did not support ischemic etiology  . OA (osteoarthritis) of knee 01/08/2017  . Pleural effusion   . Presence of permanent cardiac pacemaker 07/13/2018  . Refusal of blood transfusions as patient is Jehovah's Witness   . Type II diabetes mellitus ([place]) 2001    Patient Active Problem List   Diagnosis Date Noted  . Pacemaker 07/13/2018  . Decreased pulses in feet 11/10/2017  . OA (osteoarthritis) of knee 01/08/2017  . Mild CAD 08/12/2016  . Benign neoplasm of descending colon   . Benign neoplasm of transverse colon   . Heme + stool   . Gastritis and gastroduodenitis   . Acute on chronic systolic CHF (congestive heart failure) ([place]) 02/09/2016  . Essential hypertension 06/15/2015  . Normocytic anemia 04/07/2015  . Controlled type 2 diabetes mellitus with stage 3 chronic kidney disease, without long-term current use of insulin ([place])   . Dyslipidemia   .  Microalbuminuria due to type 2 diabetes mellitus ([place])   . Chronic systolic CHF (congestive heart failure) ([place]) 12/15/2014  . Atrial fibrillation ([place]) 09/06/2014  . HTN (hypertension) 09/06/2014  . LBBB (left bundle branch block) 09/06/2014    Past Surgical History:  Procedure Laterality Date  . BI-VENTRICULAR PACEMAKER INSERTION (CRT-P)  07/13/2018  . BIV PACEMAKER INSERTION CRT-P N/A 07/13/2018   Procedure: BIV PACEMAKER INSERTION CRT-P;  Surgeon: Evans Lance, MD;  Location: [facility];  Service: Cardiovascular;  Laterality: N/A;  . COLONOSCOPY WITH PROPOFOL N/A 05/14/2016   Procedure: COLONOSCOPY WITH PROPOFOL;  Surgeon: Jerene Bears, MD;  [facility];  Service: Gastroenterology;  Laterality: N/A;  . ESOPHAGOGASTRODUODENOSCOPY (EGD) WITH PROPOFOL N/A 05/14/2016   Procedure: ESOPHAGOGASTRODUODENOSCOPY (EGD) WITH PROPOFOL;  Surgeon: Jerene Bears, MD;  [facility];  Service: Gastroenterology;  Laterality: N/A;  . HERNIA REPAIR    . INCISE AND DRAIN ABCESS Left 1986   on leg  . INGUINAL HERNIA REPAIR  01/16/2012   Procedure: LAPAROSCOPIC INGUINAL HERNIA;  Surgeon: Gayland Curry, MD,FACS;  Location[facility];  Service: General;  Laterality: Left;  Laparoscopic incarcerated Left inguinal hernia repair with mesh, umbilical hernia repair  . UMBILICAL HERNIA REPAIR  01/16/2012   Procedure: HERNIA REPAIR UMBILICAL ADULT;  Surgeon: Gayland Curry, MD,FACS;  Location: [facility];  Service: General;  Laterality: N/A;  Home Medications    Prior to Admission medications   Medication Sig Start Date End Date Taking? Authorizing Provider  acetaminophen (TYLENOL) 650 MG CR tablet Take 650 mg by mouth every 8 (eight) hours as needed for pain. Reported on 02/14/2016    [provider]  atorvastatin (LIPITOR) 80 MG tablet Take 80 mg by mouth daily.  08/13/16   [provider]  Blood Glucose Monitoring Suppl (AGAMATRIX PRESTO PRO METER) DEVI Twice daily  blood glucose checks before meals 08/15/16   Mack Hook, MD  carvedilol (COREG) 6.25 MG tablet Take 1 tablet (6.25 mg total) by mouth 2 (two) times daily with a meal. 07/14/18   Baldwin Jamaica, PA-C  digoxin (LANOXIN) 0.125 MG tablet Take 1 tablet (125 mcg total) by mouth daily. 07/14/18 02/09/19  Baldwin Jamaica, PA-C  ferrous gluconate (FERGON) 324 MG tablet Take 1 tablet (324 mg total) by mouth daily with breakfast. 02/09/17   Mack Hook, MD  furosemide (LASIX) 40 MG tablet Take 1 tablet (40 mg total) by mouth 2 (two) times daily. 06/25/18   Mack Hook, MD  glipiZIDE (GLUCOTROL) 5 MG tablet Take 2.5 mg by mouth 2 (two) times daily before a meal.     [provider]  glucose blood (AGAMATRIX PRESTO TEST) test strip Twice daily glucose checks before meals 08/15/16   Mack Hook, MD  losartan (COZAAR) 25 MG tablet Take 0.5 tablets (12.5 mg total) by mouth daily. 06/25/18   Mack Hook, MD  spironolactone (ALDACTONE) 25 MG tablet Take 1 tablet (25 mg total) by mouth daily. 06/25/18   Mack Hook, MD  XARELTO 20 MG TABS tablet TAKE 1 TABLET BY MOUTH ONCE DAILY WITH SUPPER Patient not taking: No sig reported 10/28/17   Mack Hook, MD    Family History Family History  Problem Relation Age of Onset  . Stroke Mother   . Heart disease Mother   . Heart disease Father   . Diabetes Father   . Kidney disease Father        , history of stones as well  . Diabetes Brother   . Cancer Sister 65       unknown soft tissue cancer of leg    Social History Social History   Tobacco Use  . Smoking status: Never Smoker  . Smokeless tobacco: Never Used  Substance Use Topics  . Alcohol use: Yes    Alcohol/week: 0.0 standard drinks    Comment: 07/13/2018 "used to drink; none since 2015"  . Drug use: Never     Allergies   Patient has no known allergies.   Review of Systems Review of Systems  Constitutional: Negative for chills and  fever.  Respiratory: Negative for shortness of breath.   Cardiovascular: Negative for chest pain.  Musculoskeletal: Negative for neck pain.  Skin: Positive for wound. Negative for rash.  Allergic/Immunologic: Negative for immunocompromised state.  Neurological: Negative for weakness and numbness.  Hematological: Does not bruise/bleed easily.  All other systems reviewed and are negative.    Physical Exam Updated Vital Signs BP 119/65   Pulse 80   Temp 98.2 F (36.8 C) (Oral)   Resp 12   SpO2 100%   Physical Exam Vitals signs and nursing note reviewed.  Constitutional:      General: He is not in acute distress.    Appearance: He is well-developed.  HENT:     Head: Normocephalic and atraumatic.  Eyes:     Conjunctiva/sclera: Conjunctivae normal.  Neck:  Musculoskeletal: Neck supple.  Cardiovascular:     Rate and Rhythm: Normal rate and regular rhythm.     Heart sounds: Normal heart sounds. No murmur. No friction rub.  Pulmonary:     Effort: Pulmonary effort is normal. No respiratory distress.     Breath sounds: Normal breath sounds. No wheezing or rales.  Abdominal:     General: There is no distension.     Palpations: Abdomen is soft.     Tenderness: There is no abdominal tenderness.  Skin:    General: Skin is warm.     Capillary Refill: Capillary refill takes less than 2 seconds.  Neurological:     Mental Status: He is alert and oriented to person, place, and time.     Motor: No abnormal muscle tone.     Left PM site with small, soft hematoma palpable overlying pocket. Along the lateral aspect of the wound, there is a punctate, superficial area of skin breakdown with moderate volume dark red venous oozing. No surrounding erythema       ED Treatments / Results  Labs (all labs ordered are listed, but only abnormal results are displayed) Labs Reviewed  CBC - Abnormal; Notable for the following components:      Result Value   RBC 3.89 (*)    Hemoglobin  11.5 (*)    HCT 36.7 (*)    Platelets 134 (*)    All other components within normal limits  BASIC METABOLIC PANEL - Abnormal; Notable for the following components:   BUN 43 (*)    GFR calc non Af Amer 59 (*)    All other components within normal limits    EKG None  Radiology No results found.  Procedures Procedures (including critical care time)  Medications Ordered in ED Medications - No data to display   Initial Impression / Assessment and Plan / ED Course  I have reviewed the triage vital signs and the nursing notes.  Pertinent labs & imaging results that were available during my care of the patient were reviewed by me and considered in my medical decision making (see chart for details).     72 yo M here with dark red venous bleeding from recent PM placement site. Suspect small pocket hematoma with skin breakdown and subsequent bleeding. He is HDS. No signs of infection. D/w Dr. Debara Pickett - will have EP see in AM.  Final Clinical Impressions(s) / ED Diagnoses   Final diagnoses:  Disorder of cardiac pacemaker system, initial encounter      Duffy Bruce, MD 08/13/18 [phone]

## 2018-08-19 ENCOUNTER — Ambulatory Visit (INDEPENDENT_AMBULATORY_CARE_PROVIDER_SITE_OTHER): Payer: No Typology Code available for payment source | Admitting: Nurse Practitioner

## 2018-08-19 ENCOUNTER — Encounter: Payer: Self-pay | Admitting: Nurse Practitioner

## 2018-08-19 VITALS — BP 112/70 | HR 88 | Ht 71.0 in

## 2018-08-19 DIAGNOSIS — I5022 Chronic systolic (congestive) heart failure: Secondary | ICD-10-CM

## 2018-08-19 MED ORDER — RIVAROXABAN 20 MG PO TABS: 20.0000 mg | ORAL_TABLET | Freq: Every day | ORAL | 1 refills | Status: DC

## 2018-08-19 NOTE — Progress Notes (Signed)
Wound check in clinic. Pressure dressing removed. Stitch noted and removed. No further bleeding. Dr Lovena Le evaluated as well.  Plan - continue antibiotics until completed. Resume She[facility] Oats on Monday   Chanetta Marshall, NP 08/19/2018 6:16 PM

## 2018-08-19 NOTE — Patient Instructions (Addendum)
Medication Instructions:  Finish Antibiotic  RESTART XARELTO 20 mg daily ON 08/23/2018 If you need a refill on your cardiac medications before your next appointment, please call your pharmacy.   Lab work: NONE If you have labs (blood work) drawn today and your tests are completely normal, you will receive your results only by: Marland Kitchen MyChart Message (if you have MyChart) OR . A paper copy in the mail If you have any lab test that is abnormal or we need to change your treatment, we will call you to review the results.  Testing/Procedures: NONE  Follow-Up: At [facility], you and your health needs are our priority.  As part of our continuing mission to provide you with exceptional heart care, we have created designated Provider Care Teams.  These Care Teams include your primary Cardiologist (physician) and Advanced Practice Providers (APPs -  Physician Assistants and Nurse Practitioners) who all work together to provide you with the care you need, when you need it. .   Any Other Special Instructions Will Be Listed Below (If Applicable).

## 2018-08-25 ENCOUNTER — Other Ambulatory Visit: Payer: Self-pay

## 2018-08-25 ENCOUNTER — Encounter (HOSPITAL_COMMUNITY): Payer: Self-pay | Admitting: Internal Medicine

## 2018-08-25 ENCOUNTER — Ambulatory Visit (HOSPITAL_COMMUNITY)
Admission: RE | Admit: 2018-08-25 | Discharge: 2018-08-25 | Disposition: A | Discharge status: Home / Self Care | Payer: No Typology Code available for payment source | Source: Ambulatory Visit | Attending: Internal Medicine | Admitting: Internal Medicine

## 2018-08-25 VITALS — BP 106/68 | HR 88 | Wt 223.0 lb

## 2018-08-25 DIAGNOSIS — E7800 Pure hypercholesterolemia, unspecified: Secondary | ICD-10-CM | POA: Insufficient documentation

## 2018-08-25 DIAGNOSIS — I2510 Atherosclerotic heart disease of native coronary artery without angina pectoris: Secondary | ICD-10-CM | POA: Insufficient documentation

## 2018-08-25 DIAGNOSIS — I5022 Chronic systolic (congestive) heart failure: Secondary | ICD-10-CM

## 2018-08-25 DIAGNOSIS — I130 Hypertensive heart and chronic kidney disease with heart failure and stage 1 through stage 4 chronic kidney disease, or unspecified chronic kidney disease: Secondary | ICD-10-CM | POA: Insufficient documentation

## 2018-08-25 DIAGNOSIS — N183 Chronic kidney disease, stage 3 unspecified: Secondary | ICD-10-CM

## 2018-08-25 DIAGNOSIS — Z79899 Other long term (current) drug therapy: Secondary | ICD-10-CM | POA: Insufficient documentation

## 2018-08-25 DIAGNOSIS — E1122 Type 2 diabetes mellitus with diabetic chronic kidney disease: Secondary | ICD-10-CM | POA: Insufficient documentation

## 2018-08-25 DIAGNOSIS — I447 Left bundle-branch block, unspecified: Secondary | ICD-10-CM

## 2018-08-25 DIAGNOSIS — Z833 Family history of diabetes mellitus: Secondary | ICD-10-CM | POA: Insufficient documentation

## 2018-08-25 DIAGNOSIS — I482 Chronic atrial fibrillation, unspecified: Secondary | ICD-10-CM | POA: Insufficient documentation

## 2018-08-25 DIAGNOSIS — Z7901 Long term (current) use of anticoagulants: Secondary | ICD-10-CM | POA: Insufficient documentation

## 2018-08-25 DIAGNOSIS — I428 Other cardiomyopathies: Secondary | ICD-10-CM | POA: Insufficient documentation

## 2018-08-25 DIAGNOSIS — Z950 Presence of cardiac pacemaker: Secondary | ICD-10-CM | POA: Insufficient documentation

## 2018-08-25 DIAGNOSIS — Z7984 Long term (current) use of oral hypoglycemic drugs: Secondary | ICD-10-CM | POA: Insufficient documentation

## 2018-08-25 DIAGNOSIS — N62 Hypertrophy of breast: Secondary | ICD-10-CM | POA: Insufficient documentation

## 2018-08-25 DIAGNOSIS — Z8249 Family history of ischemic heart disease and other diseases of the circulatory system: Secondary | ICD-10-CM | POA: Insufficient documentation

## 2018-08-25 DIAGNOSIS — I4821 Permanent atrial fibrillation: Secondary | ICD-10-CM

## 2018-08-25 MED ORDER — EPLERENONE 25 MG PO TABS: 25.0000 mg | ORAL_TABLET | Freq: Every day | ORAL | 3 refills | Status: DC

## 2018-08-25 NOTE — Patient Instructions (Signed)
Labs will need to be done in 2 weeks  START Inspra 25mg  (1 tab) daily  STOP Spironolactone   Follow up with Dr. Dannielle Burn in 2 months

## 2018-08-25 NOTE — Progress Notes (Signed)
PCP: Dr Amil Amen  Cardiologist: Dr Philbert Riser Primary HF Cardiologist: Dr Vaughan Browner  Spanish Speaking EP : Dr Lovena Le.    HPI: Kyle Burke is a 73 y.o. male [place] speaking male with a history of  chronic systolic heart failure, NICM,  LBBB,  DMII, chroinc A fib, and chronic anticoagulation with xarelto. He does not have medical insurance. He is not a [place] citizen. He requires assistance with medications.  He has been followed by Dr Philbert Riser at [facility]. He was last seen 01/2018. He was stable at that time. Weight was 220 pounds.  Admitted with A/C systolic heart failure. Diuresed with IV lasix and once diuresed transitioned to lasix 40 mg twice a day. EP consulted for LBBB. He is being considered for charitable CRT-D. Discharge weight was 223 pounds.   Admitted 07/13/18 for scheduled CRT-P. He was discharged on 07/14/18 on higher dose of carvedilol and digoxin was started.    Seen in ED 08/13/18 with hematoma around his CRT device. Completed course of Keflex and followed up in ICD device clinic 08/19/2018.   Interpreter present He presents today for follow up.  Feeling good overall. Only complaint is soreness in his nipples, for about 2 weeks on spironolactone. He can walk further now without getting tired since ICD placed. Denies SOB/PND/Orthopnea. Denies bleeding on Eliquis. No fever. Taking all medications as directed. Lives with daughter and wife.   ICD interrogation: BiV pacing > 99%, Thoracic impedence above threshold. Pt active for 1-2 hours daily. Personally reviewed.   Cardiac Testing  Echo 06/2018  Compared to a prior echo in 2017, the LVEF is further decreased to 15% with severe global hypokinesis, inferior akinesis and septal dyskinesis.RV mildly reduced.   ECHO 2017 [place] EF 20-25% RV mildly dilated. Severe hypokinesis.  RHC/ LHC [facility] 2018 at [facility] Nonobs LAD 50%,mild om2.  RA 12 PCWP 31  Review of systems complete and found to be negative unless listed in  HPI.    SH:  Social History   Socioeconomic History  . Marital status: Married    Spouse name: Not on file  . Number of children: 5  . Years of education: 9  . Highest education level: Not on file  Occupational History  . Occupation: Ambulance person constrution    Comment: Barista  Social Needs  . Financial resource strain: Not on file  . Food insecurity:    Worry: Not on file    Inability: Not on file  . Transportation needs:    Medical: Not on file    Non-medical: Not on file  Tobacco Use  . Smoking status: Never Smoker  . Smokeless tobacco: Never Used  Substance and Sexual Activity  . Alcohol use: Yes    Alcohol/week: 0.0 standard drinks    Comment: 07/13/2018 "used to drink; none since 2015"  . Drug use: Never  . Sexual activity: Yes  Lifestyle  . Physical activity:    Days per week: Not on file    Minutes per session: Not on file  . Stress: Not on file  Relationships  . Social connections:    Talks on phone: Not on file    Gets together: Not on file    Attends religious service: Not on file    Active member of club or organization: Not on file    Attends meetings of clubs or organizations: Not on file    Relationship status: Not on file  . Intimate partner violence:    Fear of current or ex partner:  Not on file    Emotionally abused: Not on file    Physically abused: Not on file    Forced sexual activity: Not on file  Other Topics Concern  . Not on file  Social History Narrative   Originally from [place]   Came to [employer]. In 1995   Lives at home with wife, a son and his wife and 3 grandchildren.    FH:  Family History  Problem Relation Age of Onset  . Stroke Mother   . Heart disease Mother   . Heart disease Father   . Diabetes Father   . Kidney disease Father        , history of stones as well  . Diabetes Brother   . Cancer Sister 21       unknown soft tissue cancer of leg    Past Medical History:  Diagnosis Date  . Arthritis    "knees"  (07/13/2018)  . Atrial fibrillation ([place]) 2016  . CAD (coronary artery disease)   . High cholesterol   . Hypertension 2006  . Idiopathic cardiomyopathy ([place]) 11/01/2014   Followed by Dr. Acie Fredrickson, Cardiology  EF 25%  Adenosine Cardiolite did not support ischemic etiology  . OA (osteoarthritis) of knee 01/08/2017  . Pleural effusion   . Presence of permanent cardiac pacemaker 07/13/2018  . Refusal of blood transfusions as patient is Jehovah's Witness   . Type II diabetes mellitus ([place]) 2001    Current Outpatient Medications  Medication Sig Dispense Refill  . acetaminophen (TYLENOL) 650 MG CR tablet Take 650 mg by mouth every 8 (eight) hours as needed for pain. Reported on 02/14/2016    . atorvastatin (LIPITOR) 80 MG tablet Take 80 mg by mouth daily.     . Blood Glucose Monitoring Suppl (AGAMATRIX PRESTO PRO METER) DEVI Twice daily blood glucose checks before meals 1 Device 0  . carvedilol (COREG) 6.25 MG tablet Take 1 tablet (6.25 mg total) by mouth 2 (two) times daily with a meal. 60 tablet 6  . digoxin (LANOXIN) 0.125 MG tablet Take 1 tablet (125 mcg total) by mouth daily. 30 tablet 6  . ferrous gluconate (FERGON) 324 MG tablet Take 1 tablet (324 mg total) by mouth daily with breakfast.  3  . furosemide (LASIX) 40 MG tablet Take 1 tablet (40 mg total) by mouth 2 (two) times daily. 60 tablet 11  . glipiZIDE (GLUCOTROL) 5 MG tablet Take 2.5 mg by mouth 2 (two) times daily before a meal.     . glucose blood (AGAMATRIX PRESTO TEST) test strip Twice daily glucose checks before meals 100 each 12  . Glycerin-Hypromellose-PEG 400 ([employer] DRY EYE RELIEF) 0.2-0.2-1 % SOLN Place 2 drops into both eyes as needed.    Marland Kitchen losartan (COZAAR) 25 MG tablet Take 0.5 tablets (12.5 mg total) by mouth daily. 30 tablet 11  . rivaroxaban (XARELTO) 20 MG TABS tablet Take 1 tablet (20 mg total) by mouth daily with supper. 30 tablet 1  . spironolactone (ALDACTONE) 25 MG tablet Take 1 tablet (25 mg total) by mouth daily. 30  tablet 11   No current facility-administered medications for this encounter.     Vitals:   08/25/18 1450  BP: 106/68  Pulse: 88  SpO2: 100%  Weight: 101.2 kg (223 lb)   Wt Readings from Last 3 Encounters:  08/25/18 101.2 kg (223 lb)  07/22/18 96.2 kg (212 lb)  07/21/18 98.2 kg (216 lb 9.6 oz)   PHYSICAL EXAM: Spanish Interpreter present.  General:  Well appearing. No resp difficulty.  HEENT: Normal Neck: Supple. No JVD. Carotids 2+ bilat; no bruits. No thyromegaly or nodule noted. Chest: PMI nondisplaced. RRR, No M/G/R noted. ICD site stable. Bilateral nipple tenderness Lungs: CTAB, normal effort. Abdomen: Soft, non-tender, non-distended, no HSM. No bruits or masses. +BS  Extremities: No cyanosis, clubbing, or rash. R and LLE no edema.  Neuro: Alert & orientedx3, cranial nerves grossly intact. moves all 4 extremities w/o difficulty. Affect pleasant    EKG: BiV paced 80 bpm, QRS 178 ms, personally reviewed.   ASSESSMENT & PLAN: 1. Chronic Systolic Heart Failure, NICM. LHC 2018 50% LAD - ECHO 06/2018 EF 15%  LA severely dilated. EF has been down for years.  - 07/13/2018 S/P St Jude CRT-P.  - Can repeat echo in several months now that he has CRT, but his EF has been chronically depressed.  - NYHA II symptoms now with CRT. - Volume status stable on exam.   - Continue lasix 40 mg twice a day.  - Continue carvedilol 6.25 mg twice a day + 0.125 mg digoxin daily.   - Continue 12.5 mg losartan daily - + gyenocomastia with spiro. Will stop. Start inspra 25 mg daily. He has [employer], so should be able to get. Recent BMET stable. Repeat BMET 10 days with change to inspra.  2. LBBB s/p St Jude CRT-P.  - Follows with EP and Device clinic next month.  - Device stable on interrogation. BiV pacing 99% of the time.  3. Chronic A fib - Rate controlled.  - Continue xarelto 20 mg daily. Denies bleeding.  4. CKD Stage III - Cr 1.23 09/13/2017  5. DMII - Per PCP.   Doing well overall  post CRT implant. Change to inspra with gynecomastia.   Shirley Friar, PA-C  2:57 PM   Patient seen and examined with the above-signed Advanced Practice Provider and/or Housestaff. I personally reviewed laboratory data, imaging studies and relevant notes. I independently examined the patient and formulated the important aspects of the plan. I have edited the note to reflect any of my changes or salient points. I have personally discussed the plan with the patient and/or family.  He is much improved with CRT-P. NYHA II-III. Volume status stable. Agree with switch to Inspra. ICD interrogated personally. No VT/AF. Volume ok. AF stable and rate controlled. Tolerating Xarelto without bleeding .   Repeat echo to reassess EF after CRT.   Glori Bickers, MD  3:31 PM

## 2018-08-26 ENCOUNTER — Other Ambulatory Visit: Payer: Self-pay

## 2018-08-26 MED ORDER — EPLERENONE 25 MG PO TABS: 25.0000 mg | ORAL_TABLET | Freq: Every day | ORAL | 3 refills | Status: DC

## 2018-09-01 ENCOUNTER — Other Ambulatory Visit: Payer: Self-pay

## 2018-09-01 MED ORDER — ATORVASTATIN CALCIUM 80 MG PO TABS: 80.0000 mg | ORAL_TABLET | Freq: Every day | ORAL | 11 refills | Status: DC

## 2018-09-03 ENCOUNTER — Ambulatory Visit (HOSPITAL_COMMUNITY)
Admission: RE | Admit: 2018-09-03 | Discharge: 2018-09-03 | Disposition: A | Discharge status: Home / Self Care | Payer: No Typology Code available for payment source | Source: Ambulatory Visit | Attending: Internal Medicine | Admitting: Internal Medicine

## 2018-09-03 DIAGNOSIS — I5022 Chronic systolic (congestive) heart failure: Secondary | ICD-10-CM | POA: Insufficient documentation

## 2018-09-03 LAB — BASIC METABOLIC PANEL
Anion gap: 7 (ref 5–15)
BUN: 34 mg/dL — AB (ref 8–23)
CALCIUM: 9.7 mg/dL (ref 8.9–10.3)
CO2: 29 mmol/L (ref 22–32)
Chloride: 105 mmol/L (ref 98–111)
Creatinine, Ser: 1.14 mg/dL (ref 0.61–1.24)
GFR calc Af Amer: >60 mL/min (ref >60)
Glucose, Bld: 99 mg/dL (ref 70–99)
Potassium: 4.5 mmol/L (ref 3.5–5.1)
SODIUM: 141 mmol/L (ref 135–145)

## 2018-09-21 ENCOUNTER — Encounter: Payer: Self-pay | Admitting: Internal Medicine

## 2018-10-12 ENCOUNTER — Ambulatory Visit (INDEPENDENT_AMBULATORY_CARE_PROVIDER_SITE_OTHER): Payer: No Typology Code available for payment source | Admitting: Internal Medicine

## 2018-10-12 ENCOUNTER — Encounter: Payer: Self-pay | Admitting: Internal Medicine

## 2018-10-12 VITALS — BP 104/70 | HR 84 | Ht 75.0 in | Wt 231.0 lb

## 2018-10-12 DIAGNOSIS — I5022 Chronic systolic (congestive) heart failure: Secondary | ICD-10-CM

## 2018-10-12 DIAGNOSIS — I447 Left bundle-branch block, unspecified: Secondary | ICD-10-CM

## 2018-10-12 DIAGNOSIS — Z950 Presence of cardiac pacemaker: Secondary | ICD-10-CM

## 2018-10-12 DIAGNOSIS — I4821 Permanent atrial fibrillation: Secondary | ICD-10-CM

## 2018-10-12 MED ORDER — LOSARTAN POTASSIUM 25 MG PO TABS: 12.5000 mg | ORAL_TABLET | Freq: Every day | ORAL | 11 refills | Status: DC

## 2018-10-12 NOTE — Progress Notes (Signed)
HPI [name] returns today for followup. He has chronic systolic heart failure, chronic atrial fib and underwent biv ppm insertion. He has done well in the interim after experiencing a large hematoma due to systemic anti-coagulation. He is limited by pain in his legs. He has undergone workup with dopplers which was apparently negative. He still has some soreness in his nipples. He was on aldactone but was switched to inspra. No edema.  Allergies  Allergen Reactions  . Spironolactone Other (See Comments)    Gynecomastia     Current Outpatient Medications  Medication Sig Dispense Refill  . acetaminophen (TYLENOL) 650 MG CR tablet Take 650 mg by mouth every 8 (eight) hours as needed for pain. Reported on 02/14/2016    . atorvastatin (LIPITOR) 80 MG tablet Take 1 tablet (80 mg total) by mouth daily. 30 tablet 11  . Blood Glucose Monitoring Suppl (AGAMATRIX PRESTO PRO METER) DEVI Twice daily blood glucose checks before meals 1 Device 0  . carvedilol (COREG) 6.25 MG tablet Take 1 tablet (6.25 mg total) by mouth 2 (two) times daily with a meal. 60 tablet 6  . digoxin (LANOXIN) 0.125 MG tablet Take 1 tablet (125 mcg total) by mouth daily. 30 tablet 6  . eplerenone (INSPRA) 25 MG tablet Take 1 tablet (25 mg total) by mouth daily. 30 tablet 3  . ferrous gluconate (FERGON) 324 MG tablet Take 1 tablet (324 mg total) by mouth daily with breakfast.  3  . furosemide (LASIX) 40 MG tablet Take 1 tablet (40 mg total) by mouth 2 (two) times daily. 60 tablet 11  . glipiZIDE (GLUCOTROL) 5 MG tablet Take 2.5 mg by mouth 2 (two) times daily before a meal.     . glucose blood (AGAMATRIX PRESTO TEST) test strip Twice daily glucose checks before meals 100 each 12  . Glycerin-Hypromellose-PEG 400 ([facility]S DRY EYE RELIEF) 0.2-0.2-1 % SOLN Place 2 drops into both eyes as needed.    Marland Kitchen losartan (COZAAR) 25 MG tablet Take 0.5 tablets (12.5 mg total) by mouth daily. 30 tablet 11  . rivaroxaban (XARELTO) 20 MG TABS  tablet Take 1 tablet (20 mg total) by mouth daily with supper. 30 tablet 1   No current facility-administered medications for this visit.      Past Medical History:  Diagnosis Date  . Arthritis    "knees" (07/13/2018)  . Atrial fibrillation ([place]) 2016  . CAD (coronary artery disease)   . High cholesterol   . Hypertension 2006  . Idiopathic cardiomyopathy ([place]) 11/01/2014   Followed by Dr. Acie Fredrickson, Cardiology  EF 25%  Adenosine Cardiolite did not support ischemic etiology  . OA (osteoarthritis) of knee 01/08/2017  . Pleural effusion   . Presence of permanent cardiac pacemaker 07/13/2018  . Refusal of blood transfusions as patient is Jehovah's Witness   . Type II diabetes mellitus ([place]) 2001    ROS:   All systems reviewed and negative except as noted in the HPI.   Past Surgical History:  Procedure Laterality Date  . BI-VENTRICULAR PACEMAKER INSERTION (CRT-P)  07/13/2018  . BIV PACEMAKER INSERTION CRT-P N/A 07/13/2018   Procedure: BIV PACEMAKER INSERTION CRT-P;  Surgeon: Evans Lance, MD;  Location: [facility];  Service: Cardiovascular;  Laterality: N/A;  . COLONOSCOPY WITH PROPOFOL N/A 05/14/2016   Procedure: COLONOSCOPY WITH PROPOFOL;  Surgeon: Jerene Bears, MD;  Location: [facility];  Service: Gastroenterology;  Laterality: N/A;  . ESOPHAGOGASTRODUODENOSCOPY (EGD) WITH PROPOFOL N/A 05/14/2016  Procedure: ESOPHAGOGASTRODUODENOSCOPY (EGD) WITH PROPOFOL;  Surgeon: Jerene Bears, MD;  Location: [facility];  Service: Gastroenterology;  Laterality: N/A;  . HERNIA REPAIR    . INCISE AND DRAIN ABCESS Left 1986   on leg  . INGUINAL HERNIA REPAIR  01/16/2012   Procedure: LAPAROSCOPIC INGUINAL HERNIA;  Surgeon: Gayland Curry, MD,FACS;  Location: [facility];  Service: General;  Laterality: Left;  Laparoscopic incarcerated Left inguinal hernia repair with mesh, umbilical hernia repair  . UMBILICAL HERNIA REPAIR  01/16/2012   Procedure: HERNIA REPAIR UMBILICAL ADULT;  Surgeon: Gayland Curry, MD,FACS;  Location: [facility];  Service: General;  Laterality: N/A;     Family History  Problem Relation Age of Onset  . Stroke Mother   . Heart disease Mother   . Heart disease Father   . Diabetes Father   . Kidney disease Father        , history of stones as well  . Diabetes Brother   . Cancer Sister 26       unknown soft tissue cancer of leg     Social History   Socioeconomic History  . Marital status: Married    Spouse name: Not on file  . Number of children: 5  . Years of education: 9  . Highest education level: Not on file  Occupational History  . Occupation: Ambulance person constrution    Comment: Barista  Social Needs  . Financial resource strain: Not on file  . Food insecurity:    Worry: Not on file    Inability: Not on file  . Transportation needs:    Medical: Not on file    Non-medical: Not on file  Tobacco Use  . Smoking status: Never Smoker  . Smokeless tobacco: Never Used  Substance and Sexual Activity  . Alcohol use: Yes    Alcohol/week: 0.0 standard drinks    Comment: 07/13/2018 "used to drink; none since 2015"  . Drug use: Never  . Sexual activity: Yes  Lifestyle  . Physical activity:    Days per week: Not on file    Minutes per session: Not on file  . Stress: Not on file  Relationships  . Social connections:    Talks on phone: Not on file    Gets together: Not on file    Attends religious service: Not on file    Active member of club or organization: Not on file    Attends meetings of clubs or organizations: Not on file    Relationship status: Not on file  . Intimate partner violence:    Fear of current or ex partner: Not on file    Emotionally abused: Not on file    Physically abused: Not on file    Forced sexual activity: Not on file  Other Topics Concern  . Not on file  Social History Narrative   Originally from [place]   Came to [employer]. In 1995   Lives at home with wife, a son and his wife and 3 grandchildren.     BP  104/70   Pulse 84   Ht 6\' 3"  (1.905 m)   Wt 231 lb (104.8 kg)   SpO2 98%   BMI 28.87 kg/m   Physical Exam:  Well appearing NAD HEENT: Unremarkable Neck:  No JVD, no thyromegally Lymphatics:  No adenopathy Back:  No CVA tenderness Lungs:  Clear HEART:  Regular rate rhythm, no murmurs, no rubs, no clicks Abd:  soft, positive bowel sounds, no organomegally, no  rebound, no guarding Ext:  2 plus pulses, no edema, no cyanosis, no clubbing Skin:  No rashes no nodules Neuro:  CN II through XII intact, motor grossly intact  EKG - atrial fib with biv pacing  DEVICE  Normal device function.  See PaceArt for details.   Assess/Plan: 1. Chronic systolic heart failure - his symptoms are much improved. 2. Atrial fib - his rates are well controlled. 3. Hematoma - he has healed up completely. No pain. No swelling. 4. CAD - he denies anginal symptoms. He is encouraged to increase his activity as his endurance allows.  Mikle Bosworth.D.

## 2018-10-12 NOTE — Patient Instructions (Signed)
Medication Instructions:  Your physician recommends that you continue on your current medications as directed. Please refer to the Current Medication list given to you today.  Labwork: None ordered.  Testing/Procedures: None ordered.  Follow-Up: Your physician wants you to follow-up in: 9 months with Dr. Lovena Le.   You will receive a reminder letter in the mail two months in advance. If you don't receive a letter, please call our office to schedule the follow-up appointment.  Remote monitoring is used to monitor your Pacemaker from home. This monitoring reduces the number of office visits required to check your device to one time per year. It allows [place] to keep an eye on the functioning of your device to ensure it is working properly. You are scheduled for a device check from home on 01/11/2019. You may send your transmission at any time that day. If you have a wireless device, the transmission will be sent automatically. After your physician reviews your transmission, you will receive a postcard with your next transmission date.  Any Other Special Instructions Will Be Listed Below (If Applicable).  If you need a refill on your cardiac medications before your next appointment, please call your pharmacy.

## 2018-10-13 LAB — CUP PACEART INCLINIC DEVICE CHECK
Implantable Lead Implant Date: 20191126
Implantable Lead Implant Date: 20191126
Implantable Lead Location: 753860
Implantable Lead Model: 3830
MDC IDC LEAD IMPLANT DT: 20191126
MDC IDC LEAD LOCATION: 753858
MDC IDC LEAD LOCATION: 753860
MDC IDC PG IMPLANT DT: 20191126
MDC IDC SESS DTM: 20200226082625
Pulse Gen Serial Number: 9060242

## 2018-10-21 ENCOUNTER — Other Ambulatory Visit: Payer: Self-pay | Admitting: Internal Medicine

## 2018-10-21 ENCOUNTER — Encounter: Payer: Self-pay | Admitting: Internal Medicine

## 2018-10-21 ENCOUNTER — Ambulatory Visit: Payer: Self-pay | Admitting: Internal Medicine

## 2018-10-21 VITALS — BP 100/70 | HR 86 | Ht 75.0 in | Wt 227.5 lb

## 2018-10-21 DIAGNOSIS — N183 Chronic kidney disease, stage 3 unspecified: Secondary | ICD-10-CM

## 2018-10-21 DIAGNOSIS — R809 Proteinuria, unspecified: Secondary | ICD-10-CM

## 2018-10-21 DIAGNOSIS — M170 Bilateral primary osteoarthritis of knee: Secondary | ICD-10-CM

## 2018-10-21 DIAGNOSIS — E1129 Type 2 diabetes mellitus with other diabetic kidney complication: Secondary | ICD-10-CM

## 2018-10-21 DIAGNOSIS — N529 Male erectile dysfunction, unspecified: Secondary | ICD-10-CM

## 2018-10-21 DIAGNOSIS — D649 Anemia, unspecified: Secondary | ICD-10-CM

## 2018-10-21 DIAGNOSIS — Z1159 Encounter for screening for other viral diseases: Secondary | ICD-10-CM

## 2018-10-21 DIAGNOSIS — I482 Chronic atrial fibrillation, unspecified: Secondary | ICD-10-CM

## 2018-10-21 DIAGNOSIS — E1122 Type 2 diabetes mellitus with diabetic chronic kidney disease: Secondary | ICD-10-CM

## 2018-10-21 DIAGNOSIS — E785 Hyperlipidemia, unspecified: Secondary | ICD-10-CM

## 2018-10-21 DIAGNOSIS — I5022 Chronic systolic (congestive) heart failure: Secondary | ICD-10-CM

## 2018-10-21 DIAGNOSIS — I10 Essential (primary) hypertension: Secondary | ICD-10-CM

## 2018-10-21 MED ORDER — SILDENAFIL CITRATE 100 MG PO TABS: ORAL_TABLET | ORAL | 11 refills | Status: DC

## 2018-10-21 MED ORDER — RIVAROXABAN 20 MG PO TABS: 20.0000 mg | ORAL_TABLET | Freq: Every day | ORAL | 11 refills | Status: DC

## 2018-10-21 NOTE — Progress Notes (Signed)
Subjective:    Patient ID: Kyle Burke, male   DOB: 1946/05/12, 73 y.o.   MRN: [id number]   HPI   1.  Cardiomyopathy:  Had hematoma of pacemaker pocket about 3 weeks after being seen in December.  This was drained and he has done well since. He was having mastalgia and Aldactone switched to Inspra.  Reportedly given 3 month supply of the latter from Dr. Lovena Le.  His discomfort is mildly improved.  He does not believe he has had any increased size in breast tissue. He continues on Losartan as well as Furosemide.   Energy and endurance remain very good since placement of biventricular pacemaker.  He is walking 3 miles daily on treadmill.  He continues to have knee pain from chronic arthritis and is in PT with [facility], which is helping.   No LE edema. Weight 227 lbs today.  Last visit weighed 212 lbs, however, weight down from the 25th of Feb.    2.  Chronic Afib:  Needs new Rx for MAP regarding Xarelto.  3.  OA bilateral Knees:  As in #1.  Feels it is helping.  4.  DM:  Tolerating Glipizide 5 mg twice daily.  Sugars ranging from 90s to low 100s.  He is due for A1C   5.  Hypertension:  Controlled  6.  Left shoulder pain: points to [facility] joint.  He has not taken Tylenol for this as the pain is not frequent nor severe enough.    7.  ED:  Problem for 6 months.  Able to obtain a bit of erection, but lasts for just a couple of minutes.  He did not bring this up with Dr. Lovena Le. Called and spoke with Dr. Tanna Furry assistant--she reported Dr. Lovena Le is fine from a cardiac standpoint for him to take Viagra or related.    Current Meds  Medication Sig  . acetaminophen (TYLENOL) 650 MG CR tablet Take 650 mg by mouth every 8 (eight) hours as needed for pain. Reported on 02/14/2016  . atorvastatin (LIPITOR) 80 MG tablet Take 1 tablet (80 mg total) by mouth daily.  . Blood Glucose Monitoring Suppl (AGAMATRIX PRESTO PRO METER) DEVI Twice daily blood glucose checks before  meals  . carvedilol (COREG) 6.25 MG tablet Take 1 tablet (6.25 mg total) by mouth 2 (two) times daily with a meal.  . digoxin (LANOXIN) 0.125 MG tablet Take 1 tablet (125 mcg total) by mouth daily.  Marland Kitchen eplerenone (INSPRA) 25 MG tablet Take 1 tablet (25 mg total) by mouth daily.  . ferrous gluconate (FERGON) 324 MG tablet Take 1 tablet (324 mg total) by mouth daily with breakfast.  . furosemide (LASIX) 40 MG tablet Take 1 tablet (40 mg total) by mouth 2 (two) times daily.  Marland Kitchen glipiZIDE (GLUCOTROL) 5 MG tablet Take 2.5 mg by mouth 2 (two) times daily before a meal.   . glucose blood (AGAMATRIX PRESTO TEST) test strip Twice daily glucose checks before meals  . Glycerin-Hypromellose-PEG 400 ([employer] DRY EYE RELIEF) 0.2-0.2-1 % SOLN Place 2 drops into both eyes as needed.  Marland Kitchen losartan (COZAAR) 25 MG tablet Take 0.5 tablets (12.5 mg total) by mouth daily.  . rivaroxaban (XARELTO) 20 MG TABS tablet Take 1 tablet (20 mg total) by mouth daily with supper.   Allergies  Allergen Reactions  . Spironolactone Other (See Comments)    Gynecomastia     Review of Systems  No chest pain     Objective:  BP 100/70 (BP Location: Left Arm, Patient Position: Sitting, Cuff Size: Normal)   Pulse 86   Ht 6\' 3"  (1.905 m)   Wt 227 lb 8 oz (103.2 kg)   BMI 28.44 kg/m   Physical Exam  NAD Lungs:  CTA CV:  RRR without murmur or rub.  Radial and DP pulses normal and equal. Abd:  S, NT, No HSM or mass, + BS LE:  No edema.  Varicosities.     Diabetic Foot Exam - Simple   Simple Foot Form Diabetic Foot exam was performed with the following findings:  Yes 10/21/2018  9:17 AM  Visual Inspection No deformities, no ulcerations, no other skin breakdown bilaterally:  Yes Sensation Testing Intact to touch and monofilament testing bilaterally:  Yes Pulse Check Posterior Tibialis and Dorsalis pulse intact bilaterally:  Yes Comments Broken veins and varicosities of LE and feet. Great toenails with thickening in areas  and discoloration.        Assessment & Plan   Interpreted visit.  1.  Cardiomyopathy with compensated CHF:  Doing so much better following biventricular pacemaker placement.  He is to work on diet and continue with regular physical activity to get weight down.  CMP  2.  Chronic Afib:  Xarelto refilled with MAP.  CBC  3.  DM:  Urine microalbumin/crea (history of microalbuminuria, mild) and A1C today.  Has always been well controlled. Diabetic eye exam referral.  4.  Hypertension:  Controlled  5.  Left shoulder/AC joint pain: minimal:  To try Tylenol as needed.  6.  ED:  Sildenafil 50 to 100 mg as needed in 24 hour period.  Discussed need to notify EMS he takes this should he develop chest pain.  Is walking 3 miles daily without chest pain.  7.  Chronic normocytic anemia:  CBC  8.  Dyslipidemia:  FLP  9.  HM:  Encouraged him to obtain Pneumococcal 13 at [facility].  Hep C screen today as well.

## 2018-10-21 NOTE — Patient Instructions (Signed)
[facility] Pneumococcal 13 a [facility] Department in next couple of months.

## 2018-10-22 LAB — CBC WITH DIFFERENTIAL/PLATELET
BASOS ABS: 0 10*3/uL (ref 0.0–0.2)
BASOS: 1 %
EOS (ABSOLUTE): 0.1 10*3/uL (ref 0.0–0.4)
Eos: 4 %
HEMOGLOBIN: 11.8 g/dL — AB (ref 13.0–17.7)
Hematocrit: 34.8 % — ABNORMAL LOW (ref 37.5–51.0)
Immature Grans (Abs): 0 10*3/uL (ref 0.0–0.1)
Immature Granulocytes: 0 %
LYMPHS ABS: 1.2 10*3/uL (ref 0.7–3.1)
Lymphs: 34 %
MCH: 32.3 pg (ref 26.6–33.0)
MCHC: 33.9 g/dL (ref 31.5–35.7)
MCV: 95 fL (ref 79–97)
Monocytes Absolute: 0.5 10*3/uL (ref 0.1–0.9)
Monocytes: 14 %
NEUTROS ABS: 1.7 10*3/uL (ref 1.4–7.0)
Neutrophils: 47 %
Platelets: 169 10*3/uL (ref 150–450)
RBC: 3.65 x10E6/uL — ABNORMAL LOW (ref 4.14–5.80)
RDW: 14.3 % (ref 11.6–15.4)
WBC: 3.6 10*3/uL (ref 3.4–10.8)

## 2018-10-22 LAB — MICROALBUMIN / CREATININE URINE RATIO
Creatinine, Urine: 46.8 mg/dL
Microalbumin, Urine: <3 ug/mL

## 2018-10-22 LAB — COMPREHENSIVE METABOLIC PANEL
ALT: 24 IU/L (ref 0–44)
AST: 19 IU/L (ref 0–40)
Albumin/Globulin Ratio: 1.5 (ref 1.2–2.2)
Albumin: 4.5 g/dL (ref 3.7–4.7)
Alkaline Phosphatase: 97 IU/L (ref 39–117)
BUN/Creatinine Ratio: 28 — ABNORMAL HIGH (ref 10–24)
BUN: 33 mg/dL — ABNORMAL HIGH (ref 8–27)
Bilirubin Total: 1.2 mg/dL (ref 0.0–1.2)
CO2: 25 mmol/L (ref 20–29)
Calcium: 9.3 mg/dL (ref 8.6–10.2)
Chloride: 102 mmol/L (ref 96–106)
Creatinine, Ser: 1.18 mg/dL (ref 0.76–1.27)
GFR calc Af Amer: 71 mL/min/{1.73_m2} (ref >59)
GFR calc non Af Amer: 62 mL/min/{1.73_m2} (ref >59)
Globulin, Total: 3 g/dL (ref 1.5–4.5)
Glucose: 95 mg/dL (ref 65–99)
Potassium: 4.5 mmol/L (ref 3.5–5.2)
Sodium: 142 mmol/L (ref 134–144)
Total Protein: 7.5 g/dL (ref 6.0–8.5)

## 2018-10-22 LAB — LIPID PANEL W/O CHOL/HDL RATIO
Cholesterol, Total: 112 mg/dL (ref 100–199)
HDL: 50 mg/dL (ref >39)
LDL Calculated: 51 mg/dL (ref 0–99)
Triglycerides: 53 mg/dL (ref 0–149)
VLDL Cholesterol Cal: 11 mg/dL (ref 5–40)

## 2018-10-22 LAB — HEPATITIS C ANTIBODY: Hep C Virus Ab: 0.1 s/co ratio (ref 0.0–0.9)

## 2018-10-22 LAB — HGB A1C W/O EAG: HEMOGLOBIN A1C: 5.3 % (ref 4.8–5.6)

## 2018-10-25 NOTE — Progress Notes (Signed)
PCP: Dr Amil Amen  Cardiologist: Dr Philbert Riser Primary HF Cardiologist: Dr Vaughan Browner  Spanish Speaking EP : Dr Lovena Le.    HPI: Kyle Burke is a 73 y.o. male [place] speaking male with a history of  chronic systolic heart failure, NICM,  LBBB,  DMII, chroinc A fib, and chronic anticoagulation with xarelto. He does not have medical insurance. He is not a [place] citizen. He requires assistance with medications.  He has been followed by Dr Philbert Riser at [facility]. He was last seen 01/2018. He was stable at that time. Weight was 220 pounds.  Admitted with A/C systolic heart failure. Diuresed with IV lasix and once diuresed transitioned to lasix 40 mg twice a day. EP consulted for LBBB. He is being considered for charitable CRT-D. Discharge weight was 223 pounds.   Admitted 07/13/18 for scheduled CRT-P. He was discharged on 07/14/18 on higher dose of carvedilol and digoxin was started.    Seen in ED 08/13/18 with hematoma around his CRT device. Completed course of Keflex and followed up in ICD device clinic 08/19/2018.   Interpreter present He presents today for 2 month follow up. Last visit spiro was switched to Inspra with gynecomastia. Overall doing well. Denies SOB. Walks for 30 minutes/day with small incline. No edema, orthopnea, or PND. Having bilateral knee pain and going to outpatient PT with some improvement. No cough, CP, or dizziness. He continues to have nipple pain, but has improved a little with switch from spiro. No bleeding on Eliquis. Taking all medications. Weighs daily 227 lbs.   ICD interrogation: 21 days of volume overload starting 08/27/18. Thoracic impedence just below threshold, but threshold is now significantly lower. 0% AF.   Cardiac Testing  Echo 06/2018  Compared to a prior echo in 2017, the LVEF is further decreased to 15% with severe global hypokinesis, inferior akinesis and septal dyskinesis.RV mildly reduced.   ECHO 2017 [place] EF 20-25% RV mildly dilated. Severe  hypokinesis.  RHC/ LHC [facility] 2018 at [facility] Nonobs LAD 50%,mild om2.  RA 12 PCWP 31  Review of systems complete and found to be negative unless listed in HPI.   SH:  Social History   Socioeconomic History  . Marital status: Married    Spouse name: Not on file  . Number of children: 5  . Years of education: 9  . Highest education level: Not on file  Occupational History  . Occupation: Ambulance person constrution    Comment: Barista  Social Needs  . Financial resource strain: Not on file  . Food insecurity:    Worry: Not on file    Inability: Not on file  . Transportation needs:    Medical: Not on file    Non-medical: Not on file  Tobacco Use  . Smoking status: Never Smoker  . Smokeless tobacco: Never Used  Substance and Sexual Activity  . Alcohol use: Yes    Alcohol/week: 0.0 standard drinks    Comment: 07/13/2018 "used to drink; none since 2015"  . Drug use: Never  . Sexual activity: Yes  Lifestyle  . Physical activity:    Days per week: Not on file    Minutes per session: Not on file  . Stress: Not on file  Relationships  . Social connections:    Talks on phone: Not on file    Gets together: Not on file    Attends religious service: Not on file    Active member of club or organization: Not on file    Attends meetings of  clubs or organizations: Not on file    Relationship status: Not on file  . Intimate partner violence:    Fear of current or ex partner: Not on file    Emotionally abused: Not on file    Physically abused: Not on file    Forced sexual activity: Not on file  Other Topics Concern  . Not on file  Social History Narrative   Originally from [place]   Came to [employer] In 1995   Lives at home with wife, a son and his wife and 3 grandchildren.    FH:  Family History  Problem Relation Age of Onset  . Stroke Mother   . Heart disease Mother   . Heart disease Father   . Diabetes Father   . Kidney disease Father        , history of stones as well   . Diabetes Brother   . Cancer Sister 70       unknown soft tissue cancer of leg    Past Medical History:  Diagnosis Date  . Arthritis    "knees" (07/13/2018)  . Atrial fibrillation ([place]) 2016  . CAD (coronary artery disease)   . High cholesterol   . Hypertension 2006  . Idiopathic cardiomyopathy ([place]) 11/01/2014   Followed by Dr. Acie Fredrickson, Cardiology  EF 25%  Adenosine Cardiolite did not support ischemic etiology  . OA (osteoarthritis) of knee 01/08/2017  . Pleural effusion   . Presence of permanent cardiac pacemaker 07/13/2018  . Refusal of blood transfusions as patient is Jehovah's Witness   . Type II diabetes mellitus ([place]) 2001    Current Outpatient Medications  Medication Sig Dispense Refill  . acetaminophen (TYLENOL) 650 MG CR tablet Take 650 mg by mouth every 8 (eight) hours as needed for pain. Reported on 02/14/2016    . atorvastatin (LIPITOR) 80 MG tablet Take 1 tablet (80 mg total) by mouth daily. 30 tablet 11  . Blood Glucose Monitoring Suppl (AGAMATRIX PRESTO PRO METER) DEVI Twice daily blood glucose checks before meals 1 Device 0  . carvedilol (COREG) 6.25 MG tablet Take 1 tablet (6.25 mg total) by mouth 2 (two) times daily with a meal. 60 tablet 6  . digoxin (LANOXIN) 0.125 MG tablet Take 1 tablet (125 mcg total) by mouth daily. 30 tablet 6  . eplerenone (INSPRA) 25 MG tablet Take 1 tablet (25 mg total) by mouth daily. 30 tablet 3  . ferrous gluconate (FERGON) 324 MG tablet Take 1 tablet (324 mg total) by mouth daily with breakfast.  3  . furosemide (LASIX) 40 MG tablet Take 1 tablet (40 mg total) by mouth 2 (two) times daily. 60 tablet 11  . glipiZIDE (GLUCOTROL) 5 MG tablet Take 2.5 mg by mouth 2 (two) times daily before a meal.     . glucose blood (AGAMATRIX PRESTO TEST) test strip Twice daily glucose checks before meals 100 each 12  . Glycerin-Hypromellose-PEG 400 ([employer] DRY EYE RELIEF) 0.2-0.2-1 % SOLN Place 2 drops into both eyes as needed.    Marland Kitchen losartan (COZAAR) 25  MG tablet Take 0.5 tablets (12.5 mg total) by mouth daily. 30 tablet 11  . sildenafil (VIAGRA) 100 MG tablet 1/2 to 1 tab by mouth 1/2 hour before activity, only 1 tab max in 24 hours 10 tablet 11  . XARELTO 20 MG TABS tablet TAKE 1 TABLET BY MOUTH ONCE DAILY WITH SUPPER 30 tablet 11   No current facility-administered medications for this encounter.     Vitals:  10/26/18 1038  BP: 128/78  Pulse: 81  SpO2: 100%  Weight: 106 kg (233 lb 9.6 oz)   Wt Readings from Last 3 Encounters:  10/26/18 106 kg (233 lb 9.6 oz)  10/21/18 103.2 kg (227 lb 8 oz)  10/12/18 104.8 kg (231 lb)   PHYSICAL EXAM: Spanish Interpreter present.  General: Well appearing. No resp difficulty. HEENT: Normal Neck: Supple. JVP ~8. Carotids 2+ bilat; no bruits. No thyromegaly or nodule noted. Cor: PMI nondisplaced. RRR, No M/G/R noted Lungs: CTAB, normal effort. Abdomen: Soft, non-tender, non-distended, no HSM. No bruits or masses. +BS  Extremities: No cyanosis, clubbing, or rash. R and LLE no edema.  Neuro: Alert & orientedx3, cranial nerves grossly intact. moves all 4 extremities w/o difficulty. Affect pleasant    ASSESSMENT & PLAN: 1. Chronic Systolic Heart Failure, NICM. LHC 2018 50% LAD - ECHO 06/2018 EF 15%  LA severely dilated. EF has been down for years.  - 07/13/2018 S/P St Jude CRT-P.  - NYHA II symptoms now with CRT. - Volume status mildly elevated on exam. Looks okay on [place], but threshold significantly lower than before. Up 10 lbs.  - Increase lasix to 60 mg am, 40 mg pm. BMET in 7-10 days at PCP office.  - Continue carvedilol 6.25 mg twice a day  - Continue 0.125 mg digoxin daily.   - Continue 12.5 mg losartan daily - Having ongoing gynecomastia. Stop Inpsra for now. - Repeat echo to see if EF has improved with CRT. 2. LBBB s/p St Jude CRT-P.  - Follows  With Dr Lovena Le. No change.  3. Chronic A fib - Continue xarelto 20 mg daily. Denies bleeding.  - 0% AF burden on device interrogation   4. CKD Stage III - Cr 1.18 10/21/18. Check BMET in 7-10 days after med changes above.  5. DMII - Per PCP.   Stop Inspra Increase lasix 60 mg am, 40 mg pm.  BMET at Dr Amil Amen in 7-10 days Repeat echo 2 months at f/u  Georgiana Shore, NP  10:55 AM   Greater than 50% of the 25 minute visit was spent in counseling/coordination of care regarding disease state education, salt/fluid restriction, sliding scale diuretics, and medication compliance.

## 2018-10-26 ENCOUNTER — Encounter (HOSPITAL_COMMUNITY): Payer: Self-pay

## 2018-10-26 ENCOUNTER — Ambulatory Visit (HOSPITAL_COMMUNITY)
Admission: RE | Admit: 2018-10-26 | Discharge: 2018-10-26 | Disposition: A | Discharge status: Home / Self Care | Payer: No Typology Code available for payment source | Source: Ambulatory Visit | Attending: Cardiology | Admitting: Cardiology

## 2018-10-26 ENCOUNTER — Other Ambulatory Visit: Payer: Self-pay

## 2018-10-26 VITALS — BP 128/78 | HR 81 | Wt 233.6 lb

## 2018-10-26 DIAGNOSIS — I5022 Chronic systolic (congestive) heart failure: Secondary | ICD-10-CM | POA: Insufficient documentation

## 2018-10-26 DIAGNOSIS — Z7984 Long term (current) use of oral hypoglycemic drugs: Secondary | ICD-10-CM | POA: Insufficient documentation

## 2018-10-26 DIAGNOSIS — E7800 Pure hypercholesterolemia, unspecified: Secondary | ICD-10-CM | POA: Insufficient documentation

## 2018-10-26 DIAGNOSIS — Z7901 Long term (current) use of anticoagulants: Secondary | ICD-10-CM | POA: Insufficient documentation

## 2018-10-26 DIAGNOSIS — Z79899 Other long term (current) drug therapy: Secondary | ICD-10-CM | POA: Insufficient documentation

## 2018-10-26 DIAGNOSIS — I2510 Atherosclerotic heart disease of native coronary artery without angina pectoris: Secondary | ICD-10-CM | POA: Insufficient documentation

## 2018-10-26 DIAGNOSIS — N644 Mastodynia: Secondary | ICD-10-CM | POA: Insufficient documentation

## 2018-10-26 DIAGNOSIS — J90 Pleural effusion, not elsewhere classified: Secondary | ICD-10-CM | POA: Insufficient documentation

## 2018-10-26 DIAGNOSIS — I482 Chronic atrial fibrillation, unspecified: Secondary | ICD-10-CM | POA: Insufficient documentation

## 2018-10-26 DIAGNOSIS — I4821 Permanent atrial fibrillation: Secondary | ICD-10-CM

## 2018-10-26 DIAGNOSIS — Z8249 Family history of ischemic heart disease and other diseases of the circulatory system: Secondary | ICD-10-CM | POA: Insufficient documentation

## 2018-10-26 DIAGNOSIS — I428 Other cardiomyopathies: Secondary | ICD-10-CM | POA: Insufficient documentation

## 2018-10-26 DIAGNOSIS — N183 Chronic kidney disease, stage 3 unspecified: Secondary | ICD-10-CM

## 2018-10-26 DIAGNOSIS — Z950 Presence of cardiac pacemaker: Secondary | ICD-10-CM | POA: Insufficient documentation

## 2018-10-26 DIAGNOSIS — N62 Hypertrophy of breast: Secondary | ICD-10-CM | POA: Insufficient documentation

## 2018-10-26 DIAGNOSIS — I447 Left bundle-branch block, unspecified: Secondary | ICD-10-CM | POA: Insufficient documentation

## 2018-10-26 DIAGNOSIS — I130 Hypertensive heart and chronic kidney disease with heart failure and stage 1 through stage 4 chronic kidney disease, or unspecified chronic kidney disease: Secondary | ICD-10-CM | POA: Insufficient documentation

## 2018-10-26 DIAGNOSIS — E1122 Type 2 diabetes mellitus with diabetic chronic kidney disease: Secondary | ICD-10-CM | POA: Insufficient documentation

## 2018-10-26 MED ORDER — FUROSEMIDE 40 MG PO TABS: ORAL_TABLET | ORAL | 11 refills | Status: DC

## 2018-10-26 NOTE — Patient Instructions (Addendum)
STOP inspra  INCREASE Furosemide 60mg  (2 tabs) every morning and 40mg  (2 tabs) every evening.  Lab work will need to be done in 7-10 days. You have been given a prescription to fill this.  Your physician has requested that you have an echocardiogram. Echocardiography is a painless test that uses sound waves to create images of your heart. It provides your doctor with information about the size and shape of your heart and how well your heart's chambers and valves are working. This procedure takes approximately one hour. There are no restrictions for this procedure.  Follow up with the Advanced Practice Providers in 6-8 weeks.

## 2018-10-27 NOTE — Telephone Encounter (Signed)
q 

## 2018-11-03 ENCOUNTER — Other Ambulatory Visit (HOSPITAL_COMMUNITY): Payer: Self-pay | Admitting: *Deleted

## 2018-11-03 ENCOUNTER — Ambulatory Visit (HOSPITAL_COMMUNITY)
Admission: RE | Admit: 2018-11-03 | Discharge: 2018-11-03 | Disposition: A | Discharge status: Home / Self Care | Payer: No Typology Code available for payment source | Source: Ambulatory Visit | Attending: Internal Medicine | Admitting: Internal Medicine

## 2018-11-03 ENCOUNTER — Other Ambulatory Visit: Payer: Self-pay

## 2018-11-03 DIAGNOSIS — I5022 Chronic systolic (congestive) heart failure: Secondary | ICD-10-CM | POA: Insufficient documentation

## 2018-11-03 LAB — BASIC METABOLIC PANEL
Anion gap: 8 (ref 5–15)
BUN: 28 mg/dL — AB (ref 8–23)
CO2: 25 mmol/L (ref 22–32)
CREATININE: 1.16 mg/dL (ref 0.61–1.24)
Calcium: 9.5 mg/dL (ref 8.9–10.3)
Chloride: 107 mmol/L (ref 98–111)
GFR calc Af Amer: >60 mL/min (ref >60)
GFR calc non Af Amer: >60 mL/min (ref >60)
Glucose, Bld: 108 mg/dL — ABNORMAL HIGH (ref 70–99)
Potassium: 4.1 mmol/L (ref 3.5–5.1)
Sodium: 140 mmol/L (ref 135–145)

## 2018-11-15 ENCOUNTER — Other Ambulatory Visit (HOSPITAL_COMMUNITY): Payer: Self-pay | Admitting: Internal Medicine

## 2018-12-13 ENCOUNTER — Other Ambulatory Visit: Payer: Self-pay

## 2018-12-13 ENCOUNTER — Ambulatory Visit (HOSPITAL_COMMUNITY): Admission: RE | Admit: 2018-12-13 | Payer: No Typology Code available for payment source | Source: Ambulatory Visit

## 2018-12-13 ENCOUNTER — Other Ambulatory Visit (HOSPITAL_COMMUNITY): Payer: No Typology Code available for payment source

## 2019-01-11 ENCOUNTER — Ambulatory Visit (INDEPENDENT_AMBULATORY_CARE_PROVIDER_SITE_OTHER): Payer: No Typology Code available for payment source | Admitting: *Deleted

## 2019-01-11 DIAGNOSIS — I447 Left bundle-branch block, unspecified: Secondary | ICD-10-CM

## 2019-01-11 DIAGNOSIS — I5022 Chronic systolic (congestive) heart failure: Secondary | ICD-10-CM

## 2019-01-11 LAB — CUP PACEART REMOTE DEVICE CHECK
Date Time Interrogation Session: 20200526153233
Implantable Lead Implant Date: 20191126
Implantable Lead Implant Date: 20191126
Implantable Lead Implant Date: 20191126
Implantable Lead Location: 753858
Implantable Lead Location: 753860
Implantable Lead Location: 753860
Implantable Lead Model: 3830
Implantable Pulse Generator Implant Date: 20191126
Pulse Gen Serial Number: 9060242

## 2019-01-14 IMAGING — CR DG CHEST 2V
2 series · 2 of 2 positions shown · non-contrast
Comparison: 07/14/2018 and earlier.

CLINICAL DATA: Bleeding from the pacemaker insertion site in the
LEFT UPPER chest wall, associated with pain.

EXAM:
CHEST - 2 VIEW

[chest pa]
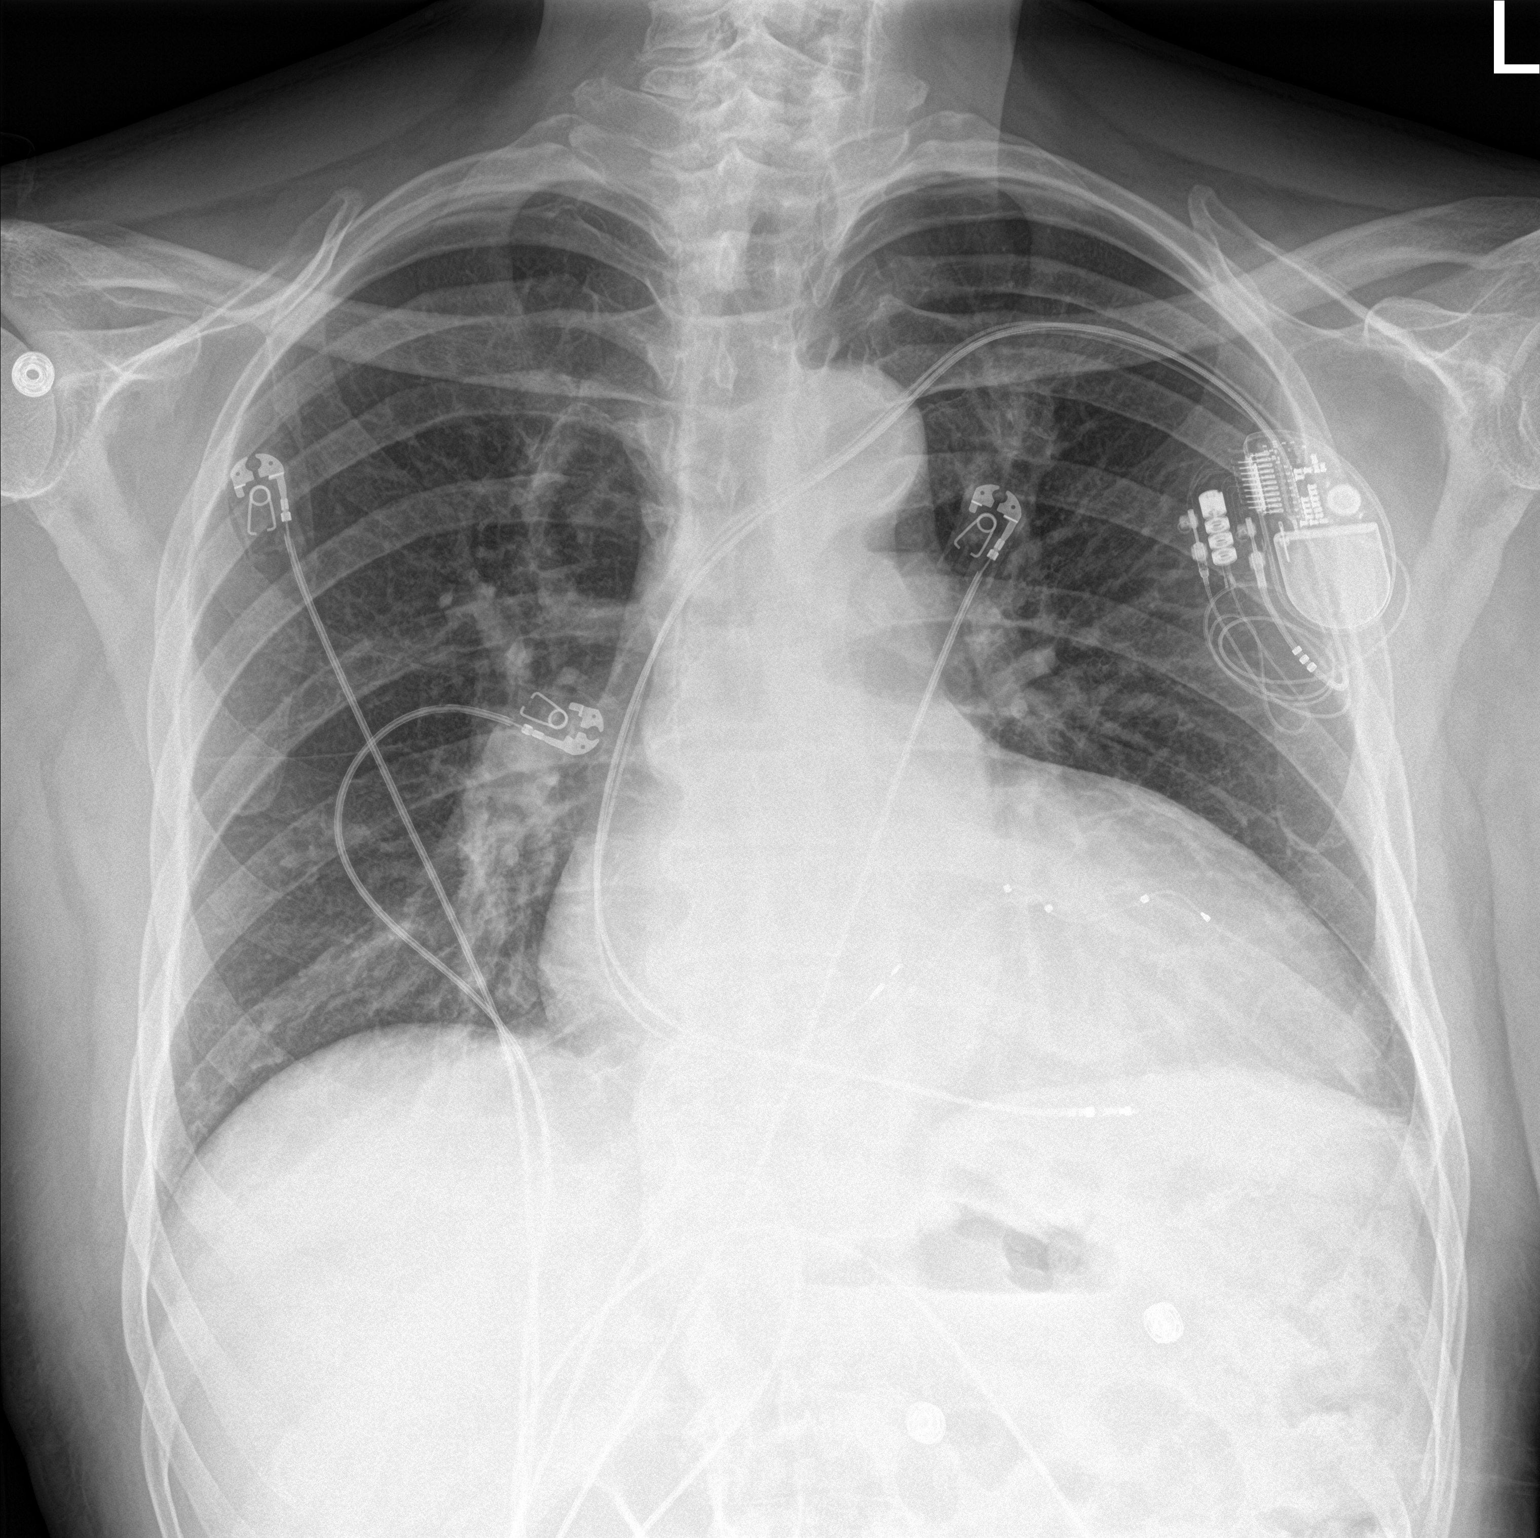

[chest lat]
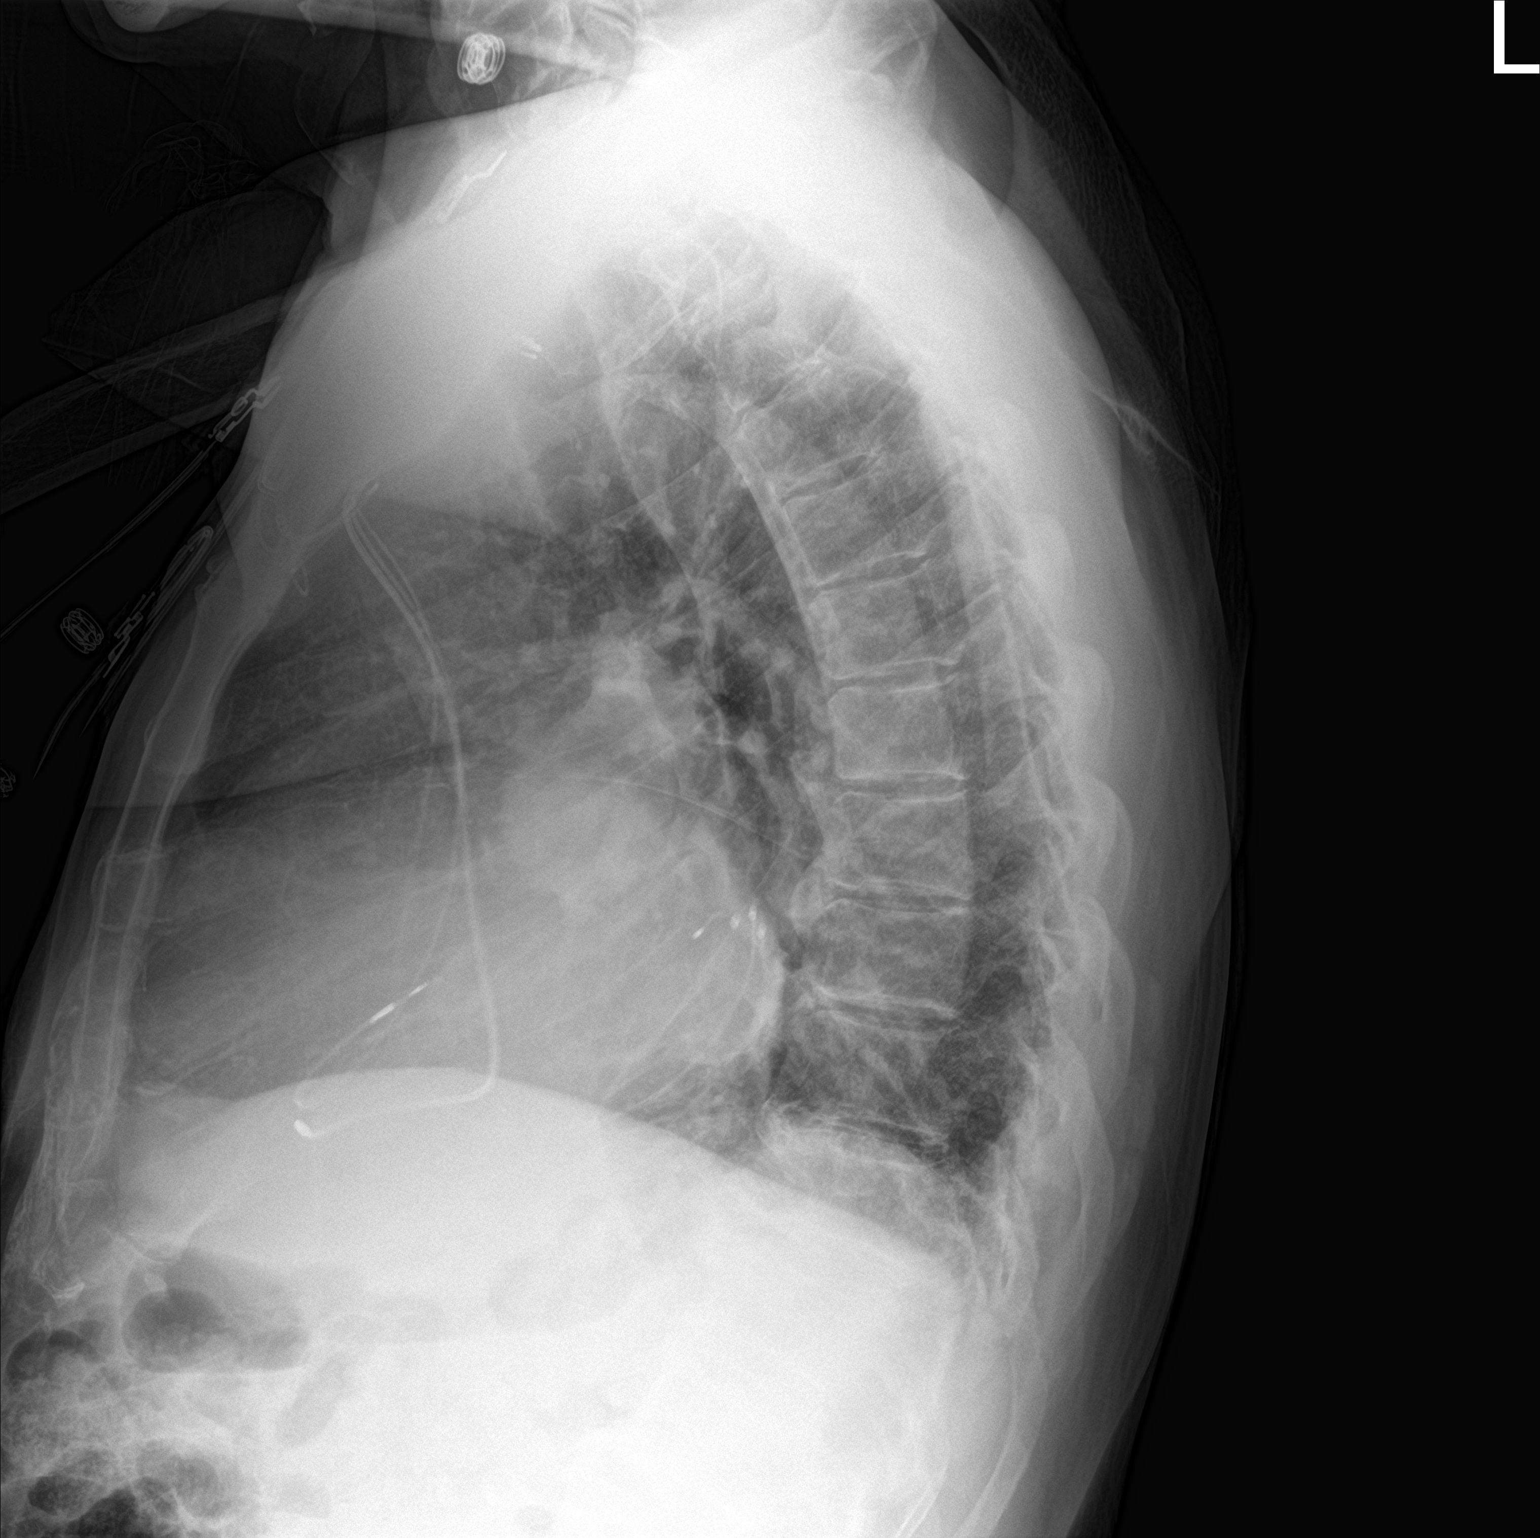

[2 of 2 positions shown; findings below may reference images not displayed]

FINDINGS: LEFT subclavian biventricular pacemaker unchanged and appears
intact. Cardiac silhouette markedly enlarged, unchanged. Thoracic
aorta atherosclerotic, unchanged. Hilar and mediastinal contours
otherwise unremarkable. Minimal atelectasis in the LEFT LOWER LOBE.
Lungs otherwise clear. Bronchovascular markings normal. No localized
airspace consolidation. No pleural effusions. No pneumothorax.
Normal pulmonary vascularity. No pleural effusions. Degenerative
changes involving the thoracic and UPPER lumbar spine.
IMPRESSION: Stable marked cardiomegaly. Minimal atelectasis in the LEFT LOWER
LOBE. No acute cardiopulmonary disease otherwise.

## 2019-01-22 NOTE — Progress Notes (Signed)
Remote pacemaker transmission.   

## 2019-01-24 ENCOUNTER — Ambulatory Visit (HOSPITAL_COMMUNITY)
Admission: RE | Admit: 2019-01-24 | Discharge: 2019-01-24 | Disposition: A | Discharge status: Home / Self Care | Payer: No Typology Code available for payment source | Source: Ambulatory Visit | Attending: Cardiology | Admitting: Cardiology

## 2019-01-24 ENCOUNTER — Ambulatory Visit (HOSPITAL_BASED_OUTPATIENT_CLINIC_OR_DEPARTMENT_OTHER)
Admission: RE | Admit: 2019-01-24 | Discharge: 2019-01-24 | Disposition: A | Discharge status: Home / Self Care | Payer: No Typology Code available for payment source | Source: Ambulatory Visit | Attending: Internal Medicine | Admitting: Internal Medicine

## 2019-01-24 ENCOUNTER — Encounter (HOSPITAL_COMMUNITY): Payer: Self-pay

## 2019-01-24 ENCOUNTER — Other Ambulatory Visit: Payer: Self-pay

## 2019-01-24 VITALS — BP 136/90 | HR 73 | Wt 235.8 lb

## 2019-01-24 DIAGNOSIS — Z950 Presence of cardiac pacemaker: Secondary | ICD-10-CM | POA: Insufficient documentation

## 2019-01-24 DIAGNOSIS — J90 Pleural effusion, not elsewhere classified: Secondary | ICD-10-CM | POA: Insufficient documentation

## 2019-01-24 DIAGNOSIS — I482 Chronic atrial fibrillation, unspecified: Secondary | ICD-10-CM | POA: Insufficient documentation

## 2019-01-24 DIAGNOSIS — N62 Hypertrophy of breast: Secondary | ICD-10-CM | POA: Insufficient documentation

## 2019-01-24 DIAGNOSIS — Z7901 Long term (current) use of anticoagulants: Secondary | ICD-10-CM | POA: Insufficient documentation

## 2019-01-24 DIAGNOSIS — E785 Hyperlipidemia, unspecified: Secondary | ICD-10-CM | POA: Insufficient documentation

## 2019-01-24 DIAGNOSIS — Z8249 Family history of ischemic heart disease and other diseases of the circulatory system: Secondary | ICD-10-CM | POA: Insufficient documentation

## 2019-01-24 DIAGNOSIS — I447 Left bundle-branch block, unspecified: Secondary | ICD-10-CM

## 2019-01-24 DIAGNOSIS — I2510 Atherosclerotic heart disease of native coronary artery without angina pectoris: Secondary | ICD-10-CM | POA: Insufficient documentation

## 2019-01-24 DIAGNOSIS — E1122 Type 2 diabetes mellitus with diabetic chronic kidney disease: Secondary | ICD-10-CM | POA: Insufficient documentation

## 2019-01-24 DIAGNOSIS — N183 Chronic kidney disease, stage 3 unspecified: Secondary | ICD-10-CM

## 2019-01-24 DIAGNOSIS — I5022 Chronic systolic (congestive) heart failure: Secondary | ICD-10-CM | POA: Insufficient documentation

## 2019-01-24 DIAGNOSIS — E7800 Pure hypercholesterolemia, unspecified: Secondary | ICD-10-CM | POA: Insufficient documentation

## 2019-01-24 DIAGNOSIS — I130 Hypertensive heart and chronic kidney disease with heart failure and stage 1 through stage 4 chronic kidney disease, or unspecified chronic kidney disease: Secondary | ICD-10-CM | POA: Insufficient documentation

## 2019-01-24 DIAGNOSIS — I428 Other cardiomyopathies: Secondary | ICD-10-CM | POA: Insufficient documentation

## 2019-01-24 DIAGNOSIS — I4821 Permanent atrial fibrillation: Secondary | ICD-10-CM

## 2019-01-24 DIAGNOSIS — Z79899 Other long term (current) drug therapy: Secondary | ICD-10-CM | POA: Insufficient documentation

## 2019-01-24 LAB — CBC
HCT: 38.5 % — ABNORMAL LOW (ref 39.0–52.0)
Hemoglobin: 12.6 g/dL — ABNORMAL LOW (ref 13.0–17.0)
MCH: 31.5 pg (ref 26.0–34.0)
MCHC: 32.7 g/dL (ref 30.0–36.0)
MCV: 96.3 fL (ref 80.0–100.0)
Platelets: 154 10*3/uL (ref 150–400)
RBC: 4 MIL/uL — ABNORMAL LOW (ref 4.22–5.81)
RDW: 12.1 % (ref 11.5–15.5)
WBC: 4.8 10*3/uL (ref 4.0–10.5)
nRBC: 0 % (ref 0.0–0.2)

## 2019-01-24 LAB — BASIC METABOLIC PANEL
Anion gap: 9 (ref 5–15)
BUN: 30 mg/dL — ABNORMAL HIGH (ref 8–23)
CO2: 25 mmol/L (ref 22–32)
Calcium: 9.3 mg/dL (ref 8.9–10.3)
Chloride: 106 mmol/L (ref 98–111)
Creatinine, Ser: 1.26 mg/dL — ABNORMAL HIGH (ref 0.61–1.24)
GFR calc Af Amer: >60 mL/min (ref >60)
GFR calc non Af Amer: 57 mL/min — ABNORMAL LOW (ref >60)
Glucose, Bld: 89 mg/dL (ref 70–99)
Potassium: 4 mmol/L (ref 3.5–5.1)
Sodium: 140 mmol/L (ref 135–145)

## 2019-01-24 MED ORDER — LOSARTAN POTASSIUM 25 MG PO TABS: 12.5000 mg | ORAL_TABLET | Freq: Every day | ORAL | 3 refills | Status: DC

## 2019-01-24 NOTE — Telephone Encounter (Signed)
q 

## 2019-01-24 NOTE — Progress Notes (Addendum)
PCP: Dr Amil Amen  Cardiologist: Dr Philbert Riser Primary HF Cardiologist: Dr Vaughan Browner  Spanish Speaking EP : Dr Lovena Le.    HPI: Kyle Burke is a 73 y.o. male [place] speaking male with a history of  chronic systolic heart failure, NICM,  LBBB,  DMII, chroinc A fib, and chronic anticoagulation with xarelto. He does not have medical insurance. He is not a [place] citizen. He requires assistance with medications.  He has been followed by Dr Philbert Riser at [facility]. He was last seen 01/2018. He was stable at that time. Weight was 220 pounds.  Admitted with A/C systolic heart failure. Diuresed with IV lasix and once diuresed transitioned to lasix 40 mg twice a day. EP consulted for LBBB. He is being considered for charitable CRT-D. Discharge weight was 223 pounds.   Admitted 07/13/18 for scheduled CRT-P. He was discharged on 07/14/18 on higher dose of carvedilol and digoxin was started.    Seen in ED 08/13/18 with hematoma around his CRT device. Completed course of Keflex and followed up in ICD device clinic 08/19/2018.   Today he presents for HF follow up. Overall feeling fine. Denies SOB/PND/Orthopnea. Able push mow his grass without difficulty. Appetite ok. No fever or chills. Weight at home 229 pounds. No bleeding problems. Taking all medications but says he stopped losartan. He is not sure why losartan was stopped. There was a question about hypotension.    Cardiac Testing  ECHO 01/24/2019 EF 20-25%  Echo 06/2018- LVEF is further decreased to 15% with severe global hypokinesis, inferior akinesis and septal dyskinesis.RV mildly reduced.  ECHO 2017 [place]- EF 20-25% RV mildly dilated. Severe hypokinesis.  RHC/ LHC [facility] 2018 at [facility] Nonobs LAD 50%,mild om2.  RA 12 PCWP 31  Review of systems complete and found to be negative unless listed in HPI.   SH:  Social History   Socioeconomic History  . Marital status: Married    Spouse name: Not on file  . Number of children: 5  . Years of  education: 9  . Highest education level: Not on file  Occupational History  . Occupation: Ambulance person constrution    Comment: Barista  Social Needs  . Financial resource strain: Not on file  . Food insecurity:    Worry: Not on file    Inability: Not on file  . Transportation needs:    Medical: Not on file    Non-medical: Not on file  Tobacco Use  . Smoking status: Never Smoker  . Smokeless tobacco: Never Used  Substance and Sexual Activity  . Alcohol use: Yes    Alcohol/week: 0.0 standard drinks    Comment: 07/13/2018 "used to drink; none since 2015"  . Drug use: Never  . Sexual activity: Yes  Lifestyle  . Physical activity:    Days per week: Not on file    Minutes per session: Not on file  . Stress: Not on file  Relationships  . Social connections:    Talks on phone: Not on file    Gets together: Not on file    Attends religious service: Not on file    Active member of club or organization: Not on file    Attends meetings of clubs or organizations: Not on file    Relationship status: Not on file  . Intimate partner violence:    Fear of current or ex partner: Not on file    Emotionally abused: Not on file    Physically abused: Not on file    Forced sexual  activity: Not on file  Other Topics Concern  . Not on file  Social History Narrative   Originally from [place]   Came to [employer]. In 1995   Lives at home with wife, a son and his wife and 3 grandchildren.    FH:  Family History  Problem Relation Age of Onset  . Stroke Mother   . Heart disease Mother   . Heart disease Father   . Diabetes Father   . Kidney disease Father        , history of stones as well  . Diabetes Brother   . Cancer Sister 65       unknown soft tissue cancer of leg    Past Medical History:  Diagnosis Date  . Arthritis    "knees" (07/13/2018)  . Atrial fibrillation ([place]) 2016  . CAD (coronary artery disease)   . High cholesterol   . Hypertension 2006  . Idiopathic cardiomyopathy  ([place]) 11/01/2014   Followed by Dr. Acie Fredrickson, Cardiology  EF 25%  Adenosine Cardiolite did not support ischemic etiology  . OA (osteoarthritis) of knee 01/08/2017  . Pleural effusion   . Presence of permanent cardiac pacemaker 07/13/2018  . Refusal of blood transfusions as patient is Jehovah's Witness   . Type II diabetes mellitus ([place]) 2001    Current Outpatient Medications  Medication Sig Dispense Refill  . acetaminophen (TYLENOL) 650 MG CR tablet Take 650 mg by mouth every 8 (eight) hours as needed for pain. Reported on 02/14/2016    . atorvastatin (LIPITOR) 80 MG tablet Take 1 tablet (80 mg total) by mouth daily. 30 tablet 11  . Blood Glucose Monitoring Suppl (AGAMATRIX PRESTO PRO METER) DEVI Twice daily blood glucose checks before meals 1 Device 0  . carvedilol (COREG) 6.25 MG tablet Take 1 tablet (6.25 mg total) by mouth 2 (two) times daily with a meal. 60 tablet 6  . digoxin (LANOXIN) 0.125 MG tablet Take 1 tablet (125 mcg total) by mouth daily. 30 tablet 6  . ferrous gluconate (FERGON) 324 MG tablet Take 1 tablet (324 mg total) by mouth daily with breakfast.  3  . furosemide (LASIX) 40 MG tablet Take 1.5 tablets (60 mg total) by mouth every morning AND 1 tablet (40 mg total) every evening. 60 tablet 11  . glucose blood (AGAMATRIX PRESTO TEST) test strip Twice daily glucose checks before meals 100 each 12  . Glycerin-Hypromellose-PEG 400 ([employer] DRY EYE RELIEF) 0.2-0.2-1 % SOLN Place 2 drops into both eyes as needed.    . sildenafil (VIAGRA) 100 MG tablet 1/2 to 1 tab by mouth 1/2 hour before activity, only 1 tab max in 24 hours 10 tablet 11  . XARELTO 20 MG TABS tablet TAKE 1 TABLET BY MOUTH ONCE DAILY WITH SUPPER 30 tablet 11   No current facility-administered medications for this encounter.     Vitals:   01/24/19 1503  BP: 136/90  Pulse: 73  SpO2: 98%  Weight: 107 kg (235 lb 12.8 oz)   Wt Readings from Last 3 Encounters:  01/24/19 107 kg (235 lb 12.8 oz)  10/26/18 106 kg (233 lb  9.6 oz)  10/21/18 103.2 kg (227 lb 8 oz)   PHYSICAL EXAM: Spanish Interpreter present.  General:  Well appearing. No resp difficulty HEENT: normal Neck: supple. no JVD. Carotids 2+ bilat; no bruits. No lymphadenopathy or thryomegaly appreciated. Cor: PMI nondisplaced. Irregular rate & rhythm. No rubs, gallops or murmurs. Lungs: clear Abdomen: soft, nontender, nondistended. No hepatosplenomegaly. No bruits or  masses. Good bowel sounds. Extremities: no cyanosis, clubbing, rash, edema Neuro: alert & orientedx3, cranial nerves grossly intact. moves all 4 extremities w/o difficulty. Affect pleasant    ASSESSMENT & PLAN: 1. Chronic Systolic Heart Failure, NICM. LHC 2018 50% LAD - ECHO 06/2018 EF 15%  LA severely dilated. EF has been down for years.  Repeat ECHO today 01/24/19 ---->20-25%. I discussed ECHO results today.  - 07/13/2018 S/P St Jude CRT-P.  -NYHA I-II. Doing great.  - Volume status stable,.  - Continue carvedilol 6.25 mg twice a day  - Continue 0.125 mg digoxin daily.   -Restart losartan 12.5 mg at bed time. -Intolerant spiro/inspra due to gynecomastia.  -Check BMET today.  2. LBBB s/p St Jude CRT-P.  - Follows  With Dr Lovena Le. No change.  3. Chronic A fib - Continue xarelto 20 mg daily. Denies bleeding.  -Rate controlled.    -Check CBC today.  4. CKD Stage III - Cr 1.18 10/21/18. - Check BMET today.  5. DMII - Per PCP.   Follow up 6-8 weeks.   Kyle Grinder, NP  3:09 PM

## 2019-01-24 NOTE — Progress Notes (Signed)
  Echocardiogram 2D Echocardiogram has been performed.  Kyle Burke 01/24/2019, 2:57 PM

## 2019-01-24 NOTE — Patient Instructions (Signed)
START Spironolactone 12.5 mg, one half tab daily at bedtime  Labs today We will only contact you if something comes back abnormal or we need to make some changes. Otherwise no news is good news!  Labs needed in 7-10 days  Your physician recommends that you schedule a follow-up appointment in: 6-8 weeks with Amy Clegg,NP  Do the following things EVERYDAY: 1) Weigh yourself in the morning before breakfast. Write it down and keep it in a log. 2) Take your medicines as prescribed 3) Eat low salt foods-Limit salt (sodium) to 2000 mg per day.  4) Stay as active as you can everyday 5) Limit all fluids for the day to less than 2 liters   At the [facility], you and your health needs are our priority. As part of our continuing mission to provide you with exceptional heart care, we have created designated Provider Care Teams. These Care Teams include your primary Cardiologist (physician) and Advanced Practice Providers (APPs- Physician Assistants and Nurse Practitioners) who all work together to provide you with the care you need, when you need it.   You may see any of the following providers on your designated Care Team at your next follow up: Marland Kitchen Dr Glori Bickers . Dr Loralie Champagne . Darrick Grinder, NP

## 2019-01-31 ENCOUNTER — Other Ambulatory Visit: Payer: Self-pay

## 2019-01-31 DIAGNOSIS — N183 Chronic kidney disease, stage 3 unspecified: Secondary | ICD-10-CM

## 2019-01-31 DIAGNOSIS — E1122 Type 2 diabetes mellitus with diabetic chronic kidney disease: Secondary | ICD-10-CM

## 2019-02-01 LAB — HGB A1C W/O EAG: Hgb A1c MFr Bld: 5.7 % — ABNORMAL HIGH (ref 4.8–5.6)

## 2019-02-03 ENCOUNTER — Ambulatory Visit (HOSPITAL_COMMUNITY)
Admission: RE | Admit: 2019-02-03 | Discharge: 2019-02-03 | Disposition: A | Discharge status: Home / Self Care | Payer: No Typology Code available for payment source | Source: Ambulatory Visit | Attending: Internal Medicine | Admitting: Internal Medicine

## 2019-02-03 ENCOUNTER — Other Ambulatory Visit: Payer: Self-pay

## 2019-02-03 ENCOUNTER — Telehealth (HOSPITAL_COMMUNITY): Payer: Self-pay

## 2019-02-03 DIAGNOSIS — I5022 Chronic systolic (congestive) heart failure: Secondary | ICD-10-CM | POA: Insufficient documentation

## 2019-02-03 LAB — BASIC METABOLIC PANEL
Anion gap: 7 (ref 5–15)
BUN: 25 mg/dL — ABNORMAL HIGH (ref 8–23)
CO2: 28 mmol/L (ref 22–32)
Calcium: 9.2 mg/dL (ref 8.9–10.3)
Chloride: 104 mmol/L (ref 98–111)
Creatinine, Ser: 1.25 mg/dL — ABNORMAL HIGH (ref 0.61–1.24)
GFR calc Af Amer: >60 mL/min (ref >60)
GFR calc non Af Amer: 58 mL/min — ABNORMAL LOW (ref >60)
Glucose, Bld: 109 mg/dL — ABNORMAL HIGH (ref 70–99)
Potassium: 4 mmol/L (ref 3.5–5.1)
Sodium: 139 mmol/L (ref 135–145)

## 2019-02-03 NOTE — Telephone Encounter (Signed)
Pt here in office for lab appt, results of echo given. Pt appreciative.

## 2019-02-03 NOTE — Telephone Encounter (Signed)
-----   Message from Larey Dresser, MD sent at 01/25/2019  4:06 PM EDT ----- EF 20-25%, report to patient.

## 2019-02-17 ENCOUNTER — Other Ambulatory Visit: Payer: Self-pay

## 2019-02-17 MED ORDER — CARVEDILOL 6.25 MG PO TABS: 6.2500 mg | ORAL_TABLET | Freq: Two times a day (BID) | ORAL | 6 refills | Status: DC

## 2019-02-24 ENCOUNTER — Other Ambulatory Visit: Payer: Self-pay

## 2019-02-24 ENCOUNTER — Ambulatory Visit: Payer: Self-pay | Admitting: Internal Medicine

## 2019-02-24 VITALS — BP 106/70 | HR 89 | Temp 96.5°F | Ht 75.0 in | Wt 234.0 lb

## 2019-02-24 DIAGNOSIS — K4090 Unilateral inguinal hernia, without obstruction or gangrene, not specified as recurrent: Secondary | ICD-10-CM

## 2019-02-24 DIAGNOSIS — N183 Chronic kidney disease, stage 3 (moderate): Secondary | ICD-10-CM

## 2019-02-24 DIAGNOSIS — E1122 Type 2 diabetes mellitus with diabetic chronic kidney disease: Secondary | ICD-10-CM

## 2019-02-24 DIAGNOSIS — E785 Hyperlipidemia, unspecified: Secondary | ICD-10-CM

## 2019-02-24 DIAGNOSIS — I10 Essential (primary) hypertension: Secondary | ICD-10-CM

## 2019-02-24 DIAGNOSIS — I482 Chronic atrial fibrillation, unspecified: Secondary | ICD-10-CM

## 2019-02-24 NOTE — Progress Notes (Signed)
Subjective:    Patient ID: Kyle Burke, male   DOB: 06/27/1946, 73 y.o.   MRN: [id number]   HPI   1.  Cardiomyopathy/chronic afib with biventricular pacemaker placement 07/13/2018:  Doing quite well since pacemaker placement.  Good energy.  No palpitations.  No dyspnea or chest pain. No swelling of ankles Lipids at goal in March 2020  2.  DM:  Now diet controlled.  Last A1C was up a bit to 5.7%, but excellent control off meds. Has not had his pneumococcal 13 vaccine Have asked him to obtain this vaccine at [facility] Has not had eye check in past year.  Referral ordered in March, but on hold with pandemic with orange card at this time.  3.  Hypertension:   Well controlled.  4.  Left Inguinal Hernia:  Saw Dr. Redmond Pulling for this, but on hold until underwent pacemaker placement and stabilized enough.  Has an appt in 2 weeks with Cardiology and will ask if ok to proceed then  Current Meds  Medication Sig  . acetaminophen (TYLENOL) 650 MG CR tablet Take 650 mg by mouth every 8 (eight) hours as needed for pain. Reported on 02/14/2016  . atorvastatin (LIPITOR) 80 MG tablet Take 1 tablet (80 mg total) by mouth daily.  . Blood Glucose Monitoring Suppl (AGAMATRIX PRESTO PRO METER) DEVI Twice daily blood glucose checks before meals  . carvedilol (COREG) 6.25 MG tablet Take 1 tablet (6.25 mg total) by mouth 2 (two) times daily with a meal.  . ferrous gluconate (FERGON) 324 MG tablet Take 1 tablet (324 mg total) by mouth daily with breakfast.  . furosemide (LASIX) 40 MG tablet Take 1.5 tablets (60 mg total) by mouth every morning AND 1 tablet (40 mg total) every evening.  Marland Kitchen glucose blood (AGAMATRIX PRESTO TEST) test strip Twice daily glucose checks before meals  . Glycerin-Hypromellose-PEG 400 ([employer] DRY EYE RELIEF) 0.2-0.2-1 % SOLN Place 2 drops into both eyes as needed.  Marland Kitchen losartan (COZAAR) 25 MG tablet Take 0.5 tablets (12.5 mg total) by mouth at bedtime.  . sildenafil (VIAGRA) 100 MG tablet  1/2 to 1 tab by mouth 1/2 hour before activity, only 1 tab max in 24 hours  . XARELTO 20 MG TABS tablet TAKE 1 TABLET BY MOUTH ONCE DAILY WITH SUPPER   Allergies  Allergen Reactions  . Spironolactone Other (See Comments)    Gynecomastia     Review of Systems    Objective:   BP 106/70 (BP Location: Left Arm, Patient Position: Sitting, Cuff Size: Normal)   Pulse 89   Temp (!) 96.5 F (35.8 C)   Ht 6\' 3"  (1.905 m)   Wt 234 lb (106.1 kg)   BMI 29.25 kg/m   Physical Exam  NAD HEENT:  PERRL, EOMI,  Neck:  Supple, No adenopathy, no thyromegaly Chest:  CTA CV:  RRR without murmur or rub.  Carotid, radial and DP pulses normal and equal Abd:  S, NT, No HSM or mass, + BS LE:  No edema   Assessment & Plan  1.  Chronic afib with cardiomyopathy and biventricular pacemaker:  Doing quite well since pacemaker placement.  Remains on Xarelto for afib and cardiomyopathy.  2.  DM:  Diet controlled.  Also doing well off meds.    3.  Dyslipidemia:  Controlled on statin.  4.  Hypertension:  Controlled on Carvedilol  5.  HM:  Encouraged obtaining Pneumococcal 13 at [facility] as we do not have available.  Particularly with  COVID 19 pandemic and risk factors.

## 2019-02-24 NOTE — Patient Instructions (Signed)
Need Pneumococcal 13.  Please call the public health dept for this.

## 2019-03-03 ENCOUNTER — Other Ambulatory Visit: Payer: Self-pay

## 2019-03-03 MED ORDER — DIGOXIN 125 MCG PO TABS: 125.0000 ug | ORAL_TABLET | Freq: Every day | ORAL | 6 refills | Status: DC

## 2019-03-06 NOTE — Progress Notes (Signed)
PCP: Dr Amil Amen  Cardiologist: Dr Philbert Riser Primary HF Cardiologist: Dr Vaughan Browner  EP : Dr Lovena Le.   HPI: Kyle Burke is a 73 y.o. male [place] speaking male with a history of  chronic systolic heart failure, NICM,  LBBB,  DMII, chroinc A fib, and chronic anticoagulation with xarelto. He does not have medical insurance. He is not a [place] citizen. He requires assistance with medications.  He has been followed by Dr Philbert Riser at [facility]. He was last seen 01/2018. He was stable at that time. Weight was 220 pounds.  Admitted with A/C systolic heart failure. Diuresed with IV lasix and once diuresed transitioned to lasix 40 mg twice a day. EP consulted for LBBB. He is being considered for charitable CRT-D. Discharge weight was 223 pounds.   Admitted 07/13/18 for scheduled CRT-P. He was discharged on 07/14/18 on higher dose of carvedilol and digoxin was started.    Seen in ED 08/13/18 with hematoma around his CRT device. Completed course of Keflex and followed up in ICD device clinic 08/19/2018.   Today he returns for HF follow up. Last visit losartan was added. Overall feeling fine and has more energy.  Denies SOB/PND/Orthopnea. Able to push mow his grass. No bleeding issues. Appetite ok. No fever or chills. Weight at home has been stable. Taking all medications.   Cardiac Testing  ECHO 01/24/2019 EF 20-25%  Echo 06/2018- LVEF is further decreased to 15% with severe global hypokinesis, inferior akinesis and septal dyskinesis.RV mildly reduced.  ECHO 2017 [place]- EF 20-25% RV mildly dilated. Severe hypokinesis.  RHC/ LHC [facility] 2018 at [facility] Nonobs LAD 50%,mild om2.  RA 12 PCWP 31  Review of systems complete and found to be negative unless listed in HPI.   SH:  Social History   Socioeconomic History  . Marital status: Married    Spouse name: Not on file  . Number of children: 5  . Years of education: 9  . Highest education level: Not on file  Occupational History  . Occupation:  Ambulance person constrution    Comment: Barista  Social Needs  . Financial resource strain: Not on file  . Food insecurity    Worry: Not on file    Inability: Not on file  . Transportation needs    Medical: Not on file    Non-medical: Not on file  Tobacco Use  . Smoking status: Never Smoker  . Smokeless tobacco: Never Used  Substance and Sexual Activity  . Alcohol use: Yes    Alcohol/week: 0.0 standard drinks    Comment: 07/13/2018 "used to drink; none since 2015"  . Drug use: Never  . Sexual activity: Yes  Lifestyle  . Physical activity    Days per week: Not on file    Minutes per session: Not on file  . Stress: Not on file  Relationships  . Social Herbalist on phone: Not on file    Gets together: Not on file    Attends religious service: Not on file    Active member of club or organization: Not on file    Attends meetings of clubs or organizations: Not on file    Relationship status: Not on file  . Intimate partner violence    Fear of current or ex partner: Not on file    Emotionally abused: Not on file    Physically abused: Not on file    Forced sexual activity: Not on file  Other Topics Concern  . Not on file  Social History Narrative   Originally from [place]   Came to [place] In 1995   Lives at home with wife, a son and his wife and 3 grandchildren.    FH:  Family History  Problem Relation Age of Onset  . Stroke Mother   . Heart disease Mother   . Heart disease Father   . Diabetes Father   . Kidney disease Father        , history of stones as well  . Diabetes Brother   . Cancer Sister 36       unknown soft tissue cancer of leg    Past Medical History:  Diagnosis Date  . Arthritis    "knees" (07/13/2018)  . Atrial fibrillation ([place]) 2016  . CAD (coronary artery disease)   . High cholesterol   . Hypertension 2006  . Idiopathic cardiomyopathy ([place]) 11/01/2014   Followed by Dr. Acie Fredrickson, Cardiology  EF 25%  Adenosine Cardiolite did not support  ischemic etiology  . OA (osteoarthritis) of knee 01/08/2017  . Pleural effusion   . Presence of permanent cardiac pacemaker 07/13/2018  . Refusal of blood transfusions as patient is Jehovah's Witness   . Type II diabetes mellitus ([place]) 2001    Current Outpatient Medications  Medication Sig Dispense Refill  . acetaminophen (TYLENOL) 650 MG CR tablet Take 650 mg by mouth every 8 (eight) hours as needed for pain. Reported on 02/14/2016    . atorvastatin (LIPITOR) 80 MG tablet Take 1 tablet (80 mg total) by mouth daily. 30 tablet 11  . Blood Glucose Monitoring Suppl (AGAMATRIX PRESTO PRO METER) DEVI Twice daily blood glucose checks before meals 1 Device 0  . carvedilol (COREG) 6.25 MG tablet Take 1 tablet (6.25 mg total) by mouth 2 (two) times daily with a meal. 60 tablet 6  . digoxin (LANOXIN) 0.125 MG tablet Take 1 tablet (125 mcg total) by mouth daily. 30 tablet 6  . ferrous gluconate (FERGON) 324 MG tablet Take 1 tablet (324 mg total) by mouth daily with breakfast.  3  . furosemide (LASIX) 40 MG tablet Take 1.5 tablets (60 mg total) by mouth every morning AND 1 tablet (40 mg total) every evening. 60 tablet 11  . glucose blood (AGAMATRIX PRESTO TEST) test strip Twice daily glucose checks before meals 100 each 12  . Glycerin-Hypromellose-PEG 400 ([facility] DRY EYE RELIEF) 0.2-0.2-1 % SOLN Place 2 drops into both eyes as needed.    Marland Kitchen losartan (COZAAR) 25 MG tablet Take 0.5 tablets (12.5 mg total) by mouth at bedtime. 45 tablet 3  . sildenafil (VIAGRA) 100 MG tablet 1/2 to 1 tab by mouth 1/2 hour before activity, only 1 tab max in 24 hours 10 tablet 11  . XARELTO 20 MG TABS tablet TAKE 1 TABLET BY MOUTH ONCE DAILY WITH SUPPER 30 tablet 11   No current facility-administered medications for this encounter.     Vitals:   03/07/19 0923  BP: (!) 142/80  Pulse: 97  SpO2: 97%  Weight: 108 kg (238 lb 3.2 oz)   Wt Readings from Last 3 Encounters:  03/07/19 108 kg (238 lb 3.2 oz)  02/24/19 106.1 kg (234  lb)  01/24/19 107 kg (235 lb 12.8 oz)   PHYSICAL EXAM: Spanish interpreter present.  General:  Well appearing. No resp difficulty HEENT: normal Neck: supple. no JVD. Carotids 2+ bilat; no bruits. No lymphadenopathy or thryomegaly appreciated. Cor: PMI nondisplaced. Regular rate & rhythm. No rubs, gallops or murmurs. Lungs: clear Abdomen: soft, nontender, nondistended.  No hepatosplenomegaly. No bruits or masses. Good bowel sounds. Extremities: no cyanosis, clubbing, rash, edema Neuro: alert & orientedx3, cranial nerves grossly intact. moves all 4 extremities w/o difficulty. Affect pleasant  ASSESSMENT & PLAN: 1. Chronic Systolic Heart Failure, NICM. LHC 2018 50% LAD.  - ECHO 06/2018 EF 15%  LA severely dilated. EF has been down for years.  -  ECHO today 01/24/19 ---->20-25%.  07/13/2018 S/P St Jude CRT-P.  -NYHA I-II. Volume status stable.  - Volume status stable.  - Increase carvedilol 9.375 mg twice a day.   - Continue 0.125 mg digoxin daily.   -Increase losartan 25 mg at bed time. Check BMET in 14 days.  -Intolerant spiro/inspra due to gynecomastia.  2. LBBB s/p St Jude CRT-P.  - Follows  With Dr Lovena Le.   Interrogation on high output mode this was turned off by ST jude rep to preserve battery. e.  3. Chronic A fib - Continue xarelto 20 mg daily. Denies bleeding.  -Rate controlled.    4. CKD Stage III -I reviewed BMET from 01/2019. Renal function was stable.  5. DMII - Per PCP.   Follow up in 3 months. Check BMEt in 14 days.    Darrick Grinder, NP  9:46 AM

## 2019-03-07 ENCOUNTER — Ambulatory Visit (HOSPITAL_COMMUNITY)
Admission: RE | Admit: 2019-03-07 | Discharge: 2019-03-07 | Disposition: A | Discharge status: Home / Self Care | Payer: Self-pay | Source: Ambulatory Visit | Attending: Internal Medicine | Admitting: Internal Medicine

## 2019-03-07 ENCOUNTER — Encounter (HOSPITAL_COMMUNITY): Payer: Self-pay

## 2019-03-07 ENCOUNTER — Other Ambulatory Visit: Payer: Self-pay

## 2019-03-07 VITALS — BP 142/80 | HR 97 | Wt 238.2 lb

## 2019-03-07 DIAGNOSIS — N183 Chronic kidney disease, stage 3 unspecified: Secondary | ICD-10-CM

## 2019-03-07 DIAGNOSIS — Z7901 Long term (current) use of anticoagulants: Secondary | ICD-10-CM | POA: Insufficient documentation

## 2019-03-07 DIAGNOSIS — I429 Cardiomyopathy, unspecified: Secondary | ICD-10-CM | POA: Insufficient documentation

## 2019-03-07 DIAGNOSIS — M1710 Unilateral primary osteoarthritis, unspecified knee: Secondary | ICD-10-CM | POA: Insufficient documentation

## 2019-03-07 DIAGNOSIS — I447 Left bundle-branch block, unspecified: Secondary | ICD-10-CM

## 2019-03-07 DIAGNOSIS — Z950 Presence of cardiac pacemaker: Secondary | ICD-10-CM | POA: Insufficient documentation

## 2019-03-07 DIAGNOSIS — I4821 Permanent atrial fibrillation: Secondary | ICD-10-CM

## 2019-03-07 DIAGNOSIS — I2510 Atherosclerotic heart disease of native coronary artery without angina pectoris: Secondary | ICD-10-CM | POA: Insufficient documentation

## 2019-03-07 DIAGNOSIS — E1122 Type 2 diabetes mellitus with diabetic chronic kidney disease: Secondary | ICD-10-CM | POA: Insufficient documentation

## 2019-03-07 DIAGNOSIS — E7800 Pure hypercholesterolemia, unspecified: Secondary | ICD-10-CM | POA: Insufficient documentation

## 2019-03-07 DIAGNOSIS — Z79899 Other long term (current) drug therapy: Secondary | ICD-10-CM | POA: Insufficient documentation

## 2019-03-07 DIAGNOSIS — Z8249 Family history of ischemic heart disease and other diseases of the circulatory system: Secondary | ICD-10-CM | POA: Insufficient documentation

## 2019-03-07 DIAGNOSIS — I5022 Chronic systolic (congestive) heart failure: Secondary | ICD-10-CM

## 2019-03-07 DIAGNOSIS — I130 Hypertensive heart and chronic kidney disease with heart failure and stage 1 through stage 4 chronic kidney disease, or unspecified chronic kidney disease: Secondary | ICD-10-CM | POA: Insufficient documentation

## 2019-03-07 DIAGNOSIS — I428 Other cardiomyopathies: Secondary | ICD-10-CM | POA: Insufficient documentation

## 2019-03-07 DIAGNOSIS — I482 Chronic atrial fibrillation, unspecified: Secondary | ICD-10-CM | POA: Insufficient documentation

## 2019-03-07 DIAGNOSIS — Z809 Family history of malignant neoplasm, unspecified: Secondary | ICD-10-CM | POA: Insufficient documentation

## 2019-03-07 DIAGNOSIS — N62 Hypertrophy of breast: Secondary | ICD-10-CM | POA: Insufficient documentation

## 2019-03-07 DIAGNOSIS — Z833 Family history of diabetes mellitus: Secondary | ICD-10-CM | POA: Insufficient documentation

## 2019-03-07 MED ORDER — CARVEDILOL 6.25 MG PO TABS: 9.3750 mg | ORAL_TABLET | Freq: Two times a day (BID) | ORAL | 6 refills | Status: DC

## 2019-03-07 MED ORDER — LOSARTAN POTASSIUM 25 MG PO TABS: 25.0000 mg | ORAL_TABLET | Freq: Every day | ORAL | 6 refills | Status: DC

## 2019-03-07 NOTE — Patient Instructions (Addendum)
INCREASE Losartan to 25mg  (1 tab) daily  INCREASE Carvedilol to 9.375mg  (1.5 tabs) twice a day  Labs in 2 weeks We will only contact you if something comes back abnormal or we need to make some changes. Otherwise no news is good news!  Your physician recommends that you schedule a follow-up appointment in: 3 months with Amy, Nurse Practitioner  At the [facility], you and your health needs are our priority. As part of our continuing mission to provide you with exceptional heart care, we have created designated Provider Care Teams. These Care Teams include your primary Cardiologist (physician) and Advanced Practice Providers (APPs- Physician Assistants and Nurse Practitioners) who all work together to provide you with the care you need, when you need it.   You may see any of the following providers on your designated Care Team at your next follow up: Marland Kitchen Dr Glori Bickers . Dr Loralie Champagne . Darrick Grinder, NP

## 2019-03-07 NOTE — Addendum Note (Signed)
Encounter addended by: Valeda Malm, RN on: 03/07/2019 10:10 AM  Actions taken: Clinical Note Signed

## 2019-03-21 ENCOUNTER — Ambulatory Visit (HOSPITAL_COMMUNITY)
Admission: RE | Admit: 2019-03-21 | Discharge: 2019-03-21 | Disposition: A | Discharge status: Home / Self Care | Payer: Self-pay | Source: Ambulatory Visit | Attending: Internal Medicine | Admitting: Internal Medicine

## 2019-03-21 ENCOUNTER — Other Ambulatory Visit: Payer: Self-pay

## 2019-03-21 DIAGNOSIS — I5022 Chronic systolic (congestive) heart failure: Secondary | ICD-10-CM | POA: Insufficient documentation

## 2019-03-21 LAB — BASIC METABOLIC PANEL
Anion gap: 9 (ref 5–15)
BUN: 24 mg/dL — ABNORMAL HIGH (ref 8–23)
CO2: 25 mmol/L (ref 22–32)
Calcium: 9.1 mg/dL (ref 8.9–10.3)
Chloride: 105 mmol/L (ref 98–111)
Creatinine, Ser: 1.23 mg/dL (ref 0.61–1.24)
GFR calc Af Amer: >60 mL/min (ref >60)
GFR calc non Af Amer: 59 mL/min — ABNORMAL LOW (ref >60)
Glucose, Bld: 109 mg/dL — ABNORMAL HIGH (ref 70–99)
Potassium: 4.3 mmol/L (ref 3.5–5.1)
Sodium: 139 mmol/L (ref 135–145)

## 2019-03-22 ENCOUNTER — Telehealth: Payer: Self-pay

## 2019-03-22 NOTE — Telephone Encounter (Signed)
Virtual Visit Pre-Appointment Phone Call  TELEPHONE CALL NOTE  Jacai Romeo Rabon has been deemed a candidate for a follow-up tele-health visit to limit community exposure during the Covid-19 pandemic. I spoke with the patient via phone to ensure availability of phone/video source, confirm preferred email & phone number, and discuss instructions and expectations.  I reminded Imanuel Pruiett to be prepared with any vital sign and/or heart rhythm information that could potentially be obtained via home monitoring, at the time of his visit. I reminded Ariston Grandison to expect a phone call prior to his visit.  Patient agrees to consent below. Patient understands to have vitals, meds, and weight ready for call 30 minutes prior to appointment. Patient understands that he will be called twice 1st around 9:00 by the RN and interpreter and 2nd around 9:40 by the MD and interpreter. Patient would like to be called on [phone] for his virtual appt tomorrow. Patient verbalized understanding and thanked me for the call.  Cleon Gustin, RN 03/22/2019 3:30 PM   FULL LENGTH CONSENT FOR TELE-HEALTH VISIT   I hereby voluntarily request, consent and authorize [facility] and its employed or contracted physicians, physician assistants, nurse practitioners or other licensed health care professionals (the Practitioner), to provide me with telemedicine health care services (the "Services") as deemed necessary by the treating Practitioner. I acknowledge and consent to receive the Services by the Practitioner via telemedicine. I understand that the telemedicine visit will involve communicating with the Practitioner through live audiovisual communication technology and the disclosure of certain medical information by electronic transmission. I acknowledge that I have been given the opportunity to request an in-person assessment or other available alternative prior to the telemedicine visit and am  voluntarily participating in the telemedicine visit.  I understand that I have the right to withhold or withdraw my consent to the use of telemedicine in the course of my care at any time, without affecting my right to future care or treatment, and that the Practitioner or I may terminate the telemedicine visit at any time. I understand that I have the right to inspect all information obtained and/or recorded in the course of the telemedicine visit and may receive copies of available information for a reasonable fee.  I understand that some of the potential risks of receiving the Services via telemedicine include:  Marland Kitchen Delay or interruption in medical evaluation due to technological equipment failure or disruption; . Information transmitted may not be sufficient (e.g. poor resolution of images) to allow for appropriate medical decision making by the Practitioner; and/or  . In rare instances, security protocols could fail, causing a breach of personal health information.  Furthermore, I acknowledge that it is my responsibility to provide information about my medical history, conditions and care that is complete and accurate to the best of my ability. I acknowledge that Practitioner's advice, recommendations, and/or decision may be based on factors not within their control, such as incomplete or inaccurate data provided by me or distortions of diagnostic images or specimens that may result from electronic transmissions. I understand that the practice of medicine is not an exact science and that Practitioner makes no warranties or guarantees regarding treatment outcomes. I acknowledge that I will receive a copy of this consent concurrently upon execution via email to the email address I last provided but may also request a printed copy by calling the office of [place].    I understand that my insurance will be billed for this  visit.   I have read or had this consent read to me. . I understand the  contents of this consent, which adequately explains the benefits and risks of the Services being provided via telemedicine.  . I have been provided ample opportunity to ask questions regarding this consent and the Services and have had my questions answered to my satisfaction. . I give my informed consent for the services to be provided through the use of telemedicine in my medical care  By participating in this telemedicine visit I agree to the above.

## 2019-03-23 ENCOUNTER — Other Ambulatory Visit: Payer: Self-pay

## 2019-03-23 ENCOUNTER — Telehealth: Payer: Self-pay | Admitting: Cardiovascular Disease

## 2019-03-28 ENCOUNTER — Other Ambulatory Visit: Payer: Self-pay | Admitting: Internal Medicine

## 2019-04-13 ENCOUNTER — Ambulatory Visit (INDEPENDENT_AMBULATORY_CARE_PROVIDER_SITE_OTHER): Payer: No Typology Code available for payment source | Admitting: *Deleted

## 2019-04-13 DIAGNOSIS — I5022 Chronic systolic (congestive) heart failure: Secondary | ICD-10-CM

## 2019-04-13 LAB — CUP PACEART REMOTE DEVICE CHECK
Battery Remaining Longevity: 78 mo
Battery Remaining Percentage: 95.5 %
Battery Voltage: 2.99 V
Brady Statistic AP VP Percent: 96 %
Brady Statistic AP VS Percent: 0 %
Brady Statistic AS VP Percent: 1 %
Brady Statistic AS VS Percent: 1 %
Brady Statistic RA Percent Paced: 93 %
Date Time Interrogation Session: 20200825060009
Implantable Lead Implant Date: 20191126
Implantable Lead Implant Date: 20191126
Implantable Lead Implant Date: 20191126
Implantable Lead Location: 753858
Implantable Lead Location: 753860
Implantable Lead Location: 753860
Implantable Lead Model: 3830
Implantable Pulse Generator Implant Date: 20191126
Lead Channel Impedance Value: 560 Ohm
Lead Channel Impedance Value: 600 Ohm
Lead Channel Impedance Value: 660 Ohm
Lead Channel Pacing Threshold Amplitude: 0.75 V
Lead Channel Pacing Threshold Amplitude: 1 V
Lead Channel Pacing Threshold Amplitude: 1.25 V
Lead Channel Pacing Threshold Pulse Width: 0.5 ms
Lead Channel Pacing Threshold Pulse Width: 0.5 ms
Lead Channel Pacing Threshold Pulse Width: 1 ms
Lead Channel Sensing Intrinsic Amplitude: 0.8 mV
Lead Channel Sensing Intrinsic Amplitude: 5.7 mV
Lead Channel Setting Pacing Amplitude: 2 V
Lead Channel Setting Pacing Amplitude: 2 V
Lead Channel Setting Pacing Amplitude: 2.5 V
Lead Channel Setting Pacing Pulse Width: 0.5 ms
Lead Channel Setting Pacing Pulse Width: 0.5 ms
Lead Channel Setting Sensing Sensitivity: 0.7 mV
Pulse Gen Serial Number: 9060242

## 2019-04-22 ENCOUNTER — Encounter: Payer: Self-pay | Admitting: Cardiology

## 2019-04-22 NOTE — Progress Notes (Signed)
Remote pacemaker transmission.   

## 2019-04-29 ENCOUNTER — Telehealth: Payer: Self-pay | Admitting: Internal Medicine

## 2019-04-29 NOTE — Telephone Encounter (Signed)
Error

## 2019-05-05 NOTE — Progress Notes (Deleted)
{Choose 1 Note Type (Telehealth Visit or Telephone Visit):[phone]}   Date:  05/05/2019   ID:  Kyle Burke, DOB May 24, 1946, MRN [id number]  {Patient Location:[phone]::"Home"} {Provider [phone]::"Home"}  PCP:  Mack Hook, MD  Cardiologist:  Mertie Moores, MD *** Electrophysiologist:  Cristopher Peru, MD  Advanced Heart Failure Clinic:  Glori Bickers, MD     Evaluation Performed:  {Choose Visit [id number] Visit"}  Chief Complaint:  ***  History of Present Illness:    Kyle Burke is a 73 y.o. male with ***  Chronic systolic CHF  EF [id number] (echo 01/2019)  Nonischemic cardiomyopathy  S/p CRT-P 06/2018  Permanent atrial fibrillation  Anticoagulation: Xarelto  LBBB  Coronary artery disease (non obs by cath in 2018)  Diabetes mellitus type 2  Hypertension  Hyperlipidemia  He was previously managed by Dr. Acie Fredrickson and then followed by Dr. Philbert Riser at [facility].  Since November 2019.  He has followed with Dr. Haroldine Laws advanced heart failure clinic.  He was last seen in July 2020.  ***  The patient {does/does not:200015} have symptoms concerning for COVID-19 infection (fever, chills, cough, or new shortness of breath).    Past Medical History:  Diagnosis Date  . Arthritis    "knees" (07/13/2018)  . Atrial fibrillation ([place]) 2016  . CAD (coronary artery disease)   . High cholesterol   . Hypertension 2006  . Idiopathic cardiomyopathy ([place]) 11/01/2014   Followed by Dr. Acie Fredrickson, Cardiology  EF 25%  Adenosine Cardiolite did not support ischemic etiology  . OA (osteoarthritis) of knee 01/08/2017  . Pleural effusion   . Presence of permanent cardiac pacemaker 07/13/2018  . Refusal of blood transfusions as patient is Jehovah's Witness   . Type II diabetes mellitus ([place]) 2001   Past Surgical History:  Procedure Laterality Date  . BI-VENTRICULAR PACEMAKER INSERTION (CRT-P)  07/13/2018  . BIV PACEMAKER INSERTION  CRT-P N/A 07/13/2018   Procedure: BIV PACEMAKER INSERTION CRT-P;  Surgeon: Evans Lance, MD;  Location: [place] [facility];  Service: Cardiovascular;  Laterality: N/A;  . COLONOSCOPY WITH PROPOFOL N/A 05/14/2016   Procedure: COLONOSCOPY WITH PROPOFOL;  Surgeon: Jerene Bears, MD;  Location: [facility];  Service: Gastroenterology;  Laterality: N/A;  . ESOPHAGOGASTRODUODENOSCOPY (EGD) WITH PROPOFOL N/A 05/14/2016   Procedure: ESOPHAGOGASTRODUODENOSCOPY (EGD) WITH PROPOFOL;  Surgeon: Jerene Bears, MD;  Location: [facility];  Service: Gastroenterology;  Laterality: N/A;  . HERNIA REPAIR    . INCISE AND DRAIN ABCESS Left 1986   on leg  . INGUINAL HERNIA REPAIR  01/16/2012   Procedure: LAPAROSCOPIC INGUINAL HERNIA;  Surgeon: Gayland Curry, MD,FACS;  Location: [facility];  Service: General;  Laterality: Left;  Laparoscopic incarcerated Left inguinal hernia repair with mesh, umbilical hernia repair  . UMBILICAL HERNIA REPAIR  01/16/2012   Procedure: HERNIA REPAIR UMBILICAL ADULT;  Surgeon: Gayland Curry, MD,FACS;  Location: [facility];  Service: General;  Laterality: N/A;     No outpatient medications have been marked as taking for the 05/06/19 encounter (Appointment) with Richardson Dopp T, PA-C.     Allergies:   Spironolactone   Social History   Tobacco Use  . Smoking status: Never Smoker  . Smokeless tobacco: Never Used  Substance Use Topics  . Alcohol use: Yes    Alcohol/week: 0.0 standard drinks    Comment: 07/13/2018 "used to drink; none since 2015"  . Drug use: Never     Family Hx: The patient's family history includes Cancer (age of onset: 91) in his sister;  Diabetes in his brother and father; Heart disease in his father and mother; Kidney disease in his father; Stroke in his mother.  ROS:   Please see the history of present illness.    *** All other systems reviewed and are negative.   Prior CV studies:   The following studies were reviewed today:  *** Echocardiogram 01/24/2019 EF  20-25, moderate LVH, normal RV SF, severe LAE, moderate RAE, mild AI  Myoview 11/01/2014 No scar or ischemia, EF 22  Cardiac catheterization 08/25/2016 ([facility]) Nonobstructive CAD  Labs/Other Tests and Data Reviewed:    EKG:  {EKG/Telemetry Strips Reviewed:[phone]}  Recent Labs: 06/18/2018: B Natriuretic Peptide >4,500.0; TSH 2.355 06/21/2018: Magnesium 2.2 10/21/2018: ALT 24 01/24/2019: Hemoglobin 12.6; Platelets 154 03/21/2019: BUN 24; Creatinine, Ser 1.23; Potassium 4.3; Sodium 139   Recent Lipid Panel Lab Results  Component Value Date/Time   CHOL 112 10/21/2018 09:06 AM   TRIG 53 10/21/2018 09:06 AM   HDL 50 10/21/2018 09:06 AM   CHOLHDL 4.0 09/15/2014 10:37 AM   LDLCALC 51 10/21/2018 09:06 AM    Wt Readings from Last 3 Encounters:  03/07/19 238 lb 3.2 oz (108 kg)  02/24/19 234 lb (106.1 kg)  01/24/19 235 lb 12.8 oz (107 kg)     Objective:    Vital Signs:  There were no vitals taken for this visit.   {HeartCare Virtual Exam (Optional):[phone]::"VITAL SIGNS:  reviewed"}  ASSESSMENT & PLAN:    1. ***  COVID-19 Education: The signs and symptoms of COVID-19 were discussed with the patient and how to seek care for testing (follow up with PCP or arrange E-visit).  ***The importance of social distancing was discussed today.  Time:   Today, I have spent *** minutes with the patient with telehealth technology discussing the above problems.     Medication Adjustments/Labs and Tests Ordered: Current medicines are reviewed at length with the patient today.  Concerns regarding medicines are outlined above.   Tests Ordered: No orders of the defined types were placed in this encounter.   Medication Changes: No orders of the defined types were placed in this encounter.   Follow Up:  {F/U Format:[phone]} {follow up:15908}  Signed, Richardson Dopp, PA-C  05/05/2019 1:33 PM    [facility]

## 2019-05-06 ENCOUNTER — Telehealth: Payer: Self-pay | Admitting: Physician Assistant

## 2019-05-24 ENCOUNTER — Encounter: Payer: Self-pay | Admitting: Internal Medicine

## 2019-06-07 ENCOUNTER — Ambulatory Visit (HOSPITAL_COMMUNITY)
Admission: RE | Admit: 2019-06-07 | Discharge: 2019-06-07 | Disposition: A | Discharge status: Home / Self Care | Payer: Self-pay | Source: Ambulatory Visit | Attending: Adult Health | Admitting: Adult Health

## 2019-06-07 ENCOUNTER — Encounter (HOSPITAL_COMMUNITY): Payer: Self-pay

## 2019-06-07 ENCOUNTER — Other Ambulatory Visit: Payer: Self-pay

## 2019-06-07 VITALS — BP 126/60 | HR 72 | Wt 246.8 lb

## 2019-06-07 DIAGNOSIS — Z7901 Long term (current) use of anticoagulants: Secondary | ICD-10-CM | POA: Insufficient documentation

## 2019-06-07 DIAGNOSIS — N183 Chronic kidney disease, stage 3 unspecified: Secondary | ICD-10-CM | POA: Insufficient documentation

## 2019-06-07 DIAGNOSIS — I447 Left bundle-branch block, unspecified: Secondary | ICD-10-CM | POA: Insufficient documentation

## 2019-06-07 DIAGNOSIS — E1122 Type 2 diabetes mellitus with diabetic chronic kidney disease: Secondary | ICD-10-CM | POA: Insufficient documentation

## 2019-06-07 DIAGNOSIS — E7800 Pure hypercholesterolemia, unspecified: Secondary | ICD-10-CM | POA: Insufficient documentation

## 2019-06-07 DIAGNOSIS — Z8249 Family history of ischemic heart disease and other diseases of the circulatory system: Secondary | ICD-10-CM | POA: Insufficient documentation

## 2019-06-07 DIAGNOSIS — I428 Other cardiomyopathies: Secondary | ICD-10-CM | POA: Insufficient documentation

## 2019-06-07 DIAGNOSIS — I2510 Atherosclerotic heart disease of native coronary artery without angina pectoris: Secondary | ICD-10-CM | POA: Insufficient documentation

## 2019-06-07 DIAGNOSIS — I482 Chronic atrial fibrillation, unspecified: Secondary | ICD-10-CM | POA: Insufficient documentation

## 2019-06-07 DIAGNOSIS — Z950 Presence of cardiac pacemaker: Secondary | ICD-10-CM | POA: Insufficient documentation

## 2019-06-07 DIAGNOSIS — Z79899 Other long term (current) drug therapy: Secondary | ICD-10-CM | POA: Insufficient documentation

## 2019-06-07 DIAGNOSIS — I130 Hypertensive heart and chronic kidney disease with heart failure and stage 1 through stage 4 chronic kidney disease, or unspecified chronic kidney disease: Secondary | ICD-10-CM | POA: Insufficient documentation

## 2019-06-07 DIAGNOSIS — I4821 Permanent atrial fibrillation: Secondary | ICD-10-CM

## 2019-06-07 DIAGNOSIS — I5022 Chronic systolic (congestive) heart failure: Secondary | ICD-10-CM | POA: Insufficient documentation

## 2019-06-07 NOTE — Progress Notes (Signed)
PCP: Dr Amil Amen  Cardiologist: Dr Philbert Riser Primary HF Cardiologist: Dr Vaughan Browner  EP : Dr Lovena Le.   SPANISH INTERPRETER PRESENT   HPI: Kyle Burke is a 73 y.o. male [place] speaking male with a history of  chronic systolic heart failure, NICM,  LBBB,  DMII, chroinc A fib, and chronic anticoagulation with xarelto. He does not have medical insurance. He is not a [place] citizen. He requires assistance with medications.  He has been followed by Dr Philbert Riser at [facility]. He was last seen 01/2018. He was stable at that time. Weight was 220 pounds.  Admitted with A/C systolic heart failure. Diuresed with IV lasix and once diuresed transitioned to lasix 40 mg twice a day. EP consulted for LBBB. He is being considered for charitable CRT-D. Discharge weight was 223 pounds.   Admitted 07/13/18 for scheduled CRT-P. He was discharged on 07/14/18 on higher dose of carvedilol and digoxin was started.    Seen in ED 08/13/18 with hematoma around his CRT device. Completed course of Keflex and followed up in ICD device clinic 08/19/2018.   Today he returns for HF follow up. Last visit losartan and coreg increased. Overall feeling fine. Denies SOB/PND/Orthopnea. No chest pain. Able to walk 3 hours without stopping. Appetite ok. No fever or chills. Weight at home 234-236  pounds. Taking all medication.   Cardiac Testing  ECHO 01/24/2019 EF 20-25%  Echo 06/2018- LVEF is further decreased to 15% with severe global hypokinesis, inferior akinesis and septal dyskinesis.RV mildly reduced.  ECHO 2017 [facility]- EF 20-25% RV mildly dilated. Severe hypokinesis.  RHC/ LHC [facility] 2018 at [facility] Nonobs LAD 50%,mild om2.  RA 12 PCWP 31  Review of systems complete and found to be negative unless listed in HPI.   SH:  Social History   Socioeconomic History  . Marital status: Married    Spouse name: Not on file  . Number of children: 5  . Years of education: 9  . Highest education level: Not on file   Occupational History  . Occupation: Ambulance person constrution    Comment: Barista  Social Needs  . Financial resource strain: Not on file  . Food insecurity    Worry: Not on file    Inability: Not on file  . Transportation needs    Medical: Not on file    Non-medical: Not on file  Tobacco Use  . Smoking status: Never Smoker  . Smokeless tobacco: Never Used  Substance and Sexual Activity  . Alcohol use: Yes    Alcohol/week: 0.0 standard drinks    Comment: 07/13/2018 "used to drink; none since 2015"  . Drug use: Never  . Sexual activity: Yes  Lifestyle  . Physical activity    Days per week: Not on file    Minutes per session: Not on file  . Stress: Not on file  Relationships  . Social Herbalist on phone: Not on file    Gets together: Not on file    Attends religious service: Not on file    Active member of club or organization: Not on file    Attends meetings of clubs or organizations: Not on file    Relationship status: Not on file  . Intimate partner violence    Fear of current or ex partner: Not on file    Emotionally abused: Not on file    Physically abused: Not on file    Forced sexual activity: Not on file  Other Topics Concern  . Not  on file  Social History Narrative   Originally from [place]   Came to [place] In 1995   Lives at home with wife, a son and his wife and 3 grandchildren.    FH:  Family History  Problem Relation Age of Onset  . Stroke Mother   . Heart disease Mother   . Heart disease Father   . Diabetes Father   . Kidney disease Father        , history of stones as well  . Diabetes Brother   . Cancer Sister 36       unknown soft tissue cancer of leg    Past Medical History:  Diagnosis Date  . Arthritis    "knees" (07/13/2018)  . Atrial fibrillation ([place]) 2016  . CAD (coronary artery disease)   . High cholesterol   . Hypertension 2006  . Idiopathic cardiomyopathy ([place]) 11/01/2014   Followed by Dr. Acie Fredrickson, Cardiology  EF 25%   Adenosine Cardiolite did not support ischemic etiology  . OA (osteoarthritis) of knee 01/08/2017  . Pleural effusion   . Presence of permanent cardiac pacemaker 07/13/2018  . Refusal of blood transfusions as patient is Jehovah's Witness   . Type II diabetes mellitus ([place]) 2001    Current Outpatient Medications  Medication Sig Dispense Refill  . acetaminophen (TYLENOL) 650 MG CR tablet Take 650 mg by mouth every 8 (eight) hours as needed for pain. Reported on 02/14/2016    . atorvastatin (LIPITOR) 80 MG tablet Take 1 tablet (80 mg total) by mouth daily. 30 tablet 11  . Blood Glucose Monitoring Suppl (AGAMATRIX PRESTO PRO METER) DEVI Twice daily blood glucose checks before meals 1 Device 0  . carvedilol (COREG) 6.25 MG tablet Take 1.5 tablets (9.375 mg total) by mouth 2 (two) times daily with a meal. 90 tablet 6  . digoxin (LANOXIN) 0.125 MG tablet Take 1 tablet (125 mcg total) by mouth daily. 30 tablet 6  . ferrous gluconate (FERGON) 324 MG tablet Take 1 tablet (324 mg total) by mouth daily with breakfast.  3  . furosemide (LASIX) 40 MG tablet Take 1.5 tablets (60 mg total) by mouth every morning AND 1 tablet (40 mg total) every evening. 60 tablet 11  . glucose blood (AGAMATRIX PRESTO TEST) test strip Twice daily glucose checks before meals 100 each 12  . Glycerin-Hypromellose-PEG 400 ([employer] DRY EYE RELIEF) 0.2-0.2-1 % SOLN Place 2 drops into both eyes as needed.    . sildenafil (VIAGRA) 100 MG tablet 1/2 to 1 tab by mouth 1/2 hour before activity, only 1 tab max in 24 hours 10 tablet 11  . XARELTO 20 MG TABS tablet TAKE 1 TABLET BY MOUTH ONCE DAILY WITH SUPPER 30 tablet 11   No current facility-administered medications for this encounter.     Vitals:   06/07/19 1028  BP: 126/60  Pulse: 72  SpO2: 98%  Weight: 111.9 kg (246 lb 12.8 oz)   Wt Readings from Last 3 Encounters:  06/07/19 111.9 kg (246 lb 12.8 oz)  03/07/19 108 kg (238 lb 3.2 oz)  02/24/19 106.1 kg (234 lb)   PHYSICAL EXAM:  Spanish interpreter present.  General:  Well appearing. No resp difficulty HEENT: normal Neck: supple. no JVD. Carotids 2+ bilat; no bruits. No lymphadenopathy or thryomegaly appreciated. Cor: PMI nondisplaced. Regular rate & rhythm. No rubs, gallops or murmurs. Lungs: clear Abdomen: soft, nontender, nondistended. No hepatosplenomegaly. No bruits or masses. Good bowel sounds. Extremities: no cyanosis, clubbing, rash, edema Neuro: alert &  orientedx3, cranial nerves grossly intact. moves all 4 extremities w/o difficulty. Affect pleasant  ASSESSMENT & PLAN: 1. Chronic Systolic Heart Failure, NICM. LHC 2018 50% LAD.  - ECHO 06/2018 EF 15%  LA severely dilated. EF has been down for years.  -  ECHO today 01/24/19 ---->20-25%.  07/13/2018 S/P St Jude CRT-P.  -NYHA I-II. - Volume status stable. He does not need lasix.   - Continue carvedilol 9.375 mg twice a day.   - Continue 0.125 mg digoxin daily.   -Continue losartan 25 mg at bed time. (no entresto, he is not a [place] citizen and has not insurance.  -Intolerant spiro/inspra due to gynecomastia.  2. LBBB s/p St Jude CRT-P.  - Follows  With Dr Lovena Le.   Interrogation on high output mode this was turned off by ST jude rep to preserve battery. 3. Chronic A fib - Rate controlled.  - Continue xarelto 20 mg daily. Denies bleeding.  4. CKD Stage III  Renal function stable 03/21/19  Check BMET  5. DMII - Per PCP.   Check BMET.   Follow up in 4 months with Dr Aundra Dubin.     Darrick Grinder, NP  10:49 AM

## 2019-06-07 NOTE — Patient Instructions (Signed)
Please follow up with the [facility] in 3-4 months.  At the [facility], you and your health needs are our priority. As part of our continuing mission to provide you with exceptional heart care, we have created designated Provider Care Teams. These Care Teams include your primary Cardiologist (physician) and Advanced Practice Providers (APPs- Physician Assistants and Nurse Practitioners) who all work together to provide you with the care you need, when you need it.   You may see any of the following providers on your designated Care Team at your next follow up: Marland Kitchen Dr Glori Bickers . Dr Loralie Champagne . Darrick Grinder, NP . Lyda Jester, PA   Please be sure to bring in all your medications bottles to every appointment.

## 2019-06-10 ENCOUNTER — Other Ambulatory Visit: Payer: Self-pay

## 2019-06-10 ENCOUNTER — Ambulatory Visit (INDEPENDENT_AMBULATORY_CARE_PROVIDER_SITE_OTHER): Payer: Self-pay | Admitting: Gastroenterology

## 2019-06-10 ENCOUNTER — Encounter: Payer: Self-pay | Admitting: Gastroenterology

## 2019-06-10 VITALS — BP 138/70 | HR 76 | Temp 97.1°F | Ht 75.0 in | Wt 246.6 lb

## 2019-06-10 DIAGNOSIS — Z7901 Long term (current) use of anticoagulants: Secondary | ICD-10-CM | POA: Insufficient documentation

## 2019-06-10 DIAGNOSIS — I5022 Chronic systolic (congestive) heart failure: Secondary | ICD-10-CM

## 2019-06-10 DIAGNOSIS — I480 Paroxysmal atrial fibrillation: Secondary | ICD-10-CM

## 2019-06-10 DIAGNOSIS — Z86010 Personal history of colonic polyps: Secondary | ICD-10-CM

## 2019-06-10 NOTE — Patient Instructions (Addendum)
If you are age 72 or older, your body mass index should be between 23-30. Your Body mass index is 30.82 kg/m. If this is out of the aforementioned range listed, please consider follow up with your Primary Care Provider.  If you are age 8 or younger, your body mass index should be between 19-25. Your Body mass index is 30.82 kg/m. If this is out of the aformentioned range listed, please consider follow up with your Primary Care Provider.   We will contact you regarding a date for your colonoscopy with Dr. Hilarie Fredrickson at [facility].  We will contact you regarding holding your Xarelto prior to your procedure.  Thank you for choosing me and [facility].   Alonza Bogus, PA-C

## 2019-06-10 NOTE — Progress Notes (Signed)
06/10/2019 Blenda Mounts [id number] 1946-05-10   HISTORY OF PRESENT ILLNESS: This is a pleasant 73 year old male who is a patient of Dr. Vena Rua.  He had a colonoscopy in September 2017 at which time he had 3 polyps removed, a 5 mm polyp, and two 2 to 3 mm polyps.  They were tubular adenomas.  He also had diverticulosis and internal hemorrhoids.  Repeat colonoscopy was recommended a 3-year interval.  He is on Xarelto for paroxysmal atrial fibrillation.  He has a low ejection fraction of 20 to 25%.  Follows with cardiology at the heart failure clinic and just saw the nurse practitioner 3 days ago.  He had a pacemaker put in in November 2019 and follows with Dr. Lovena Le for that.  Otherwise no new issues.  These issues were present at the time of his last colonoscopy.  He denies any GI issues including rectal bleeding, change in bowel habits, abdominal pain.  He is primarily Spanish-speaking so the entire visit was performed via interpreter.   Past Medical History:  Diagnosis Date  . Arthritis    "knees" (07/13/2018)  . Atrial fibrillation ([place]) 2016  . CAD (coronary artery disease)   . High cholesterol   . Hypertension 2006  . Idiopathic cardiomyopathy ([place]) 11/01/2014   Followed by Dr. Acie Fredrickson, Cardiology  EF 25%  Adenosine Cardiolite did not support ischemic etiology  . OA (osteoarthritis) of knee 01/08/2017  . Pleural effusion   . Presence of permanent cardiac pacemaker 07/13/2018  . Refusal of blood transfusions as patient is Jehovah's Witness   . Type II diabetes mellitus ([place]) 2001   Past Surgical History:  Procedure Laterality Date  . BI-VENTRICULAR PACEMAKER INSERTION (CRT-P)  07/13/2018  . BIV PACEMAKER INSERTION CRT-P N/A 07/13/2018   Procedure: BIV PACEMAKER INSERTION CRT-P;  Surgeon: Evans Lance, MD;  Location: [place] [facility];  Service: Cardiovascular;  Laterality: N/A;  . COLONOSCOPY WITH PROPOFOL N/A 05/14/2016   Procedure: COLONOSCOPY WITH PROPOFOL;   Surgeon: Jerene Bears, MD;  Location: [facility];  Service: Gastroenterology;  Laterality: N/A;  . ESOPHAGOGASTRODUODENOSCOPY (EGD) WITH PROPOFOL N/A 05/14/2016   Procedure: ESOPHAGOGASTRODUODENOSCOPY (EGD) WITH PROPOFOL;  Surgeon: Jerene Bears, MD;  Location: [facility];  Service: Gastroenterology;  Laterality: N/A;  . HERNIA REPAIR    . INCISE AND DRAIN ABCESS Left 1986   on leg  . INGUINAL HERNIA REPAIR  01/16/2012   Procedure: LAPAROSCOPIC INGUINAL HERNIA;  Surgeon: Gayland Curry, MD,FACS;  Location: [facility];  Service: General;  Laterality: Left;  Laparoscopic incarcerated Left inguinal hernia repair with mesh, umbilical hernia repair  . UMBILICAL HERNIA REPAIR  01/16/2012   Procedure: HERNIA REPAIR UMBILICAL ADULT;  Surgeon: Gayland Curry, MD,FACS;  Location: [facility];  Service: General;  Laterality: N/A;    reports that he has never smoked. He has never used smokeless tobacco. He reports current alcohol use. He reports that he does not use drugs. family history includes Cancer (age of onset: 59) in his sister; Diabetes in his brother and father; Heart disease in his father and mother; Kidney disease in his father; Stroke in his mother. Allergies  Allergen Reactions  . Spironolactone Other (See Comments)    Gynecomastia      Outpatient Encounter Medications as of 06/10/2019  Medication Sig  . acetaminophen (TYLENOL) 650 MG CR tablet Take 650 mg by mouth every 8 (eight) hours as needed for pain. Reported on 02/14/2016  . atorvastatin (LIPITOR) 80 MG tablet Take 1  tablet (80 mg total) by mouth daily.  . Blood Glucose Monitoring Suppl (AGAMATRIX PRESTO PRO METER) DEVI Twice daily blood glucose checks before meals  . carvedilol (COREG) 6.25 MG tablet Take 1.5 tablets (9.375 mg total) by mouth 2 (two) times daily with a meal.  . digoxin (LANOXIN) 0.125 MG tablet Take 1 tablet (125 mcg total) by mouth daily.  . ferrous gluconate (FERGON) 324 MG tablet Take 1 tablet (324 mg total) by mouth  daily with breakfast.  . furosemide (LASIX) 40 MG tablet Take 1.5 tablets (60 mg total) by mouth every morning AND 1 tablet (40 mg total) every evening.  Marland Kitchen glucose blood (AGAMATRIX PRESTO TEST) test strip Twice daily glucose checks before meals  . Glycerin-Hypromellose-PEG 400 ([employer] DRY EYE RELIEF) 0.2-0.2-1 % SOLN Place 2 drops into both eyes as needed.  . sildenafil (VIAGRA) 100 MG tablet 1/2 to 1 tab by mouth 1/2 hour before activity, only 1 tab max in 24 hours  . XARELTO 20 MG TABS tablet TAKE 1 TABLET BY MOUTH ONCE DAILY WITH SUPPER   No facility-administered encounter medications on file as of 06/10/2019.      REVIEW OF SYSTEMS  : All other systems reviewed and negative except where noted in the History of Present Illness.   PHYSICAL EXAM: BP 138/70   Pulse 76   Temp (!) 97.1 F (36.2 C)   Ht 6\' 3"  (1.905 m)   Wt 246 lb 9.6 oz (111.9 kg)   BMI 30.82 kg/m  General: Well developed Hispanic male in no acute distress Head: Normocephalic and atraumatic Eyes:  Sclerae anicteric, conjunctiva pink. Ears: Normal auditory acuity Lungs: Clear throughout to auscultation; no increased WOB. Heart: Regular rate and rhythm; no M/R/G. Abdomen: Soft, non-distended.  BS present.  Non-tender. Rectal:  Will be done at the time of colonoscopy. Musculoskeletal: Symmetrical with no gross deformities  Skin: No lesions on visible extremities Extremities: No edema  Neurological: Alert oriented x 4, grossly non-focal Psychological:  Alert and cooperative. Normal mood and affect  ASSESSMENT AND PLAN: *Personal history of colon polyps:  Tubular adenomas removed on colonoscopy in 04/2016 so repeat recommended in 3 years.  Will schedule with Dr. Hilarie Fredrickson. *Chronic anticoagulation for atrial fibrillation:  Will hold Xarelto for 2 days prior to endoscopic procedures - will instruct when and how to resume after procedure. Benefits and risks of procedure explained including risks of bleeding, perforation,  infection, missed lesions, reactions to medications and possible need for hospitalization and surgery for complications. Additional rare but real risk of stroke or other vascular clotting events off of Xarelto also explained and need to seek urgent help if any signs of these problems occur. Will communicate by phone or EMR with patient's prescribing provider, to confirm that holding Xarelto is reasonable in this case.  *Chronic systolic HF, NICM with EF of 20-25%:  Will need colonoscopy to be done in the hospital setting.     CC:  Mack Hook, MD

## 2019-06-16 NOTE — Progress Notes (Signed)
Addendum: Reviewed and agree with assessment and management plan. Adrean Heitz M, MD  

## 2019-06-24 ENCOUNTER — Other Ambulatory Visit: Payer: Self-pay

## 2019-06-24 DIAGNOSIS — E1122 Type 2 diabetes mellitus with diabetic chronic kidney disease: Secondary | ICD-10-CM

## 2019-06-24 DIAGNOSIS — Z79899 Other long term (current) drug therapy: Secondary | ICD-10-CM

## 2019-06-25 LAB — CBC WITH DIFFERENTIAL/PLATELET
Basophils Absolute: 0 10*3/uL (ref 0.0–0.2)
Basos: 1 %
EOS (ABSOLUTE): 0.1 10*3/uL (ref 0.0–0.4)
Eos: 3 %
Hematocrit: 36.6 % — ABNORMAL LOW (ref 37.5–51.0)
Hemoglobin: 12.3 g/dL — ABNORMAL LOW (ref 13.0–17.7)
Immature Grans (Abs): 0 10*3/uL (ref 0.0–0.1)
Immature Granulocytes: 0 %
Lymphocytes Absolute: 1.5 10*3/uL (ref 0.7–3.1)
Lymphs: 29 %
MCH: 31 pg (ref 26.6–33.0)
MCHC: 33.6 g/dL (ref 31.5–35.7)
MCV: 92 fL (ref 79–97)
Monocytes Absolute: 0.6 10*3/uL (ref 0.1–0.9)
Monocytes: 12 %
Neutrophils Absolute: 2.9 10*3/uL (ref 1.4–7.0)
Neutrophils: 55 %
Platelets: 175 10*3/uL (ref 150–450)
RBC: 3.97 x10E6/uL — ABNORMAL LOW (ref 4.14–5.80)
RDW: 12.6 % (ref 11.6–15.4)
WBC: 5.3 10*3/uL (ref 3.4–10.8)

## 2019-06-25 LAB — COMPREHENSIVE METABOLIC PANEL
ALT: 20 IU/L (ref 0–44)
AST: 19 IU/L (ref 0–40)
Albumin/Globulin Ratio: 1.7 (ref 1.2–2.2)
Albumin: 4.6 g/dL (ref 3.7–4.7)
Alkaline Phosphatase: 103 IU/L (ref 39–117)
BUN/Creatinine Ratio: 20 (ref 10–24)
BUN: 30 mg/dL — ABNORMAL HIGH (ref 8–27)
Bilirubin Total: 1.1 mg/dL (ref 0.0–1.2)
CO2: 26 mmol/L (ref 20–29)
Calcium: 9.2 mg/dL (ref 8.6–10.2)
Chloride: 100 mmol/L (ref 96–106)
Creatinine, Ser: 1.51 mg/dL — ABNORMAL HIGH (ref 0.76–1.27)
GFR calc Af Amer: 53 mL/min/{1.73_m2} — ABNORMAL LOW (ref >59)
GFR calc non Af Amer: 45 mL/min/{1.73_m2} — ABNORMAL LOW (ref >59)
Globulin, Total: 2.7 g/dL (ref 1.5–4.5)
Glucose: 95 mg/dL (ref 65–99)
Potassium: 4.8 mmol/L (ref 3.5–5.2)
Sodium: 139 mmol/L (ref 134–144)
Total Protein: 7.3 g/dL (ref 6.0–8.5)

## 2019-06-25 LAB — HGB A1C W/O EAG: Hgb A1c MFr Bld: 6.1 % — ABNORMAL HIGH (ref 4.8–5.6)

## 2019-06-27 ENCOUNTER — Other Ambulatory Visit: Payer: Self-pay

## 2019-06-27 MED ORDER — FUROSEMIDE 40 MG PO TABS: ORAL_TABLET | ORAL | 11 refills | Status: DC

## 2019-06-30 ENCOUNTER — Ambulatory Visit: Payer: Self-pay | Admitting: Internal Medicine

## 2019-06-30 ENCOUNTER — Encounter: Payer: Self-pay | Admitting: Internal Medicine

## 2019-06-30 ENCOUNTER — Other Ambulatory Visit: Payer: Self-pay

## 2019-06-30 VITALS — BP 136/82 | HR 82 | Resp 12 | Ht 75.0 in | Wt 245.0 lb

## 2019-06-30 DIAGNOSIS — N183 Chronic kidney disease, stage 3 unspecified: Secondary | ICD-10-CM

## 2019-06-30 DIAGNOSIS — I5022 Chronic systolic (congestive) heart failure: Secondary | ICD-10-CM

## 2019-06-30 DIAGNOSIS — I10 Essential (primary) hypertension: Secondary | ICD-10-CM

## 2019-06-30 DIAGNOSIS — D649 Anemia, unspecified: Secondary | ICD-10-CM

## 2019-06-30 DIAGNOSIS — E1122 Type 2 diabetes mellitus with diabetic chronic kidney disease: Secondary | ICD-10-CM

## 2019-06-30 DIAGNOSIS — I482 Chronic atrial fibrillation, unspecified: Secondary | ICD-10-CM

## 2019-06-30 DIAGNOSIS — Z23 Encounter for immunization: Secondary | ICD-10-CM

## 2019-06-30 NOTE — Progress Notes (Signed)
Subjective:    Patient ID: Kyle Burke, male   DOB: 1945-12-10, 73 y.o.   MRN: [id number]   HPI   1.  Cardiomyopathy/Chronic afib:  Energy is fine.  No chest pain or dyspnea.  No leg swelling.  No palpitations.  Afib with biventricular pacemaker and taking Xarelto.  Carvedilol 6.25 mg twice daily.  Digoxin 0.125 mg daily.  Furosemide 40 mg daily.   2.  DM:  A1C recently at 6.1% which is up a bit, but still quite good.  States he is taking cactus juice only now for DM.  Diet controlled.   Checks his feet nightly and no problems.  Has an appt with eye doctor in December.    3.  Anemia:  Stable.  Has had extensive GI evaluation 2017.  Continues iron supplementation. Plans for holding Xarelto for repeat colonoscopy due to findings of adenomatous polyps in 2017.  GI is contacting cardiology regarding this with his Xarelto per patient..  4.  Elevated creatinine and BUN:  Labs were fasting.  Discussed perhaps was dehydrated with this testing.    Current Meds  Medication Sig  . acetaminophen (TYLENOL) 650 MG CR tablet Take 650 mg by mouth every 8 (eight) hours as needed for pain. Reported on 02/14/2016  . atorvastatin (LIPITOR) 80 MG tablet Take 1 tablet (80 mg total) by mouth daily.  . Blood Glucose Monitoring Suppl (AGAMATRIX PRESTO PRO METER) DEVI Twice daily blood glucose checks before meals  . carvedilol (COREG) 6.25 MG tablet Take 1.5 tablets (9.375 mg total) by mouth 2 (two) times daily with a meal.  . digoxin (LANOXIN) 0.125 MG tablet Take 1 tablet (125 mcg total) by mouth daily.  . ferrous gluconate (FERGON) 324 MG tablet Take 1 tablet (324 mg total) by mouth daily with breakfast.  . furosemide (LASIX) 40 MG tablet Take 1.5 tablets (60 mg total) by mouth every morning AND 1 tablet (40 mg total) every evening.  Marland Kitchen glucose blood (AGAMATRIX PRESTO TEST) test strip Twice daily glucose checks before meals  . Glycerin-Hypromellose-PEG 400 ([employer] DRY EYE RELIEF) 0.2-0.2-1 % SOLN  Place 2 drops into both eyes as needed.  . sildenafil (VIAGRA) 100 MG tablet 1/2 to 1 tab by mouth 1/2 hour before activity, only 1 tab max in 24 hours  . XARELTO 20 MG TABS tablet TAKE 1 TABLET BY MOUTH ONCE DAILY WITH SUPPER   Allergies  Allergen Reactions  . Spironolactone Other (See Comments)    Gynecomastia     Review of Systems    Objective:   BP 136/82 (BP Location: Left Arm, Patient Position: Sitting, Cuff Size: Normal)   Pulse 82   Resp 12   Ht 6\' 3"  (1.905 m)   Wt 245 lb (111.1 kg)   BMI 30.62 kg/m   Physical Exam  NAD Lungs:  CTA CV:  RRR without murmur or rub.  No carotid bruit.  Carotid, radial and DP pulses normal and equal. Trace edema with minimal sock rib indentations--but has not had to cut socks currently as in past. Abd:  S, NT, No HSM or mass, + BS Knees:  Hypertrophic changes, does limp a bit with both legs.   Assessment & Plan   1.  Cardiomyopathy, chronic afib, biventricular pacemaker:  Continues to do well.  2.  Increased Creatinine and BUN:  Recheck in 1 month.  3.  DM:  Controlled currently with diet only.  4.  Hypertension:  Controlled.  5.  Normocytic anemia:  No  significant finding in past as to cause from GI tract or otherwise.  Hemoglobin remains stable  6.  HM:  Pneumococcal 13 today.  Has eye appt next month.  Upcoming colonoscopy with history of adenomatous polyps in 2017.

## 2019-07-12 ENCOUNTER — Telehealth: Payer: Self-pay

## 2019-07-12 ENCOUNTER — Other Ambulatory Visit: Payer: Self-pay

## 2019-07-12 DIAGNOSIS — Z7901 Long term (current) use of anticoagulants: Secondary | ICD-10-CM

## 2019-07-12 DIAGNOSIS — Z86010 Personal history of colonic polyps: Secondary | ICD-10-CM

## 2019-07-12 NOTE — Telephone Encounter (Signed)
[facility] Pre-operative Risk Assessment     Request for surgical clearance:     Endoscopy Procedure  What type of surgery is being performed?     08/23/19  When is this surgery scheduled?     Colonoscopy  What type of clearance is required ?   Pharmacy  Are there any medications that need to be held prior to surgery and how long? [place] TWO DAYS PRIOR  Practice name and name of physician performing surgery?      [facility]/Dr. Hilarie Fredrickson  What is your office phone and fax number?      Phone- [phone]  Fax- [phone]  Attn: Peter Congo RMA  Anesthesia type (None, local, MAC, general) ?       MAC

## 2019-07-12 NOTE — Telephone Encounter (Signed)
He is symptomatically improved but EF still quite low so at least moderate risk for colonoscopy. Would defer if possible.   I will see him in 1/21 and can recheck echo and see if EF improved.

## 2019-07-13 ENCOUNTER — Ambulatory Visit (INDEPENDENT_AMBULATORY_CARE_PROVIDER_SITE_OTHER): Payer: Self-pay | Admitting: *Deleted

## 2019-07-13 DIAGNOSIS — Z950 Presence of cardiac pacemaker: Secondary | ICD-10-CM

## 2019-07-15 LAB — CUP PACEART REMOTE DEVICE CHECK
Battery Remaining Longevity: 79 mo
Battery Remaining Percentage: 95.5 %
Battery Voltage: 2.99 V
Brady Statistic AP VP Percent: 96 %
Brady Statistic AP VS Percent: 0 %
Brady Statistic AS VP Percent: 1 %
Brady Statistic AS VS Percent: 1 %
Brady Statistic RA Percent Paced: 93 %
Date Time Interrogation Session: 20201124020010
Implantable Lead Implant Date: 20191126
Implantable Lead Implant Date: 20191126
Implantable Lead Implant Date: 20191126
Implantable Lead Location: 753858
Implantable Lead Location: 753860
Implantable Lead Location: 753860
Implantable Lead Model: 3830
Implantable Pulse Generator Implant Date: 20191126
Lead Channel Impedance Value: 560 Ohm
Lead Channel Impedance Value: 600 Ohm
Lead Channel Impedance Value: 600 Ohm
Lead Channel Pacing Threshold Amplitude: 0.75 V
Lead Channel Pacing Threshold Amplitude: 0.75 V
Lead Channel Pacing Threshold Amplitude: 1 V
Lead Channel Pacing Threshold Pulse Width: 0.5 ms
Lead Channel Pacing Threshold Pulse Width: 0.5 ms
Lead Channel Pacing Threshold Pulse Width: 1 ms
Lead Channel Sensing Intrinsic Amplitude: 1 mV
Lead Channel Sensing Intrinsic Amplitude: 7.6 mV
Lead Channel Setting Pacing Amplitude: 2 V
Lead Channel Setting Pacing Amplitude: 2 V
Lead Channel Setting Pacing Amplitude: 2.5 V
Lead Channel Setting Pacing Pulse Width: 0.5 ms
Lead Channel Setting Pacing Pulse Width: 0.5 ms
Lead Channel Setting Sensing Sensitivity: 0.7 mV
Pulse Gen Serial Number: 9060242

## 2019-07-28 ENCOUNTER — Other Ambulatory Visit (INDEPENDENT_AMBULATORY_CARE_PROVIDER_SITE_OTHER): Payer: Self-pay

## 2019-07-28 ENCOUNTER — Other Ambulatory Visit: Payer: Self-pay

## 2019-07-28 DIAGNOSIS — Z79899 Other long term (current) drug therapy: Secondary | ICD-10-CM

## 2019-07-29 LAB — BASIC METABOLIC PANEL
BUN/Creatinine Ratio: 18 (ref 10–24)
BUN: 21 mg/dL (ref 8–27)
CO2: 25 mmol/L (ref 20–29)
Calcium: 9.4 mg/dL (ref 8.6–10.2)
Chloride: 100 mmol/L (ref 96–106)
Creatinine, Ser: 1.17 mg/dL (ref 0.76–1.27)
GFR calc Af Amer: 72 mL/min/{1.73_m2} (ref >59)
GFR calc non Af Amer: 62 mL/min/{1.73_m2} (ref >59)
Glucose: 119 mg/dL — ABNORMAL HIGH (ref 65–99)
Potassium: 4.6 mmol/L (ref 3.5–5.2)
Sodium: 139 mmol/L (ref 134–144)

## 2019-08-18 ENCOUNTER — Other Ambulatory Visit (HOSPITAL_COMMUNITY): Payer: Self-pay

## 2019-08-23 ENCOUNTER — Ambulatory Visit (HOSPITAL_COMMUNITY): Admit: 2019-08-23 | Payer: Self-pay | Admitting: Internal Medicine

## 2019-08-23 ENCOUNTER — Encounter (HOSPITAL_COMMUNITY): Payer: Self-pay

## 2019-08-23 SURGERY — COLONOSCOPY WITH PROPOFOL
Anesthesia: Monitor Anesthesia Care

## 2019-09-02 ENCOUNTER — Other Ambulatory Visit: Payer: Self-pay

## 2019-09-02 ENCOUNTER — Other Ambulatory Visit: Payer: Self-pay | Admitting: Internal Medicine

## 2019-09-06 ENCOUNTER — Ambulatory Visit (INDEPENDENT_AMBULATORY_CARE_PROVIDER_SITE_OTHER): Payer: Self-pay | Admitting: Internal Medicine

## 2019-09-06 ENCOUNTER — Encounter: Payer: Self-pay | Admitting: Internal Medicine

## 2019-09-06 ENCOUNTER — Other Ambulatory Visit: Payer: Self-pay

## 2019-09-06 VITALS — BP 122/68 | HR 73 | Ht 75.0 in | Wt 246.0 lb

## 2019-09-06 DIAGNOSIS — I4821 Permanent atrial fibrillation: Secondary | ICD-10-CM

## 2019-09-06 DIAGNOSIS — Z950 Presence of cardiac pacemaker: Secondary | ICD-10-CM

## 2019-09-06 DIAGNOSIS — I5022 Chronic systolic (congestive) heart failure: Secondary | ICD-10-CM

## 2019-09-06 DIAGNOSIS — I447 Left bundle-branch block, unspecified: Secondary | ICD-10-CM

## 2019-09-06 NOTE — Patient Instructions (Signed)
Medication Instructions:  Your physician recommends that you continue on your current medications as directed. Please refer to the Current Medication list given to you today.  Labwork: None ordered.  Testing/Procedures: None ordered.  Follow-Up: Your physician wants you to follow-up in: one year with Dr. Lovena Le.   You will receive a reminder letter in the mail two months in advance. If you don't receive a letter, please call our office to schedule the follow-up appointment.  Remote monitoring is used to monitor your Pacemaker from home. This monitoring reduces the number of office visits required to check your device to one time per year. It allows [place] to keep an eye on the functioning of your device to ensure it is working properly. You are scheduled for a device check from home on 10/12/2019. You may send your transmission at any time that day. If you have a wireless device, the transmission will be sent automatically. After your physician reviews your transmission, you will receive a postcard with your next transmission date.  Any Other Special Instructions Will Be Listed Below (If Applicable).  If you need a refill on your cardiac medications before your next appointment, please call your pharmacy.

## 2019-09-06 NOTE — Progress Notes (Signed)
HPI Kyle Burke returns today for followup. He has a h/o chronic systolic heart failure, LBBB, chronic atrial fib, and is s/p Biv PPM insertion. He received a His bundle lead in the atrial port, an LV and RV leads and his EF has improved from 15% to 25%. He has class 2 symptoms. He has not had syncope and denies peripheral edema, chest pain or sob.  Allergies  Allergen Reactions  . Spironolactone Other (See Comments)    Gynecomastia     Current Outpatient Medications  Medication Sig Dispense Refill  . acetaminophen (TYLENOL) 650 MG CR tablet Take 650 mg by mouth every 8 (eight) hours as needed for pain. Reported on 02/14/2016    . atorvastatin (LIPITOR) 80 MG tablet TAKE 1 TABLET (80MG  TOTAL) BY MOUTH ONCE A DAY 30 tablet 11  . Blood Glucose Monitoring Suppl (AGAMATRIX PRESTO PRO METER) DEVI Twice daily blood glucose checks before meals 1 Device 0  . carvedilol (COREG) 6.25 MG tablet Take 1.5 tablets (9.375 mg total) by mouth 2 (two) times daily with a meal. 90 tablet 6  . digoxin (LANOXIN) 0.125 MG tablet Take 1 tablet (125 mcg total) by mouth daily. 30 tablet 6  . ferrous gluconate (FERGON) 324 MG tablet Take 1 tablet (324 mg total) by mouth daily with breakfast.  3  . furosemide (LASIX) 40 MG tablet Take 1.5 tablets (60 mg total) by mouth every morning AND 1 tablet (40 mg total) every evening. 90 tablet 11  . glucose blood (AGAMATRIX PRESTO TEST) test strip Twice daily glucose checks before meals 100 each 12  . Glycerin-Hypromellose-PEG 400 ([facility] DRY EYE RELIEF) 0.2-0.2-1 % SOLN Place 2 drops into both eyes as needed.    . sildenafil (VIAGRA) 100 MG tablet 1/2 to 1 tab by mouth 1/2 hour before activity, only 1 tab max in 24 hours 10 tablet 11  . XARELTO 20 MG TABS tablet TAKE 1 TABLET BY MOUTH ONCE DAILY WITH SUPPER 30 tablet 11   No current facility-administered medications for this visit.     Past Medical History:  Diagnosis Date  . Arthritis    "knees" (07/13/2018)  .  Atrial fibrillation ([place]) 2016  . CAD (coronary artery disease)   . High cholesterol   . Hypertension 2006  . Idiopathic cardiomyopathy ([place]) 11/01/2014   Followed by Dr. Acie Fredrickson, Cardiology  EF 25%  Adenosine Cardiolite did not support ischemic etiology  . OA (osteoarthritis) of knee 01/08/2017  . Pleural effusion   . Presence of permanent cardiac pacemaker 07/13/2018  . Refusal of blood transfusions as patient is Jehovah's Witness   . Type II diabetes mellitus ([place]) 2001    ROS:   All systems reviewed and negative except as noted in the HPI.   Past Surgical History:  Procedure Laterality Date  . BI-VENTRICULAR PACEMAKER INSERTION (CRT-P)  07/13/2018  . BIV PACEMAKER INSERTION CRT-P N/A 07/13/2018   Procedure: BIV PACEMAKER INSERTION CRT-P;  Surgeon: Evans Lance, MD;  Location: [facility];  Service: Cardiovascular;  Laterality: N/A;  . COLONOSCOPY WITH PROPOFOL N/A 05/14/2016   Procedure: COLONOSCOPY WITH PROPOFOL;  Surgeon: Jerene Bears, MD;  Location: [facility];  Service: Gastroenterology;  Laterality: N/A;  . ESOPHAGOGASTRODUODENOSCOPY (EGD) WITH PROPOFOL N/A 05/14/2016   Procedure: ESOPHAGOGASTRODUODENOSCOPY (EGD) WITH PROPOFOL;  Surgeon: Jerene Bears, MD;  Location: [facility];  Service: Gastroenterology;  Laterality: N/A;  . HERNIA REPAIR    . INCISE AND DRAIN ABCESS Left 1986  on leg  . INGUINAL HERNIA REPAIR  01/16/2012   Procedure: LAPAROSCOPIC INGUINAL HERNIA;  Surgeon: Gayland Curry, MD,FACS;  Location: [facility];  Service: General;  Laterality: Left;  Laparoscopic incarcerated Left inguinal hernia repair with mesh, umbilical hernia repair  . UMBILICAL HERNIA REPAIR  01/16/2012   Procedure: HERNIA REPAIR UMBILICAL ADULT;  Surgeon: Gayland Curry, MD,FACS;  Location: [facility];  Service: General;  Laterality: N/A;     Family History  Problem Relation Age of Onset  . Stroke Mother   . Heart disease Mother   . Heart disease Father   . Diabetes Father   . Kidney  disease Father        , history of stones as well  . Diabetes Brother   . Cancer Sister 9       unknown soft tissue cancer of leg     Social History   Socioeconomic History  . Marital status: Married    Spouse name: Not on file  . Number of children: 5  . Years of education: 9  . Highest education level: Not on file  Occupational History  . Occupation: Ambulance person constrution    Comment: Barista  Tobacco Use  . Smoking status: Never Smoker  . Smokeless tobacco: Never Used  Substance and Sexual Activity  . Alcohol use: Yes    Alcohol/week: 0.0 standard drinks    Comment: 07/13/2018 "used to drink; none since 2015"  . Drug use: Never  . Sexual activity: Yes  Other Topics Concern  . Not on file  Social History Narrative   Originally from [place]   Came to [employer] In 1995   Lives at home with wife, a son and his wife and 3 grandchildren.   Social Determinants of Health   Financial Resource Strain:   . Difficulty of Paying Living Expenses: Not on file  Food Insecurity:   . Worried About Charity fundraiser in the Last Year: Not on file  . Ran Out of Food in the Last Year: Not on file  Transportation Needs:   . Lack of Transportation (Medical): Not on file  . Lack of Transportation (Non-Medical): Not on file  Physical Activity:   . Days of Exercise per Week: Not on file  . Minutes of Exercise per Session: Not on file  Stress:   . Feeling of Stress : Not on file  Social Connections:   . Frequency of Communication with Friends and Family: Not on file  . Frequency of Social Gatherings with Friends and Family: Not on file  . Attends Religious Services: Not on file  . Active Member of Clubs or Organizations: Not on file  . Attends Archivist Meetings: Not on file  . Marital Status: Not on file  Intimate Partner Violence:   . Fear of Current or Ex-Partner: Not on file  . Emotionally Abused: Not on file  . Physically Abused: Not on file  . Sexually Abused:  Not on file     BP 122/68   Pulse 73   Ht 6\' 3"  (1.905 m)   Wt 246 lb (111.6 kg)   SpO2 98%   BMI 30.75 kg/m   Physical Exam:  Well appearing NAD HEENT: Unremarkable Neck:  No JVD, no thyromegally Lymphatics:  No adenopathy Back:  No CVA tenderness Lungs:  Clear with no wheezes HEART:  Regular rate rhythm, no murmurs, no rubs, no clicks Abd:  soft, positive bowel sounds, no organomegally, no rebound, no guarding Ext:  2 plus pulses, no edema, no cyanosis, no clubbing Skin:  No rashes no nodules Neuro:  CN II through XII intact, motor grossly intact  EKG - atrial fib with ventricular pacing  DEVICE  Normal device function.  See PaceArt for details.   Assess/Plan: 1. Chronic atrial fib - he is well controlled after PPM insertion 2. PPM - his biv PPM is working normally.  3. Chronic systolic heart failure - his symptoms remain class 2. He will follow.  Mikle Bosworth.D.

## 2019-09-12 ENCOUNTER — Ambulatory Visit (HOSPITAL_COMMUNITY)
Admission: RE | Admit: 2019-09-12 | Discharge: 2019-09-12 | Disposition: A | Discharge status: Home / Self Care | Payer: Self-pay | Source: Ambulatory Visit | Attending: Internal Medicine | Admitting: Internal Medicine

## 2019-09-12 ENCOUNTER — Other Ambulatory Visit: Payer: Self-pay

## 2019-09-12 ENCOUNTER — Encounter (HOSPITAL_COMMUNITY): Payer: Self-pay | Admitting: Internal Medicine

## 2019-09-12 VITALS — BP 126/69 | HR 75 | Wt 248.4 lb

## 2019-09-12 DIAGNOSIS — I428 Other cardiomyopathies: Secondary | ICD-10-CM | POA: Insufficient documentation

## 2019-09-12 DIAGNOSIS — Z01810 Encounter for preprocedural cardiovascular examination: Secondary | ICD-10-CM

## 2019-09-12 DIAGNOSIS — Z950 Presence of cardiac pacemaker: Secondary | ICD-10-CM | POA: Insufficient documentation

## 2019-09-12 DIAGNOSIS — I447 Left bundle-branch block, unspecified: Secondary | ICD-10-CM | POA: Insufficient documentation

## 2019-09-12 DIAGNOSIS — Z833 Family history of diabetes mellitus: Secondary | ICD-10-CM | POA: Insufficient documentation

## 2019-09-12 DIAGNOSIS — I482 Chronic atrial fibrillation, unspecified: Secondary | ICD-10-CM | POA: Insufficient documentation

## 2019-09-12 DIAGNOSIS — E7800 Pure hypercholesterolemia, unspecified: Secondary | ICD-10-CM | POA: Insufficient documentation

## 2019-09-12 DIAGNOSIS — Z7901 Long term (current) use of anticoagulants: Secondary | ICD-10-CM | POA: Insufficient documentation

## 2019-09-12 DIAGNOSIS — E1122 Type 2 diabetes mellitus with diabetic chronic kidney disease: Secondary | ICD-10-CM | POA: Insufficient documentation

## 2019-09-12 DIAGNOSIS — M1710 Unilateral primary osteoarthritis, unspecified knee: Secondary | ICD-10-CM | POA: Insufficient documentation

## 2019-09-12 DIAGNOSIS — I130 Hypertensive heart and chronic kidney disease with heart failure and stage 1 through stage 4 chronic kidney disease, or unspecified chronic kidney disease: Secondary | ICD-10-CM | POA: Insufficient documentation

## 2019-09-12 DIAGNOSIS — N183 Chronic kidney disease, stage 3 unspecified: Secondary | ICD-10-CM

## 2019-09-12 DIAGNOSIS — I4821 Permanent atrial fibrillation: Secondary | ICD-10-CM

## 2019-09-12 DIAGNOSIS — Z79899 Other long term (current) drug therapy: Secondary | ICD-10-CM | POA: Insufficient documentation

## 2019-09-12 DIAGNOSIS — Z809 Family history of malignant neoplasm, unspecified: Secondary | ICD-10-CM | POA: Insufficient documentation

## 2019-09-12 DIAGNOSIS — I2510 Atherosclerotic heart disease of native coronary artery without angina pectoris: Secondary | ICD-10-CM | POA: Insufficient documentation

## 2019-09-12 DIAGNOSIS — N1831 Chronic kidney disease, stage 3a: Secondary | ICD-10-CM | POA: Insufficient documentation

## 2019-09-12 DIAGNOSIS — Z841 Family history of disorders of kidney and ureter: Secondary | ICD-10-CM | POA: Insufficient documentation

## 2019-09-12 DIAGNOSIS — Z823 Family history of stroke: Secondary | ICD-10-CM | POA: Insufficient documentation

## 2019-09-12 DIAGNOSIS — Z8249 Family history of ischemic heart disease and other diseases of the circulatory system: Secondary | ICD-10-CM | POA: Insufficient documentation

## 2019-09-12 DIAGNOSIS — I5022 Chronic systolic (congestive) heart failure: Secondary | ICD-10-CM | POA: Insufficient documentation

## 2019-09-12 LAB — DIGOXIN LEVEL: Digoxin Level: 0.2 ng/mL — ABNORMAL LOW (ref 0.8–2.0)

## 2019-09-12 LAB — BASIC METABOLIC PANEL
Anion gap: 11 (ref 5–15)
BUN: 23 mg/dL (ref 8–23)
CO2: 26 mmol/L (ref 22–32)
Calcium: 8.9 mg/dL (ref 8.9–10.3)
Chloride: 101 mmol/L (ref 98–111)
Creatinine, Ser: 1.27 mg/dL — ABNORMAL HIGH (ref 0.61–1.24)
GFR calc Af Amer: >60 mL/min (ref >60)
GFR calc non Af Amer: 56 mL/min — ABNORMAL LOW (ref >60)
Glucose, Bld: 181 mg/dL — ABNORMAL HIGH (ref 70–99)
Potassium: 4.3 mmol/L (ref 3.5–5.1)
Sodium: 138 mmol/L (ref 135–145)

## 2019-09-12 LAB — CBC
HCT: 38.4 % — ABNORMAL LOW (ref 39.0–52.0)
Hemoglobin: 12.5 g/dL — ABNORMAL LOW (ref 13.0–17.0)
MCH: 31.3 pg (ref 26.0–34.0)
MCHC: 32.6 g/dL (ref 30.0–36.0)
MCV: 96.2 fL (ref 80.0–100.0)
Platelets: 209 10*3/uL (ref 150–400)
RBC: 3.99 MIL/uL — ABNORMAL LOW (ref 4.22–5.81)
RDW: 12.8 % (ref 11.5–15.5)
WBC: 5.6 10*3/uL (ref 4.0–10.5)
nRBC: 0 % (ref 0.0–0.2)

## 2019-09-12 LAB — BRAIN NATRIURETIC PEPTIDE: B Natriuretic Peptide: 114.5 pg/mL — ABNORMAL HIGH (ref 0.0–100.0)

## 2019-09-12 MED ORDER — LOSARTAN POTASSIUM 50 MG PO TABS: 50.0000 mg | ORAL_TABLET | Freq: Every day | ORAL | 6 refills | Status: DC

## 2019-09-12 NOTE — Patient Instructions (Addendum)
Aumentar losartn a 50 mg (1 tableta) al da  Laboratorios realizados hoy, nos comunicaremos con usted para lecturas anormales.  Lo llamaremos en 6 meses para programar su prxima cita y ecocardiograma

## 2019-09-12 NOTE — Addendum Note (Signed)
Encounter addended by: Jolaine Artist, MD on: 09/12/2019 3:10 PM  Actions taken: Clinical Note Signed, Charge Capture section accepted

## 2019-09-12 NOTE — Progress Notes (Addendum)
PCP: Dr Amil Amen  Cardiologist: Dr Philbert Riser Primary HF Cardiologist: Dr Vaughan Browner  EP : Dr Lovena Le.   SPANISH INTERPRETER PRESENT   HPI: Kyle Burke is a 74 y.o. male [place] speaking male with a history of  chronic systolic heart failure, NICM,  LBBB,  DMII, chroinc A fib, and chronic anticoagulation with xarelto. He does not have medical insurance. He is not a [place] citizen. He requires assistance with medications.  He has been followed by Dr Philbert Riser at [facility]. He was last seen 01/2018. He was stable at that time. Weight was 220 pounds.  Admitted with A/C systolic heart failure. Diuresed with IV lasix and once diuresed transitioned to lasix 40 mg twice a day. EP consulted for LBBB. He is being considered for charitable CRT-D. Discharge weight was 223 pounds.   Admitted 07/13/18 for scheduled CRT-P. He was discharged on 07/14/18 on higher dose of carvedilol and digoxin was started.    Here for routine f/u. Spanish interpreter accompanies him. Says he feels good. Relatively active without and SOB, CP, orthopnea or PND. Complaint with all meds. No bleeding on Xarelto.    Cardiac Testing  ECHO 01/24/2019 EF 20-25%  Echo 06/2018- LVEF is further decreased to 15% with severe global hypokinesis, inferior akinesis and septal dyskinesis.RV mildly reduced.  ECHO 2017 [place]- EF 20-25% RV mildly dilated. Severe hypokinesis.  RHC/ [facility] 2018 [facility] Nonobs LAD 50%,mild om2.  RA 12 PCWP 31  Review of systems complete and found to be negative unless listed in HPI.   SH:  Social History   Socioeconomic History  . Marital status: Married    Spouse name: Not on file  . Number of children: 5  . Years of education: 9  . Highest education level: Not on file  Occupational History  . Occupation: Ambulance person constrution    Comment: Barista  Tobacco Use  . Smoking status: Never Smoker  . Smokeless tobacco: Never Used  Substance and Sexual Activity  . Alcohol use: Yes   Alcohol/week: 0.0 standard drinks    Comment: 07/13/2018 "used to drink; none since 2015"  . Drug use: Never  . Sexual activity: Yes  Other Topics Concern  . Not on file  Social History Narrative   Originally from [place]   Came to [employer]. In 1995   Lives at home with wife, a son and his wife and 3 grandchildren.   Social Determinants of Health   Financial Resource Strain:   . Difficulty of Paying Living Expenses: Not on file  Food Insecurity:   . Worried About Charity fundraiser in the Last Year: Not on file  . Ran Out of Food in the Last Year: Not on file  Transportation Needs:   . Lack of Transportation (Medical): Not on file  . Lack of Transportation (Non-Medical): Not on file  Physical Activity:   . Days of Exercise per Week: Not on file  . Minutes of Exercise per Session: Not on file  Stress:   . Feeling of Stress : Not on file  Social Connections:   . Frequency of Communication with Friends and Family: Not on file  . Frequency of Social Gatherings with Friends and Family: Not on file  . Attends Religious Services: Not on file  . Active Member of Clubs or Organizations: Not on file  . Attends Archivist Meetings: Not on file  . Marital Status: Not on file  Intimate Partner Violence:   . Fear of Current or Ex-Partner:  Not on file  . Emotionally Abused: Not on file  . Physically Abused: Not on file  . Sexually Abused: Not on file    FH:  Family History  Problem Relation Age of Onset  . Stroke Mother   . Heart disease Mother   . Heart disease Father   . Diabetes Father   . Kidney disease Father        , history of stones as well  . Diabetes Brother   . Cancer Sister 14       unknown soft tissue cancer of leg    Past Medical History:  Diagnosis Date  . Arthritis    "knees" (07/13/2018)  . Atrial fibrillation ([place]) 2016  . CAD (coronary artery disease)   . High cholesterol   . Hypertension 2006  . Idiopathic cardiomyopathy ([place]) 11/01/2014    Followed by Dr. Acie Fredrickson, Cardiology  EF 25%  Adenosine Cardiolite did not support ischemic etiology  . OA (osteoarthritis) of knee 01/08/2017  . Pleural effusion   . Presence of permanent cardiac pacemaker 07/13/2018  . Refusal of blood transfusions as patient is Jehovah's Witness   . Type II diabetes mellitus ([place]) 2001    Current Outpatient Medications  Medication Sig Dispense Refill  . acetaminophen (TYLENOL) 650 MG CR tablet Take 650 mg by mouth every 8 (eight) hours as needed for pain. Reported on 02/14/2016    . atorvastatin (LIPITOR) 80 MG tablet TAKE 1 TABLET (80MG  TOTAL) BY MOUTH ONCE A DAY 30 tablet 11  . Blood Glucose Monitoring Suppl (AGAMATRIX PRESTO PRO METER) DEVI Twice daily blood glucose checks before meals 1 Device 0  . carvedilol (COREG) 6.25 MG tablet Take 1.5 tablets (9.375 mg total) by mouth 2 (two) times daily with a meal. 90 tablet 6  . digoxin (LANOXIN) 0.125 MG tablet Take 1 tablet (125 mcg total) by mouth daily. 30 tablet 6  . ferrous gluconate (FERGON) 324 MG tablet Take 1 tablet (324 mg total) by mouth daily with breakfast.  3  . furosemide (LASIX) 40 MG tablet Take 1.5 tablets (60 mg total) by mouth every morning AND 1 tablet (40 mg total) every evening. 90 tablet 11  . glucose blood (AGAMATRIX PRESTO TEST) test strip Twice daily glucose checks before meals 100 each 12  . Glycerin-Hypromellose-PEG 400 ([facility] DRY EYE RELIEF) 0.2-0.2-1 % SOLN Place 2 drops into both eyes as needed.    . sildenafil (VIAGRA) 100 MG tablet 1/2 to 1 tab by mouth 1/2 hour before activity, only 1 tab max in 24 hours 10 tablet 11  . XARELTO 20 MG TABS tablet TAKE 1 TABLET BY MOUTH ONCE DAILY WITH SUPPER 30 tablet 11   No current facility-administered medications for this encounter.    Vitals:   09/12/19 1444  BP: 126/69  Pulse: 75  SpO2: 98%  Weight: 112.7 kg (248 lb 6.4 oz)   Wt Readings from Last 3 Encounters:  09/06/19 111.6 kg (246 lb)  06/30/19 111.1 kg (245 lb)  06/10/19 111.9  kg (246 lb 9.6 oz)   PHYSICAL EXAM: Spanish interpreter present.  General:  Well appearing. No resp difficulty HEENT: normal Neck: supple. no JVD. Carotids 2+ bilat; no bruits. No lymphadenopathy or thryomegaly appreciated. Cor: PMI nondisplaced. Regular rate & rhythm. No rubs, gallops or murmurs. Lungs: clear Abdomen: soft, nontender, nondistended. No hepatosplenomegaly. No bruits or masses. Good bowel sounds. Extremities: no cyanosis, clubbing, rash, edema Neuro: alert & orientedx3, cranial nerves grossly intact. moves all 4 extremities w/o difficulty.  Affect pleasant  ECG 09/06/19: AF with v pacing 73. Personally reviewed  ICD interrogated personally in clinic: No VT. 96% BiV pacing.  Volume was up now back to baseline. 4hrs activity per day. Personally reviewed   ASSESSMENT & PLAN: 1. Chronic Systolic Heart Failure, NICM.  - LHC 2018 50% LAD.  - ECHO 06/2018 EF 15%  LA severely dilated. EF has been down for years.  -  ECHO 01/24/19 ---->20-25%. - 07/13/2018 S/P St Jude CRT-P.  - Doing well. NYHA I-II. - Volume status stable. He does not need lasix.   - Continue carvedilol 9.375 mg twice a day.   - Continue 0.125 mg digoxin daily. May be able to stop soon.  - Remains on losartan 25 though it has fallen off his list. Will increase to 50 daily. (no entresto, he is not a [place] citizen and has not insurance.) - Intolerant spiro/inspra due to gynecomastia.  - Refuses SGLT2i due to cost - Check labs today - Repeat echo in 6 months.   2. LBBB s/p St Jude CRT-P.  - Follows  With Dr Lovena Le.   Interrogation on high output mode this was turned off by ST jude rep to preserve battery.  3. Chronic A fib - Rate controlled.  - Continue xarelto 20 mg daily. Denies bleeding.    4. CKD Stage IIIa - creatinine runs 1.2-1.5 - check today  5. DMII - Per PCP.  - unable to afford SGLT2i  6. Clearance for colonoscopy - low risk for peri-op CV complications - ok to hold Xarelto for 48 hours  prior.   Glori Bickers, MD  2:44 PM

## 2019-09-20 ENCOUNTER — Other Ambulatory Visit (HOSPITAL_COMMUNITY): Payer: Self-pay

## 2019-10-03 ENCOUNTER — Ambulatory Visit: Payer: Self-pay | Attending: Internal Medicine

## 2019-10-07 ENCOUNTER — Other Ambulatory Visit (HOSPITAL_COMMUNITY): Payer: Self-pay | Admitting: Adult Health

## 2019-10-07 ENCOUNTER — Other Ambulatory Visit: Payer: Self-pay | Admitting: Internal Medicine

## 2019-10-07 ENCOUNTER — Other Ambulatory Visit: Payer: Self-pay

## 2019-10-10 ENCOUNTER — Ambulatory Visit: Payer: Self-pay | Attending: Family

## 2019-10-10 DIAGNOSIS — Z23 Encounter for immunization: Secondary | ICD-10-CM | POA: Insufficient documentation

## 2019-10-10 NOTE — Progress Notes (Signed)
   Covid-19 Vaccination Clinic  Name:  Kyle Burke    MRN: [id number] DOB: 01/15/1946  10/10/2019  [name] was observed post Covid-19 immunization for 15 minutes without incidence. He was provided with Vaccine Information Sheet and instruction to access the V-Safe system.   [name] was instructed to call 911 with any severe reactions post vaccine: Marland Kitchen Difficulty breathing  . Swelling of your face and throat  . A fast heartbeat  . A bad rash all over your body  . Dizziness and weakness    Immunizations Administered    Name Date Dose VIS Date Route   Moderna COVID-19 Vaccine 10/10/2019  3:12 PM 0.5 mL 07/19/2019 Intramuscular   Manufacturer: Moderna   Lot: [id number]   [id number]

## 2019-10-12 ENCOUNTER — Ambulatory Visit (INDEPENDENT_AMBULATORY_CARE_PROVIDER_SITE_OTHER): Payer: Self-pay | Admitting: *Deleted

## 2019-10-12 DIAGNOSIS — Z950 Presence of cardiac pacemaker: Secondary | ICD-10-CM

## 2019-10-12 LAB — CUP PACEART REMOTE DEVICE CHECK
Battery Remaining Longevity: 80 mo
Battery Remaining Percentage: 95.5 %
Battery Voltage: 2.98 V
Brady Statistic AP VP Percent: 97 %
Brady Statistic AP VS Percent: 0 %
Brady Statistic AS VP Percent: 1 %
Brady Statistic AS VS Percent: 1 %
Brady Statistic RA Percent Paced: 94 %
Date Time Interrogation Session: 20210224020010
Implantable Lead Implant Date: 20191126
Implantable Lead Implant Date: 20191126
Implantable Lead Implant Date: 20191126
Implantable Lead Location: 753858
Implantable Lead Location: 753860
Implantable Lead Location: 753860
Implantable Lead Model: 3830
Implantable Pulse Generator Implant Date: 20191126
Lead Channel Impedance Value: 560 Ohm
Lead Channel Impedance Value: 600 Ohm
Lead Channel Impedance Value: 600 Ohm
Lead Channel Pacing Threshold Amplitude: 0.5 V
Lead Channel Pacing Threshold Amplitude: 0.75 V
Lead Channel Pacing Threshold Amplitude: 1 V
Lead Channel Pacing Threshold Pulse Width: 0.5 ms
Lead Channel Pacing Threshold Pulse Width: 0.5 ms
Lead Channel Pacing Threshold Pulse Width: 1 ms
Lead Channel Sensing Intrinsic Amplitude: 1.4 mV
Lead Channel Sensing Intrinsic Amplitude: 8.8 mV
Lead Channel Setting Pacing Amplitude: 2 V
Lead Channel Setting Pacing Amplitude: 2 V
Lead Channel Setting Pacing Amplitude: 2.5 V
Lead Channel Setting Pacing Pulse Width: 0.5 ms
Lead Channel Setting Pacing Pulse Width: 0.5 ms
Lead Channel Setting Sensing Sensitivity: 0.7 mV
Pulse Gen Model: 3562
Pulse Gen Serial Number: 9060242

## 2019-10-13 NOTE — Progress Notes (Signed)
PPM Remote  

## 2019-10-25 ENCOUNTER — Other Ambulatory Visit: Payer: Self-pay | Admitting: Internal Medicine

## 2019-10-27 ENCOUNTER — Other Ambulatory Visit: Payer: Self-pay

## 2019-10-27 ENCOUNTER — Telehealth: Payer: Self-pay | Admitting: Internal Medicine

## 2019-10-27 MED ORDER — RIVAROXABAN 20 MG PO TABS: ORAL_TABLET | ORAL | 11 refills | Status: DC

## 2019-10-27 NOTE — Telephone Encounter (Signed)
Patient called requesting Rx on XARELTO 20 MG TABS tablet to be called in at [facility].

## 2019-10-27 NOTE — Telephone Encounter (Signed)
Patient informed. 

## 2019-10-27 NOTE — Telephone Encounter (Signed)
Rx  sent  to  [facility]

## 2019-11-08 ENCOUNTER — Ambulatory Visit: Payer: Self-pay | Attending: Family

## 2019-11-08 DIAGNOSIS — Z23 Encounter for immunization: Secondary | ICD-10-CM

## 2019-11-08 NOTE — Progress Notes (Signed)
   Covid-19 Vaccination Clinic  Name:  [name]    MRN: [id number] DOB: May 31, 1946  11/08/2019  Kyle Burke was observed post Covid-19 immunization for 15 minutes without incident. He was provided with Vaccine Information Sheet and instruction to access the V-Safe system.   Kyle Burke was instructed to call 911 with any severe reactions post vaccine: Marland Kitchen Difficulty breathing  . Swelling of face and throat  . A fast heartbeat  . A bad rash all over body  . Dizziness and weakness   Immunizations Administered    Name Date Dose VIS Date Route   Moderna COVID-19 Vaccine 11/08/2019 10:03 AM 0.5 mL 07/19/2019 Intramuscular   Manufacturer: Moderna   Lot: [id number]   [id number]

## 2019-11-10 ENCOUNTER — Other Ambulatory Visit: Payer: Self-pay

## 2019-11-10 MED ORDER — CARVEDILOL 6.25 MG PO TABS: ORAL_TABLET | ORAL | 11 refills | Status: DC

## 2019-11-22 ENCOUNTER — Ambulatory Visit: Payer: Self-pay

## 2019-12-29 ENCOUNTER — Other Ambulatory Visit: Payer: Self-pay

## 2019-12-29 ENCOUNTER — Encounter: Payer: Self-pay | Admitting: Internal Medicine

## 2019-12-29 ENCOUNTER — Ambulatory Visit: Payer: Self-pay | Admitting: Internal Medicine

## 2019-12-29 VITALS — BP 130/80 | HR 72 | Resp 12 | Ht 75.0 in | Wt 248.0 lb

## 2019-12-29 DIAGNOSIS — Z0000 Encounter for general adult medical examination without abnormal findings: Secondary | ICD-10-CM

## 2019-12-29 DIAGNOSIS — I10 Essential (primary) hypertension: Secondary | ICD-10-CM

## 2019-12-29 DIAGNOSIS — D649 Anemia, unspecified: Secondary | ICD-10-CM

## 2019-12-29 DIAGNOSIS — E1129 Type 2 diabetes mellitus with other diabetic kidney complication: Secondary | ICD-10-CM

## 2019-12-29 DIAGNOSIS — N183 Chronic kidney disease, stage 3 unspecified: Secondary | ICD-10-CM

## 2019-12-29 DIAGNOSIS — E1122 Type 2 diabetes mellitus with diabetic chronic kidney disease: Secondary | ICD-10-CM

## 2019-12-29 DIAGNOSIS — D126 Benign neoplasm of colon, unspecified: Secondary | ICD-10-CM

## 2019-12-29 DIAGNOSIS — R809 Proteinuria, unspecified: Secondary | ICD-10-CM

## 2019-12-29 DIAGNOSIS — Z125 Encounter for screening for malignant neoplasm of prostate: Secondary | ICD-10-CM

## 2019-12-29 DIAGNOSIS — Z79899 Other long term (current) drug therapy: Secondary | ICD-10-CM

## 2019-12-29 DIAGNOSIS — E785 Hyperlipidemia, unspecified: Secondary | ICD-10-CM

## 2019-12-29 DIAGNOSIS — I5022 Chronic systolic (congestive) heart failure: Secondary | ICD-10-CM

## 2019-12-29 MED ORDER — AGAMATRIX ULTRA-THIN LANCETS MISC: 11 refills | Status: DC

## 2019-12-29 MED ORDER — AGAMATRIX PRESTO TEST VI STRP: ORAL_STRIP | 12 refills | Status: DC

## 2019-12-29 MED ORDER — LOSARTAN POTASSIUM 50 MG PO TABS: 50.0000 mg | ORAL_TABLET | Freq: Every day | ORAL | 6 refills | Status: DC

## 2019-12-29 MED ORDER — JARDIANCE 10 MG PO TABS: 10.0000 mg | ORAL_TABLET | Freq: Every day | ORAL | 11 refills | Status: DC

## 2019-12-29 NOTE — Patient Instructions (Signed)

## 2019-12-29 NOTE — Progress Notes (Signed)
Subjective:    Patient ID: Kyle Burke, male   DOB: 27-Oct-1945, 74 y.o.   MRN: [id number]   HPI   Here for Male CPE:  1.  STE:  Does not perform.    2.  PSA: Last PSA 11/08/15 and normal.  No family history of prostate cancer.    3.  Guaiac Cards:  Last performed in 2017 and negative for occult blood.    4.  Colonoscopy:  Colonoscopy 04/2016 with Dr. Hilarie Fredrickson for anemia.  Several polyps found.  Tubular adenoma.  Gastroenterology working with Cardiology about when he can have follow up colonoscopy this year.  Cannot clarify with patient who has contacted him about this.    5.  Cholesterol/Glucose:  Last lipids at goal in March 2020.  A1C at goal at 6.1%  with diet control last checked 06/24/2019.  Has had long term good control.  6.  Immunizations:   Immunization History  Administered Date(s) Administered  . Influenza, High Dose Seasonal PF 10/26/2015  . Influenza,inj,Quad PF,6+ Mos 05/17/2017, 06/11/2018, 06/03/2019  . Influenza-Unspecified 05/16/2017, 06/02/2019  . Moderna SARS-COVID-2 Vaccination 10/10/2019, 11/08/2019  . Pneumococcal Conjugate-13 06/30/2019  . Pneumococcal Polysaccharide-23 08/21/2016  . Tdap 05/08/2016     Current Meds  Medication Sig  . acetaminophen (TYLENOL) 650 MG CR tablet Take 650 mg by mouth every 8 (eight) hours as needed for pain. Reported on 02/14/2016  . atorvastatin (LIPITOR) 80 MG tablet TAKE 1 TABLET (80MG  TOTAL) BY MOUTH ONCE A DAY  . Blood Glucose Monitoring Suppl (AGAMATRIX PRESTO PRO METER) DEVI Twice daily blood glucose checks before meals  . carvedilol (COREG) 6.25 MG tablet TAKE 1 AND 1/2 TABLETS (9.375MG ) BY MOUTH 2 TIMES A DAY WITH A MEAL  . digoxin (LANOXIN) 0.25 MG tablet TAKE 1/2 TABLET (0.125MG  TOTAL) BY MOUTH DAILY  . ferrous gluconate (FERGON) 324 MG tablet Take 1 tablet (324 mg total) by mouth daily with breakfast.  . furosemide (LASIX) 40 MG tablet Take 1.5 tablets (60 mg total) by mouth every morning AND 1 tablet (40  mg total) every evening.  Marland Kitchen glucose blood (AGAMATRIX PRESTO TEST) test strip Twice daily glucose checks before meals  . Glycerin-Hypromellose-PEG 400 ([facility] DRY EYE RELIEF) 0.2-0.2-1 % SOLN Place 2 drops into both eyes as needed.  Marland Kitchen losartan (COZAAR) 50 MG tablet Take 1 tablet (50 mg total) by mouth daily.  . rivaroxaban (XARELTO) 20 MG TABS tablet TAKE 1 TABLET BY MOUTH ONCE DAILY WITH SUPPER  . sildenafil (VIAGRA) 100 MG tablet TAKE ONE-HALF TO ONE TABLET BY MOUTH ONE-HALF HOUR BEFORE ACTIVITY. ONLY ONE TABLET MAX IN 24 HOURS   Allergies  Allergen Reactions  . Spironolactone Other (See Comments)    Gynecomastia   Past Medical History:  Diagnosis Date  . Arthritis    "knees" (07/13/2018)  . Atrial fibrillation ([place]) 2016  . CAD (coronary artery disease)   . High cholesterol   . Hypertension 2006  . 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 Acie Fredrickson, Cardiology  EF 25%  Adenosine Cardiolite did not support ischemic etiology  . OA (osteoarthritis) of knee 01/08/2017  . Pleural effusion   . Presence of permanent cardiac pacemaker 07/13/2018  . Refusal of blood transfusions as patient is Jehovah's Witness   . Type II diabetes mellitus ([place]) 2001    Past Surgical History:  Procedure Laterality Date  . BI-VENTRICULAR PACEMAKER INSERTION (CRT-P)  07/13/2018  . BIV PACEMAKER INSERTION CRT-P N/A 07/13/2018   Procedure: BIV PACEMAKER  INSERTION CRT-P;  Surgeon: Evans Lance, MD;  Location: [facility];  Service: Cardiovascular;  Laterality: N/A;  . COLONOSCOPY WITH PROPOFOL N/A 05/14/2016   Procedure: COLONOSCOPY WITH PROPOFOL;  Surgeon: Jerene Bears, MD;  Location: [facility];  Service: Gastroenterology;  Laterality: N/A;  . ESOPHAGOGASTRODUODENOSCOPY (EGD) WITH PROPOFOL N/A 05/14/2016   Procedure: ESOPHAGOGASTRODUODENOSCOPY (EGD) WITH PROPOFOL;  Surgeon: Jerene Bears, MD;  Location: [facility];  Service: Gastroenterology;  Laterality: N/A;  . INCISE AND DRAIN  ABCESS Left 1986   on leg  . INGUINAL HERNIA REPAIR  01/16/2012   Procedure: LAPAROSCOPIC INGUINAL HERNIA;  Surgeon: Gayland Curry, MD,FACS;  Location: [facility];  Service: General;  Laterality: Left;  Laparoscopic incarcerated Left inguinal hernia repair with mesh, umbilical hernia repair  . UMBILICAL HERNIA REPAIR  01/16/2012   Procedure: HERNIA REPAIR UMBILICAL ADULT;  Surgeon: Gayland Curry, MD,FACS;  Location: [facility];  Service: General;  Laterality: N/A;   Family History  Problem Relation Age of Onset  . Stroke Mother   . Heart disease Mother   . Heart disease Father   . Diabetes Father   . Kidney disease Father        , history of stones as well  . Diabetes Brother   . Cancer Sister 30       unknown soft tissue cancer of leg  . Depression Sister   . Varicose Veins Son      Social History   Socioeconomic History  . Marital status: Married    Spouse name: Verdis Frederickson  . Number of children: 5  . Years of education: 9  . Highest education level: Not on file  Occupational History  . Occupation: Concrete constrution-retired    Comment: Barista  Tobacco Use  . Smoking status: Never Smoker  . Smokeless tobacco: Never Used  Substance and Sexual Activity  . Alcohol use: Not Currently    Alcohol/week: 0.0 standard drinks    Comment: 07/13/2018 "used to drink; none since 2015"  . Drug use: Never  . Sexual activity: Yes    Comment: Viagra helps a bit.  Other Topics Concern  . Not on file  Social History Narrative   C[place] In [place]   Lives at home with wife, a son and his wife and 3 grandchildren.   Social Determinants of Health   Financial Resource Strain: Low Risk   . Difficulty of Paying Living Expenses: Not hard at all  Food Insecurity: No Food Insecurity  . Worried About Charity fundraiser in the Last Year: Never true  . Ran Out of Food in the Last Year: Never true  Transportation Needs: No Transportation Needs  . Lack of Transportation (Medical): No  . Lack  of Transportation (Non-Medical): No  Physical Activity:   . Days of Exercise per Week:   . Minutes of Exercise per Session:   Stress:   . Feeling of Stress :   Social Connections:   . Frequency of Communication with Friends and Family:   . Frequency of Social Gatherings with Friends and Family:   . Attends Religious Services:   . Active Member of Clubs or Organizations:   . Attends Archivist Meetings:   Marland Kitchen Marital Status:   Intimate Partner Violence: Not At Risk  . Fear of Current or Ex-Partner: No  . Emotionally Abused: No  . Physically Abused: No  . Sexually Abused: No     Review of Systems  Constitutional: Negative  for appetite change, fatigue, fever and unexpected weight change (Has gained a couple of pounds, but no swelling of legs.).  HENT: Negative for dental problem, hearing loss, sneezing and sore throat.   Eyes: Negative for visual disturbance (Had eye check this past year.  An office in [place].  States was told everything was okay.).  Respiratory: Negative for cough and shortness of breath.   Cardiovascular: Negative for chest pain, palpitations and leg swelling.  Gastrointestinal: Negative for abdominal pain, blood in stool (No melena.), constipation and diarrhea.  Genitourinary: Negative for decreased urine volume (Good stream, even with nocturia.  No hesitancy.  ), dysuria, frequency (Though does have nocturia x 3.  ) and testicular pain.  Musculoskeletal: Positive for arthralgias (OA of knees with chronic discomfort.  No change.).  Skin: Positive for rash (mild poison ivy on his hands and forearms.).  Neurological: Negative for weakness and numbness.       Checks feet nightly.  Psychiatric/Behavioral: Negative for dysphoric mood. The patient is not nervous/anxious.       Objective:   BP 130/80 (BP Location: Left Arm, Patient Position: Sitting, Cuff Size: Large)   Pulse 72   Resp 12   Ht 6\' 3"  (1.905 m)   Wt 248 lb (112.5 kg)   BMI 31.00  kg/m   Physical Exam  Constitutional: He is oriented to person, place, and time. He appears well-developed and well-nourished.  HENT:  Head: Normocephalic and atraumatic.  Right Ear: Tympanic membrane, external ear and ear canal normal.  Left Ear: Tympanic membrane, external ear and ear canal normal.  Nose: Nose normal.  Mouth/Throat: Uvula is midline, oropharynx is clear and moist and mucous membranes are normal.  Eyes: Pupils are equal, round, and reactive to light. Conjunctivae and EOM are normal.  Neck: No thyroid mass and no thyromegaly present.  Cardiovascular: Normal rate and regular rhythm. Exam reveals no S3, no S4 and no friction rub.  No murmur heard. No carotid bruits.  Carotid, radial, femoral, DP and PT pulses normal and equal.   Pulmonary/Chest: Effort normal and breath sounds normal.  Abdominal: Soft. Bowel sounds are normal. He exhibits no mass. There is no hepatosplenomegaly. There is no abdominal tenderness. No hernia. Hernia confirmed negative in the right inguinal area and confirmed negative in the left inguinal area.  Genitourinary:    Penis normal.  Right testis shows no mass, no swelling and no tenderness. Right testis is descended. Left testis shows no mass, no swelling and no tenderness. Left testis is descended. Uncircumcised.  Musculoskeletal:        General: Deformity (Varus deformity of knees bilaterally with hypertrophic changes) present.     Cervical back: Full passive range of motion without pain, normal range of motion and neck supple.  Lymphadenopathy:       Head (right side): No submental and no submandibular adenopathy present.       Head (left side): No submental and no submandibular adenopathy present.    He has no cervical adenopathy.    He has no axillary adenopathy.       Right: No inguinal and no supraclavicular adenopathy present.       Left: No inguinal and no supraclavicular adenopathy present.  Neurological: He is alert and oriented to  person, place, and time. He has normal strength and normal reflexes. No cranial nerve deficit or sensory deficit. Coordination and gait normal.  Skin: Skin is warm. No rash noted.  Psychiatric: He has a normal mood and affect.  His speech is normal and behavior is normal. Judgment and thought content normal.     Assessment & Plan   1.  CPE CBC, CMP, A1C, FLP, urine microalbumin/crea, PSA Needs 2nd Pneumococcal 23 vaccine 06/2022 for completion. Influenza vaccine in Sept/Oct.  2.  Adenomatous colon polyps in 2017:  Will check with Dr. Vena Rua office regarding follow up  3.  Cardiomyopathy, afib,  with CHF:   CRT-P, Carvedilol, Digoxin.  No spironolactone as had gynecomastia with med.  Encouraged patient to get better with diet and consider water exercise to spare his knees.  4.  DM/dyslipidemia:  Labs as above.  Adding SGLT2 inhibitor Jardiance through [facility] at [place].  They have samples there to get him started.  Will see if helps with CHF issues as well.  He will check his sugars twice daily following start to be sure he does not have hypoglycemic episodes. Follow up in 3 months. Discussed if he is ill and unable to keep fluids down well, needs to hold the Jardiance.  5.  OA of knees:  stable

## 2019-12-30 LAB — LIPID PANEL W/O CHOL/HDL RATIO
Cholesterol, Total: 114 mg/dL (ref 100–199)
HDL: 40 mg/dL (ref >39)
LDL Chol Calc (NIH): 55 mg/dL (ref 0–99)
Triglycerides: 100 mg/dL (ref 0–149)
VLDL Cholesterol Cal: 19 mg/dL (ref 5–40)

## 2019-12-30 LAB — MICROALBUMIN / CREATININE URINE RATIO
Creatinine, Urine: 134.3 mg/dL
Microalb/Creat Ratio: 5 mg/g creat (ref 0–29)
Microalbumin, Urine: 6.6 ug/mL

## 2019-12-30 LAB — COMPREHENSIVE METABOLIC PANEL
ALT: 16 IU/L (ref 0–44)
AST: 17 IU/L (ref 0–40)
Albumin/Globulin Ratio: 1.6 (ref 1.2–2.2)
Albumin: 4.7 g/dL (ref 3.7–4.7)
Alkaline Phosphatase: 109 IU/L (ref 39–117)
BUN/Creatinine Ratio: 16 (ref 10–24)
BUN: 19 mg/dL (ref 8–27)
Bilirubin Total: 1.2 mg/dL (ref 0.0–1.2)
CO2: 28 mmol/L (ref 20–29)
Calcium: 9.6 mg/dL (ref 8.6–10.2)
Chloride: 100 mmol/L (ref 96–106)
Creatinine, Ser: 1.18 mg/dL (ref 0.76–1.27)
GFR calc Af Amer: 71 mL/min/{1.73_m2} (ref >59)
GFR calc non Af Amer: 61 mL/min/{1.73_m2} (ref >59)
Globulin, Total: 3 g/dL (ref 1.5–4.5)
Glucose: 124 mg/dL — ABNORMAL HIGH (ref 65–99)
Potassium: 4.8 mmol/L (ref 3.5–5.2)
Sodium: 141 mmol/L (ref 134–144)
Total Protein: 7.7 g/dL (ref 6.0–8.5)

## 2019-12-30 LAB — PSA: Prostate Specific Ag, Serum: 1.8 ng/mL (ref 0.0–4.0)

## 2019-12-30 LAB — CBC WITH DIFFERENTIAL/PLATELET
Basophils Absolute: 0 10*3/uL (ref 0.0–0.2)
Basos: 1 %
EOS (ABSOLUTE): 0.2 10*3/uL (ref 0.0–0.4)
Eos: 3 %
Hematocrit: 40.3 % (ref 37.5–51.0)
Hemoglobin: 13.4 g/dL (ref 13.0–17.7)
Immature Grans (Abs): 0 10*3/uL (ref 0.0–0.1)
Immature Granulocytes: 0 %
Lymphocytes Absolute: 1.4 10*3/uL (ref 0.7–3.1)
Lymphs: 25 %
MCH: 31.5 pg (ref 26.6–33.0)
MCHC: 33.3 g/dL (ref 31.5–35.7)
MCV: 95 fL (ref 79–97)
Monocytes Absolute: 0.6 10*3/uL (ref 0.1–0.9)
Monocytes: 10 %
Neutrophils Absolute: 3.6 10*3/uL (ref 1.4–7.0)
Neutrophils: 61 %
Platelets: 178 10*3/uL (ref 150–450)
RBC: 4.25 x10E6/uL (ref 4.14–5.80)
RDW: 13.1 % (ref 11.6–15.4)
WBC: 5.8 10*3/uL (ref 3.4–10.8)

## 2019-12-30 LAB — HGB A1C W/O EAG: Hgb A1c MFr Bld: 6.5 % — ABNORMAL HIGH (ref 4.8–5.6)

## 2020-01-11 ENCOUNTER — Ambulatory Visit (INDEPENDENT_AMBULATORY_CARE_PROVIDER_SITE_OTHER): Payer: Self-pay | Admitting: *Deleted

## 2020-01-11 DIAGNOSIS — I5022 Chronic systolic (congestive) heart failure: Secondary | ICD-10-CM

## 2020-01-11 LAB — CUP PACEART REMOTE DEVICE CHECK
Battery Remaining Longevity: 82 mo
Battery Remaining Percentage: 95.5 %
Battery Voltage: 2.98 V
Brady Statistic AP VP Percent: 96 %
Brady Statistic AP VS Percent: 0 %
Brady Statistic AS VP Percent: 1 %
Brady Statistic AS VS Percent: 1 %
Brady Statistic RA Percent Paced: 93 %
Date Time Interrogation Session: 20210526020007
Implantable Lead Implant Date: 20191126
Implantable Lead Implant Date: 20191126
Implantable Lead Implant Date: 20191126
Implantable Lead Location: 753858
Implantable Lead Location: 753860
Implantable Lead Location: 753860
Implantable Lead Model: 3830
Implantable Pulse Generator Implant Date: 20191126
Lead Channel Impedance Value: 550 Ohm
Lead Channel Impedance Value: 600 Ohm
Lead Channel Impedance Value: 660 Ohm
Lead Channel Pacing Threshold Amplitude: 0.5 V
Lead Channel Pacing Threshold Amplitude: 0.75 V
Lead Channel Pacing Threshold Amplitude: 1 V
Lead Channel Pacing Threshold Pulse Width: 0.5 ms
Lead Channel Pacing Threshold Pulse Width: 0.5 ms
Lead Channel Pacing Threshold Pulse Width: 1 ms
Lead Channel Sensing Intrinsic Amplitude: 1.3 mV
Lead Channel Sensing Intrinsic Amplitude: 8.8 mV
Lead Channel Setting Pacing Amplitude: 2 V
Lead Channel Setting Pacing Amplitude: 2 V
Lead Channel Setting Pacing Amplitude: 2.5 V
Lead Channel Setting Pacing Pulse Width: 0.5 ms
Lead Channel Setting Pacing Pulse Width: 0.5 ms
Lead Channel Setting Sensing Sensitivity: 0.7 mV
Pulse Gen Model: 3562
Pulse Gen Serial Number: 9060242

## 2020-01-12 NOTE — Progress Notes (Signed)
Remote pacemaker transmission.   

## 2020-03-13 ENCOUNTER — Ambulatory Visit: Payer: Self-pay | Admitting: Gastroenterology

## 2020-03-14 ENCOUNTER — Telehealth: Payer: Self-pay | Admitting: Internal Medicine

## 2020-03-14 NOTE — Telephone Encounter (Signed)
To Dr. Mulberry for approval 

## 2020-03-14 NOTE — Telephone Encounter (Signed)
Patient called stating has been consulting Dr. Domingo Dimes [facility] and [place] who is taking care of his knee problems x a month. Patient stated Dr. Lacretia Nicks is in [place] and is meeting with him by video calls. Patient stated Dr. Lacretia Nicks is requesting pt's knees x-rays to move forward with patient's care.  Please advise.

## 2020-03-29 ENCOUNTER — Encounter: Payer: Self-pay | Admitting: Internal Medicine

## 2020-03-29 ENCOUNTER — Ambulatory Visit: Payer: Self-pay | Admitting: Internal Medicine

## 2020-03-29 VITALS — BP 98/62 | HR 94 | Resp 12 | Ht 75.0 in | Wt 247.0 lb

## 2020-03-29 DIAGNOSIS — M170 Bilateral primary osteoarthritis of knee: Secondary | ICD-10-CM

## 2020-03-29 DIAGNOSIS — I10 Essential (primary) hypertension: Secondary | ICD-10-CM

## 2020-03-29 DIAGNOSIS — I5022 Chronic systolic (congestive) heart failure: Secondary | ICD-10-CM

## 2020-03-29 DIAGNOSIS — N183 Chronic kidney disease, stage 3 unspecified: Secondary | ICD-10-CM

## 2020-03-29 DIAGNOSIS — E1122 Type 2 diabetes mellitus with diabetic chronic kidney disease: Secondary | ICD-10-CM

## 2020-03-29 NOTE — Progress Notes (Signed)
Subjective:    Patient ID: Kyle Burke, male   DOB: [birth year], 74 y.o.   MRN: [id number]   HPI   Interpreted by Basilio Cairo  1.  Bilateral knee pain:  Having a lot of bilateral knee and upper calf pain.  He is seeing a doctor virtually from [place] who would like xrays to see what is wrong with his knees.  He states the specialist is an ortho doc in [place] and would be sending him medication for this. He has xrays of knees from 2019 showing significant DJD and was seen that same year for same symptoms.  No PAD on arterial evaluation.  Does have chronic venous insufficiency for which he generally wears compression stockings with mild improvement.   Was seen by Biagio Borg, [facility] in 2019, but does not appear he followed up once PAD was not found.  Also seen by Dr. Scot Dock, [facility], where work up showed normal arterial flow and felt to have venous insufficiency as part of his leg pain.  Compression stockings ordered.    2.  DM:  He is taking Jardiance 10 mg daily since May, but is having problems getting his application for [facility] to go through as cannot get his letter from IRS documenting he does not file taxes.  He cannot say his blood sugars are as well controlled as were on Metformin. Has had eye exam this year.    3.  Non ischemic cardiomyopathy:  Energy and breathing fine.  No chest pain.  No palpitations with his afib.  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  Current Meds  Medication Sig  . acetaminophen (TYLENOL) 650 MG CR tablet Take 650 mg by mouth every 8 (eight) hours as needed for pain. Reported on 02/14/2016  . AgaMatrix Ultra-Thin Lancets MISC Check blood glucose twice daily before meals  . atorvastatin (LIPITOR) 80 MG tablet TAKE 1 TABLET (80MG  TOTAL) BY MOUTH ONCE A DAY  . Blood Glucose Monitoring Suppl (AGAMATRIX PRESTO PRO METER) DEVI Twice daily blood glucose checks before meals  .  carvedilol (COREG) 6.25 MG tablet TAKE 1 AND 1/2 TABLETS (9.375MG ) BY MOUTH 2 TIMES A DAY WITH A MEAL  . digoxin (LANOXIN) 0.25 MG tablet TAKE 1/2 TABLET (0.125MG  TOTAL) BY MOUTH DAILY  . empagliflozin (JARDIANCE) 10 MG TABS tablet Take 10 mg by mouth daily before breakfast.  . ferrous gluconate (FERGON) 324 MG tablet Take 1 tablet (324 mg total) by mouth daily with breakfast.  . furosemide (LASIX) 40 MG tablet Take 1.5 tablets (60 mg total) by mouth every morning AND 1 tablet (40 mg total) every evening.  Marland Kitchen glucose blood (AGAMATRIX PRESTO TEST) test strip Twice daily glucose checks before meals  . Glycerin-Hypromellose-PEG 400 ([facility] DRY EYE RELIEF) 0.2-0.2-1 % SOLN Place 2 drops into both eyes as needed.  Marland Kitchen losartan (COZAAR) 50 MG tablet Take 1 tablet (50 mg total) by mouth daily.  . rivaroxaban (XARELTO) 20 MG TABS tablet TAKE 1 TABLET BY MOUTH ONCE DAILY WITH SUPPER  . sildenafil (VIAGRA) 100 MG tablet TAKE ONE-HALF TO ONE TABLET BY MOUTH ONE-HALF HOUR BEFORE ACTIVITY. ONLY ONE TABLET MAX IN 24 HOURS   Allergies  Allergen Reactions  . Spironolactone Other (See Comments)    Gynecomastia     Review of Systems    Objective:   BP 98/62 (BP Location: Left Arm, Patient Position: Sitting, Cuff Size: Large)   Pulse 94   Resp 12  Ht 6\' 3"  (1.905 m)   Wt 247 lb (112 kg)   BMI 30.87 kg/m   Physical Exam  NAD HEENT:  PERRL, EOMI Neck:  Supple, No adenopathy Chest:  CTA CV:  RRR without murmur or rub.  Radial and DP pulses normal and equal LE:  Hypertrophic changes to bilateral knees with varus deformity.  Varicosities of bilateral LE. Diabetic foot exam was performed with the following findings:   No deformities, ulcerations, or other skin breakdown Normal sensation of 10g monofilament Intact posterior tibialis and dorsalis pedis pulses Prominent veins /spider veins of legs and feet.       Assessment & Plan   1.  Bilateral knee OA/DJD:  Discussed his previous work up and my  concerns for him obtaining meds from a physicaian in another courntry who does not know his history or hie other many meds well.  Re refer back to [facility]agio [facility] at [facility].  2.  Varicosities of LE:  Compression stockings and elevation.  3.  DM:  Jardiance.  Also to look into water exercise or stationary bike to prevent knee pain with physical activity.  A1C.  Diabetic eye exam  Encouraged influenza vaccine next month.

## 2020-03-30 LAB — HGB A1C W/O EAG: Hgb A1c MFr Bld: 6.8 % — ABNORMAL HIGH (ref 4.8–5.6)

## 2020-04-04 ENCOUNTER — Ambulatory Visit: Payer: Self-pay | Admitting: Gastroenterology

## 2020-04-10 ENCOUNTER — Ambulatory Visit: Payer: Self-pay

## 2020-04-10 ENCOUNTER — Ambulatory Visit (INDEPENDENT_AMBULATORY_CARE_PROVIDER_SITE_OTHER): Payer: Self-pay | Admitting: Orthopaedic Surgery

## 2020-04-10 ENCOUNTER — Encounter: Payer: Self-pay | Admitting: Orthopaedic Surgery

## 2020-04-10 ENCOUNTER — Ambulatory Visit (INDEPENDENT_AMBULATORY_CARE_PROVIDER_SITE_OTHER): Payer: Self-pay

## 2020-04-10 VITALS — Ht 73.0 in | Wt 253.0 lb

## 2020-04-10 DIAGNOSIS — M170 Bilateral primary osteoarthritis of knee: Secondary | ICD-10-CM

## 2020-04-10 DIAGNOSIS — G8929 Other chronic pain: Secondary | ICD-10-CM

## 2020-04-10 DIAGNOSIS — M25562 Pain in left knee: Secondary | ICD-10-CM

## 2020-04-10 DIAGNOSIS — M25561 Pain in right knee: Secondary | ICD-10-CM

## 2020-04-10 MED ORDER — METHYLPREDNISOLONE ACETATE 40 MG/ML IJ SUSP
80.0000 mg | INTRAMUSCULAR | Status: AC | PRN
  Administered 2020-04-10: 80 mg via INTRA_ARTICULAR

## 2020-04-10 MED ORDER — BUPIVACAINE HCL 0.5 % IJ SOLN
2.0000 mL | INTRAMUSCULAR | Status: AC | PRN
  Administered 2020-04-10: 2 mL via INTRA_ARTICULAR

## 2020-04-10 MED ORDER — LIDOCAINE HCL 1 % IJ SOLN
2.0000 mL | INTRAMUSCULAR | Status: AC | PRN
  Administered 2020-04-10: 2 mL

## 2020-04-10 NOTE — Progress Notes (Signed)
Office Visit Note   Patient: Kyle Burke           Date of Birth: [birth year]           MRN: [id number] Visit Date: 04/10/2020              Requested by: Mack Hook, MD [place],   [id number] PCP: Mack Hook, MD   Assessment & Plan: Visit Diagnoses:  1. Chronic pain of both knees   2. Primary osteoarthritis of both knees     Plan: Long discussion with interpreter in the room regarding end-stage osteoarthritis both knees.  Discussed use of over-the-counter medicines and cortisone injection.  Also briefly discussed knee replacement.  Kyle Burke would prefer to follow a nonoperative course.  Will inject both knees with cortisone today and plan to see him back as needed all questions were answered through the interpreter  Follow-Up Instructions: Return if symptoms worsen or fail to improve.   Orders:  Orders Placed This Encounter  Procedures  . Large Joint Inj: bilateral knee  . XR KNEE 3 VIEW LEFT  . XR KNEE 3 VIEW RIGHT   No orders of the defined types were placed in this encounter.     Procedures: Large Joint Inj: bilateral knee on 04/10/2020 1:42 PM Indications: diagnostic evaluation Details: 25 G 1.5 in needle, anteromedial approach  Arthrogram: No  Medications (Right): 2 mL lidocaine 1 %; 2 mL bupivacaine 0.5 %; 80 mg methylPREDNISolone acetate 40 MG/ML Medications (Left): 2 mL lidocaine 1 %; 2 mL bupivacaine 0.5 %; 80 mg methylPREDNISolone acetate 40 MG/ML Consent was given by the patient. Immediately prior to procedure a time out was called to verify the correct patient, procedure, equipment, support staff and site/side marked as required. Patient was prepped and draped in the usual sterile fashion.       Clinical Data: No additional findings.   Subjective: Chief Complaint  Patient presents with  . Right Knee - Pain  . Left Knee - Pain  Patient presents today for bilateral knee pain. They have been hurting for  about 3 years. No known injury. Both knees hurt equally the same. The pain is located all throughout his knee, including posteriorly. He feels some grinding with walking, but worse with going up or down stairs. No swelling. He almost never takes anything for pain, except occasional Tylenol. No previous surgery or treatment.  Had films of both knees in 2019 demonstrating end-stage changes of arthritis.  Has been taking Tylenol for pain.  No trouble sleeping.  Some compromise of his activities but able to perform most of his normal duties during the day.  Occasionally has a sensation of his knee giving way.  Not much swelling.  Number of comorbidities are being treated and stable  HPI  Review of Systems  Constitutional: Negative for fatigue.  HENT: Negative for ear pain.   Eyes: Negative for pain.  Respiratory: Negative for shortness of breath.   Cardiovascular: Negative for leg swelling.  Gastrointestinal: Negative for constipation and diarrhea.  Endocrine: Negative for cold intolerance and heat intolerance.  Genitourinary: Negative for difficulty urinating.  Musculoskeletal: Negative for joint swelling.  Skin: Negative for rash.  Allergic/Immunologic: Negative for food allergies.  Neurological: Negative for weakness.  Hematological: Does not bruise/bleed easily.  Psychiatric/Behavioral: Negative for sleep disturbance.     Objective: Vital Signs: Ht 6\' 1"  (1.854 m)   Wt 253 lb (114.8 kg)   BMI 33.38 kg/m   Physical Exam  Constitutional:      Appearance: He is well-developed.  Eyes:     Pupils: Pupils are equal, round, and reactive to light.  Pulmonary:     Effort: Pulmonary effort is normal.  Skin:    General: Skin is warm and dry.  Neurological:     Mental Status: He is alert and oriented to person, place, and time.  Psychiatric:        Behavior: Behavior normal.     Ortho Exam bilateral varus of the knees with predominant medial joint pain.  No effusion.  Lacks just a few  degrees to full extension bilaterally but flexed over 105 degrees without instability.  No grinding or crepitation.  Does have some mild venous stasis changes distally without edema.  +1 pulses painless range of motion both hips.  Straight leg raise negative.  Positive patellar grinding but without pain with compression. Specialty Comments:  No specialty comments available.  Imaging: XR KNEE 3 VIEW LEFT  Result Date: 04/10/2020 Films of the left knee were obtained in 3 projections standing and compared to the right knee films.  Findings are similar in the left knee compared to the right with end-stage osteoarthritis in all 3 compartments but predominantly in the medial compartment.  Approximately 8 degrees of varus with subchondral sclerosis, peripheral osteophytes and bone-on-bone.  There may be loose bodies both anteriorly and posteriorly.  Narrowing of the medial lateral patella facets with osteophytes.  Subchondral sclerosis and osteophytes in the lateral compartment as well  XR KNEE 3 VIEW RIGHT  Result Date: 04/10/2020 Films of the right knee were obtained in 3 projections standing.  There are end-stage osteoarthritic changes in all 3 compartments.  Predominant findings are bone-on-bone in the medial compartment with subchondral sclerosis and peripheral osteophytes.  Approximately 8 degrees of varus.  There may be loose bodies both posteriorly and anteriorly.    PMFS History: Patient Active Problem List   Diagnosis Date Noted  . Adenomatous polyp of colon 12/29/2019  . History of colonic polyps 06/10/2019  . Chronic anticoagulation 06/10/2019  . Pacemaker 07/13/2018  . OA (osteoarthritis) of knee 01/08/2017  . Mild CAD 08/12/2016  . Benign neoplasm of descending colon   . Benign neoplasm of transverse colon   . Heme + stool   . Gastritis and gastroduodenitis   . Acute on chronic systolic CHF (congestive heart failure) ([place]) 02/09/2016  . Essential hypertension 06/15/2015  .  Normocytic anemia 04/07/2015  . Controlled type 2 diabetes mellitus with stage 3 chronic kidney disease, without long-term current use of insulin ([employer])   . Dyslipidemia   . Microalbuminuria due to type 2 diabetes mellitus ([place])   . Chronic systolic CHF (congestive heart failure) ([place]) 12/15/2014  . Atrial fibrillation ([place]) 09/06/2014  . HTN (hypertension) 09/06/2014  . LBBB (left bundle branch block) 09/06/2014   Past Medical History:  Diagnosis Date  . Arthritis    "knees" (07/13/2018)  . Atrial fibrillation ([place]) 2016  . CAD (coronary artery disease)   . High cholesterol   . Hypertension 2006  . Idiopathic cardiomyopathy ([place]) 11/01/2014   Followed by Dr. Acie Fredrickson, Cardiology  EF 25%  Adenosine Cardiolite did not support ischemic etiology  . OA (osteoarthritis) of knee 01/08/2017  . Pleural effusion   . Presence of permanent cardiac pacemaker 07/13/2018  . Refusal of blood transfusions as patient is Jehovah's Witness   . Type II diabetes mellitus ([place]) 2001    Family History  Problem Relation Age of Onset  .  Stroke Mother   . Heart disease Mother   . Heart disease Father   . Diabetes Father   . Kidney disease Father        , history of stones as well  . Diabetes Brother   . Cancer Sister 52       unknown soft tissue cancer of leg  . Depression Sister   . Varicose Veins Son     Past Surgical History:  Procedure Laterality Date  . BI-VENTRICULAR PACEMAKER INSERTION (CRT-P)  07/13/2018  . BIV PACEMAKER INSERTION CRT-P N/A 07/13/2018   Procedure: BIV PACEMAKER INSERTION CRT-P;  Surgeon: Evans Lance, MD;  Location: [facility];  Service: Cardiovascular;  Laterality: N/A;  . COLONOSCOPY WITH PROPOFOL N/A 05/14/2016   Procedure: COLONOSCOPY WITH PROPOFOL;  Surgeon: Jerene Bears, MD;  Location: [facility];  Service: Gastroenterology;  Laterality: N/A;  . ESOPHAGOGASTRODUODENOSCOPY (EGD) WITH PROPOFOL N/A 05/14/2016   Procedure: ESOPHAGOGASTRODUODENOSCOPY (EGD) WITH  PROPOFOL;  Surgeon: Jerene Bears, MD;  Location: [facility];  Service: Gastroenterology;  Laterality: N/A;  . INCISE AND DRAIN ABCESS Left 1986   on leg  . INGUINAL HERNIA REPAIR  01/16/2012   Procedure: LAPAROSCOPIC INGUINAL HERNIA;  Surgeon: Gayland Curry, MD,FACS;  Location: [facility];  Service: General;  Laterality: Left;  Laparoscopic incarcerated Left inguinal hernia repair with mesh, umbilical hernia repair  . UMBILICAL HERNIA REPAIR  01/16/2012   Procedure: HERNIA REPAIR UMBILICAL ADULT;  Surgeon: Gayland Curry, MD,FACS;  Location: [facility];  Service: General;  Laterality: N/A;   Social History   Occupational History  . Occupation: Concrete constrution-retired    Comment: Barista  Tobacco Use  . Smoking status: Never Smoker  . Smokeless tobacco: Never Used  Vaping Use  . Vaping Use: Never used  Substance and Sexual Activity  . Alcohol use: Not Currently    Alcohol/week: 0.0 standard drinks    Comment: 07/13/2018 "used to drink; none since 2015"  . Drug use: Never  . Sexual activity: Yes    Comment: Viagra helps a bit.

## 2020-04-11 ENCOUNTER — Ambulatory Visit (INDEPENDENT_AMBULATORY_CARE_PROVIDER_SITE_OTHER): Payer: Self-pay | Admitting: *Deleted

## 2020-04-11 DIAGNOSIS — I5022 Chronic systolic (congestive) heart failure: Secondary | ICD-10-CM

## 2020-04-13 LAB — CUP PACEART REMOTE DEVICE CHECK
Battery Remaining Longevity: 82 mo
Battery Remaining Percentage: 95.5 %
Battery Voltage: 2.98 V
Brady Statistic AP VP Percent: 96 %
Brady Statistic AP VS Percent: 0 %
Brady Statistic AS VP Percent: 1 %
Brady Statistic AS VS Percent: 1 %
Brady Statistic RA Percent Paced: 93 %
Date Time Interrogation Session: 20210825020009
Implantable Lead Implant Date: 20191126
Implantable Lead Implant Date: 20191126
Implantable Lead Implant Date: 20191126
Implantable Lead Location: 753858
Implantable Lead Location: 753860
Implantable Lead Location: 753860
Implantable Lead Model: 3830
Implantable Pulse Generator Implant Date: 20191126
Lead Channel Impedance Value: 550 Ohm
Lead Channel Impedance Value: 600 Ohm
Lead Channel Impedance Value: 660 Ohm
Lead Channel Pacing Threshold Amplitude: 0.5 V
Lead Channel Pacing Threshold Amplitude: 0.75 V
Lead Channel Pacing Threshold Amplitude: 1 V
Lead Channel Pacing Threshold Pulse Width: 0.5 ms
Lead Channel Pacing Threshold Pulse Width: 0.5 ms
Lead Channel Pacing Threshold Pulse Width: 1 ms
Lead Channel Sensing Intrinsic Amplitude: 0.7 mV
Lead Channel Sensing Intrinsic Amplitude: 9.4 mV
Lead Channel Setting Pacing Amplitude: 2 V
Lead Channel Setting Pacing Amplitude: 2 V
Lead Channel Setting Pacing Amplitude: 2.5 V
Lead Channel Setting Pacing Pulse Width: 0.5 ms
Lead Channel Setting Pacing Pulse Width: 0.5 ms
Lead Channel Setting Sensing Sensitivity: 0.7 mV
Pulse Gen Model: 3562
Pulse Gen Serial Number: 9060242

## 2020-04-17 NOTE — Progress Notes (Signed)
Remote pacemaker transmission.   

## 2020-05-10 ENCOUNTER — Other Ambulatory Visit: Payer: Self-pay

## 2020-05-10 ENCOUNTER — Telehealth: Payer: Self-pay | Admitting: Internal Medicine

## 2020-05-10 MED ORDER — DIGOXIN 250 MCG PO TABS: ORAL_TABLET | ORAL | 11 refills | Status: DC

## 2020-05-10 NOTE — Telephone Encounter (Signed)
Please notify patient Rx has been sent to [facility]

## 2020-05-10 NOTE — Telephone Encounter (Signed)
Patient called requesting refill for   Digoxin (LANOXIN) 0.25 MG tablet 15 tablet    TAKE 1/2 TABLET (0.125MG  TOTAL) BY MOUTH DAILY  Please send to [facility] pharmacy and notify patient.

## 2020-05-11 NOTE — Telephone Encounter (Signed)
Patient notified

## 2020-05-18 ENCOUNTER — Telehealth: Payer: Self-pay | Admitting: Orthopaedic Surgery

## 2020-05-18 NOTE — Telephone Encounter (Signed)
Patient came in office for records as he signed release. He speaks minimal english. I tried to communicate that I mailed the records to him. He wanted the xray pictures. I printed on paper for him. This is what he wanted.

## 2020-06-06 ENCOUNTER — Telehealth: Payer: Self-pay | Admitting: *Deleted

## 2020-06-06 ENCOUNTER — Encounter: Payer: Self-pay | Admitting: Gastroenterology

## 2020-06-06 ENCOUNTER — Ambulatory Visit (INDEPENDENT_AMBULATORY_CARE_PROVIDER_SITE_OTHER): Payer: Self-pay | Admitting: Gastroenterology

## 2020-06-06 VITALS — BP 100/62 | HR 72 | Ht 73.0 in | Wt 252.0 lb

## 2020-06-06 DIAGNOSIS — Z7901 Long term (current) use of anticoagulants: Secondary | ICD-10-CM

## 2020-06-06 DIAGNOSIS — Z86010 Personal history of colonic polyps: Secondary | ICD-10-CM

## 2020-06-06 NOTE — Progress Notes (Signed)
06/06/2020 Kyle Burke [id number] 02/24/1946   HISTORY OF PRESENT ILLNESS: This is a 74 year old male who is a patient of Dr. Vena Rua.  He had a colonoscopy in September 2017 at which time he had 3 polyps removed, a 5 mm polyp, and two 2 to 3 mm polyps.  They were tubular adenomas.  He also had diverticulosis and internal hemorrhoids.  Repeat colonoscopy was recommended a 3-year interval.  He is on Xarelto for paroxysmal atrial fibrillation.  He has a low ejection fraction of 20 to 25%.  Follows with cardiology at the heart failure clinic.  He had a pacemaker put in in November 2019 and follows with Dr. Lovena Le for that.  Otherwise no new issues.  These issues were present at the time of his last colonoscopy.  He denies any GI issues including rectal bleeding, change in bowel habits, abdominal pain.  I saw him last year, October 2020, at which time we planned for colonoscopy, but Dr. Haroldine Laws wanted to wait until he was seen in the clinic in January.  After he was seen at that visit Dr. Haroldine Laws did write in his note that he was cleared for colonoscopy with low perioperative cardiovascular risk and had cleared him to hold Xarelto for 48 hours prior.  Nonetheless, this never got relayed to [place] so he is now back here at this point to schedule colonoscopy.  He is primarily Spanish-speaking so the entire visit was performed via interpreter.   Past Medical History:  Diagnosis Date  . Arthritis    "knees" (07/13/2018)  . Atrial fibrillation ([place]) 2016  . CAD (coronary artery disease)   . High cholesterol   . Hypertension 2006  . Idiopathic cardiomyopathy ([place]) 11/01/2014   Followed by Dr. Acie Fredrickson, Cardiology  EF 25%  Adenosine Cardiolite did not support ischemic etiology  . OA (osteoarthritis) of knee 01/08/2017  . Pleural effusion   . Presence of permanent cardiac pacemaker 07/13/2018  . Refusal of blood transfusions as patient is Jehovah's Witness   . Type II diabetes mellitus  ([place]) 2001   Past Surgical History:  Procedure Laterality Date  . BI-VENTRICULAR PACEMAKER INSERTION (CRT-P)  07/13/2018  . BIV PACEMAKER INSERTION CRT-P N/A 07/13/2018   Procedure: BIV PACEMAKER INSERTION CRT-P;  Surgeon: Evans Lance, MD;  Location: [facility];  Service: Cardiovascular;  Laterality: N/A;  . COLONOSCOPY WITH PROPOFOL N/A 05/14/2016   Procedure: COLONOSCOPY WITH PROPOFOL;  Surgeon: Jerene Bears, MD;  [facility];  Service: Gastroenterology;  Laterality: N/A;  . ESOPHAGOGASTRODUODENOSCOPY (EGD) WITH PROPOFOL N/A 05/14/2016   Procedure: ESOPHAGOGASTRODUODENOSCOPY (EGD) WITH PROPOFOL;  Surgeon: Jerene Bears, MD;  [facility];  Service: Gastroenterology;  Laterality: N/A;  . INCISE AND DRAIN ABCESS Left 1986   on leg  . INGUINAL HERNIA REPAIR  01/16/2012   Procedure: LAPAROSCOPIC INGUINAL HERNIA;  Surgeon: Gayland Curry, MD,FACS;  Location: [facility];  Service: General;  Laterality: Left;  Laparoscopic incarcerated Left inguinal hernia repair with mesh, umbilical hernia repair  . UMBILICAL HERNIA REPAIR  01/16/2012   Procedure: HERNIA REPAIR UMBILICAL ADULT;  Surgeon: Gayland Curry, MD,FACS;  Location: [facility];  Service: General;  Laterality: N/A;    reports that he has never smoked. He has never used smokeless tobacco. He reports previous alcohol use. He reports that he does not use drugs. family history includes Cancer (age of onset: 21) in his sister; Depression in his sister; Diabetes in his brother and father; Heart disease  in his father and mother; Kidney disease in his father; Stroke in his mother; Varicose Veins in his son. Allergies  Allergen Reactions  . Spironolactone Other (See Comments)    Gynecomastia      Outpatient Encounter Medications as of 06/06/2020  Medication Sig  . acetaminophen (TYLENOL) 650 MG CR tablet Take 650 mg by mouth every 8 (eight) hours as needed for pain. Reported on 02/14/2016  . AgaMatrix Ultra-Thin Lancets MISC  Check blood glucose twice daily before meals  . atorvastatin (LIPITOR) 80 MG tablet TAKE 1 TABLET (80MG  TOTAL) BY MOUTH ONCE A DAY  . Blood Glucose Monitoring Suppl (AGAMATRIX PRESTO PRO METER) DEVI Twice daily blood glucose checks before meals  . carvedilol (COREG) 6.25 MG tablet TAKE 1 AND 1/2 TABLETS (9.375MG ) BY MOUTH 2 TIMES A DAY WITH A MEAL  . digoxin (LANOXIN) 0.25 MG tablet TAKE 1/2 TABLET (0.125MG  TOTAL) BY MOUTH DAILY  . empagliflozin (JARDIANCE) 10 MG TABS tablet Take 10 mg by mouth daily before breakfast.  . ferrous gluconate (FERGON) 324 MG tablet Take 1 tablet (324 mg total) by mouth daily with breakfast.  . furosemide (LASIX) 40 MG tablet Take 1.5 tablets (60 mg total) by mouth every morning AND 1 tablet (40 mg total) every evening.  Marland Kitchen glucose blood (AGAMATRIX PRESTO TEST) test strip Twice daily glucose checks before meals  . Glycerin-Hypromellose-PEG 400 ([employer] DRY EYE RELIEF) 0.2-0.2-1 % SOLN Place 2 drops into both eyes as needed.  Marland Kitchen losartan (COZAAR) 50 MG tablet Take 1 tablet (50 mg total) by mouth daily.  . rivaroxaban (XARELTO) 20 MG TABS tablet TAKE 1 TABLET BY MOUTH ONCE DAILY WITH SUPPER  . sildenafil (VIAGRA) 100 MG tablet TAKE ONE-HALF TO ONE TABLET BY MOUTH ONE-HALF HOUR BEFORE ACTIVITY. ONLY ONE TABLET MAX IN 24 HOURS   No facility-administered encounter medications on file as of 06/06/2020.    REVIEW OF SYSTEMS  : All other systems reviewed and negative except where noted in the History of Present Illness.   PHYSICAL EXAM: BP 100/62   Pulse 72   Ht 6\' 1"  (1.854 m)   Wt 252 lb (114.3 kg)   BMI 33.25 kg/m  General: Well developed Hispanic male in no acute distress Head: Normocephalic and atraumatic Eyes:  Sclerae anicteric, conjunctiva pink. Ears: Normal auditory acuity Lungs: Clear throughout to auscultation; no W/R/R. Heart: Regular rate and rhythm; no M/R/G. Abdomen: Soft, non-distended.  BS present.  Non-tender. Rectal:  Will be done at the time of  colonoscopy. Musculoskeletal: Symmetrical with no gross deformities  Skin: No lesions on visible extremities Extremities: No edema  Neurological: Alert oriented x 4, grossly non-focal Psychological:  Alert and cooperative. Normal mood and affect  ASSESSMENT AND PLAN: *Personal history of colon polyps:  Tubular adenomas removed on colonoscopy in 04/2016 so repeat recommended in 3 years.  Will schedule with Dr. Hilarie Fredrickson. *Chronic anticoagulation for atrial fibrillation:  Will hold Xarelto for 2 days prior to endoscopic procedures - will instruct when and how to resume after procedure. Benefits and risks of procedure explained including risks of bleeding, perforation, infection, missed lesions, reactions to medications and possible need for hospitalization and surgery for complications. Additional rare but real risk of stroke or other vascular clotting events off of Xarelto also explained and need to seek urgent help if any signs of these problems occur. Will communicate by phone or EMR with patient's prescribing provider, Dr. Haroldine Laws, to confirm that holding Xarelto is reasonable in this case.  *Chronic systolic HF, NICM  with EF of 20-25%:  Will need colonoscopy to be done in the hospital setting.     CC:  Mack Hook, MD

## 2020-06-06 NOTE — Telephone Encounter (Signed)
Patient with diagnosis of afib on Xarelto for anticoagulation.    Procedure: colonoscopy Date of procedure: 07/09/20 [id number]}  CHA2DS2-VASc Score = 5  This indicates a 7.2% annual risk of stroke. The patient's score is based upon: CHF History: 1 HTN History: 1 Diabetes History: 1 Stroke History: 0 Vascular Disease History: 1 Age Score: 1 Gender Score: 0   CrCl 47mL/min using adjusted body weight Platelet count 178K  Per office protocol, patient can hold Xarelto for 2 days prior to procedure as requested.

## 2020-06-06 NOTE — Telephone Encounter (Signed)
[facility] Pre-operative Risk Assessment     Request for surgical clearance:     Endoscopy Procedure  What type of surgery is being performed?     Colonoscopy  When is this surgery scheduled?     Monday 07/09/20  What type of clearance is required ?   Pharmacy  Are there any medications that need to be held prior to surgery and how long? Xarelto 2 days  Practice name and name of physician performing surgery?      [facility]  What is your office phone and fax number?      Phone- [phone]  [phone]  Anesthesia type (None, local, MAC, general) ?       MAC

## 2020-06-06 NOTE — Patient Instructions (Signed)
If you are age 73 or older, your body mass index should be between 23-30. Your Body mass index is 33.25 kg/m. If this is out of the aforementioned range listed, please consider follow up with your Primary Care Provider.  If you are age 53 or younger, your body mass index should be between 19-25. Your Body mass index is 33.25 kg/m. If this is out of the aformentioned range listed, please consider follow up with your Primary Care Provider.   You have been scheduled for a colonoscopy. Please follow written instructions given to you at your visit today.  Please pick up your prep supplies at the pharmacy within the next 1-3 days. If you use inhalers (even only as needed), please bring them with you on the day of your procedure.  Due to recent changes in healthcare laws, you may see the results of your imaging and laboratory studies on MyChart before your provider has had a chance to review them.  We understand that in some cases there may be results that are confusing or concerning to you. Not all laboratory results come back in the same time frame and the provider may be waiting for multiple results in order to interpret others.  Please give [place] 48 hours in order for your provider to thoroughly review all the results before contacting the office for clarification of your results.

## 2020-06-06 NOTE — Telephone Encounter (Signed)
   Primary Cardiologist: Mertie Moores, MD  Chart reviewed as part of pre-operative protocol coverage. We have been asked for guidance to hold xarelto, not medical clearance.   Per our clinical pharmacist: Patient with diagnosis of afib on Xarelto for anticoagulation.    Procedure: colonoscopy Date of procedure: 07/09/20 [id number]}  CHA2DS2-VASc Score = 5  This indicates a 7.2% annual risk of stroke. The patient's score is based upon: CHF History: 1 HTN History: 1 Diabetes History: 1 Stroke History: 0 Vascular Disease History: 1 Age Score: 1 Gender Score: 0   CrCl 73mL/min using adjusted body weight Platelet count 178K  Per office protocol, patient can hold Xarelto for 2 days prior to procedure as requested   I will route this recommendation to the requesting party via [facility] fax function and remove from pre-op pool. Please call with questions.  Tami Lin Robertha Staples, PA 06/06/2020, 4:32 PM

## 2020-06-12 NOTE — Progress Notes (Signed)
Addendum: Reviewed and agree with assessment and management plan. Ladesha Pacini M, MD  

## 2020-06-20 NOTE — Telephone Encounter (Signed)
Kyle Burke interrupted to the patient for him to hold Xarelto 2 days prior to procedure. Patient voiced understanding.

## 2020-06-28 ENCOUNTER — Other Ambulatory Visit: Payer: Self-pay

## 2020-07-05 ENCOUNTER — Other Ambulatory Visit (HOSPITAL_COMMUNITY)
Admission: RE | Admit: 2020-07-05 | Discharge: 2020-07-05 | Disposition: A | Discharge status: Home / Self Care | Payer: HRSA Program | Source: Ambulatory Visit | Attending: Internal Medicine | Admitting: Internal Medicine

## 2020-07-05 DIAGNOSIS — Z20822 Contact with and (suspected) exposure to covid-19: Secondary | ICD-10-CM | POA: Insufficient documentation

## 2020-07-05 DIAGNOSIS — Z01812 Encounter for preprocedural laboratory examination: Secondary | ICD-10-CM | POA: Insufficient documentation

## 2020-07-06 LAB — SARS CORONAVIRUS 2 (TAT 6-24 HRS): SARS Coronavirus 2: NEGATIVE

## 2020-07-09 ENCOUNTER — Ambulatory Visit (HOSPITAL_COMMUNITY)
Admission: RE | Admit: 2020-07-09 | Discharge: 2020-07-09 | Disposition: A | Discharge status: Home / Self Care | Payer: Self-pay | Attending: Internal Medicine | Admitting: Internal Medicine

## 2020-07-09 ENCOUNTER — Encounter (HOSPITAL_COMMUNITY)
Admission: RE | Disposition: A | Discharge status: Home / Self Care | Payer: Self-pay | Source: Home / Self Care | Attending: Internal Medicine

## 2020-07-09 ENCOUNTER — Ambulatory Visit (HOSPITAL_COMMUNITY): Payer: Self-pay | Admitting: Registered Nurse

## 2020-07-09 ENCOUNTER — Other Ambulatory Visit: Payer: Self-pay

## 2020-07-09 ENCOUNTER — Encounter (HOSPITAL_COMMUNITY): Payer: Self-pay | Admitting: Internal Medicine

## 2020-07-09 DIAGNOSIS — Z09 Encounter for follow-up examination after completed treatment for conditions other than malignant neoplasm: Secondary | ICD-10-CM | POA: Insufficient documentation

## 2020-07-09 DIAGNOSIS — K56609 Unspecified intestinal obstruction, unspecified as to partial versus complete obstruction: Secondary | ICD-10-CM | POA: Insufficient documentation

## 2020-07-09 DIAGNOSIS — Z950 Presence of cardiac pacemaker: Secondary | ICD-10-CM | POA: Insufficient documentation

## 2020-07-09 DIAGNOSIS — K5730 Diverticulosis of large intestine without perforation or abscess without bleeding: Secondary | ICD-10-CM | POA: Insufficient documentation

## 2020-07-09 DIAGNOSIS — K648 Other hemorrhoids: Secondary | ICD-10-CM | POA: Insufficient documentation

## 2020-07-09 DIAGNOSIS — Z86010 Personal history of colonic polyps: Secondary | ICD-10-CM | POA: Insufficient documentation

## 2020-07-09 DIAGNOSIS — Z888 Allergy status to other drugs, medicaments and biological substances status: Secondary | ICD-10-CM | POA: Insufficient documentation

## 2020-07-09 DIAGNOSIS — K621 Rectal polyp: Secondary | ICD-10-CM | POA: Insufficient documentation

## 2020-07-09 DIAGNOSIS — D128 Benign neoplasm of rectum: Secondary | ICD-10-CM

## 2020-07-09 DIAGNOSIS — K4090 Unilateral inguinal hernia, without obstruction or gangrene, not specified as recurrent: Secondary | ICD-10-CM | POA: Insufficient documentation

## 2020-07-09 HISTORY — PX: HEMOSTASIS CLIP PLACEMENT: SHX6857

## 2020-07-09 HISTORY — PX: COLONOSCOPY WITH PROPOFOL: SHX5780

## 2020-07-09 HISTORY — PX: POLYPECTOMY: SHX5525

## 2020-07-09 LAB — GLUCOSE, CAPILLARY: Glucose-Capillary: 95 mg/dL (ref 70–99)

## 2020-07-09 SURGERY — COLONOSCOPY WITH PROPOFOL
Anesthesia: Monitor Anesthesia Care

## 2020-07-09 MED ORDER — LACTATED RINGERS IV SOLN
INTRAVENOUS | Status: AC | PRN
  Administered 2020-07-09: 1000 mL via INTRAVENOUS

## 2020-07-09 MED ORDER — PHENYLEPHRINE HCL-NACL 20-0.9 MG/250ML-% IV SOLN
INTRAVENOUS | Status: DC | PRN
  Administered 2020-07-09: 25 ug/min via INTRAVENOUS

## 2020-07-09 MED ORDER — PROPOFOL 500 MG/50ML IV EMUL
INTRAVENOUS | Status: DC | PRN
  Administered 2020-07-09: 140 ug/kg/min via INTRAVENOUS

## 2020-07-09 MED ORDER — PROPOFOL 10 MG/ML IV BOLUS
INTRAVENOUS | Status: DC | PRN
  Administered 2020-07-09: 20 mg via INTRAVENOUS

## 2020-07-09 SURGICAL SUPPLY — 22 items

## 2020-07-09 NOTE — Op Note (Signed)
[facility] Patient Name: Kyle Burke Procedure Date: 07/09/2020 MRN: [id number] Attending MD: Jerene Bears , MD Date of Birth: 05/22/1946 CSN: [id number] Age: 74 Admit Type: Outpatient Procedure:                Colonoscopy Indications:              High risk colon cancer surveillance: Personal                            history of multiple (3) adenomas, Last colonoscopy:                            September 2017 Providers:                Lajuan Lines. Hilarie Fredrickson, MD, Doristine Johns, RN, Glori Bickers, RN, Tyrone Apple, Technician, Courtney Heys                            [place], CRNA Referring MD:             Mack Hook, MD Medicines:                Monitored Anesthesia Care Complications:            No immediate complications. Estimated Blood Loss:     Estimated blood loss: none. Procedure:                Pre-Anesthesia Assessment:                           - Prior to the procedure, a History and Physical                            was performed, and patient medications and                            allergies were reviewed. The patient's tolerance of                            previous anesthesia was also reviewed. The risks                            and benefits of the procedure and the sedation                            options and risks were discussed with the patient.                            All questions were answered, and informed consent                            was obtained. Prior Anticoagulants: The patient has  taken Xarelto (rivaroxaban), last dose was 2 days                            prior to procedure. ASA Grade Assessment: III - A                            patient with severe systemic disease. After                            reviewing the risks and benefits, the patient was                            deemed in satisfactory condition to undergo the                             procedure.                           After obtaining informed consent, the colonoscope                            was passed under direct vision. Throughout the                            procedure, the patient's blood pressure, pulse, and                            oxygen saturations were monitored continuously. The                            PCF-H190DL ([id number]) Olympus pediatric colonoscope                            was introduced through the anus and advanced to the                            descending colon to examine a stenosis. This was                            the intended extent. The colonoscopy was performed                            without difficulty. The patient tolerated the                            procedure well. The quality of the bowel                            preparation was good. The rectum was photographed. Scope In: 11:35:13 AM Scope Out: 11:46:55 AM Total Procedure Duration: 0 hours 11 minutes 42 seconds  Findings:      The digital rectal exam was normal.      An extrinsic stenosis was found in the proximal descending colon and was       non-traversed.  The bowel lumen is acutely compressed preventing passage       of the pediatric colonoscope. This is likely secondary to large left       inguinal hernia. Given presence of the large hernia the decision was       made not to attempt further progress towards the cecum.      Multiple small and large-mouthed diverticula were found in the sigmoid       colon and descending colon.      A 10 mm polyp was found in the rectum. The polyp was pedunculated with       blood clot at the tip. The polyp was removed with a hot snare. Resection       and retrieval were complete. To prevent bleeding after the polypectomy,       one hemostatic clip was successfully placed. There was no bleeding       during, or at the end, of the procedure.      Internal hemorrhoids were found during retroflexion. The hemorrhoids       were  small. Impression:               - Stenosis in the proximal descending colon likely                            due to large left inguinal hernia containing bowel.                            Colonoscopy attempt aborted in this location due                            this anatomy                           - Diverticulosis in the sigmoid colon and in the                            descending colon.                           - One 10 mm polyp in the rectum, removed with a hot                            snare. Resected and retrieved. Clip was placed                            prophylactically given need to resume Xarelto.                           - Small internal hemorrhoids. Moderate Sedation:      N/A Recommendation:           - Patient has a contact number available for                            emergencies. The signs and symptoms of potential                            delayed complications were  discussed with the                            patient. Return to normal activities tomorrow.                            Written discharge instructions were provided to the                            patient.                           - Resume previous diet.                           - Continue present medications.                           - Resume Xarelto (rivaroxaban) at prior dose today.                            Refer to managing physician for further adjustment                            of therapy.                           - Await pathology results.                           - No recommendation at this time regarding repeat                            colonoscopy. If inguinal hernia is repaired, then                            repeat colonoscopy should be considered. Surgical                            referral to at least discuss inguinal hernia is                            recommended. Procedure Code(s):        --- Professional ---                           [phone], 52, Colonoscopy,  flexible; with removal of                            tumor(s), polyp(s), or other lesion(s) by snare                            technique Diagnosis Code(s):        --- Professional ---                           Z86.010, Personal history of colonic  polyps                           K56.699, Other intestinal obstruction unspecified                            as to partial versus complete obstruction                           K64.8, Other hemorrhoids                           K62.1, Rectal polyp                           K57.30, Diverticulosis of large intestine without                            perforation or abscess without bleeding CPT copyright 2019 American Medical Association. All rights reserved. The codes documented in this report are preliminary and upon coder review may  be revised to meet current compliance requirements. Jerene Bears, MD 07/09/2020 12:00:41 PM This report has been signed electronically. Number of Addenda: 0

## 2020-07-09 NOTE — H&P (Signed)
HPI: Kyle Burke is a 74 year old male with a history of adenomatous colon polyps, diverticulosis, internal hemorrhoids, left inguinal hernia, A. fib with congestive heart failure, ejection fraction 20 to 25%, on Xarelto who presents for surveillance colonoscopy.  Seen recently in the clinic by Alonza Bogus, PA-C  No new issues or complaints.  Tolerated the prep well.  Is seen with medical Spanish interpreter  Left inguinal hernia not tender or bothersome to him.  He has been off Xarelto x48 hours  Past Medical History:  Diagnosis Date  . Arthritis    "knees" (07/13/2018)  . Atrial fibrillation ([place]) 2016  . CAD (coronary artery disease)   . High cholesterol   . Hypertension 2006  . 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 Acie Fredrickson, Cardiology  EF 25%  Adenosine Cardiolite did not support ischemic etiology  . OA (osteoarthritis) of knee 01/08/2017  . Pleural effusion   . Presence of permanent cardiac pacemaker 07/13/2018  . Refusal of blood transfusions as patient is Jehovah's Witness   . Type II diabetes mellitus ([place]) 2001    Past Surgical History:  Procedure Laterality Date  . BI-VENTRICULAR PACEMAKER INSERTION (CRT-P)  07/13/2018  . BIV PACEMAKER INSERTION CRT-P N/A 07/13/2018   Procedure: BIV PACEMAKER INSERTION CRT-P;  Surgeon: Evans Lance, MD;  Location: [place] [facility];  Service: Cardiovascular;  Laterality: N/A;  . COLONOSCOPY WITH PROPOFOL N/A 05/14/2016   Procedure: COLONOSCOPY WITH PROPOFOL;  Surgeon: Jerene Bears, MD;  Location: [facility];  Service: Gastroenterology;  Laterality: N/A;  . ESOPHAGOGASTRODUODENOSCOPY (EGD) WITH PROPOFOL N/A 05/14/2016   Procedure: ESOPHAGOGASTRODUODENOSCOPY (EGD) WITH PROPOFOL;  Surgeon: Jerene Bears, MD;  Location: [facility];  Service: Gastroenterology;  Laterality: N/A;  . INCISE AND DRAIN ABCESS Left 1986   on leg  . INGUINAL HERNIA REPAIR  01/16/2012   Procedure: LAPAROSCOPIC INGUINAL  HERNIA;  Surgeon: Gayland Curry, MD,FACS;  Location: [facility];  Service: General;  Laterality: Left;  Laparoscopic incarcerated Left inguinal hernia repair with mesh, umbilical hernia repair  . UMBILICAL HERNIA REPAIR  01/16/2012   Procedure: HERNIA REPAIR UMBILICAL ADULT;  Surgeon: Gayland Curry, MD,FACS;  Location: [facility];  Service: General;  Laterality: N/A;    (Not in an outpatient encounter)   Allergies  Allergen Reactions  . Spironolactone Other (See Comments)    Gynecomastia    Family History  Problem Relation Age of Onset  . Stroke Mother   . Heart disease Mother   . Heart disease Father   . Diabetes Father   . Kidney disease Father        , history of stones as well  . Diabetes Brother   . Cancer Sister 84       unknown soft tissue cancer of leg  . Depression Sister   . Varicose Veins Son     Social History   Tobacco Use  . Smoking status: Never Smoker  . Smokeless tobacco: Never Used  Vaping Use  . Vaping Use: Never used  Substance Use Topics  . Alcohol use: Not Currently    Alcohol/week: 0.0 standard drinks    Comment: 07/13/2018 "used to drink; none since 2015"  . Drug use: Never    ROS: As per history of present illness, otherwise negative  BP (!) 142/85   Pulse 74   Temp 98.8 F (37.1 C) (Oral)   Resp 19   Ht 6\' 3"  (1.905 m)   Wt 116.1 kg   SpO2 100%  BMI 31.99 kg/m  Gen: awake, alert, NAD HEENT: anicteric, op clear CV: RRR Pulm: CTA b/l Abd: soft, nontender, nondistended, large left inguinal hernia which is soft and nontender, unable to be reduced, nondistended abdomen, +BS throughout Ext: no c/c/e Neuro: nonfocal  RELEVANT LABS AND IMAGING: CBC    Component Value Date/Time   WBC 5.8 12/29/2019 0945   WBC 5.6 09/12/2019 1459   RBC 4.25 12/29/2019 0945   RBC 3.99 (L) 09/12/2019 1459   HGB 13.4 12/29/2019 0945   HGB 12.5 (L) 06/17/2017 1201   HCT 40.3 12/29/2019 0945   HCT 38.7 06/17/2017 1201   PLT 178 12/29/2019 0945   MCV 95  12/29/2019 0945   MCV 91.9 06/17/2017 1201   MCH 31.5 12/29/2019 0945   MCH 31.3 09/12/2019 1459   MCHC 33.3 12/29/2019 0945   MCHC 32.6 09/12/2019 1459   RDW 13.1 12/29/2019 0945   RDW 16.7 (H) 06/17/2017 1201   LYMPHSABS 1.4 12/29/2019 0945   LYMPHSABS 1.0 06/17/2017 1201   MONOABS 0.4 06/17/2017 1201   EOSABS 0.2 12/29/2019 0945   BASOSABS 0.0 12/29/2019 0945   BASOSABS 0.0 06/17/2017 1201    CMP     Component Value Date/Time   NA 141 12/29/2019 0945   NA 140 06/17/2017 1201   K 4.8 12/29/2019 0945   K 3.7 06/17/2017 1201   CL 100 12/29/2019 0945   CO2 28 12/29/2019 0945   CO2 29 06/17/2017 1201   GLUCOSE 124 (H) 12/29/2019 0945   GLUCOSE 181 (H) 09/12/2019 1459   GLUCOSE 142 (H) 06/17/2017 1201   BUN 19 12/29/2019 0945   BUN 26.5 (H) 06/17/2017 1201   CREATININE 1.18 12/29/2019 0945   CREATININE 1.3 06/17/2017 1201   CALCIUM 9.6 12/29/2019 0945   CALCIUM 9.4 06/17/2017 1201   PROT 7.7 12/29/2019 0945   PROT 7.4 06/17/2017 1201   ALBUMIN 4.7 12/29/2019 0945   ALBUMIN 3.9 06/17/2017 1201   AST 17 12/29/2019 0945   AST 27 06/17/2017 1201   ALT 16 12/29/2019 0945   ALT 21 06/17/2017 1201   ALKPHOS 109 12/29/2019 0945   ALKPHOS 172 (H) 06/17/2017 1201   BILITOT 1.2 12/29/2019 0945   BILITOT 2.35 (H) 06/17/2017 1201   GFRNONAA 61 12/29/2019 0945   GFRAA 71 12/29/2019 0945    ASSESSMENT/PLAN: 74 year old male with a history of adenomatous colon polyps, diverticulosis, internal hemorrhoids, left inguinal hernia, A. fib with congestive heart failure, ejection fraction 20 to 25%, on Xarelto who presents for surveillance colonoscopy.  1.  History of adenomatous colon polyps --surveillance colonoscopy today with monitored anesthesia care in the outpatient hospital setting.  Xarelto has been held x48 hours.  2.  Inguinal hernia --large left inguinal hernia asymptomatic.  I told him that this hernia may interfere with ability to complete colonoscopy should the hernia sac  containing colon.  It also carries a slightly high risk of complication with colonoscopy which he understands.  The nature of the procedure, as well as the risks, benefits, and alternatives were carefully and thoroughly reviewed with the patient. Ample time for discussion and questions allowed. The patient understood, was satisfied, and agreed to proceed.

## 2020-07-09 NOTE — Transfer of Care (Signed)
Immediate Anesthesia Transfer of Care Note  Patient: Kyle Burke  Procedure(s) Performed: COLONOSCOPY WITH PROPOFOL (N/A ) POLYPECTOMY HEMOSTASIS CLIP PLACEMENT  Patient Location: PACU  Anesthesia Type:MAC  Level of Consciousness: sedated  Airway & Oxygen Therapy: Patient Spontanous Breathing and Patient connected to face mask oxygen  Post-op Assessment: Report given to RN and Post -op Vital signs reviewed and stable  Post vital signs: Reviewed and stable  Last Vitals:  Vitals Value Taken Time  BP 116/46 07/09/20 1152  Temp    Pulse 70 07/09/20 1153  Resp 17 07/09/20 1153  SpO2 98 % 07/09/20 1153  Vitals shown include unvalidated device data.  Last Pain:  Vitals:   07/09/20 1043  TempSrc: Oral  PainSc: 3          Complications: No complications documented.

## 2020-07-09 NOTE — Anesthesia Preprocedure Evaluation (Signed)
Anesthesia Evaluation  Patient identified by MRN, date of birth, ID band Patient awake    Reviewed: Allergy & Precautions, NPO status , Patient's Chart, lab work & pertinent test results  Airway Mallampati: I  TM Distance: >3 FB Neck ROM: Full    Dental   Pulmonary    Pulmonary exam normal        Cardiovascular hypertension, + CAD  Normal cardiovascular exam+ dysrhythmias Atrial Fibrillation + pacemaker      Neuro/Psych    GI/Hepatic   Endo/Other  diabetes, Type 2, Oral Hypoglycemic Agents  Renal/GU      Musculoskeletal   Abdominal   Peds  Hematology   Anesthesia Other Findings   Reproductive/Obstetrics                             Anesthesia Physical Anesthesia Plan  ASA: III  Anesthesia Plan: MAC   Post-op Pain Management:    Induction: Intravenous  PONV Risk Score and Plan:   Airway Management Planned: Nasal Cannula  Additional Equipment:   Intra-op Plan:   Post-operative Plan:   Informed Consent: I have reviewed the patients History and Physical, chart, labs and discussed the procedure including the risks, benefits and alternatives for the proposed anesthesia with the patient or authorized representative who has indicated his/her understanding and acceptance.       Plan Discussed with: CRNA and Surgeon  Anesthesia Plan Comments:         Anesthesia Quick Evaluation

## 2020-07-09 NOTE — Discharge Instructions (Signed)

## 2020-07-09 NOTE — Anesthesia Postprocedure Evaluation (Signed)
Anesthesia Post Note  Patient: Maurie Olesen  Procedure(s) Performed: COLONOSCOPY WITH PROPOFOL (N/A ) POLYPECTOMY HEMOSTASIS CLIP PLACEMENT     Patient location during evaluation: PACU Anesthesia Type: MAC Level of consciousness: awake and alert Pain management: pain level controlled Vital Signs Assessment: post-procedure vital signs reviewed and stable Respiratory status: spontaneous breathing, nonlabored ventilation, respiratory function stable and patient connected to nasal cannula oxygen Cardiovascular status: stable and blood pressure returned to baseline Postop Assessment: no apparent nausea or vomiting Anesthetic complications: no   No complications documented.  Last Vitals:  Vitals:   07/09/20 1043 07/09/20 1152  BP: (!) 142/85 (!) 116/46  Pulse: 74 74  Resp: 19 19  Temp: 37.1 C 36.6 C  SpO2: 100% 97%    Last Pain:  Vitals:   07/09/20 1152  TempSrc: Oral  PainSc:                  Mareta Chesnut DAVID

## 2020-07-10 ENCOUNTER — Encounter: Payer: Self-pay | Admitting: Internal Medicine

## 2020-07-10 LAB — SURGICAL PATHOLOGY

## 2020-07-11 ENCOUNTER — Ambulatory Visit (INDEPENDENT_AMBULATORY_CARE_PROVIDER_SITE_OTHER): Payer: Self-pay

## 2020-07-11 ENCOUNTER — Encounter (HOSPITAL_COMMUNITY): Payer: Self-pay | Admitting: Internal Medicine

## 2020-07-11 DIAGNOSIS — I5022 Chronic systolic (congestive) heart failure: Secondary | ICD-10-CM

## 2020-07-11 LAB — CUP PACEART REMOTE DEVICE CHECK
Battery Remaining Longevity: 79 mo
Battery Remaining Percentage: 95.5 %
Battery Voltage: 2.98 V
Brady Statistic AP VP Percent: 96 %
Brady Statistic AP VS Percent: 0 %
Brady Statistic AS VP Percent: 1 %
Brady Statistic AS VS Percent: 1 %
Brady Statistic RA Percent Paced: 93 %
Date Time Interrogation Session: 20211124020012
Implantable Lead Implant Date: 20191126
Implantable Lead Implant Date: 20191126
Implantable Lead Implant Date: 20191126
Implantable Lead Location: 753858
Implantable Lead Location: 753860
Implantable Lead Location: 753860
Implantable Lead Model: 3830
Implantable Pulse Generator Implant Date: 20191126
Lead Channel Impedance Value: 540 Ohm
Lead Channel Impedance Value: 540 Ohm
Lead Channel Impedance Value: 660 Ohm
Lead Channel Pacing Threshold Amplitude: 0.5 V
Lead Channel Pacing Threshold Amplitude: 0.75 V
Lead Channel Pacing Threshold Amplitude: 1 V
Lead Channel Pacing Threshold Pulse Width: 0.5 ms
Lead Channel Pacing Threshold Pulse Width: 0.5 ms
Lead Channel Pacing Threshold Pulse Width: 1 ms
Lead Channel Sensing Intrinsic Amplitude: 1.4 mV
Lead Channel Sensing Intrinsic Amplitude: 9.7 mV
Lead Channel Setting Pacing Amplitude: 2 V
Lead Channel Setting Pacing Amplitude: 2 V
Lead Channel Setting Pacing Amplitude: 2.5 V
Lead Channel Setting Pacing Pulse Width: 0.5 ms
Lead Channel Setting Pacing Pulse Width: 0.5 ms
Lead Channel Setting Sensing Sensitivity: 0.7 mV
Pulse Gen Model: 3562
Pulse Gen Serial Number: 9060242

## 2020-07-19 NOTE — Progress Notes (Signed)
Remote pacemaker transmission.   

## 2020-07-23 ENCOUNTER — Other Ambulatory Visit (HOSPITAL_COMMUNITY): Payer: Self-pay | Admitting: Adult Health

## 2020-07-23 ENCOUNTER — Other Ambulatory Visit: Payer: Self-pay | Admitting: Internal Medicine

## 2020-07-23 ENCOUNTER — Telehealth: Payer: Self-pay | Admitting: Internal Medicine

## 2020-07-24 LAB — GLUCOSE, POCT (MANUAL RESULT ENTRY): POC Glucose: 161 mg/dl — AB (ref 70–99)

## 2020-07-24 NOTE — Telephone Encounter (Signed)
Patient notified

## 2020-08-03 ENCOUNTER — Other Ambulatory Visit: Payer: Self-pay | Admitting: Internal Medicine

## 2020-08-03 MED ORDER — LOSARTAN POTASSIUM 50 MG PO TABS
50.0000 mg | ORAL_TABLET | Freq: Every day | ORAL | 11 refills | Status: DC
Start: 2020-08-03 — End: 2021-06-27

## 2020-09-06 ENCOUNTER — Encounter: Payer: Self-pay | Admitting: Internal Medicine

## 2020-09-06 ENCOUNTER — Ambulatory Visit (INDEPENDENT_AMBULATORY_CARE_PROVIDER_SITE_OTHER): Payer: Self-pay | Admitting: Internal Medicine

## 2020-09-06 VITALS — BP 102/66 | HR 69 | Ht 75.0 in | Wt 246.0 lb

## 2020-09-06 DIAGNOSIS — I4821 Permanent atrial fibrillation: Secondary | ICD-10-CM

## 2020-09-06 DIAGNOSIS — I5022 Chronic systolic (congestive) heart failure: Secondary | ICD-10-CM

## 2020-09-06 DIAGNOSIS — I447 Left bundle-branch block, unspecified: Secondary | ICD-10-CM

## 2020-09-06 DIAGNOSIS — Z950 Presence of cardiac pacemaker: Secondary | ICD-10-CM

## 2020-09-06 NOTE — Progress Notes (Signed)
HPI [name] returns today for followup. He has a h/o chronic systolic heart failure, LBBB, chronic atrial fib, and is s/p Biv PPM insertion. He received a His bundle lead in the atrial port, an LV and RV leads and his EF has improved from 15% to 25%. He has class 2 symptoms. He has not had syncope and denies peripheral edema, chest pain or sob.  Allergies  Allergen Reactions  . Spironolactone Other (See Comments)    Gynecomastia     Current Outpatient Medications  Medication Sig Dispense Refill  . acetaminophen (TYLENOL) 650 MG CR tablet Take 650 mg by mouth every 8 (eight) hours as needed for pain.     . AgaMatrix Ultra-Thin Lancets MISC Check blood glucose twice daily before meals 100 each 11  . atorvastatin (LIPITOR) 80 MG tablet TAKE 1 TABLET (80MG  TOTAL) BY MOUTH ONCE A DAY 30 tablet 11  . Blood Glucose Monitoring Suppl (AGAMATRIX PRESTO PRO METER) DEVI Twice daily blood glucose checks before meals 1 Device 0  . carvedilol (COREG) 6.25 MG tablet TAKE 1 AND 1/2 TABLETS (9.375MG ) BY MOUTH 2 TIMES A DAY WITH A MEAL 90 tablet 11  . digoxin (LANOXIN) 0.25 MG tablet TAKE 1/2 TABLET (0.125MG  TOTAL) BY MOUTH DAILY 15 tablet 11  . empagliflozin (JARDIANCE) 10 MG TABS tablet Take 10 mg by mouth daily before breakfast. 30 tablet 11  . ferrous gluconate (FERGON) 324 MG tablet Take 1 tablet (324 mg total) by mouth daily with breakfast.  3  . furosemide (LASIX) 40 MG tablet TAKE 1.5 TABLETS BY MOUTH EVERY MORNING AND 1 TABLET EVERY EVENING 135 tablet 3  . glucose blood (AGAMATRIX PRESTO TEST) test strip Twice daily glucose checks before meals 100 each 12  . Glycerin-Hypromellose-PEG 400 0.2-0.2-1 % SOLN Place 2 drops into both eyes daily as needed (dry eyes).     Marland Kitchen losartan (COZAAR) 50 MG tablet Take 1 tablet (50 mg total) by mouth at bedtime. 30 tablet 11  . rivaroxaban (XARELTO) 20 MG TABS tablet TAKE 1 TABLET BY MOUTH ONCE DAILY WITH SUPPER 30 tablet 11  . sildenafil (VIAGRA) 100 MG  tablet TAKE ONE-HALF TO ONE TABLET BY MOUTH ONE-HALF HOUR BEFORE ACTIVITY. ONLY ONE TABLET MAX IN 24 HOURS 10 tablet 0   No current facility-administered medications for this visit.     Past Medical History:  Diagnosis Date  . Arthritis    "knees" (07/13/2018)  . Atrial fibrillation ([place]) 2016  . CAD (coronary artery disease)   . High cholesterol   . Hypertension 2006  . Idiopathic cardiomyopathy ([place]) 11/01/2014   Followed by Dr. Acie Fredrickson, Cardiology  EF 25%  Adenosine Cardiolite did not support ischemic etiology  . OA (osteoarthritis) of knee 01/08/2017  . Pleural effusion   . Presence of permanent cardiac pacemaker 07/13/2018  . Refusal of blood transfusions as patient is Jehovah's Witness   . Type II diabetes mellitus ([employer]) 2001    ROS:   All systems reviewed and negative except as noted in the HPI.   Past Surgical History:  Procedure Laterality Date  . BI-VENTRICULAR PACEMAKER INSERTION (CRT-P)  07/13/2018  . BIV PACEMAKER INSERTION CRT-P N/A 07/13/2018   Procedure: BIV PACEMAKER INSERTION CRT-P;  Surgeon: Evans Lance, MD;  Location: [facility];  Service: Cardiovascular;  Laterality: N/A;  . COLONOSCOPY WITH PROPOFOL N/A 05/14/2016   Procedure: COLONOSCOPY WITH PROPOFOL;  Surgeon: Jerene Bears, MD;  [facility];  Service: Gastroenterology;  Laterality:  N/A;  . COLONOSCOPY WITH PROPOFOL N/A 07/09/2020   Procedure: COLONOSCOPY WITH PROPOFOL;  Surgeon: Jerene Bears, MD;  Location: [facility];  Service: Gastroenterology;  Laterality: N/A;  . ESOPHAGOGASTRODUODENOSCOPY (EGD) WITH PROPOFOL N/A 05/14/2016   Procedure: ESOPHAGOGASTRODUODENOSCOPY (EGD) WITH PROPOFOL;  Surgeon: Jerene Bears, MD;  [facility];  Service: Gastroenterology;  Laterality: N/A;  . HEMOSTASIS CLIP PLACEMENT  07/09/2020   Procedure: HEMOSTASIS CLIP PLACEMENT;  Surgeon: Jerene Bears, MD;  Location[facility];  Service: Gastroenterology;;  . INCISE AND DRAIN ABCESS Left  1986   on leg  . INGUINAL HERNIA REPAIR  01/16/2012   Procedure: LAPAROSCOPIC INGUINAL HERNIA;  Surgeon: Gayland Curry, MD,FACS;  Location: [facility];  Service: General;  Laterality: Left;  Laparoscopic incarcerated Left inguinal hernia repair with mesh, umbilical hernia repair  . POLYPECTOMY  07/09/2020   Procedure: POLYPECTOMY;  Surgeon: Jerene Bears, MD;  Location: [facility];  Service: Gastroenterology;;  hot snare  . UMBILICAL HERNIA REPAIR  01/16/2012   Procedure: HERNIA REPAIR UMBILICAL ADULT;  Surgeon: Gayland Curry, MD,FACS;  Location: [facility];  Service: General;  Laterality: N/A;     Family History  Problem Relation Age of Onset  . Stroke Mother   . Heart disease Mother   . Heart disease Father   . Diabetes Father   . Kidney disease Father        , history of stones as well  . Diabetes Brother   . Cancer Sister 50       unknown soft tissue cancer of leg  . Depression Sister   . Varicose Veins Son      Social History   Socioeconomic History  . Marital status: Married    Spouse name: Verdis Frederickson  . Number of children: 5  . Years of education: 9  . Highest education level: Not on file  Occupational History  . Occupation: Concrete constrution-retired    Comment: Barista  Tobacco Use  . Smoking status: Never Smoker  . Smokeless tobacco: Never Used  Vaping Use  . Vaping Use: Never used  Substance and Sexual Activity  . Alcohol use: Not Currently    Alcohol/week: 0.0 standard drinks    Comment: 07/13/2018 "used to drink; none since 2015"  . Drug use: Never  . Sexual activity: Yes    Comment: Viagra helps a bit.  Other Topics Concern  . Not on file  Social History Narrative   Came to [place] In [place]   Lives at home with wife, a son and his wife and 3 grandchildren.   Social Determinants of Health   Financial Resource Strain: Low Risk   . Difficulty of Paying Living Expenses: Not hard at all  Food Insecurity: No Food Insecurity  . Worried About Sales executive in the Last Year: Never true  . Ran Out of Food in the Last Year: Never true  Transportation Needs: No Transportation Needs  . Lack of Transportation (Medical): No  . Lack of Transportation (Non-Medical): No  Physical Activity: Not on file  Stress: Not on file  Social Connections: Not on file  Intimate Partner Violence: Not At Risk  . Fear of Current or Ex-Partner: No  . Emotionally Abused: No  . Physically Abused: No  . Sexually Abused: No     BP 102/66   Pulse 69   Ht 6\' 3"  (1.905 m)   Wt 246 lb (111.6 kg)   SpO2 95%   BMI 30.75 kg/m  Physical Exam:  Well appearing NAD HEENT: Unremarkable Neck:  No JVD, no thyromegally Lymphatics:  No adenopathy Back:  No CVA tenderness Lungs:  Clear HEART:  Regular rate rhythm, no murmurs, no rubs, no clicks Abd:  soft, positive bowel sounds, no organomegally, no rebound, no guarding Ext:  2 plus pulses, no edema, no cyanosis, no clubbing Skin:  No rashes no nodules Neuro:  CN II through XII intact, motor grossly intact  EKG - atrial fib with ventricular pacing  DEVICE  Normal device function.  See PaceArt for details.   Assess/Plan: 1. Chronic atrial fib - he is well controlled after PPM insertion 2. PPM - his biv PPM is working normally.  3. Chronic systolic heart failure - his symptoms remain class 2. He will follow. 4. Dyslipidemia - he will continue lipitor. No change.  Carleene Overlie Munir [place],MD

## 2020-09-06 NOTE — Patient Instructions (Signed)
Medication Instructions:  Your physician recommends that you continue on your current medications as directed. Please refer to the Current Medication list given to you today.  Labwork: None ordered.  Testing/Procedures: None ordered.  Follow-Up: Your physician wants you to follow-up in: one year with Cristopher Peru, MD or one of the following Advanced Practice Providers on your designated Care Team:    Chanetta Marshall, NP  Tommye Standard, PA-C  Legrand Como "Jonni Sanger" [place], [place]  Remote monitoring is used to monitor your Pacemaker from home. This monitoring reduces the number of office visits required to check your device to one time per year. It allows [place] to keep an eye on the functioning of your device to ensure it is working properly. You are scheduled for a device check from home on 10/10/2020. You may send your transmission at any time that day. If you have a wireless device, the transmission will be sent automatically. After your physician reviews your transmission, you will receive a postcard with your next transmission date.  Any Other Special Instructions Will Be Listed Below (If Applicable).  If you need a refill on your cardiac medications before your next appointment, please call your pharmacy.

## 2020-09-07 ENCOUNTER — Other Ambulatory Visit: Payer: Self-pay | Admitting: Internal Medicine

## 2020-10-03 ENCOUNTER — Telehealth: Payer: Self-pay | Admitting: Internal Medicine

## 2020-10-03 NOTE — Telephone Encounter (Signed)
We went through this with him before--he needs to do this at [facility] MAP program.  I'm not sure why he keeps bringing the application to [place], but we tried to reeducate last go around with his refill.   If he is told to do something differently, to let us know.

## 2020-10-03 NOTE — Telephone Encounter (Signed)
Patietn stopped by the office to let request a refill on his Rx for rivaroxaban (XARELTO) 20 MG TABS tablet. Patient also dropped off the application for the Patient Assistance Program from [place] to get the assistance for the Rx he's requesting. Patient asked if we can call him whenever is signed so he can mailed it.

## 2020-10-03 NOTE — Telephone Encounter (Signed)
I spoke with patient and told him that he needs to apply for the MAP program at the [facility]. Patient stated that he will go the [facility] to start the application.

## 2020-10-10 ENCOUNTER — Ambulatory Visit (INDEPENDENT_AMBULATORY_CARE_PROVIDER_SITE_OTHER): Payer: Self-pay

## 2020-10-10 DIAGNOSIS — I5023 Acute on chronic systolic (congestive) heart failure: Secondary | ICD-10-CM

## 2020-10-11 LAB — CUP PACEART REMOTE DEVICE CHECK
Battery Remaining Longevity: 77 mo
Battery Remaining Percentage: 95.5 %
Battery Voltage: 2.98 V
Brady Statistic AP VP Percent: 97 %
Brady Statistic AP VS Percent: 0 %
Brady Statistic AS VP Percent: 1 %
Brady Statistic AS VS Percent: 1 %
Brady Statistic RA Percent Paced: 94 %
Date Time Interrogation Session: 20220223020013
Implantable Lead Implant Date: 20191126
Implantable Lead Implant Date: 20191126
Implantable Lead Implant Date: 20191126
Implantable Lead Location: 753858
Implantable Lead Location: 753860
Implantable Lead Location: 753860
Implantable Lead Model: 3830
Implantable Pulse Generator Implant Date: 20191126
Lead Channel Impedance Value: 460 Ohm
Lead Channel Impedance Value: 560 Ohm
Lead Channel Impedance Value: 660 Ohm
Lead Channel Pacing Threshold Amplitude: 0.5 V
Lead Channel Pacing Threshold Amplitude: 0.875 V
Lead Channel Pacing Threshold Amplitude: 1 V
Lead Channel Pacing Threshold Pulse Width: 0.5 ms
Lead Channel Pacing Threshold Pulse Width: 0.5 ms
Lead Channel Pacing Threshold Pulse Width: 1 ms
Lead Channel Sensing Intrinsic Amplitude: 1 mV
Lead Channel Sensing Intrinsic Amplitude: 10.2 mV
Lead Channel Setting Pacing Amplitude: 2 V
Lead Channel Setting Pacing Amplitude: 2 V
Lead Channel Setting Pacing Amplitude: 2.5 V
Lead Channel Setting Pacing Pulse Width: 0.5 ms
Lead Channel Setting Pacing Pulse Width: 0.5 ms
Lead Channel Setting Sensing Sensitivity: 0.7 mV
Pulse Gen Model: 3562
Pulse Gen Serial Number: 9060242

## 2020-10-18 ENCOUNTER — Telehealth: Payer: Self-pay | Admitting: Internal Medicine

## 2020-10-18 NOTE — Telephone Encounter (Signed)
Patient called requesting medication refills for rivaroxaban (XARELTO) 20 MG TABS tablet  Please send to [facility] on [address]

## 2020-10-19 MED ORDER — RIVAROXABAN 20 MG PO TABS
ORAL_TABLET | ORAL | 11 refills | Status: DC
Start: 2020-10-19 — End: 2021-06-20

## 2020-10-19 NOTE — Progress Notes (Signed)
Remote pacemaker transmission.   

## 2020-10-22 NOTE — Telephone Encounter (Signed)
Patient confirmed that he was able to pick up his prescription.  

## 2020-10-31 ENCOUNTER — Encounter: Payer: Self-pay | Admitting: Orthopaedic Surgery

## 2020-10-31 ENCOUNTER — Other Ambulatory Visit: Payer: Self-pay

## 2020-10-31 ENCOUNTER — Ambulatory Visit (INDEPENDENT_AMBULATORY_CARE_PROVIDER_SITE_OTHER): Payer: Self-pay | Admitting: Orthopaedic Surgery

## 2020-10-31 DIAGNOSIS — M170 Bilateral primary osteoarthritis of knee: Secondary | ICD-10-CM | POA: Insufficient documentation

## 2020-10-31 MED ORDER — LIDOCAINE HCL 1 % IJ SOLN
2.0000 mL | INTRAMUSCULAR | Status: AC | PRN
  Administered 2020-10-31: 2 mL

## 2020-10-31 MED ORDER — BUPIVACAINE HCL 0.25 % IJ SOLN
2.0000 mL | INTRAMUSCULAR | Status: AC | PRN
  Administered 2020-10-31: 2 mL via INTRA_ARTICULAR

## 2020-10-31 NOTE — Progress Notes (Signed)
Office Visit Note   Patient: Kyle Burke           Date of Birth: Jan 06, 1946           MRN: [id number] Visit Date: 10/31/2020              Requested by: Mack Hook, MD [place],   [id number] PCP: Mack Hook, MD   Assessment & Plan: Visit Diagnoses:  1. Bilateral primary osteoarthritis of knee     Plan: Bilateral knee osteoarthritis with good response to cortisone in the past.  Patient would like repeat cortisone injections.  Will repeat today and warned him about potential elevation of blood sugar  Follow-Up Instructions: Return if symptoms worsen or fail to improve.   Orders:  Orders Placed This Encounter  Procedures  . Large Joint Inj: bilateral knee   No orders of the defined types were placed in this encounter.     Procedures: Large Joint Inj: bilateral knee on 10/31/2020 10:25 AM Indications: diagnostic evaluation Details: 25 G 1.5 in needle, anteromedial approach  Arthrogram: No  Medications (Right): 2 mL lidocaine 1 %; 2 mL bupivacaine 0.25 % Medications (Left): 2 mL lidocaine 1 %; 2 mL bupivacaine 0.25 %  12 mg betamethasone injected both knees with Xylocaine and Marcaine Consent was given by the patient. Immediately prior to procedure a time out was called to verify the correct patient, procedure, equipment, support staff and site/side marked as required. Patient was prepped and draped in the usual sterile fashion.       Clinical Data: No additional findings.   Subjective: Chief Complaint  Patient presents with  . Left Knee - Pain  . Right Knee - Pain  Patient presents today for recurrent chronic bilateral knee pain. He was last here in August of last year and received bilateral knee injections. He said that they helped until early this year. He wants to get injections again today. Both hurt equally the same. He does take tylenol as needed.  No interest in knee replacement surgery  HPI  Review of  Systems   Objective: Vital Signs: Ht 6\' 3"  (1.905 m)   Wt 246 lb (111.6 kg)   BMI 30.75 kg/m   Physical Exam Constitutional:      Appearance: He is well-developed.  Eyes:     Pupils: Pupils are equal, round, and reactive to light.  Pulmonary:     Effort: Pulmonary effort is normal.  Skin:    General: Skin is warm and dry.  Neurological:     Mental Status: He is alert and oriented to person, place, and time.  Psychiatric:        Behavior: Behavior normal.     Ortho Exam accompanied by an interpreter.  Hypertrophic changes about both knees with predominantly medial joint pain.  Possibly very small effusions.  Full extension of flexed over 100 degrees without instability Specialty Comments:  No specialty comments available.  Imaging: No results found.   PMFS History: Patient Active Problem List   Diagnosis Date Noted  . Bilateral primary osteoarthritis of knee 10/31/2020  . Benign neoplasm of rectum   . Adenomatous polyp of colon 12/29/2019  . History of colonic polyps 06/10/2019  . Chronic anticoagulation 06/10/2019  . Pacemaker 07/13/2018  . OA (osteoarthritis) of knee 01/08/2017  . Mild CAD 08/12/2016  . Benign neoplasm of descending colon   . Benign neoplasm of transverse colon   . Heme + stool   . Gastritis and  gastroduodenitis   . Acute on chronic systolic CHF (congestive heart failure) ([place]) 02/09/2016  . Essential hypertension 06/15/2015  . Normocytic anemia 04/07/2015  . Controlled type 2 diabetes mellitus with stage 3 chronic kidney disease, without long-term current use of insulin ([place])   . Dyslipidemia   . Microalbuminuria due to type 2 diabetes mellitus ([place])   . Chronic systolic CHF (congestive heart failure) ([place]) 12/15/2014  . Atrial fibrillation ([place]) 09/06/2014  . HTN (hypertension) 09/06/2014  . LBBB (left bundle branch block) 09/06/2014   Past Medical History:  Diagnosis Date  . Arthritis    "knees" (07/13/2018)  . Atrial fibrillation  ([place]) 2016  . CAD (coronary artery disease)   . High cholesterol   . Hypertension 2006  . Idiopathic cardiomyopathy ([place]) 11/01/2014   Followed by Dr. Acie Fredrickson, Cardiology  EF 25%  Adenosine Cardiolite did not support ischemic etiology  . OA (osteoarthritis) of knee 01/08/2017  . Pleural effusion   . Presence of permanent cardiac pacemaker 07/13/2018  . Refusal of blood transfusions as patient is Jehovah's Witness   . Type II diabetes mellitus ([place]) 2001    Family History  Problem Relation Age of Onset  . Stroke Mother   . Heart disease Mother   . Heart disease Father   . Diabetes Father   . Kidney disease Father        , history of stones as well  . Diabetes Brother   . Cancer Sister 44       unknown soft tissue cancer of leg  . Depression Sister   . Varicose Veins Son     Past Surgical History:  Procedure Laterality Date  . BI-VENTRICULAR PACEMAKER INSERTION (CRT-P)  07/13/2018  . BIV PACEMAKER INSERTION CRT-P N/A 07/13/2018   Procedure: BIV PACEMAKER INSERTION CRT-P;  Surgeon: Evans Lance, MD;  Location: [facility];  Service: Cardiovascular;  Laterality: N/A;  . COLONOSCOPY WITH PROPOFOL N/A 05/14/2016   Procedure: COLONOSCOPY WITH PROPOFOL;  Surgeon: Jerene Bears, MD;  [facility];  Service: Gastroenterology;  Laterality: N/A;  . COLONOSCOPY WITH PROPOFOL N/A 07/09/2020   Procedure: COLONOSCOPY WITH PROPOFOL;  Surgeon: Jerene Bears, MD;  [facility];  Service: Gastroenterology;  Laterality: N/A;  . ESOPHAGOGASTRODUODENOSCOPY (EGD) WITH PROPOFOL N/A 05/14/2016   Procedure: ESOPHAGOGASTRODUODENOSCOPY (EGD) WITH PROPOFOL;  Surgeon: Jerene Bears, MD;  [facility];  Service: Gastroenterology;  Laterality: N/A;  . HEMOSTASIS CLIP PLACEMENT  07/09/2020   Procedure: HEMOSTASIS CLIP PLACEMENT;  Surgeon: Jerene Bears, MD;  [facility];  Service: Gastroenterology;;  . INCISE AND DRAIN ABCESS Left 1986   on leg  . INGUINAL HERNIA  REPAIR  01/16/2012   Procedure: LAPAROSCOPIC INGUINAL HERNIA;  Surgeon: Gayland Curry, MD,FACS;  [facility]: [facility];  Service: General;  Laterality: Left;  Laparoscopic incarcerated Left inguinal hernia repair with mesh, umbilical hernia repair  . POLYPECTOMY  07/09/2020   Procedure: POLYPECTOMY;  Surgeon: Jerene Bears, MD;  Location: [facility];  Service: Gastroenterology;;  hot snare  . UMBILICAL HERNIA REPAIR  01/16/2012   Procedure: HERNIA REPAIR UMBILICAL ADULT;  Surgeon: Gayland Curry, MD,FACS;  [facility];  Service: General;  Laterality: N/A;   Social History   Occupational History  . Occupation: Concrete constrution-retired    Comment: Barista  Tobacco Use  . Smoking status: Never Smoker  . Smokeless tobacco: Never Used  Vaping Use  . Vaping Use: Never used  Substance and Sexual Activity  .  Alcohol use: Not Currently    Alcohol/week: 0.0 standard drinks    Comment: 07/13/2018 "used to drink; none since 2015"  . Drug use: Never  . Sexual activity: Yes    Comment: Viagra helps a bit.

## 2020-11-01 ENCOUNTER — Telehealth: Payer: Self-pay | Admitting: Internal Medicine

## 2020-11-01 NOTE — Telephone Encounter (Signed)
Patient called asking for a refill of carvedilol (COREG) 6.25 MG tablet.  If you can please sent it to [facility].

## 2020-11-02 ENCOUNTER — Other Ambulatory Visit: Payer: Self-pay | Admitting: Internal Medicine

## 2020-11-08 NOTE — Telephone Encounter (Signed)
Please ask the patients to ask the pharmacy to ask for a refill.  REquest was sent from [facility] as well

## 2020-11-16 NOTE — Telephone Encounter (Signed)
Patient was informed of of Doctor recommendation and confirmed that he was able to pick up his Rx.

## 2020-12-20 ENCOUNTER — Ambulatory Visit: Payer: Self-pay | Admitting: Internal Medicine

## 2020-12-20 ENCOUNTER — Other Ambulatory Visit: Payer: Self-pay

## 2020-12-20 ENCOUNTER — Encounter: Payer: Self-pay | Admitting: Internal Medicine

## 2020-12-20 VITALS — BP 120/70 | HR 74 | Resp 12 | Ht 75.0 in | Wt 245.0 lb

## 2020-12-20 DIAGNOSIS — D126 Benign neoplasm of colon, unspecified: Secondary | ICD-10-CM

## 2020-12-20 DIAGNOSIS — E1122 Type 2 diabetes mellitus with diabetic chronic kidney disease: Secondary | ICD-10-CM

## 2020-12-20 DIAGNOSIS — E1129 Type 2 diabetes mellitus with other diabetic kidney complication: Secondary | ICD-10-CM

## 2020-12-20 DIAGNOSIS — I5023 Acute on chronic systolic (congestive) heart failure: Secondary | ICD-10-CM

## 2020-12-20 DIAGNOSIS — Z0000 Encounter for general adult medical examination without abnormal findings: Secondary | ICD-10-CM

## 2020-12-20 DIAGNOSIS — I10 Essential (primary) hypertension: Secondary | ICD-10-CM

## 2020-12-20 DIAGNOSIS — E785 Hyperlipidemia, unspecified: Secondary | ICD-10-CM

## 2020-12-20 DIAGNOSIS — R809 Proteinuria, unspecified: Secondary | ICD-10-CM

## 2020-12-20 DIAGNOSIS — N183 Chronic kidney disease, stage 3 unspecified: Secondary | ICD-10-CM

## 2020-12-20 DIAGNOSIS — D649 Anemia, unspecified: Secondary | ICD-10-CM

## 2020-12-20 DIAGNOSIS — K4090 Unilateral inguinal hernia, without obstruction or gangrene, not specified as recurrent: Secondary | ICD-10-CM

## 2020-12-20 MED ORDER — SILDENAFIL CITRATE 100 MG PO TABS
ORAL_TABLET | ORAL | 4 refills | Status: DC
Start: 2020-12-20 — End: 2023-03-26

## 2020-12-20 NOTE — Progress Notes (Signed)
Subjective:    Patient ID: Kyle Burke, male   DOB: [birth year], 75 y.o.   MRN: [id number]   HPI  Leeann Must interprets  Here for Male CPE:  1.  STE:  Performs monthly.  No findings.  2.  PSA: Checked 12/29/19 and normal.  No family history of prostate cancer.  3.  Guaiac Cards:  Last checked 2017 and negative.  4.  Colonoscopy:  Rectal polyp found 07/09/2020.  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  Could not traverse beyond proximal descending colon due to large left inguinal hernia causing extrinsic compression of colon.  History of adenomatous polyps in 2017 x 3.  He still has the hernia.  5.  Cholesterol/Glucose:  Cholesterol last checked 1 year ago and quite good.   Lipid Panel     Component Value Date/Time   CHOL 114 12/29/2019 0945   TRIG 100 12/29/2019 0945   HDL 40 12/29/2019 0945   CHOLHDL 4.0 09/15/2014 1037   VLDL 12 09/15/2014 1037   LDLCALC 55 12/29/2019 0945   LABVLDL 19 12/29/2019 0945   Diabetic control has been good with A1C of 6.8% last year this time.  6.  Immunizations:  Has received his 2 Moderna boosters, the last about 2 weeks ago.   Immunization History  Administered Date(s) Administered  . Influenza, High Dose Seasonal PF 10/26/2015  . Influenza,inj,Quad PF,6+ Mos 05/17/2017, 06/11/2018, 06/03/2019  . Influenza-Unspecified 05/16/2017, 06/02/2019, 04/09/2020  . Moderna Sars-Covid-2 Vaccination 10/10/2019, 11/08/2019  . Pneumococcal Conjugate-13 06/30/2019  . Pneumococcal Polysaccharide-23 08/21/2016  . Tdap 05/08/2016      Current Meds  Medication Sig  . acetaminophen (TYLENOL) 650 MG CR tablet Take 650 mg by mouth every 8 (eight) hours as needed for pain.   . AgaMatrix Ultra-Thin Lancets MISC Check blood glucose twice daily before meals  . atorvastatin (LIPITOR) 80 MG tablet TAKE 1 TABLET (80MG  TOTAL) BY MOUTH ONCE A DAY  . Blood Glucose Monitoring Suppl (AGAMATRIX PRESTO PRO METER) DEVI Twice daily blood glucose  checks before meals  . carvedilol (COREG) 6.25 MG tablet TAKE 1 AND 1/2 TABLETS (9.375MG ) BY MOUTH 2 TIMES A DAY WITH A MEAL  . digoxin (LANOXIN) 0.25 MG tablet TAKE 1/2 TABLET (0.125MG  TOTAL) BY MOUTH DAILY  . empagliflozin (JARDIANCE) 10 MG TABS tablet Take 10 mg by mouth daily before breakfast.  . ferrous gluconate (FERGON) 324 MG tablet Take 1 tablet (324 mg total) by mouth daily with breakfast.  . furosemide (LASIX) 40 MG tablet TAKE 1.5 TABLETS BY MOUTH EVERY MORNING AND 1 TABLET EVERY EVENING  . glucose blood (AGAMATRIX PRESTO TEST) test strip Twice daily glucose checks before meals  . Glycerin-Hypromellose-PEG 400 0.2-0.2-1 % SOLN Place 2 drops into both eyes daily as needed (dry eyes).   Marland Kitchen losartan (COZAAR) 50 MG tablet Take 1 tablet (50 mg total) by mouth at bedtime.  . rivaroxaban (XARELTO) 20 MG TABS tablet TAKE 1 TABLET BY MOUTH ONCE DAILY WITH SUPPER   Allergies  Allergen Reactions  . Spironolactone Other (See Comments)    Gynecomastia   Past Medical History:  Diagnosis Date  . Arthritis    "knees" (07/13/2018)  . Atrial fibrillation ([place]) 2016  . CAD (coronary artery disease)   . High cholesterol   . Hypertension 2006  . Idiopathic cardiomyopathy ([place]) 11/01/2014   Followed by Dr. Acie Fredrickson, Cardiology  EF 25%  Adenosine Cardiolite did not support ischemic etiology  . OA (osteoarthritis) of knee 01/08/2017  . Pleural  effusion   . Presence of permanent cardiac pacemaker 07/13/2018  . Refusal of blood transfusions as patient is Jehovah's Witness   . Type II diabetes mellitus ([place]) 2001    Past Surgical History:  Procedure Laterality Date  . BI-VENTRICULAR PACEMAKER INSERTION (CRT-P)  07/13/2018  . BIV PACEMAKER INSERTION CRT-P N/A 07/13/2018   Procedure: BIV PACEMAKER INSERTION CRT-P;  Surgeon: Evans Lance, MD;  Location: [facility];  Service: Cardiovascular;  Laterality: N/A;  . COLONOSCOPY WITH PROPOFOL N/A 05/14/2016   Procedure: COLONOSCOPY WITH PROPOFOL;   Surgeon: Jerene Bears, MD;  [facility];  Service: Gastroenterology;  Laterality: N/A;  . COLONOSCOPY WITH PROPOFOL N/A 07/09/2020   Procedure: COLONOSCOPY WITH PROPOFOL;  Surgeon: Jerene Bears, MD;  [facility];  Service: Gastroenterology;  Laterality: N/A;  . ESOPHAGOGASTRODUODENOSCOPY (EGD) WITH PROPOFOL N/A 05/14/2016   Procedure: ESOPHAGOGASTRODUODENOSCOPY (EGD) WITH PROPOFOL;  Surgeon: Jerene Bears, MD;  [facility];  Service: Gastroenterology;  Laterality: N/A;  . HEMOSTASIS CLIP PLACEMENT  07/09/2020   Procedure: HEMOSTASIS CLIP PLACEMENT;  Surgeon: Jerene Bears, MD;  [facility];  Service: Gastroenterology;;  . INCISE AND DRAIN ABCESS Left 1986   on leg  . INGUINAL HERNIA REPAIR  01/16/2012   Procedure: LAPAROSCOPIC INGUINAL HERNIA;  Surgeon: Gayland Curry, MD,FACS;  [facility];  Service: General;  Laterality: Left;  Laparoscopic incarcerated Left inguinal hernia repair with mesh, umbilical hernia repair  . POLYPECTOMY  07/09/2020   Procedure: POLYPECTOMY;  Surgeon: Jerene Bears, MD;  Location: [facility];  Service: Gastroenterology;;  hot snare  . UMBILICAL HERNIA REPAIR  01/16/2012   Procedure: HERNIA REPAIR UMBILICAL ADULT;  Surgeon: Gayland Curry, MD,FACS;  [facility];  Service: General;  Laterality: N/A;    Social History   Socioeconomic History  . Marital status: Married    Spouse name: Verdis Frederickson  . Number of children: 5  . Years of education: 9  . Highest education level: Not on file  Occupational History  . 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  Tobacco Use  . Smoking status: Never Smoker  . Smokeless tobacco: Never Used  Vaping Use  . Vaping Use: Never used  Substance and Sexual Activity  . Alcohol use: Not Currently    Alcohol/week: 0.0 standard drinks    Comment: 07/13/2018 "used to drink; none since 2015"  . Drug use: Never  . Sexual  activity: Yes    Comment: Viagra helps a bit.  Other Topics Concern  . Not on file  Social History Narrative   C[place]   Lives at home with wife, a son and his girlfriend   Social Determinants of Radio broadcast assistant Strain: Low Risk   . Difficulty of Paying Living Expenses: Not hard at all  Food Insecurity: No Food Insecurity  . Worried About Charity fundraiser in the Last Year: Never true  . Ran Out of Food in the Last Year: Never true  Transportation Needs: No Transportation Needs  . Lack of Transportation (Medical): No  . Lack of Transportation (Non-Medical): No  Physical Activity: Not on file  Stress: Not on file  Social Connections: Not on file  Intimate Partner Violence: Not At Risk  . Fear of Current or Ex-Partner: No  . Emotionally Abused: No  . Physically Abused: No  . Sexually Abused: No   Family History  Problem Relation  Age of Onset  . Stroke Mother   . Heart disease Mother   . Heart disease Father   . Diabetes Father   . Kidney disease Father        , history of stones as well  . Cancer Sister 25       unknown soft tissue cancer of leg  . Depression Sister   . Varicose Veins Son   . Diabetes Brother      Review of Systems  Constitutional: Negative for fatigue.  HENT: Negative for dental problem.   Eyes: Negative for visual disturbance (Has not had eye check this year.  Has foreign body sensation in right eye for 2 weeks.).  Respiratory: Negative for shortness of breath.   Cardiovascular: Negative for chest pain, palpitations and leg swelling.  Gastrointestinal: Negative for abdominal pain and blood in stool.       Hernia in left groin.  States 1.5 years.  Has had 2 hernias repaired.  Sent to Dr. Redmond Pulling in 2019 and did not have the money to have the repair.    Genitourinary:       ED.  Ran out of Viagra.  He would use if had a new prescription.  Musculoskeletal: Positive for arthralgias (Bilateral knee OA.).  Skin: Negative for  rash.  Neurological: Negative for weakness and numbness.  Psychiatric/Behavioral: Negative for dysphoric mood. The patient is not nervous/anxious.       Objective:   BP 120/70 (BP Location: Left Arm, Patient Position: Sitting, Cuff Size: Normal)   Pulse 74   Resp 12   Ht 6\' 3"  (1.905 m)   Wt 245 lb (111.1 kg)   BMI 30.62 kg/m   Physical Exam Constitutional:      Appearance: Normal appearance.  HENT:     Head: Normocephalic and atraumatic.     Right Ear: Tympanic membrane, ear canal and external ear normal.     Left Ear: Tympanic membrane, ear canal and external ear normal.     Nose: Nose normal.     Mouth/Throat:     Mouth: Mucous membranes are moist.     Pharynx: Oropharynx is clear.  Eyes:     Extraocular Movements: Extraocular movements intact.     Conjunctiva/sclera: Conjunctivae normal.     Pupils: Pupils are equal, round, and reactive to light.     Comments: Discs sharp bilaterally  Neck:     Thyroid: No thyroid mass or thyromegaly.  Cardiovascular:     Rate and Rhythm: Normal rate and regular rhythm.     Pulses:          Dorsalis pedis pulses are 2+ on the right side and 2+ on the left side.       Posterior tibial pulses are 2+ on the right side and 2+ on the left side.     Heart sounds: Normal heart sounds, S1 normal and S2 normal. No murmur heard. No friction rub. No S3 or S4 sounds.      Comments: No carotid bruits.  Carotid, radial, femoral pulses also normal and equal. Spider veins and varicosities of lower legs bilaterally with discoloration of skin. Pulmonary:     Effort: Pulmonary effort is normal.     Breath sounds: Normal breath sounds.  Chest:  Breasts:     Right: No axillary adenopathy or supraclavicular adenopathy.     Left: No axillary adenopathy or supraclavicular adenopathy.    Abdominal:     General: Bowel sounds are normal.  Palpations: Abdomen is soft. There is no hepatomegaly, splenomegaly or mass.     Tenderness: There is no  abdominal tenderness.     Hernia: There is no hernia in the umbilical area or ventral area.  Genitourinary:    Penis: Normal.      Testes:        Right: Mass or tenderness not present. Right testis is descended.        Left: Mass or tenderness not present. Left testis is descended.     Comments: Large left inguinal hernia extending into left scrotum.  With standing not able to reduce completely, but patient states he can reduce when lying down. Musculoskeletal:     Cervical back: Normal range of motion and neck supple.     Right lower leg: No edema.     Left lower leg: No edema.     Comments: Bilateral knees with significant varus deformity and hypertrophic changes.  Feet:     Right foot:     Protective Sensation: 10 sites tested. 10 sites sensed.     Skin integrity: Dry skin present.     Toenail Condition: Right toenails are abnormally thick.     Left foot:     Protective Sensation: 10 sites tested. 10 sites sensed.     Skin integrity: Dry skin present.     Toenail Condition: Left toenails are abnormally thick.  Lymphadenopathy:     Head:     Right side of head: No submental or submandibular adenopathy.     Left side of head: No submental or submandibular adenopathy.     Cervical: No cervical adenopathy.     Upper Body:     Right upper body: No supraclavicular or axillary adenopathy.     Left upper body: No supraclavicular or axillary adenopathy.     Lower Body: No right inguinal adenopathy. No left inguinal adenopathy.  Skin:    General: Skin is warm.     Capillary Refill: Capillary refill takes less than 2 seconds.     Findings: No rash.  Neurological:     General: No focal deficit present.     Mental Status: He is alert.     Cranial Nerves: Cranial nerves are intact.     Sensory: Sensation is intact.     Motor: Motor function is intact.     Coordination: Coordination is intact.     Gait: Gait is intact.     Deep Tendon Reflexes: Reflexes are normal and symmetric.   Psychiatric:        Attention and Perception: Attention normal.        Mood and Affect: Mood normal.        Speech: Speech normal.        Behavior: Behavior normal. Behavior is cooperative.        Thought Content: Thought content normal.        Judgment: Judgment normal.      Assessment & Plan  1.  CPE Guaiac Cards x 3 to return in 2 weeks. CBC, CMP, FLP, Urine microalbumin/crea, A1C Pneumococcal 23 valent final 08/2021  2.  DM with microalbuminuria:  Chronically well controlled.  Only taking Jardiance, mainly for CHF.  Labs.  Optometry referral for diabetic eye exam  3.  Left inguinal hernia, large and extrinsic compression on left colon, which does not allow for colon cancer surveillance in a patient with history of adenomatous polyps.   Will refer to gen surgery and see if can get back  in with Dr. Redmond Pulling through [facility].  4.  Hypertension/Cardiomyopathy/chronic afib with biventricular pacemaker:  Controlled and doing well.  5.  DJD of knees:  Would do well to eventually have knee replacements, but not quite there yet and will need cardiology clearance for significant surgery.  6.  History of anemia:  Labs  7.  Dyslipidemia:  Continues on statin.  Labs  8.  History of adenomatous colon polyps:  Will see about getting large left inguinal hernia repaired and then back to GI for adequate exam.  Guaiac cards as above.

## 2020-12-21 LAB — CBC WITH DIFFERENTIAL/PLATELET
Basophils Absolute: 0 10*3/uL (ref 0.0–0.2)
Basos: 1 %
EOS (ABSOLUTE): 0.1 10*3/uL (ref 0.0–0.4)
Eos: 2 %
Hematocrit: 40.2 % (ref 37.5–51.0)
Hemoglobin: 13.8 g/dL (ref 13.0–17.7)
Immature Grans (Abs): 0 10*3/uL (ref 0.0–0.1)
Immature Granulocytes: 0 %
Lymphocytes Absolute: 1.7 10*3/uL (ref 0.7–3.1)
Lymphs: 30 %
MCH: 31.2 pg (ref 26.6–33.0)
MCHC: 34.3 g/dL (ref 31.5–35.7)
MCV: 91 fL (ref 79–97)
Monocytes Absolute: 0.5 10*3/uL (ref 0.1–0.9)
Monocytes: 9 %
Neutrophils Absolute: 3.3 10*3/uL (ref 1.4–7.0)
Neutrophils: 58 %
Platelets: 173 10*3/uL (ref 150–450)
RBC: 4.42 x10E6/uL (ref 4.14–5.80)
RDW: 13.4 % (ref 11.6–15.4)
WBC: 5.7 10*3/uL (ref 3.4–10.8)

## 2020-12-21 LAB — COMPREHENSIVE METABOLIC PANEL
ALT: 18 IU/L (ref 0–44)
AST: 15 IU/L (ref 0–40)
Albumin/Globulin Ratio: 1.8 (ref 1.2–2.2)
Albumin: 4.9 g/dL — ABNORMAL HIGH (ref 3.7–4.7)
Alkaline Phosphatase: 98 IU/L (ref 44–121)
BUN/Creatinine Ratio: 20 (ref 10–24)
BUN: 27 mg/dL (ref 8–27)
Bilirubin Total: 1.3 mg/dL — ABNORMAL HIGH (ref 0.0–1.2)
CO2: 24 mmol/L (ref 20–29)
Calcium: 9.3 mg/dL (ref 8.6–10.2)
Chloride: 101 mmol/L (ref 96–106)
Creatinine, Ser: 1.33 mg/dL — ABNORMAL HIGH (ref 0.76–1.27)
Globulin, Total: 2.7 g/dL (ref 1.5–4.5)
Glucose: 122 mg/dL — ABNORMAL HIGH (ref 65–99)
Potassium: 4.5 mmol/L (ref 3.5–5.2)
Sodium: 141 mmol/L (ref 134–144)
Total Protein: 7.6 g/dL (ref 6.0–8.5)
eGFR: 56 mL/min/{1.73_m2} — ABNORMAL LOW (ref >59)

## 2020-12-21 LAB — LIPID PANEL W/O CHOL/HDL RATIO
Cholesterol, Total: 117 mg/dL (ref 100–199)
HDL: 36 mg/dL — ABNORMAL LOW (ref >39)
LDL Chol Calc (NIH): 66 mg/dL (ref 0–99)
Triglycerides: 76 mg/dL (ref 0–149)
VLDL Cholesterol Cal: 15 mg/dL (ref 5–40)

## 2020-12-21 LAB — HGB A1C W/O EAG: Hgb A1c MFr Bld: 7 % — ABNORMAL HIGH (ref 4.8–5.6)

## 2020-12-21 LAB — MICROALBUMIN / CREATININE URINE RATIO
Creatinine, Urine: 90.2 mg/dL
Microalb/Creat Ratio: 4 mg/g creat (ref 0–29)
Microalbumin, Urine: 3.2 ug/mL

## 2020-12-25 ENCOUNTER — Other Ambulatory Visit (INDEPENDENT_AMBULATORY_CARE_PROVIDER_SITE_OTHER): Payer: Self-pay | Admitting: Internal Medicine

## 2020-12-25 DIAGNOSIS — Z1211 Encounter for screening for malignant neoplasm of colon: Secondary | ICD-10-CM

## 2020-12-26 LAB — POC HEMOCCULT BLD/STL (HOME/3-CARD/SCREEN)
Card #2 Fecal Occult Blod, POC: NEGATIVE
Card #3 Fecal Occult Blood, POC: NEGATIVE
Fecal Occult Blood, POC: NEGATIVE

## 2021-01-03 ENCOUNTER — Telehealth: Payer: Self-pay | Admitting: Internal Medicine

## 2021-01-03 NOTE — Telephone Encounter (Signed)
Patient called asking for a refill of furosemide (LASIX) 40 MG tablet.   Also, Patient wanted to let you know that he feel that Vania Rea is not helping him with his sugar like Metformin was helping. He would like to know if there's anything you can change about that.

## 2021-01-07 NOTE — Telephone Encounter (Signed)
The Kyle Burke is not just for his DM but also for his heart failure.  Am concerned having him on Metformin with his heart failure and how it can affect kidney function.  He actually was well controlled only with diet before starting the Jardiance--would like for him to do better with diet and physical activity.

## 2021-01-08 NOTE — Telephone Encounter (Signed)
Patient was notified of Doctor's response and recommendations.

## 2021-01-09 ENCOUNTER — Ambulatory Visit (INDEPENDENT_AMBULATORY_CARE_PROVIDER_SITE_OTHER): Payer: Self-pay

## 2021-01-09 DIAGNOSIS — I5022 Chronic systolic (congestive) heart failure: Secondary | ICD-10-CM

## 2021-01-10 LAB — CUP PACEART REMOTE DEVICE CHECK
Battery Remaining Longevity: 76 mo
Battery Remaining Percentage: 95.5 %
Battery Voltage: 2.96 V
Brady Statistic AP VP Percent: 96 %
Brady Statistic AP VS Percent: 0 %
Brady Statistic AS VP Percent: 1 %
Brady Statistic AS VS Percent: 1 %
Brady Statistic RA Percent Paced: 92 %
Date Time Interrogation Session: 20220525020010
Implantable Lead Implant Date: 20191126
Implantable Lead Implant Date: 20191126
Implantable Lead Implant Date: 20191126
Implantable Lead Location: 753858
Implantable Lead Location: 753860
Implantable Lead Location: 753860
Implantable Lead Model: 3830
Implantable Pulse Generator Implant Date: 20191126
Lead Channel Impedance Value: 540 Ohm
Lead Channel Impedance Value: 600 Ohm
Lead Channel Impedance Value: 700 Ohm
Lead Channel Pacing Threshold Amplitude: 0.5 V
Lead Channel Pacing Threshold Amplitude: 0.875 V
Lead Channel Pacing Threshold Amplitude: 1 V
Lead Channel Pacing Threshold Pulse Width: 0.5 ms
Lead Channel Pacing Threshold Pulse Width: 0.5 ms
Lead Channel Pacing Threshold Pulse Width: 1 ms
Lead Channel Sensing Intrinsic Amplitude: 1 mV
Lead Channel Sensing Intrinsic Amplitude: 11.4 mV
Lead Channel Setting Pacing Amplitude: 2 V
Lead Channel Setting Pacing Amplitude: 2 V
Lead Channel Setting Pacing Amplitude: 2.5 V
Lead Channel Setting Pacing Pulse Width: 0.5 ms
Lead Channel Setting Pacing Pulse Width: 0.5 ms
Lead Channel Setting Sensing Sensitivity: 0.7 mV
Pulse Gen Model: 3562
Pulse Gen Serial Number: 9060242

## 2021-01-18 ENCOUNTER — Other Ambulatory Visit: Payer: Self-pay | Admitting: Internal Medicine

## 2021-01-18 MED ORDER — FUROSEMIDE 40 MG PO TABS
ORAL_TABLET | ORAL | 3 refills | Status: DC
Start: 2021-01-18 — End: 2021-11-01

## 2021-01-18 NOTE — Telephone Encounter (Signed)
Called patient notified him of refill.

## 2021-02-01 LAB — GLUCOSE, POCT (MANUAL RESULT ENTRY): POC Glucose: 158 mg/dl — AB (ref 70–99)

## 2021-02-04 NOTE — Progress Notes (Signed)
Remote pacemaker transmission.   

## 2021-03-15 ENCOUNTER — Telehealth: Payer: Self-pay | Admitting: *Deleted

## 2021-03-15 ENCOUNTER — Encounter: Payer: Self-pay | Admitting: Internal Medicine

## 2021-03-15 DIAGNOSIS — I5022 Chronic systolic (congestive) heart failure: Secondary | ICD-10-CM

## 2021-03-15 NOTE — Progress Notes (Signed)
PERIOPERATIVE PRESCRIPTION FOR IMPLANTED CARDIAC DEVICE PROGRAMMING   Patient Information:  Patient: Kyle Burke  MRN: [id number]  Date of Birth: Jul 11, 1946      Planned Procedure:  LEFT INGUINAL HERNIA SURGERY w/MESH  Surgeon:  [facility]; DR. Greer Pickerel Date of Procedure:  TBD    Device Information:   Clinic EP Physician:   Dr. Cristopher Peru Device Type:  Pacemaker Manufacturer and Phone #:  St. Jude/Abbott: [phone] Pacemaker Dependent?:  Unkown Date of Last Device Check:  01/09/21 (Remote)        Normal Device Function?:  Yes     Electrophysiologist's Recommendations:   Have magnet available. Provide continuous ECG monitoring when magnet is used or reprogramming is to be performed.  Procedure should not interfere with device function.  No device programming or magnet placement needed.  Per Device Clinic Standing Orders, Simone Curia  03/15/2021 10:14 AM

## 2021-03-15 NOTE — Telephone Encounter (Signed)
   [facility] Pre-operative Risk Assessment    Patient Name: Kyle Burke  DOB: [birth year] MRN: [id number]  HEARTCARE STAFF:  - IMPORTANT!!!!!! Under Visit Info/Reason for Call, type in Other and utilize the format Clearance MM/DD/YY or Clearance TBD. Do not use dashes or single digits. - Please review there is not already an duplicate clearance open for this procedure. - If request is for dental extraction, please clarify the # of teeth to be extracted. - If the patient is currently at the dentist's office, call Pre-Op Callback Staff (MA/nurse) to input urgent request.  - If the patient is not currently in the dentist office, please route to the Pre-Op pool.  Request for surgical clearance:  What type of surgery is being performed? LEFT INGUINAL HERNIA SURGERY w/MESH  When is this surgery scheduled? TBD  What type of clearance is required (medical clearance vs. Pharmacy clearance to hold med vs. Both)? BOTH AS WELL AS DEVICE CLEARANCE IS NEEDED  Are there any medications that need to be held prior to surgery and how long? [facility]  Practice name and name of physician performing surgery? [facility]; DR. Greer Pickerel  What is the office phone number? [phone]   7.   What is the office fax number? [place]: Lindwood Coke, RN  8.   Anesthesia type (None, local, MAC, general) ? GENERAL   Julaine Hua 03/15/2021, 9:16 AM  _________________________________________________________________   (provider comments below)

## 2021-03-16 NOTE — Telephone Encounter (Signed)
Patient with diagnosis of atrial fibrillation on Xarelto for anticoagulation.    Procedure: Left inguinal hernia surgery w/mesh Date of procedure: TBD   CHA2DS2-VASc Score = 5  This indicates a 7.2% annual risk of stroke. The patient's score is based upon: CHF History: Yes HTN History: Yes Diabetes History: Yes Stroke History: No Vascular Disease History: Yes Age Score: 1 Gender Score: 0  CrCl 67 (with adjusted body weight) Platelet count 173  Per office protocol, patient can hold Xarelto for 2 days prior to procedure.   Patient will not need bridging with Lovenox (enoxaparin) around procedure.  Patient should restart Xarelto on the evening of procedure or day after, at discretion of procedure MD

## 2021-03-20 NOTE — Telephone Encounter (Signed)
   Name: Kyle Burke  DOB: 04-19-1946  MRN: [id number]  Primary Cardiologist: CHF clinic/Dr. Bensimhon Primary EP: Dr. Lovena Le  Chart reviewed as part of pre-operative protocol coverage. Because of [name] past medical history and time since last visit, he will require a follow-up visit in order to better assess preoperative cardiovascular risk.  Pre-op covering staff: - Please schedule appointment and call patient to inform them. If patient already had an upcoming appointment within acceptable timeframe, please add "pre-op clearance" to the appointment notes so provider is aware. - Please contact requesting surgeon's office via preferred method (i.e, phone, fax) to inform them of need for appointment prior to surgery.  If applicable, this message will also be routed to pharmacy pool and/or primary cardiologist for input on holding anticoagulant/antiplatelet agent as requested below so that this information is available to the clearing provider at time of patient's appointment.   Please arrange for follow-up visit with CHF clinic as patient is overdue for follow-up.  [place], [place]  03/20/2021, 2:50 PM

## 2021-03-20 NOTE — Telephone Encounter (Signed)
Per pre op provider will have primary card/HF card Dr. Haroldine Laws clear pt. I will forward these notes to Dr. Haroldine Laws. Will send FYI to surgeon's office notes have been forwarded to Dr. Haroldine Laws for clearance.

## 2021-03-26 NOTE — Telephone Encounter (Signed)
Echo and appt sch 04/11/21

## 2021-03-28 ENCOUNTER — Telehealth: Payer: Self-pay | Admitting: Internal Medicine

## 2021-03-28 NOTE — Telephone Encounter (Signed)
Pt. Called asking if he needs to be seen for gum bleeding. Pt. Does not know if the bleeding is coming from his gums or a tooth on the left side. Bleeding started on Tuesday and it only happens at night. Pt denied pain or any other symptoms    Please advise

## 2021-03-28 NOTE — Telephone Encounter (Signed)
Acute appt in next week or so

## 2021-03-29 ENCOUNTER — Encounter: Payer: Self-pay | Admitting: Internal Medicine

## 2021-03-29 ENCOUNTER — Other Ambulatory Visit: Payer: Self-pay

## 2021-03-29 ENCOUNTER — Ambulatory Visit: Payer: Self-pay | Admitting: Internal Medicine

## 2021-03-29 VITALS — BP 110/70 | HR 76 | Resp 12 | Ht 75.0 in | Wt 247.5 lb

## 2021-03-29 DIAGNOSIS — K047 Periapical abscess without sinus: Secondary | ICD-10-CM

## 2021-03-29 MED ORDER — PENICILLIN V POTASSIUM 500 MG PO TABS: ORAL_TABLET | ORAL | 0 refills | Status: DC

## 2021-03-29 NOTE — Progress Notes (Signed)
    Subjective:    Patient ID: Kyle Burke, male   DOB: 1946/03/30, 75 y.o.   MRN: [id number]   HPI  Has noted bleeding from somewhere in upper right jaw area.  Not clear if he can see if from gums.  He does take xarelto.  No pain with the bleeding and not large amount.   No injury, pain or fever.  Just taste the blood.   Does not have bleeding with brushing teeth.   No obvious cause of when has the bleeding.    Current Meds  Medication Sig   acetaminophen (TYLENOL) 650 MG CR tablet Take 650 mg by mouth every 8 (eight) hours as needed for pain.    AgaMatrix Ultra-Thin Lancets MISC Check blood glucose twice daily before meals   atorvastatin (LIPITOR) 80 MG tablet TAKE 1 TABLET ('80MG'$  TOTAL) BY MOUTH ONCE A DAY   Blood Glucose Monitoring Suppl (AGAMATRIX PRESTO PRO METER) DEVI Twice daily blood glucose checks before meals   carvedilol (COREG) 6.25 MG tablet TAKE 1 AND 1/2 TABLETS (9.'375MG'$ ) BY MOUTH 2 TIMES A DAY WITH A MEAL   digoxin (LANOXIN) 0.25 MG tablet TAKE 1/2 TABLET (0.'125MG'$  TOTAL) BY MOUTH DAILY   empagliflozin (JARDIANCE) 10 MG TABS tablet Take 10 mg by mouth daily before breakfast.   ferrous gluconate (FERGON) 324 MG tablet Take 1 tablet (324 mg total) by mouth daily with breakfast.   furosemide (LASIX) 40 MG tablet TAKE 1.5 TABLETS BY MOUTH EVERY MORNING AND 1 TABLET EVERY EVENING   glucose blood (AGAMATRIX PRESTO TEST) test strip Twice daily glucose checks before meals   Glycerin-Hypromellose-PEG 400 0.2-0.2-1 % SOLN Place 2 drops into both eyes daily as needed (dry eyes).    losartan (COZAAR) 50 MG tablet Take 1 tablet (50 mg total) by mouth at bedtime.   rivaroxaban (XARELTO) 20 MG TABS tablet TAKE 1 TABLET BY MOUTH ONCE DAILY WITH SUPPER   sildenafil (VIAGRA) 100 MG tablet TAKE ONE-HALF TO ONE TABLET BY MOUTH ONE-HALF HOUR BEFORE ACTIVITY. ONLY ONE TABLET MAX IN 24 HOURS   Allergies  Allergen Reactions   Spironolactone Other (See Comments)    Gynecomastia      Review of Systems    Objective:   BP 110/70 (BP Location: Left Arm, Patient Position: Sitting, Cuff Size: Normal)   Pulse 76   Resp 12   Ht '6\' 3"'$  (1.905 m)   Wt 247 lb 8 oz (112.3 kg)   BMI 30.94 kg/m   Physical Exam Broken caried second to posterior molar in right upper jaw with mild swelling of gingiva and slight bleeding.  No fluctuance of surrounding swollen gingiva. Neck:  Supple, No adenopathy CV:  RRR Lungs:  CTA    Assessment & Plan    Abscessed Tooth, right upper molar.  Penicillin VK 500 mg 3 times daily for 7 days. Patient taking chronic anticoagulation.  Can hold for 5 days prior to any needed procedure.  Urgent dental referral.

## 2021-04-10 ENCOUNTER — Ambulatory Visit (INDEPENDENT_AMBULATORY_CARE_PROVIDER_SITE_OTHER): Payer: Self-pay

## 2021-04-10 DIAGNOSIS — I5022 Chronic systolic (congestive) heart failure: Secondary | ICD-10-CM

## 2021-04-10 LAB — CUP PACEART REMOTE DEVICE CHECK
Battery Remaining Longevity: 37 mo
Battery Remaining Percentage: 49 %
Battery Voltage: 2.96 V
Brady Statistic AP VP Percent: 95 %
Brady Statistic AP VS Percent: 0 %
Brady Statistic AS VP Percent: 1 %
Brady Statistic AS VS Percent: 1 %
Brady Statistic RA Percent Paced: 92 %
Date Time Interrogation Session: 20220824020011
Implantable Lead Implant Date: 20191126
Implantable Lead Implant Date: 20191126
Implantable Lead Implant Date: 20191126
Implantable Lead Location: 753858
Implantable Lead Location: 753860
Implantable Lead Location: 753860
Implantable Lead Model: 3830
Implantable Pulse Generator Implant Date: 20191126
Lead Channel Impedance Value: 460 Ohm
Lead Channel Impedance Value: 540 Ohm
Lead Channel Impedance Value: 660 Ohm
Lead Channel Pacing Threshold Amplitude: 0.5 V
Lead Channel Pacing Threshold Amplitude: 1 V
Lead Channel Pacing Threshold Amplitude: 1 V
Lead Channel Pacing Threshold Pulse Width: 0.5 ms
Lead Channel Pacing Threshold Pulse Width: 0.5 ms
Lead Channel Pacing Threshold Pulse Width: 1 ms
Lead Channel Sensing Intrinsic Amplitude: 1 mV
Lead Channel Sensing Intrinsic Amplitude: 9.6 mV
Lead Channel Setting Pacing Amplitude: 2 V
Lead Channel Setting Pacing Amplitude: 2 V
Lead Channel Setting Pacing Amplitude: 2.5 V
Lead Channel Setting Pacing Pulse Width: 0.5 ms
Lead Channel Setting Pacing Pulse Width: 0.5 ms
Lead Channel Setting Sensing Sensitivity: 0.7 mV
Pulse Gen Model: 3562
Pulse Gen Serial Number: 9060242

## 2021-04-11 ENCOUNTER — Other Ambulatory Visit: Payer: Self-pay

## 2021-04-11 ENCOUNTER — Ambulatory Visit (HOSPITAL_BASED_OUTPATIENT_CLINIC_OR_DEPARTMENT_OTHER)
Admission: RE | Admit: 2021-04-11 | Discharge: 2021-04-11 | Disposition: A | Discharge status: Home / Self Care | Payer: Self-pay | Source: Ambulatory Visit | Attending: Internal Medicine | Admitting: Internal Medicine

## 2021-04-11 ENCOUNTER — Encounter (HOSPITAL_COMMUNITY): Payer: Self-pay | Admitting: Internal Medicine

## 2021-04-11 ENCOUNTER — Ambulatory Visit (HOSPITAL_COMMUNITY)
Admission: RE | Admit: 2021-04-11 | Discharge: 2021-04-11 | Disposition: A | Discharge status: Home / Self Care | Payer: Self-pay | Source: Ambulatory Visit | Attending: Internal Medicine | Admitting: Internal Medicine

## 2021-04-11 VITALS — BP 112/70 | HR 72 | Wt 248.0 lb

## 2021-04-11 DIAGNOSIS — I110 Hypertensive heart disease with heart failure: Secondary | ICD-10-CM | POA: Insufficient documentation

## 2021-04-11 DIAGNOSIS — I2510 Atherosclerotic heart disease of native coronary artery without angina pectoris: Secondary | ICD-10-CM | POA: Insufficient documentation

## 2021-04-11 DIAGNOSIS — I4821 Permanent atrial fibrillation: Secondary | ICD-10-CM

## 2021-04-11 DIAGNOSIS — I447 Left bundle-branch block, unspecified: Secondary | ICD-10-CM

## 2021-04-11 DIAGNOSIS — E785 Hyperlipidemia, unspecified: Secondary | ICD-10-CM | POA: Insufficient documentation

## 2021-04-11 DIAGNOSIS — I5022 Chronic systolic (congestive) heart failure: Secondary | ICD-10-CM

## 2021-04-11 DIAGNOSIS — I351 Nonrheumatic aortic (valve) insufficiency: Secondary | ICD-10-CM | POA: Insufficient documentation

## 2021-04-11 DIAGNOSIS — Z01810 Encounter for preprocedural cardiovascular examination: Secondary | ICD-10-CM

## 2021-04-11 DIAGNOSIS — E119 Type 2 diabetes mellitus without complications: Secondary | ICD-10-CM | POA: Insufficient documentation

## 2021-04-11 LAB — ECHOCARDIOGRAM COMPLETE
AR max vel: 2.73 cm2
AV Area VTI: 2.36 cm2
AV Area mean vel: 2.47 cm2
AV Mean grad: 7 mmHg
AV Peak grad: 11 mmHg
Ao pk vel: 1.66 m/s
Area-P 1/2: 3.73 cm2
P 1/2 time: 709 msec
S' Lateral: 4.1 cm
Single Plane A4C EF: 59.6 %

## 2021-04-11 NOTE — Progress Notes (Signed)
PCP: Dr Amil Amen  Primary HF Cardiologist: Dr Vaughan Browner  EP : Dr Lovena Le.    HPI: Kyle Burke is a 75 y.o. male [place] speaking male with a history of  chronic systolic heart failure, NICM,  LBBB,  DMII, chroinc A fib, and chronic anticoagulation with xarelto. He does not have medical insurance. He is not a [place] citizen. He requires assistance with medications.  He has been followed by Dr Philbert Riser at [facility]. He was last seen 01/2018. He was stable at that time. Weight was 220 pounds.   Admitted [id number] with A/C systolic heart failure. Diuresed with IV lasix and once diuresed transitioned to lasix 40 mg twice a day. EP consulted for LBBB. Underwent placement of CRT-P on 07/13/18.  Follows with Dr. Lovena Le.   Last seen in HF clinic January 2021. Stable at that time and not requiring loop diuretic.   Patient here today for preoperative cardiac evaluation for left inguinal hernia repair with mesh with Dr. Redmond Pulling at [facility]. Surgery not yet scheduled.  Here with his son and an interpreter. Feels good. Mowing grass for 2hrs per day with push mower. No CP or SOB. No edema, orthopnea or PND. No bleeding with Xarelto   Echo today 04/11/21: EF 55-60%. Severe biatrial enlargement. Personally reviewed    Cardiac Testing  ECHO 01/24/2019 EF 20-25%  Echo 06/2018- LVEF is further decreased to 15% with severe global hypokinesis, inferior akinesis and septal dyskinesis.RV mildly reduced.   ECHO 2017 [place]- EF 20-25% RV mildly dilated. Severe hypokinesis.   RHC/ LHC [facility] 2018 at [facility] Nonobs LAD 50%,mild om2.  RA 12 PCWP 31   Review of systems complete and found to be negative unless listed in HPI.   SH:  Social History   Socioeconomic History   Marital status: Married    Spouse name: Verdis Frederickson   Number of children: 5   Years of education: 9   Highest education level: Not on file  Occupational History   Occupation: Concrete constrution-retired    Comment: Helps with  mowing lawns since his heart issues.  Tobacco Use   Smoking status: Never   Smokeless tobacco: Never  Vaping Use   Vaping Use: Never used  Substance and Sexual Activity   Alcohol use: Not Currently    Alcohol/week: 0.0 standard drinks    Comment: 07/13/2018 "used to drink; none since 2015"   Drug use: Never   Sexual activity: Yes    Comment: Viagra helps a bit.  Other Topics Concern   Not on file  Social History Narrative   [place]   Lives at home with wife, a son and his girlfriend   Social Determinants of Radio broadcast assistant Strain: Not on file  Food Insecurity: Not on file  Transportation Needs: Not on file  Physical Activity: Not on file  Stress: Not on file  Social Connections: Not on file  Intimate Partner Violence: Not on file    FH:  Family History  Problem Relation Age of Onset   Stroke Mother    Heart disease Mother    Heart disease Father    Diabetes Father    Kidney disease Father        , history of stones as well   Cancer Sister 102       unknown soft tissue cancer of leg   Depression Sister    Varicose Veins Son    Diabetes Brother     Past Medical History:  Diagnosis Date   Arthritis    "knees" (07/13/2018)   Atrial fibrillation ([place]) 2016   CAD (coronary artery disease)    High cholesterol    Hypertension 2006   Idiopathic cardiomyopathy ([place]) 11/01/2014   Followed by Dr. 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   Refusal of blood transfusions as patient is Jehovah's Witness    Type II diabetes mellitus ([place]) 2001    Current Outpatient Medications  Medication Sig Dispense Refill   acetaminophen (TYLENOL) 650 MG CR tablet Take 650 mg by mouth every 8 (eight) hours as needed for pain.      AgaMatrix Ultra-Thin Lancets MISC Check blood glucose twice daily before  meals 100 each 11   atorvastatin (LIPITOR) 80 MG tablet TAKE 1 TABLET ('80MG'$  TOTAL) BY MOUTH ONCE A DAY 30 tablet 7   Blood Glucose Monitoring Suppl (AGAMATRIX PRESTO PRO METER) DEVI Twice daily blood glucose checks before meals 1 Device 0   carvedilol (COREG) 6.25 MG tablet TAKE 1 AND 1/2 TABLETS (9.'375MG'$ ) BY MOUTH 2 TIMES A DAY WITH A MEAL 90 tablet 6   digoxin (LANOXIN) 0.25 MG tablet TAKE 1/2 TABLET (0.'125MG'$  TOTAL) BY MOUTH DAILY 15 tablet 11   empagliflozin (JARDIANCE) 10 MG TABS tablet Take 10 mg by mouth daily before breakfast. 30 tablet 11   ferrous gluconate (FERGON) 324 MG tablet Take 1 tablet (324 mg total) by mouth daily with breakfast.  3   furosemide (LASIX) 40 MG tablet TAKE 1.5 TABLETS BY MOUTH EVERY MORNING AND 1 TABLET EVERY EVENING 225 tablet 3   glucose blood (AGAMATRIX PRESTO TEST) test strip Twice daily glucose checks before meals 100 each 12   Glycerin-Hypromellose-PEG 400 0.2-0.2-1 % SOLN Place 2 drops into both eyes daily as needed (dry eyes).      losartan (COZAAR) 50 MG tablet Take 1 tablet (50 mg total) by mouth at bedtime. 30 tablet 11   rivaroxaban (XARELTO) 20 MG TABS tablet TAKE 1 TABLET BY MOUTH ONCE DAILY WITH SUPPER 30 tablet 11   sildenafil (VIAGRA) 100 MG tablet TAKE ONE-HALF TO ONE TABLET BY MOUTH ONE-HALF HOUR BEFORE ACTIVITY. ONLY ONE TABLET MAX IN 24 HOURS 30 tablet 4   No current facility-administered medications for this encounter.    Vitals:   04/11/21 1525  BP: 112/70  Pulse: 72  SpO2: 97%  Weight: 112.5 kg (248 lb)    Wt Readings from Last 3 Encounters:  04/11/21 112.5 kg (248 lb)  03/29/21 112.3 kg (247 lb 8 oz)  12/20/20 111.1 kg (245 lb)   PHYSICAL EXAM: Spanish interpreter present.  General:  Well appearing. No resp difficulty HEENT: normal Neck: supple. no JVD. Carotids 2+ bilat; no bruits. No lymphadenopathy or thryomegaly appreciated. Cor: PMI nondisplaced. Regular rate & rhythm. No rubs, gallops or murmurs. Lungs:  clear Abdomen: obese soft, nontender, nondistended. No hepatosplenomegaly. No bruits or masses. Good bowel sounds. Extremities: no cyanosis, clubbing, rash, edema Neuro: alert & orientedx3, cranial nerves grossly intact. moves all 4 extremities w/o difficulty. Affect pleasant   ECG 09/06/19: AF with biv pacing Personally reviewed    ASSESSMENT & PLAN: 1. Chronic Systolic Heart Failure, NICM.  - LHC 2018 50% LAD.  - ECHO 06/2018 EF 15%  LA severely dilated. EF has been down for years.  - ECHO 01/24/19 ---->20-25%. - 07/13/2018 S/P St Jude CRT-P.  -  Echo today 04/11/21: EF 55-60%. Severe biatrial enlargement. Personally reviewed - NYHA I - Volume status looks good - Continue carvedilol 9.375 mg twice a day.   - Stop digoxin - Continue losartan 50 mg daily - Continue jardiance 10 mg daily - Intolerant spiro/inspra due to gynecomastia.  - He has had complete recovery of LV function with CRT.   2. LBBB s/p St Jude CRT-P.  - Follows  With Dr Lovena Le. - Device working well. Has had complete recovery of LV function with CRT.  3. Chronic A fib - Rate controlled.  - Continue xarelto 20 mg daily. Denies bleeding   4. CKD Stage IIIa - creatinine runs 1.2-1.5   5. DMII - Per PCP.  - Continue Jardiance  6. Pre-op risk assessment for inguinal hernia repair with mesh - Given normal EF. NYHA I symptoms. He is low-risk for peri-op CV complications. Can proceed with surgery without further testing   Glori Bickers, MD  3:58 PM

## 2021-04-11 NOTE — Progress Notes (Signed)
  Echocardiogram 2D Echocardiogram has been performed.  Kyle Burke M 04/11/2021, 3:17 PM

## 2021-04-11 NOTE — Patient Instructions (Signed)
Detener la digoxina  Llame a nuestra oficina en julio de 2023 para programar su cita de seguimiento  Si tiene [place] pregunta o inquietud antes de su prxima cita, envenos un mensaje a travs de mychart o llame a nuestra oficina al [id number].  PARA DEJAR UN MENSAJE PARA LA ENFERMERA SELECCIONE LA OPCIN 2, POR FAVOR DEJE UN MENSAJE QUE INCLUYA:  SU NOMBRE  FECHA DE NACIMIENTO  NMERO DE DEVOLUCIN DE LLAMADA  MOTIVO DE LA LLAMADA**esto es importante ya que damos prioridad a las devoluciones de llamadas  RECIBIR UNA LLAMADA EL MISMO DA SIEMPRE QUE LLAME ANTES DE LAS 4:00 PM

## 2021-04-25 NOTE — Progress Notes (Signed)
Remote pacemaker transmission.   

## 2021-05-06 ENCOUNTER — Other Ambulatory Visit: Payer: Self-pay

## 2021-05-06 DIAGNOSIS — K047 Periapical abscess without sinus: Secondary | ICD-10-CM | POA: Insufficient documentation

## 2021-05-06 MED ORDER — ATORVASTATIN CALCIUM 80 MG PO TABS
ORAL_TABLET | ORAL | 7 refills | Status: DC
Start: 2021-05-06 — End: 2022-01-03

## 2021-06-03 NOTE — Progress Notes (Signed)
Sent message, via epic in basket, requesting orders in epic from surgeon.  

## 2021-06-04 ENCOUNTER — Other Ambulatory Visit: Payer: Self-pay

## 2021-06-04 MED ORDER — CARVEDILOL 6.25 MG PO TABS
ORAL_TABLET | ORAL | 6 refills | Status: DC
Start: 2021-06-04 — End: 2022-01-03

## 2021-06-06 ENCOUNTER — Ambulatory Visit: Payer: Self-pay | Admitting: General Surgery

## 2021-06-06 ENCOUNTER — Encounter: Payer: Self-pay | Admitting: Internal Medicine

## 2021-06-06 DIAGNOSIS — Z01818 Encounter for other preprocedural examination: Secondary | ICD-10-CM

## 2021-06-06 NOTE — Progress Notes (Signed)
PERIOPERATIVE PRESCRIPTION FOR IMPLANTED CARDIAC DEVICE PROGRAMMING  Patient Information: Name:  Kyle Burke  DOB:  1946-07-29  MRN:  [id number]    Planned Procedure: open repair of left incarcerated inguinal hernia  Surgeon:  Greer Pickerel  Date of Procedure:  06/20/21  Cautery will be used.  Position during surgery:     Please send documentation back to:  [facility] (Fax # [phone])   Device Information:  Clinic EP Physician:  Cristopher Peru, MD   Device Type:  Pacemaker Manufacturer and Phone #:  St. Jude/Abbott: [phone] Pacemaker Dependent?:  Unkown Date of Last Device Check:  09/06/20 Normal Device Function?:  Yes.    Electrophysiologist's Recommendations:  Have magnet available. Provide continuous ECG monitoring when magnet is used or reprogramming is to be performed.  Procedure may interfere with device function.  Magnet should be placed over device during procedure.  Per Device Clinic Standing Orders, Simone Curia, RN  5:12 PM 06/06/2021

## 2021-06-07 ENCOUNTER — Other Ambulatory Visit: Payer: Self-pay | Admitting: Internal Medicine

## 2021-06-07 ENCOUNTER — Encounter (HOSPITAL_COMMUNITY)
Admission: RE | Admit: 2021-06-07 | Discharge: 2021-06-07 | Disposition: A | Discharge status: Home / Self Care | Payer: Self-pay | Source: Ambulatory Visit | Attending: General Surgery | Admitting: General Surgery

## 2021-06-07 ENCOUNTER — Encounter (HOSPITAL_COMMUNITY): Payer: Self-pay

## 2021-06-07 ENCOUNTER — Other Ambulatory Visit: Payer: Self-pay

## 2021-06-07 VITALS — BP 129/78 | HR 86 | Temp 98.2°F | Resp 18 | Ht 74.0 in | Wt 248.0 lb

## 2021-06-07 DIAGNOSIS — E119 Type 2 diabetes mellitus without complications: Secondary | ICD-10-CM | POA: Insufficient documentation

## 2021-06-07 DIAGNOSIS — Z01818 Encounter for other preprocedural examination: Secondary | ICD-10-CM | POA: Insufficient documentation

## 2021-06-07 HISTORY — DX: Cardiac arrhythmia, unspecified: I49.9

## 2021-06-07 LAB — CBC WITH DIFFERENTIAL/PLATELET
Abs Immature Granulocytes: 0 10*3/uL (ref 0.00–0.07)
Basophils Absolute: 0 10*3/uL (ref 0.0–0.1)
Basophils Relative: 1 %
Eosinophils Absolute: 0.3 10*3/uL (ref 0.0–0.5)
Eosinophils Relative: 6 %
HCT: 40.3 % (ref 39.0–52.0)
Hemoglobin: 13.1 g/dL (ref 13.0–17.0)
Immature Granulocytes: 0 %
Lymphocytes Relative: 27 %
Lymphs Abs: 1.5 10*3/uL (ref 0.7–4.0)
MCH: 30.6 pg (ref 26.0–34.0)
MCHC: 32.5 g/dL (ref 30.0–36.0)
MCV: 94.2 fL (ref 80.0–100.0)
Monocytes Absolute: 0.6 10*3/uL (ref 0.1–1.0)
Monocytes Relative: 11 %
Neutro Abs: 3 10*3/uL (ref 1.7–7.7)
Neutrophils Relative %: 55 %
Platelets: 173 10*3/uL (ref 150–400)
RBC: 4.28 MIL/uL (ref 4.22–5.81)
RDW: 12.8 % (ref 11.5–15.5)
WBC: 5.4 10*3/uL (ref 4.0–10.5)
nRBC: 0 % (ref 0.0–0.2)

## 2021-06-07 LAB — HEMOGLOBIN A1C
Hgb A1c MFr Bld: 6.5 % — ABNORMAL HIGH (ref 4.8–5.6)
Mean Plasma Glucose: 139.85 mg/dL

## 2021-06-07 LAB — BASIC METABOLIC PANEL
Anion gap: 5 (ref 5–15)
BUN: 33 mg/dL — ABNORMAL HIGH (ref 8–23)
CO2: 27 mmol/L (ref 22–32)
Calcium: 9 mg/dL (ref 8.9–10.3)
Chloride: 105 mmol/L (ref 98–111)
Creatinine, Ser: 1.27 mg/dL — ABNORMAL HIGH (ref 0.61–1.24)
GFR, Estimated: 59 mL/min — ABNORMAL LOW (ref >60)
Glucose, Bld: 110 mg/dL — ABNORMAL HIGH (ref 70–99)
Potassium: 3.9 mmol/L (ref 3.5–5.1)
Sodium: 137 mmol/L (ref 135–145)

## 2021-06-07 LAB — NO BLOOD PRODUCTS

## 2021-06-07 LAB — GLUCOSE, CAPILLARY: Glucose-Capillary: 103 mg/dL — ABNORMAL HIGH (ref 70–99)

## 2021-06-07 NOTE — Progress Notes (Signed)
Anesthesia Chart Review:   Case: [id number] Date/Time: 06/20/21 0915   Procedure: OPEN REPAIR OF RECURENT LEFT INCARCERATED INGUINAL HERNIA WITH MESH (Left)   Anesthesia type: General   Pre-op diagnosis: INCARCERATED LEFT INGUINAL HERNIA   [facility] / [facility]   Surgeons: Greer Pickerel, MD       DISCUSSION: Pt is 75 years old with hx NICM, chronic systolic HF, chronic Afib, LBBB, BiV pacemaker (CRT-P implanted 2019), CKD, HTN, DM  Pt to hold xarelto 5 days before surgery  Perioperative prescription for pacemaker:  Have magnet available. Provide continuous ECG monitoring when magnet is used or reprogramming is to be performed.  Procedure may interfere with device function.  Magnet should be placed over device during procedure.   VS: BP 129/78   Pulse 86   Temp 36.8 C (Oral)   Resp 18   Ht 6\' 2"  (1.88 m)   Wt 112.5 kg   SpO2 96%   BMI 31.84 kg/m   PROVIDERS: - PCP is Mack Hook, MD - HF cardiologist is Glori Bickers, MD. Cleared for surgery at low risk at last office visit 04/11/21.   LABS: Labs reviewed: Acceptable for surgery. (all labs ordered are listed, but only abnormal results are displayed)  Labs Reviewed  BASIC METABOLIC PANEL - Abnormal; Notable for the following components:      Result Value   Glucose, Bld 110 (*)    BUN 33 (*)    Creatinine, Ser 1.27 (*)    GFR, Estimated 59 (*)    All other components within normal limits  GLUCOSE, CAPILLARY - Abnormal; Notable for the following components:   Glucose-Capillary 103 (*)    All other components within normal limits  HEMOGLOBIN A1C - Abnormal; Notable for the following components:   Hgb A1c MFr Bld 6.5 (*)    All other components within normal limits  CBC WITH DIFFERENTIAL/PLATELET    EKG 04/11/21: Ventricular-paced rhythm with frequent Premature ventricular and fusion complexes   CV: Echo 04/11/21:  1. Left ventricular ejection fraction, by estimation, is 55 to 60%. The left  ventricle has normal function. The left ventricle has no regional wall motion abnormalities. The left ventricular internal cavity size was mildly dilated. Left ventricular diastolic function could not be evaluated.   2. Right ventricular systolic function is normal. The right ventricular size is normal. There is normal pulmonary artery systolic pressure.  3. Left atrial size was severely dilated.  4. Right atrial size was severely dilated.  5. The mitral valve is normal in structure. Mild mitral valve regurgitation. No evidence of mitral stenosis.  6. The aortic valve is normal in structure. There is mild calcification of the aortic valve. Aortic valve regurgitation is mild. No aortic stenosis is present.  7. The inferior vena cava is normal in size with greater than 50% respiratory variability, suggesting right atrial pressure of 3 mmHg.    Cardiac cath 08/25/16 (at [facility] - care everywhere):   Non-obstructive coronary artery disease.   High left ventricle end diastolic pressure.   Maximize medical therapy.   RLCP. RHC numbers as below. R radial 61F access - Non obs CAD - worst 50%  mLAD, mild OM2 disease. Out of proportion to reduced EF. LVEDP 25 c/w  wedge.  EBL <69mL. No specimens. TR for hemostasis.    Past Medical History:  Diagnosis Date   Arthritis    "knees" (07/13/2018)   Atrial fibrillation ([place]) 2016   CAD (coronary artery disease)  Dysrhythmia    High cholesterol    Hypertension 2006   Idiopathic cardiomyopathy ([place]) 11/01/2014   Followed by Dr. 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   Refusal of blood transfusions as patient is Jehovah's Witness    Type II diabetes mellitus ([place]) 2001    Past Surgical History:  Procedure Laterality Date   BI-VENTRICULAR PACEMAKER INSERTION (CRT-P)  07/13/2018   BIV PACEMAKER  INSERTION CRT-P N/A 07/13/2018   Procedure: BIV PACEMAKER INSERTION CRT-P;  Surgeon: Evans Lance, MD;  Location: [facility];  Service: Cardiovascular;  Laterality: N/A;   COLONOSCOPY WITH PROPOFOL N/A 05/14/2016   Procedure: COLONOSCOPY WITH PROPOFOL;  Surgeon: Jerene Bears, MD;  [facility];  Service: Gastroenterology;  Laterality: N/A;   COLONOSCOPY WITH PROPOFOL N/A 07/09/2020   Procedure: COLONOSCOPY WITH PROPOFOL;  Surgeon: Jerene Bears, MD;  [facility];  Service: Gastroenterology;  Laterality: N/A;   ESOPHAGOGASTRODUODENOSCOPY (EGD) WITH PROPOFOL N/A 05/14/2016   Procedure: ESOPHAGOGASTRODUODENOSCOPY (EGD) WITH PROPOFOL;  Surgeon: Jerene Bears, MD;  [facility];  Service: Gastroenterology;  Laterality: N/A;   HEMOSTASIS CLIP PLACEMENT  07/09/2020   Procedure: HEMOSTASIS CLIP PLACEMENT;  Surgeon: Jerene Bears, MD;  [facility];  Service: Gastroenterology;;   INCISE AND DRAIN ABCESS Left 1986   on leg   INGUINAL HERNIA REPAIR  01/16/2012   Procedure: LAPAROSCOPIC INGUINAL HERNIA;  Surgeon: Gayland Curry, MD,FACS;  [facility];  Service: General;  Laterality: Left;  Laparoscopic incarcerated Left inguinal hernia repair with mesh, umbilical hernia repair   POLYPECTOMY  07/09/2020   Procedure: POLYPECTOMY;  Surgeon: Jerene Bears, MD;  [facility];  Service: Gastroenterology;;  hot snare   UMBILICAL HERNIA REPAIR  01/16/2012   Procedure: HERNIA REPAIR UMBILICAL ADULT;  Surgeon: Gayland Curry, MD,FACS;  [facility];  Service: General;  Laterality: N/A;    MEDICATIONS:  acetaminophen (TYLENOL) 650 MG CR tablet   AgaMatrix Ultra-Thin Lancets MISC   atorvastatin (LIPITOR) 80 MG tablet   Blood Glucose Monitoring Suppl (AGAMATRIX PRESTO PRO METER) DEVI   carvedilol (COREG) 6.25 MG tablet   empagliflozin (JARDIANCE) 10 MG TABS tablet   ferrous gluconate (FERGON) 324 MG tablet   furosemide (LASIX) 40 MG tablet   glucose  blood (AGAMATRIX PRESTO TEST) test strip   Glycerin-Hypromellose-PEG 400 0.2-0.2-1 % SOLN   losartan (COZAAR) 50 MG tablet   Menthol-Camphor (ICY HOT ADVANCED PAIN RELIEF) 16-11 % CREA   rivaroxaban (XARELTO) 20 MG TABS tablet   sildenafil (VIAGRA) 100 MG tablet   No current facility-administered medications for this encounter.   Pt to hold xarelto 5 days before surgery   If no changes, I anticipate pt can proceed with surgery as scheduled.   Willeen Cass, PhD, FNP-BC [facility]/Anesthesiology Phone: [phone] 06/07/2021 4:01 PM

## 2021-06-07 NOTE — Patient Instructions (Signed)
DUE TO COVID-19 ONLY ONE VISITOR IS ALLOWED TO COME WITH YOU AND STAY IN THE WAITING ROOM ONLY DURING PRE OP AND PROCEDURE.   **NO VISITORS ARE ALLOWED IN THE SHORT STAY AREA OR RECOVERY ROOM!!**  IF YOU WILL BE ADMITTED INTO THE HOSPITAL YOU ARE ALLOWED ONLY TWO SUPPORT PEOPLE DURING VISITATION HOURS ONLY (10AM -8PM)   The support person(s) may change daily. The support person(s) must pass our screening, gel in and out, and wear a mask at all times, including in the patient's room. Patients must also wear a mask when staff or their support person are in the room.  No visitors under the age of 41. Any visitor under the age of 32 must be accompanied by an adult.     Your procedure is scheduled on: 06/20/21   Report to [facility] Main Entrance    Report to admitting at : 7:15 AM   Call this number if you have problems the morning of surgery [phone]   Do not eat food :After Midnight.   May have liquids until: 6:30 AM    day of surgery  CLEAR LIQUID DIET  Foods Allowed                                                                     Foods Excluded  Water, Black Coffee and tea, regular and decaf                             liquids that you cannot  Plain Jell-O in any flavor  (No red)                                           see through such as: Fruit ices (not with fruit pulp)                                     milk, soups, orange juice              Iced Popsicles (No red)                                    All solid food                                   Apple juices Sports drinks like Gatorade (No red) Lightly seasoned clear broth or consume(fat free) Sugar,   Sample Menu Breakfast                                Lunch                                     Supper Cranberry juice  Beef broth                            Chicken broth Jell-O                                     Grape juice                           Apple juice Coffee or tea                         Jell-O                                      Popsicle                                                Coffee or tea                        Coffee or tea     Oral Hygiene is also important to reduce your risk of infection.                                    Remember - BRUSH YOUR TEETH THE MORNING OF SURGERY WITH YOUR REGULAR TOOTHPASTE   Do NOT smoke after Midnight   Take these medicines the morning of surgery with A SIP OF WATER: carvedilol.Use eye drops as usual.   DO NOT TAKE JARDIANCE THE DAY BEFORE SURGERY.  DO NOT TAKE ANY ORAL DIABETIC MEDICATIONS DAY OF YOUR SURGERY                              You may not have any metal on your body including hair pins, jewelry, and body piercing             Do not wear lotions, powders, perfumes/cologne, or deodorant              Men may shave face and neck.   Do not bring valuables to the hospital. [place].   Contacts, dentures or bridgework may not be worn into surgery.   Bring small overnight bag day of surgery.    Patients discharged on the day of surgery will not be allowed to drive home.   Special Instructions: Bring a copy of your healthcare power of attorney and living will documents         the day of surgery if you haven't scanned them before.              Please read over the following fact sheets you were given: IF YOU HAVE QUESTIONS ABOUT YOUR PRE-OP INSTRUCTIONS PLEASE CALL [phone]   [facility] - Preparing for Surgery Before surgery, you can play an important role.  Because skin is not sterile, your skin needs to be as  free of germs as possible.  You can reduce the number of germs on your skin by washing with CHG (chlorahexidine gluconate) soap before surgery.  CHG is an antiseptic cleaner which kills germs and bonds with the skin to continue killing germs even after washing. Please DO NOT use if you have an allergy to CHG or antibacterial soaps.  If your  skin becomes reddened/irritated stop using the CHG and inform your nurse when you arrive at Short Stay. Do not shave (including legs and underarms) for at least 48 hours prior to the first CHG shower.  You may shave your face/neck. Please follow these instructions carefully:  1.  Shower with CHG Soap the night before surgery and the  morning of Surgery.  2.  If you choose to wash your hair, wash your hair first as usual with your  normal  shampoo.  3.  After you shampoo, rinse your hair and body thoroughly to remove the  shampoo.                           4.  Use CHG as you would any other liquid soap.  You can apply chg directly  to the skin and wash                       Gently with a scrungie or clean washcloth.  5.  Apply the CHG Soap to your body ONLY FROM THE NECK DOWN.   Do not use on face/ open                           Wound or open sores. Avoid contact with eyes, ears mouth and genitals (private parts).                       Wash face,  Genitals (private parts) with your normal soap.             6.  Wash thoroughly, paying special attention to the area where your surgery  will be performed.  7.  Thoroughly rinse your body with warm water from the neck down.  8.  DO NOT shower/wash with your normal soap after using and rinsing off  the CHG Soap.                9.  Pat yourself dry with a clean towel.            10.  Wear clean pajamas.            11.  Place clean sheets on your bed the night of your first shower and do not  sleep with pets. Day of Surgery : Do not apply any lotions/deodorants the morning of surgery.  Please wear clean clothes to the hospital/surgery center.  FAILURE TO FOLLOW THESE INSTRUCTIONS MAY RESULT IN THE CANCELLATION OF YOUR SURGERY PATIENT SIGNATURE_________________________________  NURSE SIGNATURE__________________________________  ________________________________________________________________________

## 2021-06-07 NOTE — Progress Notes (Signed)
Pt. Refused blood products. 

## 2021-06-07 NOTE — Progress Notes (Signed)
COVID Vaccine Completed: Yes Date COVID Vaccine completed: 12/12/20 x 4 COVID vaccine manufacturer:  Moderna     PCP - Dr. Mack Hook Cardiologist - Dr. Glori Bickers . LOV: 04/11/21  Chest x-ray -  EKG - 04/11/21 Stress Test -  ECHO - 04/11/21 Cardiac Cath -  Pacemaker/ICD device last checked: 09/06/20  Sleep Study -  CPAP -   Fasting Blood Sugar - 100's Checks Blood Sugar ___3__ times a week  Blood Thinner Instructions: Xarelto will be held 5 days before surgery as per Dr. Haroldine Laws instructions. Aspirin Instructions: Last Dose:  Anesthesia review: Hx: Afib,CAD,Lt. BBB,CKD III,DIA,Pacemaker.  Patient denies shortness of breath, fever, cough and chest pain at PAT appointment   Patient verbalized understanding of instructions that were given to them at the PAT appointment. Patient was also instructed that they will need to review over the PAT instructions again at home before surgery.

## 2021-06-07 NOTE — Anesthesia Preprocedure Evaluation (Addendum)
Anesthesia Evaluation  Patient identified by MRN, date of birth, ID band Patient awake    Reviewed: Allergy & Precautions, NPO status , Patient's Chart, lab work & pertinent test results, reviewed documented beta blocker date and time   Airway Mallampati: II  TM Distance: >3 FB Neck ROM: Full    Dental  (+) Chipped, Dental Advisory Given,    Pulmonary neg pulmonary ROS,    Pulmonary exam normal breath sounds clear to auscultation       Cardiovascular hypertension, Pt. on medications and Pt. on home beta blockers + CAD and +CHF  + dysrhythmias + pacemaker  Rhythm:Irregular Rate:Normal  Echo 03/2021 1. Left ventricular ejection fraction, by estimation, is 55 to 60%. The left ventricle has normal function. The left ventricle has no regional wall motion abnormalities. The left ventricular internal cavity size was mildly dilated. Left ventricular diastolic function could not be evaluated.  2. Right ventricular systolic function is normal. The right ventricular size is normal. There is normal pulmonary artery systolic pressure.  3. Left atrial size was severely dilated.  4. Right atrial size was severely dilated.  5. The mitral valve is normal in structure. Mild mitral valve  regurgitation. No evidence of mitral stenosis.  6. The aortic valve is normal in structure. There is mild calcification of the aortic valve. Aortic valve regurgitation is mild. No aortic stenosis is present.  7. The inferior vena cava is normal in size with greater than 50% respiratory variability, suggesting right atrial pressure of 3 mmHg.    Neuro/Psych negative neurological ROS     GI/Hepatic negative GI ROS, Neg liver ROS,   Endo/Other  negative endocrine ROSdiabetes  Renal/GU Renal disease     Musculoskeletal  (+) Arthritis ,   Abdominal   Peds  Hematology  (+) Blood dyscrasia, anemia ,   Anesthesia Other Findings    Reproductive/Obstetrics                                                          Anesthesia Evaluation  Patient identified by MRN, date of birth, ID band Patient awake    Reviewed: Allergy & Precautions, NPO status , Patient's Chart, lab work & pertinent test results  Airway Mallampati: I  TM Distance: >3 FB Neck ROM: Full    Dental   Pulmonary    Pulmonary exam normal        Cardiovascular hypertension, + CAD  Normal cardiovascular exam+ dysrhythmias Atrial Fibrillation + pacemaker      Neuro/Psych    GI/Hepatic   Endo/Other  diabetes, Type 2, Oral Hypoglycemic Agents  Renal/GU      Musculoskeletal   Abdominal   Peds  Hematology   Anesthesia Other Findings   Reproductive/Obstetrics                             Anesthesia Physical Anesthesia Plan  ASA: III  Anesthesia Plan: MAC   Post-op Pain Management:    Induction: Intravenous  PONV Risk Score and Plan:   Airway Management Planned: Nasal Cannula  Additional Equipment:   Intra-op Plan:   Post-operative Plan:   Informed Consent: I have reviewed the patients History and Physical, chart, labs and discussed the procedure including the risks, benefits and alternatives for the proposed  anesthesia with the patient or authorized representative who has indicated his/her understanding and acceptance.       Plan Discussed with: CRNA and Surgeon  Anesthesia Plan Comments:         Anesthesia Quick Evaluation  Anesthesia Physical Anesthesia Plan  ASA: 3  Anesthesia Plan: General   Post-op Pain Management: GA combined w/ Regional for post-op pain   Induction: Intravenous  PONV Risk Score and Plan: 4 or greater and Ondansetron, Treatment may vary due to age or medical condition, Dexamethasone, Midazolam and Diphenhydramine  Airway Management Planned: Oral ETT  Additional Equipment: None  Intra-op Plan:   Post-operative Plan:  Extubation in OR  Informed Consent: I have reviewed the patients History and Physical, chart, labs and discussed the procedure including the risks, benefits and alternatives for the proposed anesthesia with the patient or authorized representative who has indicated his/her understanding and acceptance.     Dental advisory given  Plan Discussed with: CRNA  Anesthesia Plan Comments: (See APP note by Durel Salts, FNP )      Anesthesia Quick Evaluation

## 2021-06-12 NOTE — Telephone Encounter (Signed)
Confirmed with the PHD that he has received his medication

## 2021-06-12 NOTE — Telephone Encounter (Signed)
Please check to make sure the correct pharmacy received as was just refilled

## 2021-06-20 ENCOUNTER — Ambulatory Visit (HOSPITAL_COMMUNITY): Payer: Self-pay | Admitting: Emergency Medicine

## 2021-06-20 ENCOUNTER — Ambulatory Visit (HOSPITAL_COMMUNITY)
Admission: RE | Admit: 2021-06-20 | Discharge: 2021-06-20 | Disposition: A | Discharge status: Home / Self Care | Payer: Self-pay | Attending: General Surgery | Admitting: General Surgery

## 2021-06-20 ENCOUNTER — Other Ambulatory Visit (HOSPITAL_COMMUNITY): Payer: Self-pay

## 2021-06-20 ENCOUNTER — Encounter (HOSPITAL_COMMUNITY)
Admission: RE | Disposition: A | Discharge status: Home / Self Care | Payer: Self-pay | Source: Home / Self Care | Attending: General Surgery

## 2021-06-20 ENCOUNTER — Ambulatory Visit (HOSPITAL_COMMUNITY): Payer: Self-pay | Admitting: Certified Registered Nurse Anesthetist

## 2021-06-20 ENCOUNTER — Encounter (HOSPITAL_COMMUNITY): Payer: Self-pay | Admitting: General Surgery

## 2021-06-20 DIAGNOSIS — Z7901 Long term (current) use of anticoagulants: Secondary | ICD-10-CM | POA: Insufficient documentation

## 2021-06-20 DIAGNOSIS — E7800 Pure hypercholesterolemia, unspecified: Secondary | ICD-10-CM | POA: Insufficient documentation

## 2021-06-20 DIAGNOSIS — I2510 Atherosclerotic heart disease of native coronary artery without angina pectoris: Secondary | ICD-10-CM | POA: Insufficient documentation

## 2021-06-20 DIAGNOSIS — Z950 Presence of cardiac pacemaker: Secondary | ICD-10-CM | POA: Insufficient documentation

## 2021-06-20 DIAGNOSIS — N183 Chronic kidney disease, stage 3 unspecified: Secondary | ICD-10-CM | POA: Insufficient documentation

## 2021-06-20 DIAGNOSIS — I130 Hypertensive heart and chronic kidney disease with heart failure and stage 1 through stage 4 chronic kidney disease, or unspecified chronic kidney disease: Secondary | ICD-10-CM | POA: Insufficient documentation

## 2021-06-20 DIAGNOSIS — N433 Hydrocele, unspecified: Secondary | ICD-10-CM | POA: Insufficient documentation

## 2021-06-20 DIAGNOSIS — Z86010 Personal history of colonic polyps: Secondary | ICD-10-CM | POA: Insufficient documentation

## 2021-06-20 DIAGNOSIS — E1122 Type 2 diabetes mellitus with diabetic chronic kidney disease: Secondary | ICD-10-CM | POA: Insufficient documentation

## 2021-06-20 DIAGNOSIS — I5022 Chronic systolic (congestive) heart failure: Secondary | ICD-10-CM | POA: Insufficient documentation

## 2021-06-20 DIAGNOSIS — I4819 Other persistent atrial fibrillation: Secondary | ICD-10-CM | POA: Insufficient documentation

## 2021-06-20 DIAGNOSIS — Z01818 Encounter for other preprocedural examination: Secondary | ICD-10-CM

## 2021-06-20 DIAGNOSIS — K4031 Unilateral inguinal hernia, with obstruction, without gangrene, recurrent: Secondary | ICD-10-CM | POA: Insufficient documentation

## 2021-06-20 DIAGNOSIS — I429 Cardiomyopathy, unspecified: Secondary | ICD-10-CM | POA: Insufficient documentation

## 2021-06-20 HISTORY — PX: INGUINAL HERNIA REPAIR: SHX194

## 2021-06-20 LAB — GLUCOSE, CAPILLARY
Glucose-Capillary: 116 mg/dL — ABNORMAL HIGH (ref 70–99)
Glucose-Capillary: 129 mg/dL — ABNORMAL HIGH (ref 70–99)

## 2021-06-20 SURGERY — REPAIR, HERNIA, INGUINAL, INCARCERATED
Anesthesia: General | Site: Inguinal | Laterality: Left

## 2021-06-20 MED ORDER — VANCOMYCIN HCL 500 MG IV SOLR
INTRAVENOUS | Status: DC | PRN
  Administered 2021-06-20: 1000 mg

## 2021-06-20 MED ORDER — DIPHENHYDRAMINE HCL 50 MG/ML IJ SOLN
INTRAMUSCULAR | Status: AC
  Filled 2021-06-20: qty 1

## 2021-06-20 MED ORDER — LIDOCAINE 2% (20 MG/ML) 5 ML SYRINGE
INTRAMUSCULAR | Status: DC | PRN
  Administered 2021-06-20: 100 mg via INTRAVENOUS

## 2021-06-20 MED ORDER — ONDANSETRON HCL 4 MG/2ML IJ SOLN
INTRAMUSCULAR | Status: AC
  Filled 2021-06-20: qty 2

## 2021-06-20 MED ORDER — ROCURONIUM BROMIDE 10 MG/ML (PF) SYRINGE
PREFILLED_SYRINGE | INTRAVENOUS | Status: AC
  Filled 2021-06-20: qty 10

## 2021-06-20 MED ORDER — DEXAMETHASONE SODIUM PHOSPHATE 4 MG/ML IJ SOLN
INTRAMUSCULAR | Status: DC | PRN
  Administered 2021-06-20: 5 mg
  Administered 2021-06-20: 4 mg

## 2021-06-20 MED ORDER — FENTANYL CITRATE PF 50 MCG/ML IJ SOSY
25.0000 ug | PREFILLED_SYRINGE | INTRAMUSCULAR | Status: DC | PRN
  Administered 2021-06-20: 25 ug via INTRAVENOUS

## 2021-06-20 MED ORDER — CLONIDINE HCL (ANALGESIA) 100 MCG/ML EP SOLN
EPIDURAL | Status: DC | PRN
  Administered 2021-06-20: 80 ug

## 2021-06-20 MED ORDER — ONDANSETRON HCL 4 MG/2ML IJ SOLN
INTRAMUSCULAR | Status: DC | PRN
  Administered 2021-06-20: 4 mg via INTRAVENOUS

## 2021-06-20 MED ORDER — RIVAROXABAN 20 MG PO TABS: 20.0000 mg | ORAL_TABLET | Freq: Every day | ORAL | Status: DC

## 2021-06-20 MED ORDER — ENSURE PRE-SURGERY PO LIQD
296.0000 mL | Freq: Once | ORAL | Status: DC
  Filled 2021-06-20: qty 296

## 2021-06-20 MED ORDER — SODIUM CHLORIDE 0.9 % IV SOLN
INTRAVENOUS | Status: DC
  Filled 2021-06-20: qty 6

## 2021-06-20 MED ORDER — CHLORHEXIDINE GLUCONATE 0.12 % MT SOLN
15.0000 mL | Freq: Once | OROMUCOSAL | Status: AC
  Administered 2021-06-20: 15 mL via OROMUCOSAL

## 2021-06-20 MED ORDER — SUGAMMADEX SODIUM 200 MG/2ML IV SOLN
INTRAVENOUS | Status: DC | PRN
Start: 2021-06-20 — End: 2021-06-20
  Administered 2021-06-20: 200 mg via INTRAVENOUS

## 2021-06-20 MED ORDER — MIDAZOLAM HCL 2 MG/2ML IJ SOLN
INTRAMUSCULAR | Status: AC
  Filled 2021-06-20: qty 2

## 2021-06-20 MED ORDER — BUPIVACAINE-EPINEPHRINE (PF) 0.25% -1:200000 IJ SOLN
INTRAMUSCULAR | Status: AC
  Filled 2021-06-20: qty 30

## 2021-06-20 MED ORDER — FENTANYL CITRATE PF 50 MCG/ML IJ SOSY
PREFILLED_SYRINGE | INTRAMUSCULAR | Status: AC
  Administered 2021-06-20: 25 ug via INTRAVENOUS
  Filled 2021-06-20: qty 1

## 2021-06-20 MED ORDER — ACETAMINOPHEN 500 MG PO TABS
1000.0000 mg | ORAL_TABLET | Freq: Three times a day (TID) | ORAL | 0 refills | Status: AC
  Filled 2021-06-20: qty 30, 5d supply, fill #0

## 2021-06-20 MED ORDER — BUPIVACAINE-EPINEPHRINE (PF) 0.25% -1:200000 IJ SOLN
INTRAMUSCULAR | Status: DC | PRN
  Administered 2021-06-20: 7 mL

## 2021-06-20 MED ORDER — CHLORHEXIDINE GLUCONATE CLOTH 2 % EX PADS: 6.0000 | MEDICATED_PAD | Freq: Once | CUTANEOUS | Status: DC

## 2021-06-20 MED ORDER — FENTANYL CITRATE (PF) 250 MCG/5ML IJ SOLN
INTRAMUSCULAR | Status: AC
  Filled 2021-06-20: qty 5

## 2021-06-20 MED ORDER — FENTANYL CITRATE (PF) 100 MCG/2ML IJ SOLN
INTRAMUSCULAR | Status: DC | PRN
  Administered 2021-06-20 (×3): 50 ug via INTRAVENOUS

## 2021-06-20 MED ORDER — LACTATED RINGERS IV SOLN: INTRAVENOUS | Status: DC

## 2021-06-20 MED ORDER — CEFAZOLIN SODIUM-DEXTROSE 2-4 GM/100ML-% IV SOLN
2.0000 g | INTRAVENOUS | Status: AC
  Administered 2021-06-20: 2 g via INTRAVENOUS
  Filled 2021-06-20: qty 100

## 2021-06-20 MED ORDER — ROCURONIUM BROMIDE 10 MG/ML (PF) SYRINGE
PREFILLED_SYRINGE | INTRAVENOUS | Status: DC | PRN
Start: 2021-06-20 — End: 2021-06-20
  Administered 2021-06-20: 30 mg via INTRAVENOUS
  Administered 2021-06-20: 60 mg via INTRAVENOUS

## 2021-06-20 MED ORDER — ROPIVACAINE HCL 5 MG/ML IJ SOLN
INTRAMUSCULAR | Status: DC | PRN
  Administered 2021-06-20: 30 mL via EPIDURAL

## 2021-06-20 MED ORDER — DIPHENHYDRAMINE HCL 50 MG/ML IJ SOLN
INTRAMUSCULAR | Status: DC | PRN
  Administered 2021-06-20: 12.5 mg via INTRAVENOUS

## 2021-06-20 MED ORDER — FENTANYL CITRATE PF 50 MCG/ML IJ SOSY
50.0000 ug | PREFILLED_SYRINGE | INTRAMUSCULAR | Status: DC
  Administered 2021-06-20: 50 ug via INTRAVENOUS
  Filled 2021-06-20: qty 2

## 2021-06-20 MED ORDER — PHENYLEPHRINE 40 MCG/ML (10ML) SYRINGE FOR IV PUSH (FOR BLOOD PRESSURE SUPPORT)
PREFILLED_SYRINGE | INTRAVENOUS | Status: DC | PRN
Start: 2021-06-20 — End: 2021-06-20
  Administered 2021-06-20 (×2): 80 ug via INTRAVENOUS

## 2021-06-20 MED ORDER — MEPERIDINE HCL 50 MG/ML IJ SOLN: 6.2500 mg | INTRAMUSCULAR | Status: DC | PRN

## 2021-06-20 MED ORDER — PROPOFOL 10 MG/ML IV BOLUS
INTRAVENOUS | Status: DC | PRN
  Administered 2021-06-20: 150 mg via INTRAVENOUS

## 2021-06-20 MED ORDER — ACETAMINOPHEN 500 MG PO TABS
1000.0000 mg | ORAL_TABLET | ORAL | Status: AC
  Administered 2021-06-20: 1000 mg via ORAL
  Filled 2021-06-20: qty 2

## 2021-06-20 MED ORDER — VANCOMYCIN HCL 1 G IV SOLR
Freq: Once | INTRAVENOUS | Status: DC
  Filled 2021-06-20: qty 20

## 2021-06-20 MED ORDER — DEXAMETHASONE SODIUM PHOSPHATE 10 MG/ML IJ SOLN
INTRAMUSCULAR | Status: AC
  Filled 2021-06-20: qty 1

## 2021-06-20 MED ORDER — MIDAZOLAM HCL 2 MG/2ML IJ SOLN
1.0000 mg | INTRAMUSCULAR | Status: DC
  Administered 2021-06-20: 1 mg via INTRAVENOUS
  Filled 2021-06-20: qty 2

## 2021-06-20 MED ORDER — FENTANYL CITRATE PF 50 MCG/ML IJ SOSY
PREFILLED_SYRINGE | INTRAMUSCULAR | Status: AC
  Administered 2021-06-20: 50 ug via INTRAVENOUS
  Filled 2021-06-20: qty 1

## 2021-06-20 MED ORDER — PROMETHAZINE HCL 25 MG/ML IJ SOLN: 6.2500 mg | INTRAMUSCULAR | Status: DC | PRN

## 2021-06-20 MED ORDER — SODIUM CHLORIDE 0.9 % IV SOLN
INTRAVENOUS | Status: DC | PRN
  Administered 2021-06-20: 1000 mL

## 2021-06-20 MED ORDER — 0.9 % SODIUM CHLORIDE (POUR BTL) OPTIME
TOPICAL | Status: DC | PRN
  Administered 2021-06-20: 1000 mL

## 2021-06-20 MED ORDER — OXYCODONE HCL 5 MG PO TABS
5.0000 mg | ORAL_TABLET | Freq: Four times a day (QID) | ORAL | 0 refills | Status: DC | PRN
  Filled 2021-06-20: qty 15, 4d supply, fill #0

## 2021-06-20 SURGICAL SUPPLY — 31 items
ADH SKN CLS APL DERMABOND .7 (GAUZE/BANDAGES/DRESSINGS) ×1
APL SKNCLS STERI-STRIP NONHPOA (GAUZE/BANDAGES/DRESSINGS)
BAG COUNTER SPONGE SURGICOUNT (BAG) IMPLANT
BAG SPNG CNTER NS LX DISP (BAG)
BENZOIN TINCTURE PRP APPL 2/3 (GAUZE/BANDAGES/DRESSINGS) IMPLANT
COVER SURGICAL LIGHT HANDLE (MISCELLANEOUS) ×2 IMPLANT
DECANTER SPIKE VIAL GLASS SM (MISCELLANEOUS) ×1 IMPLANT
DERMABOND ADVANCED (GAUZE/BANDAGES/DRESSINGS) ×1
DERMABOND ADVANCED .7 DNX12 (GAUZE/BANDAGES/DRESSINGS) IMPLANT
DRAIN PENROSE 0.5X18 (DRAIN) ×1 IMPLANT
DRAPE LAPAROTOMY T 98X78 PEDS (DRAPES) ×2 IMPLANT
DRSG TEGADERM 4X4.75 (GAUZE/BANDAGES/DRESSINGS) IMPLANT
ELECT REM PT RETURN 15FT ADLT (MISCELLANEOUS) ×2 IMPLANT
GLOVE SRG 8 PF TXTR STRL LF DI (GLOVE) ×1 IMPLANT
GLOVE SURG POLY ORTHO LF SZ7.5 (GLOVE) ×2 IMPLANT
GLOVE SURG UNDER POLY LF SZ8 (GLOVE) ×2
GOWN STRL REUS W/TWL XL LVL3 (GOWN DISPOSABLE) ×4 IMPLANT
KIT BASIN OR (CUSTOM PROCEDURE TRAY) ×2 IMPLANT
KIT TURNOVER KIT A (KITS) IMPLANT
MESH BARD SOFT 3X6IN (Mesh General) ×2 IMPLANT
NEEDLE HYPO 22GX1.5 SAFETY (NEEDLE) ×2 IMPLANT
PACK GENERAL/GYN (CUSTOM PROCEDURE TRAY) ×2 IMPLANT
STRIP CLOSURE SKIN 1/2X4 (GAUZE/BANDAGES/DRESSINGS) ×2 IMPLANT
SUT MNCRL AB 4-0 PS2 18 (SUTURE) ×2 IMPLANT
SUT PROLENE 2 0 CT2 30 (SUTURE) ×4 IMPLANT
SUT VIC AB 2-0 SH 27 (SUTURE) ×2
SUT VIC AB 2-0 SH 27X BRD (SUTURE) ×1 IMPLANT
SUT VIC AB 3-0 SH 18 (SUTURE) ×3 IMPLANT
SYR 20ML LL LF (SYRINGE) ×2 IMPLANT
TOWEL OR 17X26 10 PK STRL BLUE (TOWEL DISPOSABLE) ×2 IMPLANT
TOWEL OR NON WOVEN STRL DISP B (DISPOSABLE) ×2 IMPLANT

## 2021-06-20 NOTE — Anesthesia Postprocedure Evaluation (Signed)
Anesthesia Post Note  Patient: Kyle Burke  Procedure(s) Performed: OPEN REPAIR OF RECURENT LEFT INCARCERATED INGUINAL HERNIA WITH MESH (Left: Inguinal)     Patient location during evaluation: PACU Anesthesia Type: General Level of consciousness: sedated and patient cooperative Pain management: pain level controlled Vital Signs Assessment: post-procedure vital signs reviewed and stable Respiratory status: spontaneous breathing Cardiovascular status: stable Anesthetic complications: no   No notable events documented.  Last Vitals:  Vitals:   06/20/21 1245 06/20/21 1300  BP: (!) 141/80 124/65  Pulse: 70 70  Resp: 12 17  Temp:  36.6 C  SpO2: 98% 98%    Last Pain:  Vitals:   06/20/21 1300  TempSrc:   PainSc: 0-No pain                 Nolon Nations

## 2021-06-20 NOTE — Anesthesia Procedure Notes (Addendum)
Anesthesia Regional Block: TAP block   Pre-Anesthetic Checklist: , timeout performed,  Correct Patient, Correct Site, Correct Laterality,  Correct Procedure, Correct Position, site marked,  Risks and benefits discussed,  Surgical consent,  Pre-op evaluation,  At surgeon's request and post-op pain management  Laterality: Left  Prep: chloraprep       Needles:  Injection technique: Single-shot  Needle Type: Echogenic Stimulator Needle      Needle Gauge: 21     Additional Needles:   Procedures:,,,, ultrasound used (permanent image in chart),,    Narrative:  Start time: 06/20/2021 8:55 AM End time: 06/20/2021 9:16 AM Injection made incrementally with aspirations every 5 mL.  Performed by: Personally  Anesthesiologist: Nolon Nations, MD  Additional Notes: BP cuff, SpO2 and EKG monitors applied. Sedation begun.  Anesthetic injected incrementally, slowly, and after neg aspirations under direct ultrasound guidance. Good fascial spread noted. Patient tolerated well.

## 2021-06-20 NOTE — H&P (Signed)
CC: here for surgery  Requesting provider: n/a Conducted via [place] interpreter HPI: Kyle Burke is an 75 y.o. male who is here for open repair of recurrent chronically incarcerated left inguinal hernia with mesh.  I last saw him in the clinic in mid July.  He denies any medical changes since I last saw him.  He states that he stopped taking his Xarelto 6 days ago.  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  He underwent laparoscopic repair of a large left chronically incarcerated inguinal hernia with mesh along with primary umbilical hernia repair by me in May 2013.  He has a history of colonic polyps and he has unfortunately had a recurrence of his left inguinal hernia. It was found to be technically difficult and impossible to do his colonoscopy because of the recurrent hernia. His PCP sent him here to discuss repair. He denies any left inguinal pain or discomfort. He denies any nausea or vomiting diarrhea or constipation. He states that he can gently push it back in at night or make it smaller. He does have a fairly significant cardiac history but currently denies any chest pain, chest pressure, shortness of breath, dyspnea on exertion, TIAs or amaurosis fugax  He is on chronic Xarelto. He has a h/o chronic systolic heart failure, LBBB, chronic atrial fib, and is s/p Biv PPM insertion. He received a His bundle lead in the atrial port, an LV and RV leads and his EF has improved from 15% to 25%.  Patient is a Restaurant manager, fast food and does confirm that he refuses blood products  Past Medical History:  Diagnosis Date   Arthritis    "knees" (07/13/2018)   Atrial fibrillation ([place]) 2016   CAD (coronary artery disease)    Dysrhythmia    High cholesterol    Hypertension 2006   Idiopathic cardiomyopathy ([place]) 11/01/2014   Followed by Dr. Acie Fredrickson, Cardiology  EF 25%  Adenosine Cardiolite did not support ischemic etiology   OA (osteoarthritis) of knee 01/08/2017    Pleural effusion    Presence of permanent cardiac pacemaker 07/13/2018   Refusal of blood transfusions as patient is Jehovah's Witness    Type II diabetes mellitus ([place]) 2001    Past Surgical History:  Procedure Laterality Date   BI-VENTRICULAR PACEMAKER INSERTION (CRT-P)  07/13/2018   BIV PACEMAKER INSERTION CRT-P N/A 07/13/2018   Procedure: BIV PACEMAKER INSERTION CRT-P;  Surgeon: Evans Lance, MD;  Location: [facility];  Service: Cardiovascular;  Laterality: N/A;   COLONOSCOPY WITH PROPOFOL N/A 05/14/2016   Procedure: COLONOSCOPY WITH PROPOFOL;  Surgeon: Jerene Bears, MD;  Location: [facility];  Service: Gastroenterology;  Laterality: N/A;   COLONOSCOPY WITH PROPOFOL N/A 07/09/2020   Procedure: COLONOSCOPY WITH PROPOFOL;  Surgeon: Jerene Bears, MD;  Location: [facility];  Service: Gastroenterology;  Laterality: N/A;   ESOPHAGOGASTRODUODENOSCOPY (EGD) WITH PROPOFOL N/A 05/14/2016   Procedure: ESOPHAGOGASTRODUODENOSCOPY (EGD) WITH PROPOFOL;  Surgeon: Jerene Bears, MD;  Location: [facility];  Service: Gastroenterology;  Laterality: N/A;   HEMOSTASIS CLIP PLACEMENT  07/09/2020   Procedure: HEMOSTASIS CLIP PLACEMENT;  Surgeon: Jerene Bears, MD;  Location: [facility];  Service: Gastroenterology;;   INCISE AND DRAIN ABCESS Left 1986   on leg   INGUINAL HERNIA REPAIR  01/16/2012   Procedure: LAPAROSCOPIC INGUINAL HERNIA;  Surgeon: Gayland Curry, MD,FACS;  Location: [facility];  Service: General;  Laterality: Left;  Laparoscopic incarcerated Left inguinal hernia repair with mesh, umbilical hernia repair  POLYPECTOMY  07/09/2020   Procedure: POLYPECTOMY;  Surgeon: Jerene Bears, MD;  Location: [facility];  Service: Gastroenterology;;  hot snare   UMBILICAL HERNIA REPAIR  01/16/2012   Procedure: HERNIA REPAIR UMBILICAL ADULT;  Surgeon: Gayland Curry, MD,FACS;  Location: [facility];  Service: General;  Laterality: N/A;    Family History  Problem Relation Age of Onset   Stroke Mother     Heart disease Mother    Heart disease Father    Diabetes Father    Kidney disease Father        , history of stones as well   Cancer Sister 34       unknown soft tissue cancer of leg   Depression Sister    Varicose Veins Son    Diabetes Brother     Social:  reports that he has never smoked. He has never used smokeless tobacco. He reports that he does not currently use alcohol. He reports that he does not use drugs.  Allergies:  Allergies  Allergen Reactions   Spironolactone Other (See Comments)    Gynecomastia   Other     REFUSE BLOOD PRODUCTS    Medications: I have reviewed the patient's current medications.   ROS - all of the below systems have been reviewed with the patient and positives are indicated with bold text General: chills, fever or night sweats Eyes: blurry vision or double vision ENT: epistaxis or sore throat Allergy/Immunology: itchy/watery eyes or nasal congestion Hematologic/Lymphatic: bleeding problems, blood clots or swollen lymph nodes Endocrine: temperature intolerance or unexpected weight changes Breast: new or changing breast lumps or nipple discharge Resp: cough, shortness of breath, or wheezing CV: chest pain or dyspnea on exertion GI: as per HPI GU: dysuria, trouble voiding, or hematuria MSK: joint pain or joint stiffness Neuro: TIA or stroke symptoms Derm: pruritus and skin lesion changes Psych: anxiety and depression  PE Blood pressure (!) 141/83, pulse 73, temperature 98.3 F (36.8 C), temperature source Oral, resp. rate 18, SpO2 99 %. Constitutional: NAD; conversant; no deformities Eyes: Moist conjunctiva; no lid lag; anicteric; PERRL Neck: Trachea midline; no thyromegaly Lungs: Normal respiratory effort; no tactile fremitus CV: RRR; no palpable thrills; no pitting edema GI: Abd old trocar scars, L groin bulge; no palpable hepatosplenomegaly MSK: Normal gait; no clubbing/cyanosis Psychiatric: Appropriate affect; alert and oriented  x3 Lymphatic: No palpable cervical or axillary lymphadenopathy Skin:no rash/lesions  Results for orders placed or performed during the hospital encounter of 06/20/21 (from the past 48 hour(s))  Glucose, capillary     Status: Abnormal   Collection Time: 06/20/21  7:58 AM  Result Value Ref Range   Glucose-Capillary 116 (H) 70 - 99 mg/dL    Comment: Glucose reference range applies only to samples taken after fasting for at least 8 hours.    No results found.  Imaging: N/a  A/P: Kyle Burke is an 75 y.o. male with  Unilateral recurrent inguinal hernia without obstruction or gangrene -  Chronic anticoagulation  Essential hypertension  Persistent atrial fibrillation (CMS-HCC)  Controlled type 2 diabetes mellitus with stage 3 chronic kidney disease, without long-term current use of insulin (CMS-HCC)  Chronic systolic (congestive) heart failure (CMS-HCC)  2 OR for open repair of recurrent left inguinal hernia which is incarcerated with mesh Patient stopped taking his Xarelto 6 days ago. He received preoperative clearance by the congestive heart failure team. Preoperative tap block IV antibiotic Enhanced recovery protocol  All questions asked and answered  Leighton Ruff. Redmond Pulling,  MD, FACS General, Bariatric, & Minimally Invasive Surgery [facility], [place]

## 2021-06-20 NOTE — Discharge Instructions (Addendum)
Resume xarelto on 06/22/2021 [facility]  UMBILICAL OR INGUINAL HERNIA REPAIR: POST OP INSTRUCTIONS  Always review your discharge instruction sheet given to you by the facility where your surgery was performed. IF YOU HAVE DISABILITY OR FAMILY LEAVE FORMS, YOU MUST BRING THEM TO THE OFFICE FOR PROCESSING.   DO NOT GIVE THEM TO YOUR DOCTOR.  A  prescription for pain medication may be given to you upon discharge.  Take your pain medication as prescribed, if needed.  If narcotic pain medicine is not needed, then you may take acetaminophen (Tylenol) or ibuprofen (Advil) as needed. Take your usually prescribed medications unless otherwise directed. If you need a refill on your pain medication, please contact your pharmacy.  They will contact our office to request authorization. Prescriptions will not be filled after 5 pm or on week-ends. You should follow a light diet the first 24 hours after arrival home, such as soup and crackers, etc.  Be sure to include lots of fluids daily.  Resume your normal diet the day after surgery. Most patients will experience some swelling and bruising around the umbilicus or in the groin and scrotum.  Ice packs and reclining will help.  Swelling and bruising can take several days to resolve.  It is common to experience some constipation if taking pain medication after surgery.  Increasing fluid intake and taking a stool softener (such as Colace) will usually help or prevent this problem from occurring.  A mild laxative (Milk of Magnesia or Miralax) should be taken according to package directions if there are no bowel movements after 48 hours. Unless discharge instructions indicate otherwise, you may remove your bandages 24-48 hours after surgery, and you may shower at that time.  You may have steri-strips (small skin tapes) in place directly over the incision.  These strips should be left on the skin for 7-10 days.  If your surgeon used skin glue on the  incision, you may shower in 24 hours.  The glue will flake off over the next 2-3 weeks.  Any sutures or staples will be removed at the office during your follow-up visit. ACTIVITIES:  You may resume regular (light) daily activities beginning the next day--such as daily self-care, walking, climbing stairs--gradually increasing activities as tolerated.  You may have sexual intercourse when it is comfortable.  Refrain from any heavy lifting or straining until approved by your doctor. You may drive when you are no longer taking prescription pain medication, you can comfortably wear a seatbelt, and you can safely maneuver your car and apply brakes. RETURN TO WORK:  You should see your doctor in the office for a follow-up appointment approximately 2-3 weeks after your surgery.  Make sure that you call for this appointment within a day or two after you arrive home to insure a convenient appointment time. OTHER INSTRUCTIONS:     WHEN TO CALL YOUR DOCTOR: Fever over 101.0 Inability to urinate Nausea and/or vomiting Extreme swelling or bruising Continued bleeding from incision. Increased pain, redness, or drainage from the incision  The clinic staff is available to answer your questions during regular business hours.  Please don't hesitate to call and ask to speak to one of the nurses for clinical concerns.  If you have a medical emergency, go to the nearest emergency room or call 911.  A surgeon from [facility] is always on call at the hospital   [address], [place], [place], [place]  [address] ?  [address] [place], [place]   [address] [phone] ? [phone] ? FAX [phone] Web site: [email or web address]

## 2021-06-20 NOTE — Anesthesia Procedure Notes (Signed)
Procedure Name: Intubation Date/Time: 06/20/2021 9:50 AM Performed by: Claudia Desanctis, CRNA Pre-anesthesia Checklist: Patient identified, Emergency Drugs available, Suction available and Patient being monitored Patient Re-evaluated:Patient Re-evaluated prior to induction Oxygen Delivery Method: Circle system utilized Preoxygenation: Pre-oxygenation with 100% oxygen Induction Type: IV induction Ventilation: Mask ventilation without difficulty Laryngoscope Size: 2 and Miller Grade View: Grade I Tube type: Oral Tube size: 7.5 mm Number of attempts: 1 Airway Equipment and Method: Stylet Placement Confirmation: ETT inserted through vocal cords under direct vision, positive ETCO2 and breath sounds checked- equal and bilateral Secured at: 21 cm Tube secured with: Tape Dental Injury: Teeth and Oropharynx as per pre-operative assessment

## 2021-06-20 NOTE — Op Note (Signed)
Open Repair of Left Recurrent Incarcerated Pantaloon Inguinal Hernia Repair with Mesh; Jaboulay tunica plication Procedure Note  Indications: The patient presented with a history of a left, not reducible hernia.  Patient had undergone a laparoscopic repair of a left incarcerated indirect inguinal hernia with mesh back in 2013.  He presented to the clinic a few months ago complaining of recurrent bulge.  Pre-operative Diagnosis: left not reducible -incarcerated & recurrent inguinal hernia  Post-operative Diagnosis: Left recurrent incarcerated direct and indirect inguinal hernia; left hydrocele  Surgeon: Greer Pickerel MD FACS  Assistants: Annye English MD FACS (an assistant was asked to join me in the operating room due to the complexity of the scarring in the inguinal canal and scrotum and assistance in identifying structures)  Anesthesia: General endotracheal anesthesia and tap block   Surgeon: Leighton Ruff. Redmond Pulling, MD, FACS  Procedure Details  The patient was seen again in the Holding Room. The risks, benefits, complications, treatment options, and expected outcomes were discussed with the patient. The possibilities of reaction to medication, pulmonary aspiration, perforation of viscus, bleeding, recurrent infection, the need for additional procedures, and development of a complication requiring transfusion or further operation were discussed with the patient and/or family. The likelihood of success in repairing the hernia and returning the patient to their previous functional status is good.  There was concurrence with the proposed plan, and informed consent was obtained. The site of surgery was properly noted/marked. The patient was taken to the Operating Room, identified as Kyle Burke, and the procedure verified as left inguinal hernia repair. A Time Out was held and the above information confirmed.  He received a tap block preoperatively by anesthesia for postoperative pain relief.  The  patient had verified that he had stopped his blood thinner preoperatively.  The patient was placed in the supine position and underwent induction of anesthesia. The lower abdomen and groin was prepped with Chloraprep and draped in the standard fashion, and 0.25% Marcaine with epinephrine was used to anesthetize the skin over the mid-portion of the inguinal canal. An oblique incision was made. Dissection was carried down through the subcutaneous tissue with cautery to the external oblique fascia.  We opened the external oblique fascia along the direction of its fibers to the external ring.  The spermatic cord was circumferentially dissected bluntly and retracted with a Penrose drain.  The floor of the inguinal canal was inspected there was a small defect with a plug of fat tissue herniating through it.  There was a large indirect inguinal hernia component as well with incarcerated sigmoid colon.  The hernia sac was fused to the cord structures and the testicle.  I ended up delivering the testicle up out of the scrotum.  The cord structures were again intimately fused with the hernia sac.  I was able to reduce the sigmoid colon but the hernia sac was still quite intimately associated and fused with the cord contents.  Dr. Dema Severin joined me in the operating room.  It was a little bit challenging to delineate hernia sac from cord contents.  We eventually identified the spermatic vessels.  The vas deferens was intimately fused with the hernia sac.  The distal end of the hernia sac was fused to the testicle and tunica.  There was a hydrocele which ended up being open.  I ended up opening the hernia sac and confirming that it went all the way back into the abdominal cavity.  I could feel the mesh from the prior repair.  The testicular vessels were fairly easily separated from the hernia sac.  I ended up ligating the hernia sac after separating it from the vas deferens.  It was closed with a running 3-0 Vicryl suture.   Because I had delivered the testicle about the scrotum and had open the tunica I asked Dr. Alinda Money with urology to stick his head in the OR.  He stated that we could just placed the testicle back in the scrotum and that we could invaginate the tunica onto itself and do a jaboulay tunica plication to prevent recurrence of the hydrocele.  We ended up opening the tunica a little bit more.  I then placed 3-0 Vicryl sutures on each side and plicated it to itself.  The testicle and cord structures were then tucked back down into the scrotum.  Using a Doppler we confirmed blood flow to the testicle was still intact.  There had been some bleeding along the spermatic vessels which was taking care of with several 3-0 Vicryl sutures.  There is still biphasic blood flow to the testicle.  I reduce the direct herniated fat.  I then plicated the inguinal floor with 3 interrupted 2-0 Prolene sutures..  We used a 3 x 6 inch piece of Bard soft mesh, which was cut into a keyhole shape.  This was secured with 2-0 Prolene, beginning at the pubic tubercle, running this along the shelving edge inferiorly. Superiorly, the mesh was secured to the internal oblique fascia with interrupted 2-0 Prolene sutures.  The tails of the mesh were sutured together behind the spermatic cord.  The mesh was tucked underneath the external oblique fascia laterally.  I irrigated the wound with antibiotics irrigation.  the external oblique fascia was reapproximated with 2-0 Vicryl.  3-0 Vicryl was used to close the subcutaneous tissues and 4-0 Monocryl was used to close the skin in subcuticular fashion.  Dermabond was used to seal the incision.  A clean dressing was applied.  The patient was then extubated and brought to the recovery room in stable condition.  All sponge, instrument, and needle counts were correct prior to closure and at the conclusion of the case.   Estimated Blood Loss: less than 50 mL                 Complications: None; patient  tolerated the procedure well.         Disposition: PACU - hemodynamically stable.         Condition: stable  Leighton Ruff. Redmond Pulling, MD, FACS General, Bariatric, & Minimally Invasive Surgery [facility], [place]

## 2021-06-20 NOTE — Transfer of Care (Signed)
Immediate Anesthesia Transfer of Care Note  Patient: Kyle Burke  Procedure(s) Performed: OPEN REPAIR OF RECURENT LEFT INCARCERATED INGUINAL HERNIA WITH MESH (Left: Inguinal)  Patient Location: PACU  Anesthesia Type:General  Level of Consciousness: awake, alert , oriented and patient cooperative  Airway & Oxygen Therapy: Patient Spontanous Breathing and Patient connected to face mask  Post-op Assessment: Report given to RN and Post -op Vital signs reviewed and stable  Post vital signs: Reviewed and stable  Last Vitals:  Vitals Value Taken Time  BP 120/76 06/20/21 1157  Temp    Pulse 72 06/20/21 1159  Resp 13 06/20/21 1159  SpO2 99 % 06/20/21 1159  Vitals shown include unvalidated device data.  Last Pain:  Vitals:   06/20/21 0809  TempSrc:   PainSc: 0-No pain         Complications: No notable events documented.

## 2021-06-20 NOTE — Progress Notes (Signed)
AssistedDr. Lissa Hoard with left, ultrasound guided, transabdominal plane block. Side rails up, monitors on throughout procedure. See vital signs in flow sheet. Tolerated Procedure well.

## 2021-06-21 ENCOUNTER — Encounter (HOSPITAL_COMMUNITY): Payer: Self-pay | Admitting: General Surgery

## 2021-06-24 ENCOUNTER — Other Ambulatory Visit: Payer: Self-pay

## 2021-06-24 ENCOUNTER — Other Ambulatory Visit: Payer: Self-pay | Admitting: Internal Medicine

## 2021-06-24 DIAGNOSIS — N183 Chronic kidney disease, stage 3 unspecified: Secondary | ICD-10-CM

## 2021-06-24 DIAGNOSIS — Z79899 Other long term (current) drug therapy: Secondary | ICD-10-CM

## 2021-06-24 DIAGNOSIS — E1122 Type 2 diabetes mellitus with diabetic chronic kidney disease: Secondary | ICD-10-CM

## 2021-06-25 LAB — COMPREHENSIVE METABOLIC PANEL
ALT: 14 IU/L (ref 0–44)
AST: 18 IU/L (ref 0–40)
Albumin/Globulin Ratio: 2.1 (ref 1.2–2.2)
Albumin: 4.5 g/dL (ref 3.7–4.7)
Alkaline Phosphatase: 88 IU/L (ref 44–121)
BUN/Creatinine Ratio: 18 (ref 10–24)
BUN: 22 mg/dL (ref 8–27)
Bilirubin Total: 1.9 mg/dL — ABNORMAL HIGH (ref 0.0–1.2)
CO2: 28 mmol/L (ref 20–29)
Calcium: 9.1 mg/dL (ref 8.6–10.2)
Chloride: 95 mmol/L — ABNORMAL LOW (ref 96–106)
Creatinine, Ser: 1.24 mg/dL (ref 0.76–1.27)
Globulin, Total: 2.1 g/dL (ref 1.5–4.5)
Glucose: 134 mg/dL — ABNORMAL HIGH (ref 70–99)
Potassium: 4.5 mmol/L (ref 3.5–5.2)
Sodium: 135 mmol/L (ref 134–144)
Total Protein: 6.6 g/dL (ref 6.0–8.5)
eGFR: 61 mL/min/{1.73_m2} (ref >59)

## 2021-06-25 LAB — HEMOGLOBIN A1C
Est. average glucose Bld gHb Est-mCnc: 137 mg/dL
Hgb A1c MFr Bld: 6.4 % — ABNORMAL HIGH (ref 4.8–5.6)

## 2021-06-27 ENCOUNTER — Ambulatory Visit: Payer: Self-pay | Admitting: Internal Medicine

## 2021-06-27 ENCOUNTER — Encounter: Payer: Self-pay | Admitting: Internal Medicine

## 2021-06-27 ENCOUNTER — Other Ambulatory Visit: Payer: Self-pay

## 2021-06-27 VITALS — BP 100/58 | HR 88 | Resp 16 | Ht 75.0 in | Wt 245.0 lb

## 2021-06-27 DIAGNOSIS — E785 Hyperlipidemia, unspecified: Secondary | ICD-10-CM

## 2021-06-27 DIAGNOSIS — N183 Chronic kidney disease, stage 3 unspecified: Secondary | ICD-10-CM

## 2021-06-27 DIAGNOSIS — K4090 Unilateral inguinal hernia, without obstruction or gangrene, not specified as recurrent: Secondary | ICD-10-CM

## 2021-06-27 DIAGNOSIS — I10 Essential (primary) hypertension: Secondary | ICD-10-CM

## 2021-06-27 DIAGNOSIS — E1122 Type 2 diabetes mellitus with diabetic chronic kidney disease: Secondary | ICD-10-CM

## 2021-06-27 MED ORDER — EMPAGLIFLOZIN 10 MG PO TABS: 10.0000 mg | ORAL_TABLET | Freq: Every day | ORAL | 11 refills | Status: DC

## 2021-06-27 MED ORDER — LOSARTAN POTASSIUM 50 MG PO TABS: 50.0000 mg | ORAL_TABLET | Freq: Every day | ORAL | 11 refills | Status: DC

## 2021-06-27 NOTE — Progress Notes (Signed)
Subjective:    Patient ID: Kyle Burke, male   DOB: February 28, 1946, 75 y.o.   MRN: [id number]   HPI   DM:  Recent A1C at goal at 6.4%.  He is planning to go to [place] tody to get set back up on Jardiance.  Has enough for end of month. Did have diabetic eye check this year.  2.  Elevated creatinine:  now in normal range   3.  Recent recurrent left inguinal hernia repair:  Significant hernia with need to separate different tissues.  He apparently has noted some bruising and wonders what to do about it--if there is a topical ointment to decrease the discoloration.      4.  Cardiac:  No chest pain, dyspnea.  Feels his energy is pretty good even after his surgery on 06/20/21  Current Meds  Medication Sig   acetaminophen (TYLENOL) 500 MG tablet Take 500 mg by mouth every 8 (eight) hours as needed.   AgaMatrix Ultra-Thin Lancets MISC Check blood glucose twice daily before meals   atorvastatin (LIPITOR) 80 MG tablet TAKE 1 TABLET (80MG  TOTAL) BY MOUTH ONCE A DAY   Blood Glucose Monitoring Suppl (AGAMATRIX PRESTO PRO METER) DEVI Twice daily blood glucose checks before meals   carvedilol (COREG) 6.25 MG tablet TAKE 1 AND 1/2 TABLETS (9.375MG ) BY MOUTH 2 TIMES A DAY WITH A MEAL   empagliflozin (JARDIANCE) 10 MG TABS tablet Take 10 mg by mouth daily before breakfast.   ferrous gluconate (FERGON) 324 MG tablet Take 1 tablet (324 mg total) by mouth daily with breakfast.   furosemide (LASIX) 40 MG tablet TAKE 1.5 TABLETS BY MOUTH EVERY MORNING AND 1 TABLET EVERY EVENING   glucose blood (AGAMATRIX PRESTO TEST) test strip Twice daily glucose checks before meals   Glycerin-Hypromellose-PEG 400 0.2-0.2-1 % SOLN Place 2 drops into both eyes daily as needed (redness releif).   losartan (COZAAR) 50 MG tablet Take 1 tablet (50 mg total) by mouth at bedtime.   Menthol-Camphor (ICY HOT ADVANCED PAIN RELIEF) 16-11 % CREA Apply 1 application topically daily as needed (Knee pain).   rivaroxaban  (XARELTO) 20 MG TABS tablet Take 1 tablet (20 mg total) by mouth at bedtime.   sildenafil (VIAGRA) 100 MG tablet TAKE ONE-HALF TO ONE TABLET BY MOUTH ONE-HALF HOUR BEFORE ACTIVITY. ONLY ONE TABLET MAX IN 24 HOURS   Allergies  Allergen Reactions   Spironolactone Other (See Comments)    Gynecomastia   Other     REFUSE BLOOD PRODUCTS     Review of Systems    Objective:   BP (!) 100/58 (BP Location: Left Arm, Patient Position: Sitting, Cuff Size: Normal)   Pulse 88   Resp 16   Ht 6\' 3"  (1.905 m)   Wt 245 lb (111.1 kg)   BMI 30.62 kg/m   Physical Exam NAD HEENT:  PERRL, EOMI Neck:  Supple, No adenopathy Chest:  CTA CV:  RRR with normal S1 and S2, No S3, S4 or murmur.  Radial and DP pulses normal and equal LE:  trace edema.   Assessment & Plan    DM:  at goal with control  Jardiance renewed.  Asked him again to go through [employer] to obtain pharmaceutical company cost reduction--they have the ability to keep up with the paperwork.    2.  Elevated Creatinine:  normalized  3.  Left Inguinal hernia repair:  after reviewing op report, discussed he had to have different tissues in hernial sac separated, so  not surprised about the bruising.  Encouraged cold packs to area.  4.  Cardiomyopathy/Afib with biventricular pacemaker:  doing quite well.     5.  HM:  wants to wait on COVID bivalent and shingles vaccine as wants to heal from surgery and pain first.

## 2021-07-10 ENCOUNTER — Ambulatory Visit: Payer: Self-pay

## 2021-07-10 DIAGNOSIS — I447 Left bundle-branch block, unspecified: Secondary | ICD-10-CM

## 2021-07-10 LAB — CUP PACEART REMOTE DEVICE CHECK
Battery Remaining Longevity: 34 mo
Battery Remaining Percentage: 45 %
Battery Voltage: 2.96 V
Brady Statistic AP VP Percent: 93 %
Brady Statistic AP VS Percent: 0 %
Brady Statistic AS VP Percent: 1 %
Brady Statistic AS VS Percent: 1 %
Brady Statistic RA Percent Paced: 88 %
Date Time Interrogation Session: 20221123020008
Implantable Lead Implant Date: 20191126
Implantable Lead Implant Date: 20191126
Implantable Lead Implant Date: 20191126
Implantable Lead Location: 753858
Implantable Lead Location: 753860
Implantable Lead Location: 753860
Implantable Lead Model: 3830
Implantable Pulse Generator Implant Date: 20191126
Lead Channel Impedance Value: 460 Ohm
Lead Channel Impedance Value: 530 Ohm
Lead Channel Impedance Value: 660 Ohm
Lead Channel Pacing Threshold Amplitude: 0.5 V
Lead Channel Pacing Threshold Amplitude: 0.625 V
Lead Channel Pacing Threshold Amplitude: 1 V
Lead Channel Pacing Threshold Pulse Width: 0.5 ms
Lead Channel Pacing Threshold Pulse Width: 0.5 ms
Lead Channel Pacing Threshold Pulse Width: 1 ms
Lead Channel Sensing Intrinsic Amplitude: 1 mV
Lead Channel Sensing Intrinsic Amplitude: 10.1 mV
Lead Channel Setting Pacing Amplitude: 2 V
Lead Channel Setting Pacing Amplitude: 2 V
Lead Channel Setting Pacing Amplitude: 2.5 V
Lead Channel Setting Pacing Pulse Width: 0.5 ms
Lead Channel Setting Pacing Pulse Width: 0.5 ms
Lead Channel Setting Sensing Sensitivity: 0.7 mV
Pulse Gen Model: 3562
Pulse Gen Serial Number: 9060242

## 2021-07-19 ENCOUNTER — Ambulatory Visit: Payer: Self-pay | Admitting: Internal Medicine

## 2021-07-22 NOTE — Progress Notes (Signed)
Remote pacemaker transmission.   

## 2021-07-23 ENCOUNTER — Ambulatory Visit: Payer: Self-pay | Admitting: Internal Medicine

## 2021-07-30 ENCOUNTER — Other Ambulatory Visit: Payer: Self-pay

## 2021-07-30 ENCOUNTER — Ambulatory Visit (INDEPENDENT_AMBULATORY_CARE_PROVIDER_SITE_OTHER): Payer: Self-pay | Admitting: Internal Medicine

## 2021-07-30 DIAGNOSIS — Z23 Encounter for immunization: Secondary | ICD-10-CM

## 2021-08-06 ENCOUNTER — Other Ambulatory Visit: Payer: Self-pay | Admitting: Internal Medicine

## 2021-10-09 ENCOUNTER — Ambulatory Visit (INDEPENDENT_AMBULATORY_CARE_PROVIDER_SITE_OTHER): Payer: Self-pay

## 2021-10-09 DIAGNOSIS — I5023 Acute on chronic systolic (congestive) heart failure: Secondary | ICD-10-CM

## 2021-10-09 LAB — CUP PACEART REMOTE DEVICE CHECK
Battery Remaining Longevity: 32 mo
Battery Remaining Percentage: 41 %
Battery Voltage: 2.96 V
Brady Statistic AP VP Percent: 91 %
Brady Statistic AP VS Percent: 0 %
Brady Statistic AS VP Percent: 1 %
Brady Statistic AS VS Percent: 1 %
Brady Statistic RA Percent Paced: 85 %
Date Time Interrogation Session: 20230222020010
Implantable Lead Implant Date: 20191126
Implantable Lead Implant Date: 20191126
Implantable Lead Implant Date: 20191126
Implantable Lead Location: 753858
Implantable Lead Location: 753860
Implantable Lead Location: 753860
Implantable Lead Model: 3830
Implantable Pulse Generator Implant Date: 20191126
Lead Channel Impedance Value: 460 Ohm
Lead Channel Impedance Value: 530 Ohm
Lead Channel Impedance Value: 660 Ohm
Lead Channel Pacing Threshold Amplitude: 0.5 V
Lead Channel Pacing Threshold Amplitude: 0.75 V
Lead Channel Pacing Threshold Amplitude: 1 V
Lead Channel Pacing Threshold Pulse Width: 0.5 ms
Lead Channel Pacing Threshold Pulse Width: 0.5 ms
Lead Channel Pacing Threshold Pulse Width: 1 ms
Lead Channel Sensing Intrinsic Amplitude: 1 mV
Lead Channel Sensing Intrinsic Amplitude: 9.2 mV
Lead Channel Setting Pacing Amplitude: 2 V
Lead Channel Setting Pacing Amplitude: 2 V
Lead Channel Setting Pacing Amplitude: 2.5 V
Lead Channel Setting Pacing Pulse Width: 0.5 ms
Lead Channel Setting Pacing Pulse Width: 0.5 ms
Lead Channel Setting Sensing Sensitivity: 0.7 mV
Pulse Gen Model: 3562
Pulse Gen Serial Number: 9060242

## 2021-10-16 NOTE — Progress Notes (Signed)
Remote pacemaker transmission.   

## 2021-10-22 ENCOUNTER — Other Ambulatory Visit: Payer: Self-pay | Admitting: Internal Medicine

## 2021-10-24 ENCOUNTER — Other Ambulatory Visit: Payer: Self-pay | Admitting: Internal Medicine

## 2021-10-24 MED ORDER — RIVAROXABAN 20 MG PO TABS: 20.0000 mg | ORAL_TABLET | Freq: Every day | ORAL | 11 refills | Status: DC

## 2021-11-01 ENCOUNTER — Other Ambulatory Visit: Payer: Self-pay

## 2021-11-01 MED ORDER — FUROSEMIDE 40 MG PO TABS: ORAL_TABLET | ORAL | 2 refills | Status: DC

## 2021-11-20 ENCOUNTER — Telehealth: Payer: Self-pay

## 2021-11-20 NOTE — Telephone Encounter (Signed)
Patient would like to know if it is fine to take glucosamine chondroitin to help with his joint pain. It is a supplement suggested by someone he knows. ?

## 2021-11-20 NOTE — Telephone Encounter (Signed)
Patient notified of Dr Amil Amen response  ?

## 2021-11-20 NOTE — Telephone Encounter (Signed)
yes

## 2022-01-03 ENCOUNTER — Other Ambulatory Visit: Payer: Self-pay

## 2022-01-03 MED ORDER — CARVEDILOL 6.25 MG PO TABS: ORAL_TABLET | ORAL | 5 refills | Status: DC

## 2022-01-03 MED ORDER — ATORVASTATIN CALCIUM 80 MG PO TABS: ORAL_TABLET | ORAL | 5 refills | Status: DC

## 2022-01-08 ENCOUNTER — Ambulatory Visit: Payer: Self-pay | Admitting: Internal Medicine

## 2022-01-08 ENCOUNTER — Encounter: Payer: Self-pay | Admitting: Internal Medicine

## 2022-01-08 ENCOUNTER — Ambulatory Visit (INDEPENDENT_AMBULATORY_CARE_PROVIDER_SITE_OTHER): Payer: Self-pay

## 2022-01-08 VITALS — BP 120/74 | HR 76 | Resp 12 | Ht 75.0 in | Wt 255.0 lb

## 2022-01-08 DIAGNOSIS — Z23 Encounter for immunization: Secondary | ICD-10-CM

## 2022-01-08 DIAGNOSIS — I8393 Asymptomatic varicose veins of bilateral lower extremities: Secondary | ICD-10-CM

## 2022-01-08 DIAGNOSIS — I447 Left bundle-branch block, unspecified: Secondary | ICD-10-CM

## 2022-01-08 DIAGNOSIS — Z9189 Other specified personal risk factors, not elsewhere classified: Secondary | ICD-10-CM

## 2022-01-08 DIAGNOSIS — D126 Benign neoplasm of colon, unspecified: Secondary | ICD-10-CM

## 2022-01-08 DIAGNOSIS — Z0000 Encounter for general adult medical examination without abnormal findings: Secondary | ICD-10-CM

## 2022-01-08 LAB — CUP PACEART REMOTE DEVICE CHECK
Battery Remaining Longevity: 28 mo
Battery Remaining Percentage: 37 %
Battery Voltage: 2.95 V
Brady Statistic AP VP Percent: 90 %
Brady Statistic AP VS Percent: 0 %
Brady Statistic AS VP Percent: 1 %
Brady Statistic AS VS Percent: 1 %
Brady Statistic RA Percent Paced: 82 %
Date Time Interrogation Session: 20230524020008
Implantable Lead Implant Date: 20191126
Implantable Lead Implant Date: 20191126
Implantable Lead Implant Date: 20191126
Implantable Lead Location: 753858
Implantable Lead Location: 753860
Implantable Lead Location: 753860
Implantable Lead Model: 3830
Implantable Pulse Generator Implant Date: 20191126
Lead Channel Impedance Value: 440 Ohm
Lead Channel Impedance Value: 540 Ohm
Lead Channel Impedance Value: 660 Ohm
Lead Channel Pacing Threshold Amplitude: 0.5 V
Lead Channel Pacing Threshold Amplitude: 0.625 V
Lead Channel Pacing Threshold Amplitude: 1 V
Lead Channel Pacing Threshold Pulse Width: 0.5 ms
Lead Channel Pacing Threshold Pulse Width: 0.5 ms
Lead Channel Pacing Threshold Pulse Width: 1 ms
Lead Channel Sensing Intrinsic Amplitude: 1 mV
Lead Channel Sensing Intrinsic Amplitude: 9.9 mV
Lead Channel Setting Pacing Amplitude: 2 V
Lead Channel Setting Pacing Amplitude: 2 V
Lead Channel Setting Pacing Amplitude: 2.5 V
Lead Channel Setting Pacing Pulse Width: 0.5 ms
Lead Channel Setting Pacing Pulse Width: 0.5 ms
Lead Channel Setting Sensing Sensitivity: 0.7 mV
Pulse Gen Model: 3562
Pulse Gen Serial Number: 9060242

## 2022-01-08 NOTE — Progress Notes (Signed)
Subjective:    Patient ID: Kyle Burke, male   DOB: August 29, 1945, 76 y.o.   MRN: [id number]   HPI  Here for Male CPE:  1.  STE:  Does perform.  No changes.  Has history of very large left inguinal hernia repaired previously and some changes due to that surgery are unchanged.    2.  PSA: Last check 12/2019 and normal at 1.8 ng/ml.  No family history of prostate cancer.    3.  Guaiac Cards/FIT:  Last checked 12/26/20 and negative (guaiac)  4.  Colonoscopy: 07/09/2020 with Dr. Jeral Pinch at proximal descending colon due to compression from large inguinal hernia.  1 rectal polyp with just inflammatory mucosal change.  Diverticulosis.  Was to return once had hernia repair complete.  Performed due to anemia and history of adenomatous polyp    5.  Cholesterol/Glucose:  Cholesterol at goal save for HDL last year.  A1C last 6.4% in November.    6.  Immunizations:  Needs second shingles vaccine.  Due for Pneumococcal 23.  Has not had bivalent COVID booster.   Immunization History  Administered Date(s) Administered   Influenza, High Dose Seasonal PF 10/26/2015   Influenza,inj,Quad PF,6+ Mos 05/17/2017, 06/11/2018, 06/03/2019   Influenza-Unspecified 05/16/2017, 06/02/2019, 04/09/2020, 05/20/2021   Moderna SARS-COV2 Booster Vaccination 06/25/2020, 12/12/2020   Moderna Sars-Covid-2 Vaccination 10/10/2019, 11/08/2019   Pneumococcal Conjugate-13 06/30/2019   Pneumococcal Polysaccharide-23 08/21/2016   Tdap 05/08/2016   Zoster Recombinat (Shingrix) 07/30/2021     Current Meds  Medication Sig   acetaminophen (TYLENOL) 500 MG tablet Take 500 mg by mouth every 8 (eight) hours as needed.   AgaMatrix Ultra-Thin Lancets MISC Check blood glucose twice daily before meals   atorvastatin (LIPITOR) 80 MG tablet TAKE 1 TABLET ('80MG'$  TOTAL) BY MOUTH ONCE A DAY   Blood Glucose Monitoring Suppl (AGAMATRIX PRESTO PRO METER) DEVI Twice daily blood glucose checks before meals   carvedilol  (COREG) 6.25 MG tablet TAKE 1 AND 1/2 TABLETS (9.'375MG'$ ) BY MOUTH 2 TIMES A DAY WITH A MEAL   empagliflozin (JARDIANCE) 10 MG TABS tablet Take 1 tablet (10 mg total) by mouth daily before breakfast.   ferrous gluconate (FERGON) 324 MG tablet Take 1 tablet (324 mg total) by mouth daily with breakfast.   furosemide (LASIX) 40 MG tablet TAKE 1.5 TABLETS BY MOUTH EVERY MORNING AND 1 TABLET EVERY EVENING   glucose blood (AGAMATRIX PRESTO TEST) test strip Twice daily glucose checks before meals   Glycerin-Hypromellose-PEG 400 0.2-0.2-1 % SOLN Place 2 drops into both eyes daily as needed (redness releif).   losartan (COZAAR) 100 MG tablet TAKE 1/2 TABLET ('50MG'$  TOTAL) BY MOUTH AT BEDTIME   rivaroxaban (XARELTO) 20 MG TABS tablet Take 1 tablet (20 mg total) by mouth at bedtime.   sildenafil (VIAGRA) 100 MG tablet TAKE ONE-HALF TO ONE TABLET BY MOUTH ONE-HALF HOUR BEFORE ACTIVITY. ONLY ONE TABLET MAX IN 24 HOURS   Allergies  Allergen Reactions   Spironolactone Other (See Comments)    Gynecomastia   Other     REFUSE BLOOD PRODUCTS   Past Medical History:  Diagnosis Date   Arthritis    "knees" (07/13/2018)   Atrial fibrillation ([place]) 2016   CAD (coronary artery disease)    Dysrhythmia    High cholesterol    Hypertension 2006   Idiopathic cardiomyopathy ([place]) 11/01/2014   Followed by Dr. Acie Fredrickson, Cardiology  EF 25%  Adenosine Cardiolite did not support ischemic etiology   OA (osteoarthritis) of knee  01/08/2017   Pleural effusion    Presence of permanent cardiac pacemaker 07/13/2018   Refusal of blood transfusions as patient is Jehovah's Witness    Type II diabetes mellitus ([place]) 2001   Past Surgical History:  Procedure Laterality Date   BI-VENTRICULAR PACEMAKER INSERTION (CRT-P)  07/13/2018   BIV PACEMAKER INSERTION CRT-P N/A 07/13/2018   Procedure: BIV PACEMAKER INSERTION CRT-P;  Surgeon: Evans Lance, MD;  Location: [place] [facility];  Service: Cardiovascular;  Laterality: N/A;    COLONOSCOPY WITH PROPOFOL N/A 05/14/2016   Procedure: COLONOSCOPY WITH PROPOFOL;  Surgeon: Jerene Bears, MD;  Location: [facility];  Service: Gastroenterology;  Laterality: N/A;   COLONOSCOPY WITH PROPOFOL N/A 07/09/2020   Procedure: COLONOSCOPY WITH PROPOFOL;  Surgeon: Jerene Bears, MD;  Location: [facility];  Service: Gastroenterology;  Laterality: N/A;   ESOPHAGOGASTRODUODENOSCOPY (EGD) WITH PROPOFOL N/A 05/14/2016   Procedure: ESOPHAGOGASTRODUODENOSCOPY (EGD) WITH PROPOFOL;  Surgeon: Jerene Bears, MD;  Location: [facility];  Service: Gastroenterology;  Laterality: N/A;   HEMOSTASIS CLIP PLACEMENT  07/09/2020   Procedure: HEMOSTASIS CLIP PLACEMENT;  Surgeon: Jerene Bears, MD;  Location: [facility];  Service: Gastroenterology;;   INCISE AND DRAIN ABCESS Left 1986   on leg   INGUINAL HERNIA REPAIR  01/16/2012   Procedure: LAPAROSCOPIC INGUINAL HERNIA;  Surgeon: Gayland Curry, MD,FACS;  Location: [facility];  Service: General;  Laterality: Left;  Laparoscopic incarcerated Left inguinal hernia repair with mesh, umbilical hernia repair   INGUINAL HERNIA REPAIR Left 06/20/2021   Procedure: OPEN REPAIR OF RECURENT LEFT INCARCERATED INGUINAL HERNIA WITH MESH;  Surgeon: Greer Pickerel, MD;  Location: [facility];  Service: General;  Laterality: Left;   POLYPECTOMY  07/09/2020   Procedure: POLYPECTOMY;  Surgeon: Jerene Bears, MD;  Location: [facility];  Service: Gastroenterology;;  hot snare   UMBILICAL HERNIA REPAIR  01/16/2012   Procedure: HERNIA REPAIR UMBILICAL ADULT;  Surgeon: Gayland Curry, MD,FACS;  Location: [facility];  Service: General;  Laterality: N/A;   Family History  Problem Relation Age of Onset   Stroke Mother    Heart disease Mother    Heart disease Father    Diabetes Father    Kidney disease Father        , history of stones as well   Cancer Sister 61       unknown soft tissue cancer of leg   Depression Sister    Diabetes Brother    COPD Brother    Pneumonia Brother        COVID    Diabetes Brother    Varicose Veins Son    Varicose Veins Son    Social History   Socioeconomic History   Marital status: Married    Spouse name: Verdis Frederickson   Number of children: 5   Years of education: 9   Highest education level: Not on file  Occupational History   Occupation: Ambulance person constrution-retired    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  Tobacco Use   Smoking status: Never   Smokeless tobacco: Never  Vaping Use   Vaping Use: Never used  Substance and Sexual Activity   Alcohol use: Not Currently    Alcohol/week: 0.0 standard drinks    Comment: 07/13/2018 "used to drink; none since 2015"   Drug use: Never   Sexual activity: Yes    Comment: Viagra helps a bit.  Other Topics Concern   Not on file  Social History Narrative   Came [place]   Lives at home with wife, a son and his girlfriend   Social Determinants of Radio broadcast assistant Strain: Low Risk    Difficulty of Paying Living Expenses: Not hard at all  Food Insecurity: No Food Insecurity   Worried About Charity fundraiser in the Last Year: Never true   Arboriculturist in the Last Year: Never true  Transportation Needs: No Transportation Needs   Lack of Transportation (Medical): No   Lack of Transportation (Non-Medical): No  Physical Activity: Not on file  Stress: Not on file  Social Connections: Not on file  Intimate Partner Violence: Not At Risk   Fear of Current or Ex-Partner: No   Emotionally Abused: No   Physically Abused: No   Sexually Abused: No     Review of Systems  Constitutional:  Negative for unexpected weight change (Has gained 10 lbs in past year.  Has not been very active.).  Respiratory:  Negative for shortness of breath.        No PND or orthopnea symptoms.    Cardiovascular:  Negative for chest pain, palpitations and leg swelling.  Gastrointestinal:  Negative for abdominal pain and blood in stool (No melena).  Musculoskeletal:  Positive for  arthralgias.     Objective:   BP 120/74 (BP Location: Right Arm, Patient Position: Sitting, Cuff Size: Normal)   Pulse 76   Resp 12   Ht '6\' 3"'$  (1.905 m)   Wt 255 lb (115.7 kg)   BMI 31.87 kg/m   Physical Exam HENT:     Head: Normocephalic and atraumatic.     Right Ear: Tympanic membrane, ear canal and external ear normal.     Left Ear: Tympanic membrane, ear canal and external ear normal.     Nose: Nose normal.     Mouth/Throat:     Mouth: Mucous membranes are moist.     Pharynx: Oropharynx is clear.  Eyes:     Extraocular Movements: Extraocular movements intact.     Conjunctiva/sclera: Conjunctivae normal.     Pupils: Pupils are equal, round, and reactive to light.     Comments: Discs sharp  Neck:     Thyroid: No thyroid mass or thyromegaly.  Cardiovascular:     Rate and Rhythm: Normal rate and regular rhythm.     Pulses:          Dorsalis pedis pulses are 1+ on the right side and 1+ on the left side.       Posterior tibial pulses are 1+ on the right side and 1+ on the left side.     Heart sounds: S1 normal and S2 normal. No murmur heard.    No friction rub. No S3 or S4 sounds.     Comments: No carotid bruits.  Carotid, radial, femoral, DP and PT pulses normal and equal.   LE with bilateral varicosities.   Pulmonary:     Effort: Pulmonary effort is normal.     Breath sounds: Normal breath sounds.  Abdominal:     General: Bowel sounds are normal.     Palpations: Abdomen is soft. There is no hepatomegaly, splenomegaly or mass.     Tenderness: There is no abdominal tenderness.     Hernia: No hernia is present. There is no hernia in the left inguinal area or right inguinal area.  Genitourinary:    Penis: Normal.      Testes:        Right:  Mass not present. Right testis is descended.        Left: Mass not present. Left testis is descended.  Musculoskeletal:        General: Normal range of motion.     Cervical back: Normal range of motion and neck supple.     Right  lower leg: No edema.     Left lower leg: No edema.     Comments: Significant hypertrophic change with varus deformity of bilateral knees  Feet:     Right foot:     Protective Sensation: 10 sites tested.  10 sites sensed.     Skin integrity: No dry skin (with fine flaking and hyperpigmentation, right shin.).     Left foot:     Protective Sensation: 10 sites tested.  10 sites sensed.     Skin integrity: Dry skin present.     Comments: Some toenails with thickening and slight discoloration, distal aspects Lymphadenopathy:     Head:     Right side of head: No submental or submandibular adenopathy.     Left side of head: No submental or submandibular adenopathy.     Cervical: No cervical adenopathy.     Upper Body:     Right upper body: No supraclavicular or axillary adenopathy.     Left upper body: No supraclavicular or axillary adenopathy.     Lower Body: No right inguinal adenopathy. No left inguinal adenopathy.  Skin:    General: Skin is warm.     Capillary Refill: Capillary refill takes less than 2 seconds.  Neurological:     General: No focal deficit present.     Mental Status: He is alert and oriented to person, place, and time.     Cranial Nerves: Cranial nerves 2-12 are intact.     Sensory: Sensation is intact.     Motor: Motor function is intact.     Coordination: Coordination is intact.     Gait: Gait is intact.     Deep Tendon Reflexes: Reflexes are normal and symmetric.  Psychiatric:        Speech: Speech normal.        Behavior: Behavior normal. Behavior is cooperative.   .   Assessment & Plan    CPE Moderna COVID bivalent Pneumococcal 23 To call back for 2nd Shingrix.   Follow up in 1 week for fasting labs:  FLP, CBC, CMP, A1C, PSA, Urine microalbumin/crea  2.  DM:  labs  3.  Hypertension:  controlled  4.  History of colonic adenomatous polyp:  colonoscopy aborted in 2021 due to external compression of hernia, which has since been repaired.  Referral back to  Dr. Hilarie Fredrickson.    5.  Knee DJD/varicose veins:  elevate legs.  Consider graduated compression stockings.  Info for [facility] for water exercise given.

## 2022-01-08 NOTE — Patient Instructions (Signed)
[facility], [place] [address]  [phone] Hours of Operation Mondays to Thursdays: 8 am to 8 pm Fridays: 9 am to 8 pm Saturdays: 9 am to 1 pm Sundays: Closed

## 2022-01-14 ENCOUNTER — Other Ambulatory Visit: Payer: Self-pay

## 2022-01-14 DIAGNOSIS — Z1211 Encounter for screening for malignant neoplasm of colon: Secondary | ICD-10-CM

## 2022-01-14 LAB — POC FIT TEST STOOL: Fecal Occult Blood: NEGATIVE

## 2022-01-21 NOTE — Progress Notes (Signed)
Remote pacemaker transmission.   

## 2022-01-24 ENCOUNTER — Other Ambulatory Visit: Payer: Self-pay

## 2022-01-24 DIAGNOSIS — N183 Chronic kidney disease, stage 3 unspecified: Secondary | ICD-10-CM

## 2022-01-24 DIAGNOSIS — E785 Hyperlipidemia, unspecified: Secondary | ICD-10-CM

## 2022-01-24 DIAGNOSIS — Z79899 Other long term (current) drug therapy: Secondary | ICD-10-CM

## 2022-01-24 DIAGNOSIS — Z125 Encounter for screening for malignant neoplasm of prostate: Secondary | ICD-10-CM

## 2022-01-26 LAB — MICROALBUMIN / CREATININE URINE RATIO
Creatinine, Urine: 108.8 mg/dL
Microalb/Creat Ratio: 6 mg/g creat (ref 0–29)
Microalbumin, Urine: 6.3 ug/mL

## 2022-01-26 LAB — CBC WITH DIFFERENTIAL/PLATELET
Basophils Absolute: 0 10*3/uL (ref 0.0–0.2)
Basos: 0 %
EOS (ABSOLUTE): 0.1 10*3/uL (ref 0.0–0.4)
Eos: 2 %
Hematocrit: 39.3 % (ref 37.5–51.0)
Hemoglobin: 13.2 g/dL (ref 13.0–17.7)
Immature Grans (Abs): 0 10*3/uL (ref 0.0–0.1)
Immature Granulocytes: 0 %
Lymphocytes Absolute: 1.3 10*3/uL (ref 0.7–3.1)
Lymphs: 29 %
MCH: 30.6 pg (ref 26.6–33.0)
MCHC: 33.6 g/dL (ref 31.5–35.7)
MCV: 91 fL (ref 79–97)
Monocytes Absolute: 0.4 10*3/uL (ref 0.1–0.9)
Monocytes: 9 %
Neutrophils Absolute: 2.8 10*3/uL (ref 1.4–7.0)
Neutrophils: 60 %
Platelets: 161 10*3/uL (ref 150–450)
RBC: 4.32 x10E6/uL (ref 4.14–5.80)
RDW: 13.5 % (ref 11.6–15.4)
WBC: 4.6 10*3/uL (ref 3.4–10.8)

## 2022-01-26 LAB — COMPREHENSIVE METABOLIC PANEL
ALT: 13 IU/L (ref 0–44)
AST: 15 IU/L (ref 0–40)
Albumin/Globulin Ratio: 1.7 (ref 1.2–2.2)
Albumin: 4.6 g/dL (ref 3.7–4.7)
Alkaline Phosphatase: 90 IU/L (ref 44–121)
BUN/Creatinine Ratio: 22 (ref 10–24)
BUN: 29 mg/dL — ABNORMAL HIGH (ref 8–27)
Bilirubin Total: 1.2 mg/dL (ref 0.0–1.2)
CO2: 24 mmol/L (ref 20–29)
Calcium: 9.3 mg/dL (ref 8.6–10.2)
Chloride: 104 mmol/L (ref 96–106)
Creatinine, Ser: 1.32 mg/dL — ABNORMAL HIGH (ref 0.76–1.27)
Globulin, Total: 2.7 g/dL (ref 1.5–4.5)
Glucose: 107 mg/dL — ABNORMAL HIGH (ref 70–99)
Potassium: 4.6 mmol/L (ref 3.5–5.2)
Sodium: 142 mmol/L (ref 134–144)
Total Protein: 7.3 g/dL (ref 6.0–8.5)
eGFR: 57 mL/min/{1.73_m2} — ABNORMAL LOW (ref >59)

## 2022-01-26 LAB — LIPID PANEL W/O CHOL/HDL RATIO
Cholesterol, Total: 104 mg/dL (ref 100–199)
HDL: 41 mg/dL (ref >39)
LDL Chol Calc (NIH): 48 mg/dL (ref 0–99)
Triglycerides: 74 mg/dL (ref 0–149)
VLDL Cholesterol Cal: 15 mg/dL (ref 5–40)

## 2022-01-26 LAB — PSA: Prostate Specific Ag, Serum: 2 ng/mL (ref 0.0–4.0)

## 2022-01-26 LAB — HEMOGLOBIN A1C
Est. average glucose Bld gHb Est-mCnc: 137 mg/dL
Hgb A1c MFr Bld: 6.4 % — ABNORMAL HIGH (ref 4.8–5.6)

## 2022-04-09 ENCOUNTER — Ambulatory Visit (INDEPENDENT_AMBULATORY_CARE_PROVIDER_SITE_OTHER): Payer: Self-pay

## 2022-04-09 DIAGNOSIS — I447 Left bundle-branch block, unspecified: Secondary | ICD-10-CM

## 2022-04-10 LAB — CUP PACEART REMOTE DEVICE CHECK
Battery Remaining Longevity: 24 mo
Battery Remaining Percentage: 33 %
Battery Voltage: 2.95 V
Brady Statistic AP VP Percent: 89 %
Brady Statistic AP VS Percent: 0 %
Brady Statistic AS VP Percent: 1 %
Brady Statistic AS VS Percent: 1 %
Brady Statistic RA Percent Paced: 82 %
Date Time Interrogation Session: 20230823020010
Implantable Lead Implant Date: 20191126
Implantable Lead Implant Date: 20191126
Implantable Lead Implant Date: 20191126
Implantable Lead Location: 753858
Implantable Lead Location: 753860
Implantable Lead Location: 753860
Implantable Lead Model: 3830
Implantable Pulse Generator Implant Date: 20191126
Lead Channel Impedance Value: 440 Ohm
Lead Channel Impedance Value: 540 Ohm
Lead Channel Impedance Value: 660 Ohm
Lead Channel Pacing Threshold Amplitude: 0.5 V
Lead Channel Pacing Threshold Amplitude: 0.625 V
Lead Channel Pacing Threshold Amplitude: 1 V
Lead Channel Pacing Threshold Pulse Width: 0.5 ms
Lead Channel Pacing Threshold Pulse Width: 0.5 ms
Lead Channel Pacing Threshold Pulse Width: 1 ms
Lead Channel Sensing Intrinsic Amplitude: 1 mV
Lead Channel Sensing Intrinsic Amplitude: 11.1 mV
Lead Channel Setting Pacing Amplitude: 2 V
Lead Channel Setting Pacing Amplitude: 2 V
Lead Channel Setting Pacing Amplitude: 2.5 V
Lead Channel Setting Pacing Pulse Width: 0.5 ms
Lead Channel Setting Pacing Pulse Width: 0.5 ms
Lead Channel Setting Sensing Sensitivity: 0.7 mV
Pulse Gen Model: 3562
Pulse Gen Serial Number: 9060242

## 2022-05-07 NOTE — Progress Notes (Signed)
Remote pacemaker transmission.   

## 2022-05-22 ENCOUNTER — Telehealth: Payer: Self-pay

## 2022-05-22 NOTE — Telephone Encounter (Signed)
Patient would like an appointment to discuss confusion about colonoscopy referral that was never scheduled and also wants a rheumatology referral

## 2022-05-28 ENCOUNTER — Ambulatory Visit: Payer: Self-pay | Admitting: Internal Medicine

## 2022-05-28 ENCOUNTER — Encounter: Payer: Self-pay | Admitting: Internal Medicine

## 2022-05-28 VITALS — BP 120/72 | HR 76 | Resp 16 | Ht 75.0 in | Wt 257.0 lb

## 2022-05-28 DIAGNOSIS — M170 Bilateral primary osteoarthritis of knee: Secondary | ICD-10-CM

## 2022-05-28 DIAGNOSIS — Z23 Encounter for immunization: Secondary | ICD-10-CM

## 2022-05-28 NOTE — Telephone Encounter (Signed)
Pt has been scheduled.  °

## 2022-05-28 NOTE — Progress Notes (Signed)
    Subjective:    Patient ID: Kyle Burke, male   DOB: 1946-06-24, 76 y.o.   MRN: [id number]   HPI  Karen Kays interprets  Bilateral knee pain:  having trouble even getting up one stair.  A friend has the same issues.  Went to a rheumatologist and feels they have helped a lot with his knee pain and so Kyle Burke would like to try the same.  Has end stage DJD of bilateral knees.   States corticosteroid injections really helped minimally in past.  Last was 10/2020 with Dr. Durward Fortes.   Would get total knee replacement save for cost.   Would be willing to go to [facility] ortho and apply for charity care.    Follow up colonic adenoma:  has not been set up with referral for repeat colonoscopy--never responded to [facility] message.   Current Meds  Medication Sig   acetaminophen (TYLENOL) 500 MG tablet Take 500 mg by mouth every 8 (eight) hours as needed.   AgaMatrix Ultra-Thin Lancets MISC Check blood glucose twice daily before meals   atorvastatin (LIPITOR) 80 MG tablet TAKE 1 TABLET ('80MG'$  TOTAL) BY MOUTH ONCE A DAY   Blood Glucose Monitoring Suppl (AGAMATRIX PRESTO PRO METER) DEVI Twice daily blood glucose checks before meals   carvedilol (COREG) 6.25 MG tablet TAKE 1 AND 1/2 TABLETS (9.'375MG'$ ) BY MOUTH 2 TIMES A DAY WITH A MEAL   empagliflozin (JARDIANCE) 10 MG TABS tablet Take 1 tablet (10 mg total) by mouth daily before breakfast.   ferrous gluconate (FERGON) 324 MG tablet Take 1 tablet (324 mg total) by mouth daily with breakfast.   furosemide (LASIX) 40 MG tablet TAKE 1.5 TABLETS BY MOUTH EVERY MORNING AND 1 TABLET EVERY EVENING   glucose blood (AGAMATRIX PRESTO TEST) test strip Twice daily glucose checks before meals   Glycerin-Hypromellose-PEG 400 0.2-0.2-1 % SOLN Place 2 drops into both eyes daily as needed (redness releif).   losartan (COZAAR) 100 MG tablet TAKE 1/2 TABLET ('50MG'$  TOTAL) BY MOUTH AT BEDTIME   rivaroxaban (XARELTO) 20 MG TABS tablet Take 1 tablet (20 mg total)  by mouth at bedtime.   sildenafil (VIAGRA) 100 MG tablet TAKE ONE-HALF TO ONE TABLET BY MOUTH ONE-HALF HOUR BEFORE ACTIVITY. ONLY ONE TABLET MAX IN 24 HOURS   Allergies  Allergen Reactions   Spironolactone Other (See Comments)    Gynecomastia   Other     REFUSE BLOOD PRODUCTS     Review of Systems    Objective:   BP 120/72 (BP Location: Right Arm, Patient Position: Sitting, Cuff Size: Normal)   Pulse 76   Resp 16   Ht '6\' 3"'$  (1.905 m)   Wt 257 lb (116.6 kg)   BMI 32.12 kg/m   Physical Exam Lungs:  CTA CV:  RRR  Difficulty with standing from seated position. Bilateral knees with significant hypertrophy and varus deformity   Assessment & Plan   Severe DJD of bilateral knees:  referral to [facility] Ortho as not clear he could afford to have TKR here.  Will need to have clearance by cardiology and how long to be off Xarelto safely.    2.  HM:  refuses COVID today.  Shingles #2/2.  Checking into getting him set up with GI for follow up colonoscopy.

## 2022-06-05 ENCOUNTER — Other Ambulatory Visit: Payer: Self-pay

## 2022-06-05 MED ORDER — FUROSEMIDE 40 MG PO TABS: ORAL_TABLET | ORAL | 3 refills | Status: DC

## 2022-07-01 ENCOUNTER — Ambulatory Visit (INDEPENDENT_AMBULATORY_CARE_PROVIDER_SITE_OTHER): Payer: Self-pay | Admitting: Nurse Practitioner

## 2022-07-01 ENCOUNTER — Encounter: Payer: Self-pay | Admitting: Nurse Practitioner

## 2022-07-01 VITALS — BP 102/62 | HR 70 | Ht 74.0 in | Wt 256.2 lb

## 2022-07-01 DIAGNOSIS — Z7901 Long term (current) use of anticoagulants: Secondary | ICD-10-CM

## 2022-07-01 DIAGNOSIS — Z86010 Personal history of colonic polyps: Secondary | ICD-10-CM

## 2022-07-01 NOTE — Progress Notes (Signed)
Chief Complaint:  none.    Assessment &  Plan   # 76 yo male with a history of colon polyps. Last surveillance colonoscopy incomplete due to colonic stenosis secondary to incarcerated left inguinal hernia.  Hernia was repaired November 2022.  Patient is here to get scheduled for repeat colonoscopy.  Hold oral iron 7 days prior to colonoscopy Patient's last colonoscopy was done at the hospital, I am assuming because of his history of heart failure and low EF.  Since then he has had repeat echocardiogram showing improved LVEF of 55-60%.  I will talk with Dr. Hilarie Fredrickson and see if he feels that patient still needs to have his procedures done at the hospital.  Following that we will contact patient to schedule the colonoscopy .    #Chronic atrial fibrillation, on Xarelto Hold Xarelto for 2 days before procedure - will instruct when and how to resume after procedure. Patient understands that there is a low but real risk of cardiovascular event such as heart attack, stroke, or embolism /  thrombosis while off blood thinner. The patient consents to proceed. Will communicate by phone or EMR with patient's prescribing provider, Dr. Lovena Le,  to confirm that holding Xarelto is reasonable in this case.  #Chronic systolic heart failure. Severely dilated LA and RA.  Followed by Dr. Loura Back  # See PMH for additional history  HPI  76 yo male known to Dr.  Hilarie Fredrickson ith a past medical history of colon polyps, diverticulosis, PAF on Xarelto, heart failure, LBBB, Biv PPM, CKD 3, DM, left inguinal hernia s/p repair Nov 2022. See PMH /PSH for additional history   Visit conducted with Spanish speaking video interpreter  Patient's last polyp surveillance colonoscopy in 2021 was incomplete due to stenosis of the colon secondary to inguinal hernia.  Plan was to repeat the colonoscopy if hernia was repaired    Interval history:  He had repair of the incarcerated L inguinal hernia hernia last year. He has returned to  get scheduled for repeat colonoscopy.   No blood in stool. He has mild constipation with hard stools but has a BM daily. Taking generic miralax as needed which is generally every other day.    Previous GI Evaluation   Nov 2021 colonoscopy  -Stenosis in the proximal descending colon likely due to large left inguinal hernia containing  bowel. Colonoscopy attempt aborted in this location due this anatomy - Diverticulosis in the sigmoid colon and in the descending colon. - One 10 mm polyp in the rectum, removed with a hot snare. Resected and retrieved. Clip was placed prophylactically given need to resume Xarelto. - Small internal hemorrhoids.   Imaging   Aug 2022 echo IMPRESSIONS Left ventricular ejection fraction, by estimation, is 55 to 60%. The left ventricle has normal function. The left ventricle has no regional wall motion abnormalities. Right ventricular systolic function is normal. The right ventricular size is normal.There is normal pulmonary artery systolic pressure.Left atrial size was severely dilated.Right atrial size was severely dilated.  Labs:     Latest Ref Rng & Units 01/24/2022    8:57 AM 06/07/2021    8:38 AM 12/20/2020    9:24 AM  CBC  WBC 3.4 - 10.8 x10E3/uL 4.6  5.4  5.7   Hemoglobin 13.0 - 17.7 g/dL 13.2  13.1  13.8   Hematocrit 37.5 - 51.0 % 39.3  40.3  40.2   Platelets 150 - 450 x10E3/uL 161  173  173  Latest Ref Rng & Units 01/24/2022    8:57 AM 06/24/2021    3:08 PM 12/20/2020    9:24 AM  Hepatic Function  Total Protein 6.0 - 8.5 g/dL 7.3  6.6  7.6   Albumin 3.7 - 4.7 g/dL 4.6  4.5  4.9   AST 0 - 40 IU/L _0 ALT 0 - 44 IU/L _1 Alk Phosphatase 44 - 121 IU/L 90  88  98   Total Bilirubin 0.0 - 1.2 mg/dL 1.2  1.9  1.3     Past Medical History:  Diagnosis Date   Arthritis    "knees" (07/13/2018)   Atrial fibrillation ([place]) 2016   CAD (coronary artery disease)    Dysrhythmia    High cholesterol    Hypertension 2006   Idiopathic  cardiomyopathy ([place]) 11/01/2014   Followed by Dr. 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   Refusal of blood transfusions as patient is Jehovah's Witness    Type II diabetes mellitus ([place]) 2001    Past Surgical History:  Procedure Laterality Date   BI-VENTRICULAR PACEMAKER INSERTION (CRT-P)  07/13/2018   BIV PACEMAKER INSERTION CRT-P N/A 07/13/2018   Procedure: BIV PACEMAKER INSERTION CRT-P;  Surgeon: Evans Lance, MD;  Location: [facility];  Service: Cardiovascular;  Laterality: N/A;   COLONOSCOPY WITH PROPOFOL N/A 05/14/2016   Procedure: COLONOSCOPY WITH PROPOFOL;  Surgeon: Jerene Bears, MD;  Location: [facility];  Service: Gastroenterology;  Laterality: N/A;   COLONOSCOPY WITH PROPOFOL N/A 07/09/2020   Procedure: COLONOSCOPY WITH PROPOFOL;  Surgeon: Jerene Bears, MD;  Location: [facility];  Service: Gastroenterology;  Laterality: N/A;   ESOPHAGOGASTRODUODENOSCOPY (EGD) WITH PROPOFOL N/A 05/14/2016   Procedure: ESOPHAGOGASTRODUODENOSCOPY (EGD) WITH PROPOFOL;  Surgeon: Jerene Bears, MD;  Location: [facility];  Service: Gastroenterology;  Laterality: N/A;   HEMOSTASIS CLIP PLACEMENT  07/09/2020   Procedure: HEMOSTASIS CLIP PLACEMENT;  Surgeon: Jerene Bears, MD;  Location: [facility];  Service: Gastroenterology;;   INCISE AND DRAIN ABCESS Left 1986   on leg   INGUINAL HERNIA REPAIR  01/16/2012   Procedure: LAPAROSCOPIC INGUINAL HERNIA;  Surgeon: Gayland Curry, MD,FACS;  Location: [facility];  Service: General;  Laterality: Left;  Laparoscopic incarcerated Left inguinal hernia repair with mesh, umbilical hernia repair   INGUINAL HERNIA REPAIR Left 06/20/2021   Procedure: OPEN REPAIR OF RECURENT LEFT INCARCERATED INGUINAL HERNIA WITH MESH;  Surgeon: Greer Pickerel, MD;  Location: [facility];  Service: General;  Laterality: Left;    POLYPECTOMY  07/09/2020   Procedure: POLYPECTOMY;  Surgeon: Jerene Bears, MD;  Location: [facility];  Service: Gastroenterology;;  hot snare   UMBILICAL HERNIA REPAIR  01/16/2012   Procedure: HERNIA REPAIR UMBILICAL ADULT;  Surgeon: Gayland Curry, MD,FACS;  Location: [facility];  Service: General;  Laterality: N/A;    Current Medications, Allergies, Family History and Social History were reviewed in [facility] record.     Current Outpatient Medications  Medication Sig Dispense Refill   acetaminophen (TYLENOL) 500 MG tablet Take 500 mg by mouth every 8 (eight) hours as needed.     AgaMatrix Ultra-Thin Lancets MISC Check blood glucose twice daily before meals 100 each 11   atorvastatin (LIPITOR) 80 MG tablet TAKE 1 TABLET (80MG TOTAL) BY MOUTH  ONCE A DAY 30 tablet 5   Blood Glucose Monitoring Suppl (AGAMATRIX PRESTO PRO METER) DEVI Twice daily blood glucose checks before meals 1 Device 0   carvedilol (COREG) 6.25 MG tablet TAKE 1 AND 1/2 TABLETS (9.375MG) BY MOUTH 2 TIMES A DAY WITH A MEAL 90 tablet 5   empagliflozin (JARDIANCE) 10 MG TABS tablet Take 1 tablet (10 mg total) by mouth daily before breakfast. 30 tablet 11   ferrous gluconate (FERGON) 324 MG tablet Take 1 tablet (324 mg total) by mouth daily with breakfast.  3   furosemide (LASIX) 40 MG tablet TAKE 1.5 TABLETS BY MOUTH EVERY MORNING AND 1 TABLET EVERY EVENING 225 tablet 3   glucose blood (AGAMATRIX PRESTO TEST) test strip Twice daily glucose checks before meals 100 each 12   Glycerin-Hypromellose-PEG 400 0.2-0.2-1 % SOLN Place 2 drops into both eyes daily as needed (redness releif).     losartan (COZAAR) 100 MG tablet TAKE 1/2 TABLET (50MG TOTAL) BY MOUTH AT BEDTIME 15 tablet 11   rivaroxaban (XARELTO) 20 MG TABS tablet Take 1 tablet (20 mg total) by mouth at bedtime. 30 tablet 11   sildenafil (VIAGRA) 100 MG tablet TAKE ONE-HALF TO ONE TABLET BY MOUTH ONE-HALF HOUR BEFORE ACTIVITY. ONLY ONE TABLET MAX IN 24  HOURS 30 tablet 4   No current facility-administered medications for this visit.    Review of Systems: No chest pain. No shortness of breath. No urinary complaints.    Physical Exam  Wt Readings from Last 3 Encounters:  07/01/22 256 lb 4 oz (116.2 kg)  05/28/22 257 lb (116.6 kg)  01/08/22 255 lb (115.7 kg)    BP 102/62   Pulse 70   Ht _0  (1.88 m)   Wt 256 lb 4 oz (116.2 kg)   BMI 32.90 kg/m  Constitutional:  Generally well appearing male in no acute distress. Psychiatric: Pleasant. Normal mood and affect. Behavior is normal. EENT: Pupils normal.  Conjunctivae are normal. No scleral icterus. Neck supple.  Cardiovascular: Normal rate, regular rhythm. No edema Pulmonary/chest: Effort normal and breath sounds normal. No wheezing, rales or rhonchi. Abdominal: Soft, nondistended, nontender. Bowel sounds active throughout. There are no masses palpable. No hepatomegaly. Neurological: Alert and oriented to person place and time. Skin: Skin is warm and dry. No rashes noted.  Tye Savoy, NP  07/01/2022, 2:18 PM

## 2022-07-01 NOTE — Patient Instructions (Signed)
_______________________________________________________  If you are age 75 or older, your body mass index should be between 23-30. Your Body mass index is 32.9 kg/m. If this is out of the aforementioned range listed, please consider follow up with your Primary Care Provider.  If you are age 48 or younger, your body mass index should be between 19-25. Your Body mass index is 32.9 kg/m. If this is out of the aformentioned range listed, please consider follow up with your Primary Care Provider.   We will call to schedule Colonoscopy after speaking with Dr.Pyrtle on whether procedure should be done up stairs or at the hospital.   The [facility] providers would like to encourage you to use [facility] to communicate with providers for non-urgent requests or questions.  Due to long hold times on the telephone, sending your provider a message by [facility] may be a faster and more efficient way to get a response.  Please allow 48 business hours for a response.  Please remember that this is for non-urgent requests.   It was a pleasure to see you today!  Thank you for trusting me with your gastrointestinal care!

## 2022-07-01 NOTE — Progress Notes (Signed)
Addendum: Reviewed and agree with assessment and management plan. EF has recovered since hospital procedure in 2021 (incomplete exam as you noted) ECHO reviewed and no LV dysfunction, pulm HTN or elevated right atrial pressure.  Would be ok for [facility].  I have copied Osvaldo Angst, CRNA to ensure he is in agreement.  Latiesha Harada, Lajuan Lines, MD

## 2022-07-04 ENCOUNTER — Other Ambulatory Visit: Payer: Self-pay

## 2022-07-04 ENCOUNTER — Telehealth: Payer: Self-pay

## 2022-07-04 DIAGNOSIS — Z86010 Personal history of colonic polyps: Secondary | ICD-10-CM

## 2022-07-04 MED ORDER — EMPAGLIFLOZIN 10 MG PO TABS: 10.0000 mg | ORAL_TABLET | Freq: Every day | ORAL | 11 refills | Status: DC

## 2022-07-04 MED ORDER — CARVEDILOL 6.25 MG PO TABS: ORAL_TABLET | ORAL | 11 refills | Status: DC

## 2022-07-04 MED ORDER — AGAMATRIX PRESTO TEST VI STRP: ORAL_STRIP | 12 refills | Status: DC

## 2022-07-04 MED ORDER — NA SULFATE-K SULFATE-MG SULF 17.5-3.13-1.6 GM/177ML PO SOLN: 1.0000 | Freq: Once | ORAL | 0 refills | Status: AC

## 2022-07-04 MED ORDER — FUROSEMIDE 40 MG PO TABS: ORAL_TABLET | ORAL | 3 refills | Status: DC

## 2022-07-04 MED ORDER — ATORVASTATIN CALCIUM 80 MG PO TABS: ORAL_TABLET | ORAL | 11 refills | Status: DC

## 2022-07-04 NOTE — Telephone Encounter (Signed)
Called pt using interpreter service and let him know Paula's message. Pt verbalized understanding. Pt is scheduled for colonoscopy on 08/20/22 at 3:30 in [facility] with Dr. Hilarie Fredrickson. Cardiac clearance requested. Ambulatory referral placed and prep sent to patient's pharmacy. Instructions mailed to patient.   Called pt using interpreter services to go over instructions and to make sure pt did not have any questions. Pt verbalized understanding.

## 2022-07-04 NOTE — Telephone Encounter (Signed)
[facility] Pre-operative Risk Assessment     Request for surgical clearance:     Endoscopy Procedure  What type of surgery is being performed?     Colonoscopy   When is this surgery scheduled?     08/20/22  What type of clearance is required ?   Pharmacy  Are there any medications that need to be held prior to surgery and how long? Xarelto - 2 day hold   Practice name and name of physician performing surgery?      [facility]  What is your office phone and fax number?      Phone- [phone]  [phone]  Anesthesia type (None, local, MAC, general) ?       MAC

## 2022-07-04 NOTE — Telephone Encounter (Signed)
-----   Message from Willia Craze, NP sent at 07/02/2022  1:39 PM EST ----- Mickel Baas, you will need a Spanish interpreter for this call.  Would you please let patient know that I discussed his case with Dr. Hilarie Fredrickson.  His heart function did indeed improve since the last time we attempted a colonoscopy on him at the hospital.  He is okay to do it at the [facility] now.  Would you schedule him a colonoscopy with Dr. Hilarie Fredrickson for history of colon polyps.  He needs to be off Xarelto, his cardiologist is Dr. Lovena Le.  Also please hold oral iron 7 days prior to the procedure,  thank you ----- Message ----- From: Jerene Bears, MD Sent: 07/01/2022   4:18 PM EST To: Osvaldo Angst, CRNA; Willia Craze, NP     ----- Message ----- From: Willia Craze, NP Sent: 07/01/2022   2:52 PM EST To: Jerene Bears, MD

## 2022-07-07 ENCOUNTER — Other Ambulatory Visit: Payer: Self-pay | Admitting: Internal Medicine

## 2022-07-07 MED ORDER — LOSARTAN POTASSIUM 100 MG PO TABS: ORAL_TABLET | ORAL | 11 refills | Status: DC

## 2022-07-07 NOTE — Telephone Encounter (Signed)
    Primary Cardiologist:Philip Nahser, MD  Chart reviewed as part of pre-operative protocol coverage. Because of Kyle Burke past medical history and time since last visit, he/she will require a follow-up visit in order to better assess preoperative cardiovascular risk.  Pre-op covering staff: - Please schedule appointment and call patient to inform them. - Please contact requesting surgeon's office via preferred method (i.e, phone, fax) to inform them of need for appointment prior to surgery.  If applicable, this message will also be routed to pharmacy pool and/or primary cardiologist for input on holding anticoagulant/antiplatelet agent as requested below so that this information is available at time of patient's appointment.   Deberah Pelton, NP  07/07/2022, 10:28 AM

## 2022-07-07 NOTE — Telephone Encounter (Signed)
Patient with diagnosis of afib on Xarelto for anticoagulation.    Procedure: colonoscopy Date of procedure: 08/20/22  CHA2DS2-VASc Score = 6  This indicates a 9.7% annual risk of stroke. The patient's score is based upon: CHF History: 1 HTN History: 1 Diabetes History: 1 Stroke History: 0 Vascular Disease History: 1 Age Score: 2 Gender Score: 0   CrCl 78m/min using adjusted body weight Platelet count 161K  Pt followed in CHF clinic and has not been seen in over a year, needs follow up office visit. Assuming pt is stable at that time with no new comorbidities, he can hold Xarelto for 2 days prior to procedure.    **This guidance is not considered finalized until pre-operative APP has relayed final recommendations.**

## 2022-07-08 NOTE — Telephone Encounter (Signed)
Follow-up cardiology input after his appt on 07/21/22

## 2022-07-08 NOTE — Telephone Encounter (Signed)
S/w the pt's son [facility]y [facility]) and granddaughter. Pt has been scheduled to see Nicholes Rough, [facility] 07/21/22 @ 8:50.

## 2022-07-09 ENCOUNTER — Ambulatory Visit (INDEPENDENT_AMBULATORY_CARE_PROVIDER_SITE_OTHER): Payer: Self-pay

## 2022-07-09 DIAGNOSIS — I447 Left bundle-branch block, unspecified: Secondary | ICD-10-CM

## 2022-07-09 LAB — CUP PACEART REMOTE DEVICE CHECK
Battery Remaining Longevity: 20 mo
Battery Remaining Percentage: 29 %
Battery Voltage: 2.93 V
Brady Statistic AP VP Percent: 89 %
Brady Statistic AP VS Percent: 0 %
Brady Statistic AS VP Percent: 1 %
Brady Statistic AS VS Percent: 1 %
Brady Statistic RA Percent Paced: 81 %
Date Time Interrogation Session: 20231122020013
Implantable Lead Connection Status: 753985
Implantable Lead Connection Status: 753985
Implantable Lead Connection Status: 753985
Implantable Lead Implant Date: 20191126
Implantable Lead Implant Date: 20191126
Implantable Lead Implant Date: 20191126
Implantable Lead Location: 753858
Implantable Lead Location: 753860
Implantable Lead Location: 753860
Implantable Lead Model: 3830
Implantable Pulse Generator Implant Date: 20191126
Lead Channel Impedance Value: 440 Ohm
Lead Channel Impedance Value: 540 Ohm
Lead Channel Impedance Value: 660 Ohm
Lead Channel Pacing Threshold Amplitude: 0.5 V
Lead Channel Pacing Threshold Amplitude: 0.625 V
Lead Channel Pacing Threshold Amplitude: 1 V
Lead Channel Pacing Threshold Pulse Width: 0.5 ms
Lead Channel Pacing Threshold Pulse Width: 0.5 ms
Lead Channel Pacing Threshold Pulse Width: 1 ms
Lead Channel Sensing Intrinsic Amplitude: 1 mV
Lead Channel Sensing Intrinsic Amplitude: 10.6 mV
Lead Channel Setting Pacing Amplitude: 2 V
Lead Channel Setting Pacing Amplitude: 2 V
Lead Channel Setting Pacing Amplitude: 2.5 V
Lead Channel Setting Pacing Pulse Width: 0.5 ms
Lead Channel Setting Pacing Pulse Width: 0.5 ms
Lead Channel Setting Sensing Sensitivity: 0.7 mV
Pulse Gen Model: 3562
Pulse Gen Serial Number: 9060242

## 2022-07-17 NOTE — Progress Notes (Signed)
Office Visit    Patient Name: Kyle Burke Date of Encounter: 07/21/2022  PCP:  Mack Hook, [facility]  Cardiologist:  Mertie Moores, MD  Advanced Practice Provider:  No care team member to display Electrophysiologist:  Cristopher Peru, MD   HPI    Kyle Burke is a 76 y.o. male with a past medical history of chronic systolic heart failure, NICM, LBBB, DMII, chronic Afib, and chronic anticoagulation with xarelto presents today for follow-up appointment.  He has been followed by Dr. Philbert Riser for years. He was last seen 01/2018 and weighed 220 lbs.  He was admitted 53/64 with systolic heart failure. He was diuresed with Lasix IV and transitioned to PO at discharge. EP was consulted for LBBB. Underwent placement of CRT-P on 07/13/18. He follows Dr. Lovena Le.   He was seen in Jan 2021 by the HF clinic and was not requiring a loop diuretic.   He was seen 8/22 for preop evaluation with the heart faiulre team. He was mowing grass for 2 hours with a push mower. No C or SOB. Echo 04/11/21 with LVEF 55-60%. Severe biatrial enlargement.  Today, he presents for preop clearance. He feels good overall. Since his pacemaker was placed he has been doing really well. No  issues with CP or SOB. No issues with arrhythmias.  He mows the lawn for his nephew for an hour and a half and he feels good. Before his pacemaker he would get tired. He is no longer accumulating fluid and is euvolemic on exam today. He does have some issues with his knees which keeps him from doing stairs. He does attend the gym and enjoys water aerobics, biking, and treadmill.  He can hold Xarelto for 2 days prior to procedure. Please resume when medically safe to do so.    Past Medical History    Past Medical History:  Diagnosis Date   Arthritis    "knees" (07/13/2018)   Atrial fibrillation ([place]) 2016   CAD (coronary artery disease)    Dysrhythmia    High cholesterol     Hypertension 2006   Idiopathic cardiomyopathy ([place]) 11/01/2014   Followed by Dr. 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   Refusal of blood transfusions as patient is Jehovah's Witness    Type II diabetes mellitus ([place]) 2001   Past Surgical History:  Procedure Laterality Date   BI-VENTRICULAR PACEMAKER INSERTION (CRT-P)  07/13/2018   BIV PACEMAKER INSERTION CRT-P N/A 07/13/2018   Procedure: BIV PACEMAKER INSERTION CRT-P;  Surgeon: Evans Lance, MD;  Location: [facility];  Service: Cardiovascular;  Laterality: N/A;   COLONOSCOPY WITH PROPOFOL N/A 05/14/2016   Procedure: COLONOSCOPY WITH PROPOFOL;  Surgeon: Jerene Bears, MD;  Location: [facility];  Service: Gastroenterology;  Laterality: N/A;   COLONOSCOPY WITH PROPOFOL N/A 07/09/2020   Procedure: COLONOSCOPY WITH PROPOFOL;  Surgeon: Jerene Bears, MD;  Location: [facility];  Service: Gastroenterology;  Laterality: N/A;   ESOPHAGOGASTRODUODENOSCOPY (EGD) WITH PROPOFOL N/A 05/14/2016   Procedure: ESOPHAGOGASTRODUODENOSCOPY (EGD) WITH PROPOFOL;  Surgeon: Jerene Bears, MD;  Location: [facility];  Service: Gastroenterology;  Laterality: N/A;   HEMOSTASIS CLIP PLACEMENT  07/09/2020   Procedure: HEMOSTASIS CLIP PLACEMENT;  Surgeon: Jerene Bears, MD;  Location: [facility];  Service: Gastroenterology;;   INCISE AND DRAIN ABCESS  Left 1986   on leg   INGUINAL HERNIA REPAIR  01/16/2012   Procedure: LAPAROSCOPIC INGUINAL HERNIA;  Surgeon: Gayland Curry, MD,FACS;  Location: [facility];  Service: General;  Laterality: Left;  Laparoscopic incarcerated Left inguinal hernia repair with mesh, umbilical hernia repair   INGUINAL HERNIA REPAIR Left 06/20/2021   Procedure: OPEN REPAIR OF RECURENT LEFT INCARCERATED INGUINAL HERNIA WITH MESH;  Surgeon: Greer Pickerel, MD;  Location: [facility];  Service:  General;  Laterality: Left;   POLYPECTOMY  07/09/2020   Procedure: POLYPECTOMY;  Surgeon: Jerene Bears, MD;  Location: [facility];  Service: Gastroenterology;;  hot snare   UMBILICAL HERNIA REPAIR  01/16/2012   Procedure: HERNIA REPAIR UMBILICAL ADULT;  Surgeon: Gayland Curry, MD,FACS;  Location: [facility];  Service: General;  Laterality: N/A;    Allergies  Allergies  Allergen Reactions   Spironolactone Other (See Comments)    Gynecomastia   Other     REFUSE BLOOD PRODUCTS    EKGs/Labs/Other Studies Reviewed:   The following studies were reviewed today:  Echocardiogram 04/11/21 IMPRESSIONS     1. Left ventricular ejection fraction, by estimation, is 55 to 60%. The  left ventricle has normal function. The left ventricle has no regional  wall motion abnormalities. The left ventricular internal cavity size was  mildly dilated. Left ventricular  diastolic function could not be evaluated.   2. Right ventricular systolic function is normal. The right ventricular  size is normal. There is normal pulmonary artery systolic pressure.   3. Left atrial size was severely dilated.   4. Right atrial size was severely dilated.   5. The mitral valve is normal in structure. Mild mitral valve  regurgitation. No evidence of mitral stenosis.   6. The aortic valve is normal in structure. There is mild calcification  of the aortic valve. Aortic valve regurgitation is mild. No aortic  stenosis is present.   7. The inferior vena cava is normal in size with greater than 50%  respiratory variability, suggesting right atrial pressure of 3 mmHg.   EKG:  EKG is  ordered today.  The ekg ordered today demonstrates paced rhythm, atrial fibrillation with left bundle branch block  Recent Labs: 07/18/2022: ALT 14; BUN 27; Creatinine, Ser 1.43; Hemoglobin 13.6; Platelets 156; Potassium 4.4; Sodium 141  Recent Lipid Panel    Component Value Date/Time   CHOL 111 07/18/2022 1104   TRIG 66 07/18/2022 1104    HDL 41 07/18/2022 1104   CHOLHDL 4.0 09/15/2014 1037   VLDL 12 09/15/2014 1037   LDLCALC 56 07/18/2022 1104    Risk Assessment/Calculations:   CHA2DS2-VASc Score = 6   This indicates a 9.7% annual risk of stroke. The patient's score is based upon: CHF History: 1 HTN History: 1 Diabetes History: 1 Stroke History: 0 Vascular Disease History: 1 Age Score: 2 Gender Score: 0     Home Medications   Current Meds  Medication Sig   acetaminophen (TYLENOL) 500 MG tablet Take 500 mg by mouth every 8 (eight) hours as needed.   AgaMatrix Ultra-Thin Lancets MISC Check blood glucose twice daily before meals   atorvastatin (LIPITOR) 80 MG tablet TAKE 1 TABLET ('80MG'$  TOTAL) BY MOUTH ONCE A DAY   Blood Glucose Monitoring Suppl (AGAMATRIX PRESTO PRO METER) DEVI Twice daily blood glucose checks before meals   carvedilol (COREG) 6.25 MG tablet TAKE 1 AND 1/2 TABLETS (9.'375MG'$ ) BY MOUTH 2 TIMES A DAY WITH A MEAL   ciclopirox (PENLAC) 8 % solution Apply  topically at bedtime. Apply over nail and surrounding skin. Apply daily over previous coat. After seven (7) days, may remove with alcohol and continue cycle.   empagliflozin (JARDIANCE) 10 MG TABS tablet Take 1 tablet (10 mg total) by mouth daily before breakfast.   ferrous gluconate (FERGON) 324 MG tablet Take 1 tablet (324 mg total) by mouth daily with breakfast.   furosemide (LASIX) 40 MG tablet TAKE 1.5 TABLETS BY MOUTH EVERY MORNING AND 1 TABLET EVERY EVENING   glucose blood (AGAMATRIX PRESTO TEST) test strip Twice daily glucose checks before meals   Glycerin-Hypromellose-PEG 400 0.2-0.2-1 % SOLN Place 2 drops into both eyes daily as needed (redness releif).   losartan (COZAAR) 100 MG tablet TAKE 1/2 TABLET ('50MG'$  TOTAL) BY MOUTH AT BEDTIME   rivaroxaban (XARELTO) 20 MG TABS tablet Take 1 tablet (20 mg total) by mouth at bedtime.   sildenafil (VIAGRA) 100 MG tablet TAKE ONE-HALF TO ONE TABLET BY MOUTH ONE-HALF HOUR BEFORE ACTIVITY. ONLY ONE TABLET  MAX IN 24 HOURS   terbinafine (LAMISIL) 1 % cream Apply to toes, in between toes with flaking and feet twice daily for first 14 days using penlac     Review of Systems      All other systems reviewed and are otherwise negative except as noted above.  Physical Exam    VS:  BP 114/76   Pulse 75   Ht '6\' 2"'$  (1.88 m)   Wt 255 lb 3.2 oz (115.8 kg)   SpO2 97%   BMI 32.77 kg/m  , BMI Body mass index is 32.77 kg/m.  Wt Readings from Last 3 Encounters:  07/21/22 255 lb 3.2 oz (115.8 kg)  07/18/22 253 lb (114.8 kg)  07/01/22 256 lb 4 oz (116.2 kg)     GEN: Well nourished, well developed, in no acute distress. HEENT: normal. Neck: Supple, no JVD, carotid bruits, or masses. Cardiac: RRR, no murmurs, rubs, or gallops. No clubbing, cyanosis, edema.  Radials/PT 2+ and equal bilaterally.  Respiratory:  Respirations regular and unlabored, clear to auscultation bilaterally. GI: Soft, nontender, nondistended. MS: No deformity or atrophy. Skin: Warm and dry, no rash. Neuro:  Strength and sensation are intact. Psych: Normal affect.  Assessment & Plan    Preop clearance  Mr. Otis Peak perioperative risk of a major cardiac event is 0.9% according to the Revised Cardiac Risk Index (RCRI).  Therefore, he is at low risk for perioperative complications.   His functional capacity is good at 5.87 METs according to the Duke Activity Status Index (DASI). Recommendations: According to ACC/AHA guidelines, no further cardiovascular testing needed.  The patient may proceed to surgery at acceptable risk.   Antiplatelet and/or Anticoagulation Recommendations:  Xarelto (Rivaroxaban) can be held for 2 days prior to surgery.  Please resume post op when felt to be safe.    Chronic systolic heart failure, NICM -small amount of lower ext edema today, no SOB -continue current medications including: Lipitor 80 mg daily, Coreg 9.375 mg twice daily, Jardiance 10 mg daily, Lasix 60 mg daily, Cozaar 50 mg  daily, Xarelto 20 mg daily  LBBB s/p [employer] Jude CRT-P -stable -will arrange for f/u with Dr. Lovena Le  Chronic Afib -asymptomatic at this time -no issues with xarelto -CHA2DS2-VASc score indicates 9.7% annual stroke risk       Disposition: Follow up 6 months  with Mertie Moores, MD or APP. 2-3 months with Dr. Lovena Le  Signed, Elgie Collard, PA-C 07/21/2022, 9:12 AM [place]

## 2022-07-18 ENCOUNTER — Encounter: Payer: Self-pay | Admitting: Internal Medicine

## 2022-07-18 ENCOUNTER — Ambulatory Visit: Payer: Self-pay | Admitting: Internal Medicine

## 2022-07-18 VITALS — BP 122/82 | HR 80 | Resp 16 | Ht 74.0 in | Wt 253.0 lb

## 2022-07-18 DIAGNOSIS — B351 Tinea unguium: Secondary | ICD-10-CM

## 2022-07-18 DIAGNOSIS — D126 Benign neoplasm of colon, unspecified: Secondary | ICD-10-CM

## 2022-07-18 DIAGNOSIS — E785 Hyperlipidemia, unspecified: Secondary | ICD-10-CM

## 2022-07-18 DIAGNOSIS — M170 Bilateral primary osteoarthritis of knee: Secondary | ICD-10-CM

## 2022-07-18 DIAGNOSIS — N183 Chronic kidney disease, stage 3 unspecified: Secondary | ICD-10-CM

## 2022-07-18 DIAGNOSIS — D649 Anemia, unspecified: Secondary | ICD-10-CM

## 2022-07-18 DIAGNOSIS — I10 Essential (primary) hypertension: Secondary | ICD-10-CM

## 2022-07-18 DIAGNOSIS — Z23 Encounter for immunization: Secondary | ICD-10-CM

## 2022-07-18 MED ORDER — CICLOPIROX 8 % EX SOLN: Freq: Every day | CUTANEOUS | 11 refills | Status: DC

## 2022-07-18 MED ORDER — TERBINAFINE HCL 1 % EX CREA: TOPICAL_CREAM | CUTANEOUS | 0 refills | Status: DC

## 2022-07-18 NOTE — Progress Notes (Signed)
Subjective:    Patient ID: Kyle Burke, male   DOB: [birth year], 76 y.o.   MRN: [id number]   HPI   History of colon polyps:  planning for follow up colonoscopy 08/20/2022.   Cardiology to evaluate for clearance and length of hold for Xarelto on the 4th of this month.    2.  Bilateral severe OA of knees:  was seen by Ortho at [facility] with Peyton Najjar, PA/Arvind Ricki Miller, M.D. beginning of November and Knee replacement recommended.  Needs clearance from primary, cardiology and dental.  He is not in the pool any longer and concerned he is developing abdominal obesity.  States he has to take one of grandchildren to school daily and has thrown him off his pool exercise.    3.  DM:  Has been chronically well controlled.  Did get an eye exam last year and was told no need for new Rx for lenses.   Sugars generally running 110 or under.   4.  Hypertension:  has been controlled.  States he also checks at home and always running in 120/  Current Meds  Medication Sig   acetaminophen (TYLENOL) 500 MG tablet Take 500 mg by mouth every 8 (eight) hours as needed.   AgaMatrix Ultra-Thin Lancets MISC Check blood glucose twice daily before meals   atorvastatin (LIPITOR) 80 MG tablet TAKE 1 TABLET (  TOTAL) BY MOUTH ONCE A DAY   Blood Glucose Monitoring Suppl (AGAMATRIX PRESTO PRO METER) DEVI Twice daily blood glucose checks before meals   carvedilol (COREG) 6.25 MG tablet TAKE 1 AND 1/2 TABLETS (9.375MG ) BY MOUTH 2 TIMES A DAY WITH A MEAL   empagliflozin (JARDIANCE) 10 MG TABS tablet Take 1 tablet (10 mg total) by mouth daily before breakfast.   ferrous gluconate (FERGON) 324 MG tablet Take 1 tablet (324 mg total) by mouth daily with breakfast.   furosemide (LASIX) 40 MG tablet TAKE 1.5 TABLETS BY MOUTH EVERY MORNING AND 1 TABLET EVERY EVENING   glucose blood (AGAMATRIX PRESTO TEST) test strip Twice daily glucose checks before meals   Glycerin-Hypromellose-PEG 400 0.2-0.2-1 % SOLN Place 2  drops into both eyes daily as needed (redness releif).   losartan (COZAAR) 100 MG tablet TAKE 1/2 TABLET (  TOTAL) BY MOUTH AT BEDTIME   rivaroxaban (XARELTO) 20 MG TABS tablet Take 1 tablet (20 mg total) by mouth at bedtime.   sildenafil (VIAGRA) 100 MG tablet TAKE ONE-HALF TO ONE TABLET BY MOUTH ONE-HALF HOUR BEFORE ACTIVITY. ONLY ONE TABLET MAX IN 24 HOURS   Allergies  Allergen Reactions   Spironolactone Other (See Comments)    Gynecomastia   Other     REFUSE BLOOD PRODUCTS     Review of Systems  HENT:  Negative for dental problem.   Respiratory:  Negative for shortness of breath.   Cardiovascular:  Positive for leg swelling (mild chronic LE edema.  Also with varicosities of LE.). Negative for chest pain and palpitations.       No PND or orthopnea symptoms  Neurological:  Negative for dizziness, speech difficulty, weakness, light-headedness and numbness.      Objective:   BP 122/82 (BP Location: Left Arm, Patient Position: Sitting, Cuff Size: Normal)   Pulse 80   Resp 16   Ht  (1.88 m)   Wt 253 lb (114.8 kg)   BMI 32.48 kg/m   Physical Exam HEENT:  PERRL, EOMI, TMs pearly gray, throat without injection Neck:  Supple, No adenopathy, no thyromegaly Chest:  CTA CV:  RRR with normal S1 and S2, No S3, S4 or murmur.  No carotid bruits.  Carotid, radial and DP pulses normal and equal. Abd:  S, NT, No HSM or mass, + BS LE:  Severe hypertrophic and varus deformities of bilateral knees.  + varicosities bilaterally.  Trace ankle edema currently.  Feet with flaking throughout and nails thickened and discolored.   Neuro:  A & O x 3, CN II-XII grossly intact.  Motor 5/5. DTRs 2+/4 throughout.     Assessment & Plan   Severe OA of knees:  Has had stable chronic medical issues.  Checking labs, but if no surprises, cleared from primary care standpoint.  Will need Cardiology clearance  2. Normocytic anemia with history of adenomatous polyp.  To have repeat colonoscopy next  month--cardiology to decide on length of time off Xarelto.  CBC  3.  DM:  A1C  4. Hypertension:  controlled.    5.  Dyslipidemia:  FLP  6.  Tinea pedis and onychomycosis:  Terbinafine cream for skin of feet twice daily.  Ciclopirox  topical nail treatment.  Instructions given.  Discussed will likely take months to a year to resolve.   7.  HM:  Spikevax COVID booster.

## 2022-07-19 LAB — CBC WITH DIFFERENTIAL/PLATELET
Basophils Absolute: 0 10*3/uL (ref 0.0–0.2)
Basos: 1 %
EOS (ABSOLUTE): 0.1 10*3/uL (ref 0.0–0.4)
Eos: 2 %
Hematocrit: 41.1 % (ref 37.5–51.0)
Hemoglobin: 13.6 g/dL (ref 13.0–17.7)
Immature Grans (Abs): 0 10*3/uL (ref 0.0–0.1)
Immature Granulocytes: 0 %
Lymphocytes Absolute: 1.3 10*3/uL (ref 0.7–3.1)
Lymphs: 28 %
MCH: 30.5 pg (ref 26.6–33.0)
MCHC: 33.1 g/dL (ref 31.5–35.7)
MCV: 92 fL (ref 79–97)
Monocytes Absolute: 0.5 10*3/uL (ref 0.1–0.9)
Monocytes: 10 %
Neutrophils Absolute: 2.7 10*3/uL (ref 1.4–7.0)
Neutrophils: 59 %
Platelets: 156 10*3/uL (ref 150–450)
RBC: 4.46 x10E6/uL (ref 4.14–5.80)
RDW: 13.1 % (ref 11.6–15.4)
WBC: 4.6 10*3/uL (ref 3.4–10.8)

## 2022-07-19 LAB — LIPID PANEL W/O CHOL/HDL RATIO
Cholesterol, Total: 111 mg/dL (ref 100–199)
HDL: 41 mg/dL (ref >39)
LDL Chol Calc (NIH): 56 mg/dL (ref 0–99)
Triglycerides: 66 mg/dL (ref 0–149)
VLDL Cholesterol Cal: 14 mg/dL (ref 5–40)

## 2022-07-19 LAB — COMPREHENSIVE METABOLIC PANEL
ALT: 14 IU/L (ref 0–44)
AST: 15 IU/L (ref 0–40)
Albumin/Globulin Ratio: 1.7 (ref 1.2–2.2)
Albumin: 4.6 g/dL (ref 3.8–4.8)
Alkaline Phosphatase: 96 IU/L (ref 44–121)
BUN/Creatinine Ratio: 19 (ref 10–24)
BUN: 27 mg/dL (ref 8–27)
Bilirubin Total: 1.3 mg/dL — ABNORMAL HIGH (ref 0.0–1.2)
CO2: 25 mmol/L (ref 20–29)
Calcium: 9.4 mg/dL (ref 8.6–10.2)
Chloride: 103 mmol/L (ref 96–106)
Creatinine, Ser: 1.43 mg/dL — ABNORMAL HIGH (ref 0.76–1.27)
Globulin, Total: 2.7 g/dL (ref 1.5–4.5)
Glucose: 110 mg/dL — ABNORMAL HIGH (ref 70–99)
Potassium: 4.4 mmol/L (ref 3.5–5.2)
Sodium: 141 mmol/L (ref 134–144)
Total Protein: 7.3 g/dL (ref 6.0–8.5)
eGFR: 51 mL/min/{1.73_m2} — ABNORMAL LOW (ref >59)

## 2022-07-19 LAB — HGB A1C W/O EAG: Hgb A1c MFr Bld: 6.5 % — ABNORMAL HIGH (ref 4.8–5.6)

## 2022-07-21 ENCOUNTER — Ambulatory Visit: Payer: Self-pay | Attending: Nurse Practitioner | Admitting: Physician Assistant

## 2022-07-21 ENCOUNTER — Encounter: Payer: Self-pay | Admitting: Physician Assistant

## 2022-07-21 VITALS — BP 114/76 | HR 75 | Ht 74.0 in | Wt 255.2 lb

## 2022-07-21 DIAGNOSIS — Z01810 Encounter for preprocedural cardiovascular examination: Secondary | ICD-10-CM

## 2022-07-21 DIAGNOSIS — I447 Left bundle-branch block, unspecified: Secondary | ICD-10-CM

## 2022-07-21 DIAGNOSIS — Z950 Presence of cardiac pacemaker: Secondary | ICD-10-CM

## 2022-07-21 DIAGNOSIS — I5022 Chronic systolic (congestive) heart failure: Secondary | ICD-10-CM

## 2022-07-21 DIAGNOSIS — I4821 Permanent atrial fibrillation: Secondary | ICD-10-CM

## 2022-07-21 MED ORDER — NA SULFATE-K SULFATE-MG SULF 17.5-3.13-1.6 GM/177ML PO SOLN: ORAL | 0 refills | Status: DC

## 2022-07-21 NOTE — Telephone Encounter (Signed)
Suprep has been sent to pharmacy as requested by patient. Alphonse Guild, [facility] spoke to patient (he needed [place] interpreter) to advise of cardiac clearance and to hold xarelto 2 days prior to procedure. She indicates that patient verbalizes understanding.

## 2022-07-21 NOTE — Telephone Encounter (Addendum)
Called and spoke with patient he was advised to hold his Xarelto medication two days prior to his procedure patient understood. The patient also asked about the prep medication that has not been sent. He would like for the order to go to [facility] on [address].

## 2022-07-21 NOTE — Telephone Encounter (Signed)
As per cardiology office note dated 07/21/22,   "Assessment & Plan    Preop clearance   [name] perioperative risk of a major cardiac event is 0.9% according to the Revised Cardiac Risk Index (RCRI).  Therefore, he is at low risk for perioperative complications.   His functional capacity is good at 5.87 METs according to the Duke Activity Status Index (DASI). Recommendations: According to ACC/AHA guidelines, no further cardiovascular testing needed.  The patient may proceed to surgery at acceptable risk.   Antiplatelet and/or Anticoagulation Recommendations:   Xarelto (Rivaroxaban) can be held for 2 days prior to surgery.  Please resume post op when felt to be safe.  "    Author: Juventino Burke, CMA Author Type: Certified Medical Assistant Filed: 07/21/2022  9:27 AM

## 2022-07-21 NOTE — Patient Instructions (Signed)
Medication Instructions:  Your physician recommends that you continue on your current medications as directed. Please refer to the Current Medication list given to you today.  *If you need a refill on your cardiac medications before your next appointment, please call your pharmacy*   Lab Work: None If you have labs (blood work) drawn today and your tests are completely normal, you will receive your results only by: [place] (if you have MyChart) OR A paper copy in the mail If you have any lab test that is abnormal or we need to change your treatment, we will call you to review the results.   Follow-Up: At [facility], you and your health needs are our priority.  As part of our continuing mission to provide you with exceptional heart care, we have created designated Provider Care Teams.  These Care Teams include your primary Cardiologist (physician) and Advanced Practice Providers (APPs -  Physician Assistants and Nurse Practitioners) who all work together to provide you with the care you need, when you need it.  We recommend signing up for the patient portal called "MyChart".  Sign up information is provided on this After Visit Summary.  MyChart is used to connect with patients for Virtual Visits (Telemedicine).  Patients are able to view lab/test results, encounter notes, upcoming appointments, etc.  Non-urgent messages can be sent to your provider as well.   To learn more about what you can do with MyChart, go to [email or web address].    Your next appointment:   2-3 month(s)  The format for your next appointment:   In Person  Provider:   Dr Lovena Le  Important Information About Sugar

## 2022-07-30 NOTE — Telephone Encounter (Signed)
Contacted pt using interpreter service to let him know that he can hold his xarelto for 2 days prior to his colonoscopy. Pt verbalized understanding.

## 2022-07-31 NOTE — Progress Notes (Signed)
Remote pacemaker transmission.   

## 2022-08-15 ENCOUNTER — Telehealth: Payer: Self-pay | Admitting: Internal Medicine

## 2022-08-15 MED ORDER — CICLOPIROX 8 % EX SOLN: Freq: Every day | CUTANEOUS | 11 refills | Status: AC

## 2022-08-15 NOTE — Addendum Note (Signed)
Addended by: Marcelino Duster on: 08/15/2022 05:03 PM   Modules accepted: Orders

## 2022-08-15 NOTE — Telephone Encounter (Signed)
Pt called to report [facility] pharmacy does not have ciclopirox (PENLAC) 8 % solution. He was told to check in on Tuesday 08/19/2022. Pt wants to know if rx can be sent to another pharmacy.

## 2022-08-20 ENCOUNTER — Ambulatory Visit (AMBULATORY_SURGERY_CENTER): Payer: Self-pay | Admitting: Internal Medicine

## 2022-08-20 ENCOUNTER — Encounter: Payer: Self-pay | Admitting: Internal Medicine

## 2022-08-20 VITALS — BP 120/68 | HR 75 | Temp 98.9°F | Resp 17 | Ht 74.0 in | Wt 256.0 lb

## 2022-08-20 DIAGNOSIS — Z86010 Personal history of colonic polyps: Secondary | ICD-10-CM

## 2022-08-20 DIAGNOSIS — Z09 Encounter for follow-up examination after completed treatment for conditions other than malignant neoplasm: Secondary | ICD-10-CM

## 2022-08-20 DIAGNOSIS — D122 Benign neoplasm of ascending colon: Secondary | ICD-10-CM

## 2022-08-20 MED ORDER — SODIUM CHLORIDE 0.9 % IV SOLN: 500.0000 mL | INTRAVENOUS | Status: DC

## 2022-08-20 NOTE — Progress Notes (Signed)
Called to room to assist during endoscopic procedure.  Patient ID and intended procedure confirmed with present staff. Received instructions for my participation in the procedure from the performing physician.  

## 2022-08-20 NOTE — Telephone Encounter (Signed)
Pt was notified.  

## 2022-08-20 NOTE — Progress Notes (Unsigned)
A and O x3. Report to RN. Tolerated MAC anesthesia well. 

## 2022-08-20 NOTE — Patient Instructions (Signed)
Discharge instructions given. Handouts on polyps and Diverticulosis. Resume Xarelto at prior dose today. Resume current medications. YOU HAD AN ENDOSCOPIC PROCEDURE TODAY AT [facility]:   Refer to the procedure report that was given to you for any specific questions about what was found during the examination.  If the procedure report does not answer your questions, please call your gastroenterologist to clarify.  If you requested that your care partner not be given the details of your procedure findings, then the procedure report has been included in a sealed envelope for you to review at your convenience later.  YOU SHOULD EXPECT: Some feelings of bloating in the abdomen. Passage of more gas than usual.  Walking can help get rid of the air that was put into your GI tract during the procedure and reduce the bloating. If you had a lower endoscopy (such as a colonoscopy or flexible sigmoidoscopy) you may notice spotting of blood in your stool or on the toilet paper. If you underwent a bowel prep for your procedure, you may not have a normal bowel movement for a few days.  Please Note:  You might notice some irritation and congestion in your nose or some drainage.  This is from the oxygen used during your procedure.  There is no need for concern and it should clear up in a day or so.  SYMPTOMS TO REPORT IMMEDIATELY:  Following lower endoscopy (colonoscopy or flexible sigmoidoscopy):  Excessive amounts of blood in the stool  Significant tenderness or worsening of abdominal pains  Swelling of the abdomen that is new, acute  Fever of 100F or higher   For urgent or emergent issues, a gastroenterologist can be reached at any hour by calling [phone]. Do not use MyChart messaging for urgent concerns.    DIET:  We do recommend a small meal at first, but then you may proceed to your regular diet.  Drink plenty of fluids but you should avoid alcoholic beverages for 24  hours.  ACTIVITY:  You should plan to take it easy for the rest of today and you should NOT DRIVE or use heavy machinery until tomorrow (because of the sedation medicines used during the test).    FOLLOW UP: Our staff will call the number listed on your records the next business day following your procedure.  We will call around 7:15- 8:00 am to check on you and address any questions or concerns that you may have regarding the information given to you following your procedure. If we do not reach you, we will leave a message.     If any biopsies were taken you will be contacted by phone or by letter within the next 1-3 weeks.  Please call us at [phone] if you have not heard about the biopsies in 3 weeks.    SIGNATURES/CONFIDENTIALITY: You and/or your care partner have signed paperwork which will be entered into your electronic medical record.  These signatures attest to the fact that that the information above on your After Visit Summary has been reviewed and is understood.  Full responsibility of the confidentiality of this discharge information lies with you and/or your care-partner.

## 2022-08-20 NOTE — Progress Notes (Unsigned)
GASTROENTEROLOGY PROCEDURE H&P NOTE   Primary Care Physician: Mack Hook, MD    Reason for Procedure:  History of polyps  Plan:    Colonoscopy  Patient is appropriate for endoscopic procedure(s) in the ambulatory ([place]) setting.  The nature of the procedure, as well as the risks, benefits, and alternatives were carefully and thoroughly reviewed with the patient. Ample time for discussion and questions allowed. The patient understood, was satisfied, and agreed to proceed.     HPI: Kyle Burke is a 77 y.o. male who presents for surveillance: [place].  Medical history as below.  Tolerated the prep.  No recent chest pain or shortness of breath.  No abdominal pain today.  Xarelto on hold x 2 days.  Past Medical History:  Diagnosis Date   Arthritis    "knees" (07/13/2018)   Atrial fibrillation ([place]) 2016   CAD (coronary artery disease)    Dysrhythmia    High cholesterol    Hypertension 2006   Idiopathic cardiomyopathy ([place]) 11/01/2014   Followed by Dr. 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   Refusal of blood transfusions as patient is Jehovah's Witness    Type II diabetes mellitus ([place]) 2001    Past Surgical History:  Procedure Laterality Date   BI-VENTRICULAR PACEMAKER INSERTION (CRT-P)  07/13/2018   BIV PACEMAKER INSERTION CRT-P N/A 07/13/2018   Procedure: BIV PACEMAKER INSERTION CRT-P;  Surgeon: Evans Lance, MD;  Location: [facility];  Service: Cardiovascular;  Laterality: N/A;   COLONOSCOPY WITH PROPOFOL N/A 05/14/2016   Procedure: COLONOSCOPY WITH PROPOFOL;  Surgeon: Jerene Bears, MD;  Location: [facility];  Service: Gastroenterology;  Laterality: N/A;   COLONOSCOPY WITH PROPOFOL N/A 07/09/2020   Procedure: COLONOSCOPY WITH PROPOFOL;  Surgeon: Jerene Bears, MD;  Location: [facility];  Service: Gastroenterology;  Laterality: N/A;   ESOPHAGOGASTRODUODENOSCOPY (EGD) WITH PROPOFOL N/A 05/14/2016   Procedure: ESOPHAGOGASTRODUODENOSCOPY (EGD) WITH PROPOFOL;  Surgeon: Jerene Bears, MD;  Location: [facility];  Service: Gastroenterology;  Laterality: N/A;   HEMOSTASIS CLIP PLACEMENT  07/09/2020   Procedure: HEMOSTASIS CLIP PLACEMENT;  Surgeon: Jerene Bears, MD;  Location: [facility];  Service: Gastroenterology;;   INCISE AND DRAIN ABCESS Left 1986   on leg   INGUINAL HERNIA REPAIR  01/16/2012   Procedure: LAPAROSCOPIC INGUINAL HERNIA;  Surgeon: Gayland Curry, MD,FACS;  Location: [facility];  Service: General;  Laterality: Left;  Laparoscopic incarcerated Left inguinal hernia repair with mesh, umbilical hernia repair   INGUINAL HERNIA REPAIR Left 06/20/2021   Procedure: OPEN REPAIR OF RECURENT LEFT INCARCERATED INGUINAL HERNIA WITH MESH;  Surgeon: Greer Pickerel, MD;  Location: [facility];  Service: General;  Laterality: Left;   POLYPECTOMY  07/09/2020   Procedure: POLYPECTOMY;  Surgeon: Jerene Bears, MD;  Location: [facility];  Service: Gastroenterology;;  hot snare   UMBILICAL HERNIA REPAIR  01/16/2012   Procedure: HERNIA REPAIR UMBILICAL ADULT;  Surgeon: Gayland Curry, MD,FACS;  Location: [facility];  Service: General;  Laterality: N/A;    Prior to Admission medications   Medication Sig Start Date End Date Taking? Authorizing Provider  atorvastatin (LIPITOR) 80 MG tablet TAKE 1 TABLET ('80MG'$  TOTAL) BY MOUTH ONCE A DAY 07/04/22  Yes Mack Hook, MD  carvedilol (COREG) 6.25 MG tablet TAKE 1 AND 1/2 TABLETS (9.'375MG'$ ) BY MOUTH 2 TIMES A DAY  WITH A MEAL 07/04/22  Yes Mack Hook, MD  empagliflozin (JARDIANCE) 10 MG TABS tablet Take 1 tablet (10 mg total) by mouth daily before breakfast. 07/04/22  Yes Mack Hook, MD  ferrous gluconate (FERGON) 324 MG tablet Take 1 tablet (324 mg total) by mouth daily with breakfast. 02/09/17  Yes Mack Hook, MD   furosemide (LASIX) 40 MG tablet TAKE 1.5 TABLETS BY MOUTH EVERY MORNING AND 1 TABLET EVERY EVENING 07/04/22  Yes Mack Hook, MD  losartan (COZAAR) 100 MG tablet TAKE 1/2 TABLET ('50MG'$  TOTAL) BY MOUTH AT BEDTIME 07/07/22  Yes Mack Hook, MD  acetaminophen (TYLENOL) 500 MG tablet Take 500 mg by mouth every 8 (eight) hours as needed.    [provider]  AgaMatrix Ultra-Thin Lancets MISC Check blood glucose twice daily before meals 12/29/19   Mack Hook, MD  Blood Glucose Monitoring Suppl (AGAMATRIX PRESTO PRO METER) DEVI Twice daily blood glucose checks before meals 08/15/16   Mack Hook, MD  ciclopirox [facility]) 8 % solution Apply topically at bedtime. Apply over nail and surrounding skin. Apply daily over previous coat. After seven (7) days, may remove with alcohol and continue cycle. 08/15/22   Mack Hook, MD  glucose blood (AGAMATRIX PRESTO TEST) test strip Twice daily glucose checks before meals 07/04/22   Mack Hook, MD  Glycerin-Hypromellose-PEG 400 0.2-0.2-1 % SOLN Place 2 drops into both eyes daily as needed (redness releif).    [provider]  rivaroxaban (XARELTO) 20 MG TABS tablet Take 1 tablet (20 mg total) by mouth at bedtime. 10/24/21   Mack Hook, MD  sildenafil (VIAGRA) 100 MG tablet TAKE ONE-HALF TO ONE TABLET BY MOUTH ONE-HALF HOUR BEFORE ACTIVITY. ONLY ONE TABLET MAX IN 24 HOURS 12/20/20   Mack Hook, MD  terbinafine (LAMISIL) 1 % cream Apply to toes, in between toes with flaking and feet twice daily for first 14 days using penlac 07/18/22   Mack Hook, MD    Current Outpatient Medications  Medication Sig Dispense Refill   atorvastatin (LIPITOR) 80 MG tablet TAKE 1 TABLET ('80MG'$  TOTAL) BY MOUTH ONCE A DAY 30 tablet 11   carvedilol (COREG) 6.25 MG tablet TAKE 1 AND 1/2 TABLETS (9.'375MG'$ ) BY MOUTH 2 TIMES A DAY WITH A MEAL 90 tablet 11   empagliflozin (JARDIANCE) 10 MG TABS tablet Take 1 tablet  (10 mg total) by mouth daily before breakfast. 30 tablet 11   ferrous gluconate (FERGON) 324 MG tablet Take 1 tablet (324 mg total) by mouth daily with breakfast.  3   furosemide (LASIX) 40 MG tablet TAKE 1.5 TABLETS BY MOUTH EVERY MORNING AND 1 TABLET EVERY EVENING 225 tablet 3   losartan (COZAAR) 100 MG tablet TAKE 1/2 TABLET ('50MG'$  TOTAL) BY MOUTH AT BEDTIME 15 tablet 11   acetaminophen (TYLENOL) 500 MG tablet Take 500 mg by mouth every 8 (eight) hours as needed.     AgaMatrix Ultra-Thin Lancets MISC Check blood glucose twice daily before meals 100 each 11   Blood Glucose Monitoring Suppl (AGAMATRIX PRESTO PRO METER) DEVI Twice daily blood glucose checks before meals 1 Device 0   ciclopirox (PENLAC) 8 % solution Apply topically at bedtime. Apply over nail and surrounding skin. Apply daily over previous coat. After seven (7) days, may remove with alcohol and continue cycle. 6.6 mL 11   glucose blood (AGAMATRIX PRESTO TEST) test strip Twice daily glucose checks before meals 100 each 12   Glycerin-Hypromellose-PEG 400 0.2-0.2-1 % SOLN Place 2 drops into both eyes daily as needed (redness  releif).     rivaroxaban (XARELTO) 20 MG TABS tablet Take 1 tablet (20 mg total) by mouth at bedtime. 30 tablet 11   sildenafil (VIAGRA) 100 MG tablet TAKE ONE-HALF TO ONE TABLET BY MOUTH ONE-HALF HOUR BEFORE ACTIVITY. ONLY ONE TABLET MAX IN 24 HOURS 30 tablet 4   terbinafine (LAMISIL) 1 % cream Apply to toes, in between toes with flaking and feet twice daily for first 14 days using penlac 30 g 0   Current Facility-Administered Medications  Medication Dose Route Frequency Provider Last Rate Last Admin   0.9 %  sodium chloride infusion  500 mL Intravenous Continuous Lejla Moeser, Lajuan Lines, MD        Allergies as of 08/20/2022 - Review Complete 08/20/2022  Allergen Reaction Noted   Spironolactone Other (See Comments) 08/25/2018   Other  06/07/2021    Family History  Problem Relation Age of Onset   Stroke Mother     Heart disease Mother    Heart disease Father    Diabetes Father    Kidney disease Father        , history of stones as well   Cancer Sister 68       unknown soft tissue cancer of leg   Depression Sister    Diabetes Brother    COPD Brother    Pneumonia Brother        COVID   Diabetes Brother    Varicose Veins Son    Varicose Veins Son     Social History   Socioeconomic History   Marital status: Married    Spouse name: Verdis Frederickson   Number of children: 5   Years of education: 9   Highest education level: Not on file  Occupational History   Occupation: Ambulance person constrution-retired    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  Tobacco Use   Smoking status: Never   Smokeless tobacco: Never  Vaping Use   Vaping Use: Never used  Substance and Sexual Activity   Alcohol use: Not Currently    Alcohol/week: 0.0 standard drinks of alcohol    Comment: 07/13/2018 "used to drink; none since 2015"   Drug use: Never   Sexual activity: Yes    Comment: Viagra helps a bit.  Other Topics Concern   Not on file  Social History Narrative   C[place]   Lives at home with wife, a son and his girlfriend   Social Determinants of Health   Financial Resource Strain: Low Risk  (01/08/2022)   Overall Financial Resource Strain (CARDIA)    Difficulty of Paying Living Expenses: Not hard at all  Food Insecurity: No Food Insecurity (01/08/2022)   Hunger Vital Sign    Worried About Running Out of Food in the Last Year: Never true    Ran Out of Food in the Last Year: Never true  Transportation Needs: No Transportation Needs (01/08/2022)   PRAPARE - Hydrologist (Medical): No    Lack of Transportation (Non-Medical): No  Physical Activity: Not on file  Stress: Not on file  Social Connections: Not on file  Intimate Partner Violence: Not At Risk (01/08/2022)   Humiliation, Afraid, Rape, and Kick questionnaire    Fear of Current or Ex-Partner: No     Emotionally Abused: No    Physically Abused: No    Sexually Abused: No    Physical Exam: Vital signs in last 24 hours: '@BP'$  131/72   Pulse 73  Temp 98.9 F (37.2 C)   Ht '6\' 2"'$  (1.88 m)   Wt 256 lb (116.1 kg)   SpO2 98%   BMI 32.87 kg/m  GEN: NAD EYE: Sclerae anicteric ENT: MMM CV: Non-tachycardic Pulm: CTA b/l GI: Soft, NT/ND NEURO:  Alert & Oriented x 3   Zenovia Jarred, MD [facility]  08/20/2022 3:47 PM

## 2022-08-20 NOTE — Progress Notes (Unsigned)
Due to language barrier, an interpreter was present and it assited   Kyle Burke

## 2022-08-20 NOTE — Op Note (Signed)
[facility] Patient Name: Yuji Walth Procedure Date: 08/20/2022 3:49 PM MRN: [id number] Endoscopist: Jerene Bears , MD, [id number] Age: 77 Referring MD:  Date of Birth: Apr 14, 1946 Gender: Male Account #: [id number] Procedure:                Colonoscopy Indications:              High risk colon cancer surveillance: Personal                            history of multiple (3 or more) adenomas, Last                            colonoscopy: November 2021 (incomplete due to                            inguinal hernia since repaired) Medicines:                Monitored Anesthesia Care Procedure:                Pre-Anesthesia Assessment:                           - Prior to the procedure, a History and Physical                            was performed, and patient medications and                            allergies were reviewed. The patient's tolerance of                            previous anesthesia was also reviewed. The risks                            and benefits of the procedure and the sedation                            options and risks were discussed with the patient.                            All questions were answered, and informed consent                            was obtained. Prior Anticoagulants: The patient has                            taken Xarelto (rivaroxaban), last dose was 2 days                            prior to procedure. ASA Grade Assessment: III - A                            patient with severe systemic disease. After  reviewing the risks and benefits, the patient was                            deemed in satisfactory condition to undergo the                            procedure.                           After obtaining informed consent, the colonoscope                            was passed under direct vision. Throughout the                            procedure, the patient's blood pressure, pulse, and                             oxygen saturations were monitored continuously. The                            CF [id number] #[id number] was introduced through the anus                            and advanced to the cecum, identified by                            appendiceal orifice and ileocecal valve. The                            colonoscopy was performed without difficulty. The                            patient tolerated the procedure well. The quality                            of the bowel preparation was good. The ileocecal                            valve, appendiceal orifice, and rectum were                            photographed. Scope In: 3:59:34 PM Scope Out: 4:12:52 PM Scope Withdrawal Time: 0 hours 9 minutes 55 seconds  Total Procedure Duration: 0 hours 13 minutes 18 seconds  Findings:                 The digital rectal exam was normal.                           A 3 mm polyp was found in the ascending colon. The                            polyp was sessile. The polyp was removed with a  cold snare. Resection and retrieval were complete.                           Multiple large-mouthed and small-mouthed                            diverticula were found in the sigmoid colon and                            descending colon.                           The exam was otherwise without abnormality on                            direct and retroflexion views. Complications:            No immediate complications. Estimated Blood Loss:     Estimated blood loss: none. Impression:               - One 3 mm polyp in the ascending colon, removed                            with a cold snare. Resected and retrieved.                           - Mild diverticulosis in the sigmoid colon and in                            the descending colon.                           - The examination was otherwise normal on direct                            and retroflexion views. Recommendation:            - Patient has a contact number available for                            emergencies. The signs and symptoms of potential                            delayed complications were discussed with the                            patient. Return to normal activities tomorrow.                            Written discharge instructions were provided to the                            patient.                           - Resume previous diet.                           -  Continue present medications.                           - Resume Xarelto (rivaroxaban) at prior dose today.                            Refer to managing physician for further adjustment                            of therapy.                           - Await pathology results.                           - No repeat colonoscopy due to age at next                            surveillance interval. Jerene Bears, MD 08/20/2022 4:16:22 PM This report has been signed electronically.

## 2022-08-20 NOTE — Progress Notes (Unsigned)
Patient reports no changes to health or medications since pre visit. 

## 2022-08-21 ENCOUNTER — Telehealth: Payer: Self-pay | Admitting: *Deleted

## 2022-08-21 NOTE — Telephone Encounter (Signed)
  Follow up Call-     08/20/2022    3:14 PM  Call back number  Post procedure Call Back phone  # [phone] patient's son Woodroe Mode  Permission to leave phone message Yes     Patient questions:  Do you have a fever, pain , or abdominal swelling? No. Pain Score  0 *  Have you tolerated food without any problems? Yes.    Have you been able to return to your normal activities? Yes.    Do you have any questions about your discharge instructions: Diet   No. Medications  No. Follow up visit  No.  Do you have questions or concerns about your Care? No.  Actions: * If pain score is 4 or above: No action needed, pain <4.

## 2022-08-28 ENCOUNTER — Encounter: Payer: Self-pay | Admitting: Internal Medicine

## 2022-09-22 ENCOUNTER — Other Ambulatory Visit: Payer: Self-pay

## 2022-09-22 MED ORDER — RIVAROXABAN 20 MG PO TABS: 20.0000 mg | ORAL_TABLET | Freq: Every day | ORAL | 11 refills | Status: DC

## 2022-10-02 ENCOUNTER — Ambulatory Visit: Payer: MEDICAID | Attending: Internal Medicine | Admitting: Internal Medicine

## 2022-10-02 ENCOUNTER — Encounter: Payer: Self-pay | Admitting: Internal Medicine

## 2022-10-02 VITALS — BP 110/80 | HR 70 | Ht 75.0 in | Wt 250.4 lb

## 2022-10-02 DIAGNOSIS — I5022 Chronic systolic (congestive) heart failure: Secondary | ICD-10-CM

## 2022-10-02 NOTE — Patient Instructions (Signed)
Medication Instructions:  Your physician recommends that you continue on your current medications as directed. Please refer to the Current Medication list given to you today.  *If you need a refill on your cardiac medications before your next appointment, please call your pharmacy*   Lab Work: None If you have labs (blood work) drawn today and your tests are completely normal, you will receive your results only by: [place] (if you have MyChart) OR A paper copy in the mail If you have any lab test that is abnormal or we need to change your treatment, we will call you to review the results.   Follow-Up: At [facility], you and your health needs are our priority.  As part of our continuing mission to provide you with exceptional heart care, we have created designated Provider Care Teams.  These Care Teams include your primary Cardiologist (physician) and Advanced Practice Providers (APPs -  Physician Assistants and Nurse Practitioners) who all work together to provide you with the care you need, when you need it.  We recommend signing up for the patient portal called "MyChart".  Sign up information is provided on this After Visit Summary.  MyChart is used to connect with patients for Virtual Visits (Telemedicine).  Patients are able to view lab/test results, encounter notes, upcoming appointments, etc.  Non-urgent messages can be sent to your provider as well.   To learn more about what you can do with MyChart, go to [email or web address].    Your next appointment:   1 year(s)  Provider:   Cristopher Peru, MD

## 2022-10-02 NOTE — Progress Notes (Signed)
HPI Kyle Burke returns today for followup. He has a h/o chronic systolic heart failure, LBBB, chronic atrial fib, and is s/p Biv PPM insertion. He received a His bundle lead in the atrial port, an LV and RV leads and his EF has improved from 15% to 25%. He has class 2 symptoms. He has not had syncope and denies peripheral edema, chest pain or sob.   Allergies  Allergen Reactions   Spironolactone Other (See Comments)    Gynecomastia   Other     REFUSE BLOOD PRODUCTS     Current Outpatient Medications  Medication Sig Dispense Refill   acetaminophen (TYLENOL) 500 MG tablet Take 500 mg by mouth every 8 (eight) hours as needed.     AgaMatrix Ultra-Thin Lancets MISC Check blood glucose twice daily before meals 100 each 11   atorvastatin (LIPITOR) 80 MG tablet TAKE 1 TABLET (80MG TOTAL) BY MOUTH ONCE A DAY 30 tablet 11   Blood Glucose Monitoring Suppl (AGAMATRIX PRESTO PRO METER) DEVI Twice daily blood glucose checks before meals 1 Device 0   carvedilol (COREG) 6.25 MG tablet TAKE 1 AND 1/2 TABLETS (9.375MG) BY MOUTH 2 TIMES A DAY WITH A MEAL 90 tablet 11   ciclopirox (PENLAC) 8 % solution Apply topically at bedtime. Apply over nail and surrounding skin. Apply daily over previous coat. After seven (7) days, may remove with alcohol and continue cycle. 6.6 mL 11   empagliflozin (JARDIANCE) 10 MG TABS tablet Take 1 tablet (10 mg total) by mouth daily before breakfast. 30 tablet 11   ferrous gluconate (FERGON) 324 MG tablet Take 1 tablet (324 mg total) by mouth daily with breakfast.  3   furosemide (LASIX) 40 MG tablet TAKE 1.5 TABLETS BY MOUTH EVERY MORNING AND 1 TABLET EVERY EVENING 225 tablet 3   glucose blood (AGAMATRIX PRESTO TEST) test strip Twice daily glucose checks before meals 100 each 12   Glycerin-Hypromellose-PEG 400 0.2-0.2-1 % SOLN Place 2 drops into both eyes daily as needed (redness releif).     losartan (COZAAR) 100 MG tablet TAKE 1/2 TABLET (50MG TOTAL) BY MOUTH AT BEDTIME  15 tablet 11   rivaroxaban (XARELTO) 20 MG TABS tablet Take 1 tablet (20 mg total) by mouth at bedtime. 30 tablet 11   sildenafil (VIAGRA) 100 MG tablet TAKE ONE-HALF TO ONE TABLET BY MOUTH ONE-HALF HOUR BEFORE ACTIVITY. ONLY ONE TABLET MAX IN 24 HOURS 30 tablet 4   terbinafine (LAMISIL) 1 % cream Apply to toes, in between toes with flaking and feet twice daily for first 14 days using penlac 30 g 0   No current facility-administered medications for this visit.     Past Medical History:  Diagnosis Date   Arthritis    "knees" (07/13/2018)   Atrial fibrillation ([place]) 2016   CAD (coronary artery disease)    Dysrhythmia    High cholesterol    Hypertension 2006   Idiopathic cardiomyopathy ([place]) 11/01/2014   Followed by Dr. 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   Refusal of blood transfusions as patient is Jehovah's Witness    Type II diabetes mellitus ([place]) 2001    ROS:   All systems reviewed and negative except as noted in the HPI.   Past Surgical History:  Procedure Laterality Date   BI-VENTRICULAR PACEMAKER INSERTION (CRT-P)  07/13/2018  BIV PACEMAKER INSERTION CRT-P N/A 07/13/2018   Procedure: BIV PACEMAKER INSERTION CRT-P;  Surgeon: Evans Lance, MD;  Location: [facility];  Service: Cardiovascular;  Laterality: N/A;   COLONOSCOPY WITH PROPOFOL N/A 05/14/2016   Procedure: COLONOSCOPY WITH PROPOFOL;  Surgeon: Jerene Bears, MD;  Location: [facility];  Service: Gastroenterology;  Laterality: N/A;   COLONOSCOPY WITH PROPOFOL N/A 07/09/2020   Procedure: COLONOSCOPY WITH PROPOFOL;  Surgeon: Jerene Bears, MD;  Location: [facility];  Service: Gastroenterology;  Laterality: N/A;   ESOPHAGOGASTRODUODENOSCOPY (EGD) WITH PROPOFOL N/A 05/14/2016   Procedure: ESOPHAGOGASTRODUODENOSCOPY (EGD) WITH PROPOFOL;  Surgeon: Jerene Bears, MD;  [facility]: [facility];  Service: Gastroenterology;  Laterality: N/A;   HEMOSTASIS CLIP PLACEMENT  07/09/2020   Procedure: HEMOSTASIS CLIP PLACEMENT;  Surgeon: Jerene Bears, MD;  [facility]: [facility];  Service: Gastroenterology;;   INCISE AND DRAIN ABCESS Left 1986   on leg   INGUINAL HERNIA REPAIR  01/16/2012   Procedure: LAPAROSCOPIC INGUINAL HERNIA;  Surgeon: Gayland Curry, MD,FACS;  Location: [facility];  Service: General;  Laterality: Left;  Laparoscopic incarcerated Left inguinal hernia repair with mesh, umbilical hernia repair   INGUINAL HERNIA REPAIR Left 06/20/2021   Procedure: OPEN REPAIR OF RECURENT LEFT INCARCERATED INGUINAL HERNIA WITH MESH;  Surgeon: Greer Pickerel, MD;  Location: [facility];  Service: General;  Laterality: Left;   POLYPECTOMY  07/09/2020   Procedure: POLYPECTOMY;  Surgeon: Jerene Bears, MD;  Location: [facility];  Service: Gastroenterology;;  hot snare   UMBILICAL HERNIA REPAIR  01/16/2012   Procedure: HERNIA REPAIR UMBILICAL ADULT;  Surgeon: Gayland Curry, MD,FACS;  Location: [facility];  Service: General;  Laterality: N/A;     Family History  Problem Relation Age of Onset   Stroke Mother    Heart disease Mother    Heart disease Father    Diabetes Father    Kidney disease Father        , history of stones as well   Cancer Sister 33       unknown soft tissue cancer of leg   Depression Sister    Diabetes Brother    COPD Brother    Pneumonia Brother        COVID   Diabetes Brother    Varicose Veins Son    Varicose Veins Son      Social History   Socioeconomic History   Marital status: Married    Spouse name: Verdis Frederickson   Number of children: 5   Years of education: 9   Highest education level: Not on file  Occupational History   Occupation: Ambulance person constrution-retired    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  Tobacco Use   Smoking status: Never   Smokeless tobacco: Never  Vaping Use   Vaping Use: Never used   Substance and Sexual Activity   Alcohol use: Not Currently    Alcohol/week: 0.0 standard drinks of alcohol    Comment: 07/13/2018 "used to drink; none since 2015"   Drug use: Never   Sexual activity: Yes    Comment: Viagra helps a bit.  Other Topics Concern   Not on file  Social History Narrative   [place]   Lives at home with wife, a son and his girlfriend   Social Determinants of Health   Financial Resource Strain: Low Risk  (01/08/2022)   Overall Financial Resource Strain (CARDIA)    Difficulty of Paying Living Expenses: Not hard  at all  Food Insecurity: No Food Insecurity (01/08/2022)   Hunger Vital Sign    Worried About Running Out of Food in the Last Year: Never true    Ran Out of Food in the Last Year: Never true  Transportation Needs: No Transportation Needs (01/08/2022)   PRAPARE - Hydrologist (Medical): No    Lack of Transportation (Non-Medical): No  Physical Activity: Not on file  Stress: Not on file  Social Connections: Not on file  Intimate Partner Violence: Not At Risk (01/08/2022)   Humiliation, Afraid, Rape, and Kick questionnaire    Fear of Current or Ex-Partner: No    Emotionally Abused: No    Physically Abused: No    Sexually Abused: No     BP 110/80   Pulse 70   Ht 6' 3"$  (1.905 m)   Wt 250 lb 6.4 oz (113.6 kg)   SpO2 98%   BMI 31.30 kg/m   Physical Exam:  Well appearing NAD HEENT: Unremarkable Neck:  No JVD, no thyromegally Lymphatics:  No adenopathy Back:  No CVA tenderness Lungs:  Clear HEART:  Regular rate rhythm, no murmurs, no rubs, no clicks Abd:  soft, positive bowel sounds, no organomegally, no rebound, no guarding Ext:  2 plus pulses, no edema, no cyanosis, no clubbing Skin:  No rashes no nodules Neuro:  CN II through XII intact, motor grossly intact  DEVICE  Normal device function.  See PaceArt for details. 1.5 years to ERI.   Assess/Plan:  1. Chronic atrial fib - he is well  controlled after PPM insertion 2. PPM - his biv PPM is working normally.  3. Chronic systolic heart failure - his symptoms remain class 2. He will follow. 4. Dyslipidemia - he will continue lipitor. No change.   Carleene Overlie Javani Spratt,MD

## 2022-10-08 ENCOUNTER — Ambulatory Visit (INDEPENDENT_AMBULATORY_CARE_PROVIDER_SITE_OTHER): Payer: Self-pay

## 2022-10-08 DIAGNOSIS — I447 Left bundle-branch block, unspecified: Secondary | ICD-10-CM

## 2022-10-08 LAB — CUP PACEART REMOTE DEVICE CHECK
Battery Remaining Longevity: 18 mo
Battery Remaining Percentage: 25 %
Battery Voltage: 2.92 V
Brady Statistic AP VP Percent: 88 %
Brady Statistic AP VS Percent: 0 %
Brady Statistic AS VP Percent: 1 %
Brady Statistic AS VS Percent: 1 %
Brady Statistic RA Percent Paced: 80 %
Date Time Interrogation Session: 20240221020010
Implantable Lead Connection Status: 753985
Implantable Lead Connection Status: 753985
Implantable Lead Connection Status: 753985
Implantable Lead Implant Date: 20191126
Implantable Lead Implant Date: 20191126
Implantable Lead Implant Date: 20191126
Implantable Lead Location: 753858
Implantable Lead Location: 753860
Implantable Lead Location: 753860
Implantable Lead Model: 3830
Implantable Pulse Generator Implant Date: 20191126
Lead Channel Impedance Value: 440 Ohm
Lead Channel Impedance Value: 540 Ohm
Lead Channel Impedance Value: 660 Ohm
Lead Channel Pacing Threshold Amplitude: 0.625 V
Lead Channel Pacing Threshold Amplitude: 0.75 V
Lead Channel Pacing Threshold Amplitude: 1 V
Lead Channel Pacing Threshold Pulse Width: 0.5 ms
Lead Channel Pacing Threshold Pulse Width: 0.5 ms
Lead Channel Pacing Threshold Pulse Width: 1 ms
Lead Channel Sensing Intrinsic Amplitude: 1 mV
Lead Channel Sensing Intrinsic Amplitude: 10.8 mV
Lead Channel Setting Pacing Amplitude: 2 V
Lead Channel Setting Pacing Amplitude: 2 V
Lead Channel Setting Pacing Amplitude: 2.5 V
Lead Channel Setting Pacing Pulse Width: 0.5 ms
Lead Channel Setting Pacing Pulse Width: 0.5 ms
Lead Channel Setting Sensing Sensitivity: 0.7 mV
Pulse Gen Model: 3562
Pulse Gen Serial Number: 9060242

## 2022-11-06 NOTE — Progress Notes (Signed)
Remote pacemaker transmission.   

## 2022-12-01 DIAGNOSIS — B351 Tinea unguium: Secondary | ICD-10-CM | POA: Insufficient documentation

## 2023-01-07 ENCOUNTER — Ambulatory Visit (INDEPENDENT_AMBULATORY_CARE_PROVIDER_SITE_OTHER): Payer: Self-pay

## 2023-01-07 DIAGNOSIS — I5022 Chronic systolic (congestive) heart failure: Secondary | ICD-10-CM

## 2023-01-07 LAB — CUP PACEART REMOTE DEVICE CHECK
Battery Remaining Longevity: 14 mo
Battery Remaining Percentage: 20 %
Battery Voltage: 2.9 V
Brady Statistic AP VP Percent: 86 %
Brady Statistic AP VS Percent: 0 %
Brady Statistic AS VP Percent: 1 %
Brady Statistic AS VS Percent: 1 %
Brady Statistic RA Percent Paced: 77 %
Date Time Interrogation Session: 20240522020011
Implantable Lead Connection Status: 753985
Implantable Lead Connection Status: 753985
Implantable Lead Connection Status: 753985
Implantable Lead Implant Date: 20191126
Implantable Lead Implant Date: 20191126
Implantable Lead Implant Date: 20191126
Implantable Lead Location: 753858
Implantable Lead Location: 753860
Implantable Lead Location: 753860
Implantable Lead Model: 3830
Implantable Pulse Generator Implant Date: 20191126
Lead Channel Impedance Value: 440 Ohm
Lead Channel Impedance Value: 530 Ohm
Lead Channel Impedance Value: 660 Ohm
Lead Channel Pacing Threshold Amplitude: 0.75 V
Lead Channel Pacing Threshold Amplitude: 0.75 V
Lead Channel Pacing Threshold Amplitude: 1 V
Lead Channel Pacing Threshold Pulse Width: 0.5 ms
Lead Channel Pacing Threshold Pulse Width: 0.5 ms
Lead Channel Pacing Threshold Pulse Width: 1 ms
Lead Channel Sensing Intrinsic Amplitude: 1 mV
Lead Channel Sensing Intrinsic Amplitude: 10.3 mV
Lead Channel Setting Pacing Amplitude: 2 V
Lead Channel Setting Pacing Amplitude: 2 V
Lead Channel Setting Pacing Amplitude: 2.5 V
Lead Channel Setting Pacing Pulse Width: 0.5 ms
Lead Channel Setting Pacing Pulse Width: 0.5 ms
Lead Channel Setting Sensing Sensitivity: 0.7 mV
Pulse Gen Model: 3562
Pulse Gen Serial Number: 9060242

## 2023-01-15 ENCOUNTER — Encounter: Payer: Self-pay | Admitting: Internal Medicine

## 2023-01-15 ENCOUNTER — Ambulatory Visit: Payer: Self-pay | Admitting: Internal Medicine

## 2023-01-15 VITALS — BP 120/82 | HR 76 | Resp 16 | Ht 75.0 in | Wt 248.0 lb

## 2023-01-15 DIAGNOSIS — Z0000 Encounter for general adult medical examination without abnormal findings: Secondary | ICD-10-CM

## 2023-01-15 DIAGNOSIS — E785 Hyperlipidemia, unspecified: Secondary | ICD-10-CM

## 2023-01-15 DIAGNOSIS — M170 Bilateral primary osteoarthritis of knee: Secondary | ICD-10-CM

## 2023-01-15 DIAGNOSIS — Z79899 Other long term (current) drug therapy: Secondary | ICD-10-CM

## 2023-01-15 DIAGNOSIS — E1122 Type 2 diabetes mellitus with diabetic chronic kidney disease: Secondary | ICD-10-CM

## 2023-01-15 DIAGNOSIS — Z125 Encounter for screening for malignant neoplasm of prostate: Secondary | ICD-10-CM

## 2023-01-15 DIAGNOSIS — N183 Chronic kidney disease, stage 3 unspecified: Secondary | ICD-10-CM

## 2023-01-15 DIAGNOSIS — D649 Anemia, unspecified: Secondary | ICD-10-CM

## 2023-01-15 DIAGNOSIS — R7989 Other specified abnormal findings of blood chemistry: Secondary | ICD-10-CM

## 2023-01-15 MED ORDER — AGAMATRIX ULTRA-THIN LANCETS MISC: 11 refills | Status: AC

## 2023-01-15 MED ORDER — AGAMATRIX PRESTO TEST VI STRP: ORAL_STRIP | 12 refills | Status: AC

## 2023-01-15 NOTE — Progress Notes (Signed)
Subjective:    Patient ID: Kyle Burke, male   DOB: 04-05-1946, 77 y.o.   MRN: [id number]   HPI  Tereasa Coop interprets  Here for Male CPE:  1.  STE:  Does perform.  No concerning findings and no new hernias.    2.  PSA: Last 01/24/2022 and normal at 2.0.  No family history of prostate.  3.  Guaiac Cards/FIT:  Last 01/14/2022 and negative.  4.  Colonoscopy: Last performed with Dr. Rhea Belton, [place]. 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  No recommendation for further surveillance due to age, though reconsider if new bowel habits, abdominal pain, blood in stool--shared with patient as he did not see letter out.    5.  Cholesterol/Glucose:  Cholesterol panel in December 2023 was at goal save for HDL.  A1C was at goal in December also at 6.5%.  Does check sugars and generally around 114.  Only checks in morning fasting.    Lipid Panel     Component Value Date/Time   CHOL 111 07/18/2022 1104   TRIG 66 07/18/2022 1104   HDL 41 07/18/2022 1104   CHOLHDL 4.0 09/15/2014 1037   VLDL 12 09/15/2014 1037   LDLCALC 56 07/18/2022 1104   LABVLDL 14 07/18/2022 1104     6.  Immunizations:  Immunization History  Administered Date(s) Administered   Covid-19, Mrna,Vaccine(Spikevax)32yrs and older 07/18/2022   Influenza, High Dose Seasonal PF 10/26/2015, 05/05/2022   Influenza,inj,Quad PF,6+ Mos 05/17/2017, 06/11/2018, 06/03/2019   Influenza-Unspecified 05/16/2017, 06/02/2019, 04/09/2020, 05/20/2021   Moderna Covid-19 Vaccine Bivalent Booster 45yrs & up 01/08/2022   Moderna SARS-COV2 Booster Vaccination 06/25/2020, 12/12/2020   Moderna Sars-Covid-2 Vaccination 10/10/2019, 11/08/2019   Pneumococcal Conjugate-13 06/30/2019   Pneumococcal Polysaccharide-23 08/21/2016, 01/08/2022   Tdap 05/08/2016   Zoster Recombinat (Shingrix) 07/30/2021, 05/28/2022     7.  Other:  Has an OTC med from [place] Artribion Vitaminado, which is a combination of diclofenac and B vitamin mix.    Current  Meds  Medication Sig   acetaminophen (TYLENOL) 500 MG tablet Take 500 mg by mouth every 8 (eight) hours as needed.   AgaMatrix Ultra-Thin Lancets MISC Check blood glucose twice daily before meals   atorvastatin (LIPITOR) 80 MG tablet TAKE 1 TABLET (80MG  TOTAL) BY MOUTH ONCE A DAY   Blood Glucose Monitoring Suppl (AGAMATRIX PRESTO PRO METER) DEVI Twice daily blood glucose checks before meals   carvedilol (COREG) 6.25 MG tablet TAKE 1 AND 1/2 TABLETS (9.375MG ) BY MOUTH 2 TIMES A DAY WITH A MEAL   ciclopirox (PENLAC) 8 % solution Apply topically at bedtime. Apply over nail and surrounding skin. Apply daily over previous coat. After seven (7) days, may remove with alcohol and continue cycle.   empagliflozin (JARDIANCE) 10 MG TABS tablet Take 1 tablet (10 mg total) by mouth daily before breakfast.   ferrous gluconate (FERGON) 324 MG tablet Take 1 tablet (324 mg total) by mouth daily with breakfast.   furosemide (LASIX) 40 MG tablet TAKE 1.5 TABLETS BY MOUTH EVERY MORNING AND 1 TABLET EVERY EVENING   glucose blood (AGAMATRIX PRESTO TEST) test strip Twice daily glucose checks before meals   Glycerin-Hypromellose-PEG 400 0.2-0.2-1 % SOLN Place 2 drops into both eyes daily as needed (redness releif).   losartan (COZAAR) 100 MG tablet TAKE 1/2 TABLET (50MG  TOTAL) BY MOUTH AT BEDTIME   rivaroxaban (XARELTO) 20 MG TABS tablet Take 1 tablet (20 mg total) by mouth at bedtime.   sildenafil (VIAGRA) 100 MG tablet TAKE ONE-HALF TO  ONE TABLET BY MOUTH ONE-HALF HOUR BEFORE ACTIVITY. ONLY ONE TABLET MAX IN 24 HOURS   Allergies  Allergen Reactions   Spironolactone Other (See Comments)    Gynecomastia   Other     REFUSE BLOOD PRODUCTS   Past Medical History:  Diagnosis Date   Atrial fibrillation ([facility]) 2016   CAD (coronary artery disease)    High cholesterol    Hypertension 2006   Idiopathic cardiomyopathy ([facility]) 11/01/2014   Followed by Dr. Elease Hashimoto, Cardiology  EF 25%  Adenosine Cardiolite did not support  ischemic etiology   OA (osteoarthritis) of knee 01/08/2017   Pleural effusion    Presence of permanent cardiac pacemaker 07/13/2018   Refusal of blood transfusions as patient is Jehovah's Witness    Type II diabetes mellitus ([facility]) 2001   Past Surgical History:  Procedure Laterality Date   BI-VENTRICULAR PACEMAKER INSERTION (CRT-P)  07/13/2018   BIV PACEMAKER INSERTION CRT-P N/A 07/13/2018   Procedure: BIV PACEMAKER INSERTION CRT-P;  Surgeon: Marinus Maw, MD;  Location: [facility];  Service: Cardiovascular;  Laterality: N/A;   COLONOSCOPY WITH PROPOFOL N/A 05/14/2016   Procedure: COLONOSCOPY WITH PROPOFOL;  Surgeon: Beverley Fiedler, MD;  [facility];  Service: Gastroenterology;  Laterality: N/A;   COLONOSCOPY WITH PROPOFOL N/A 07/09/2020   Procedure: COLONOSCOPY WITH PROPOFOL;  Surgeon: Beverley Fiedler, MD;  [facility]: [facility];  Service: Gastroenterology;  Laterality: N/A;   ESOPHAGOGASTRODUODENOSCOPY (EGD) WITH PROPOFOL N/A 05/14/2016   Procedure: ESOPHAGOGASTRODUODENOSCOPY (EGD) WITH PROPOFOL;  Surgeon: Beverley Fiedler, MD;  [facility];  Service: Gastroenterology;  Laterality: N/A;   HEMOSTASIS CLIP PLACEMENT  07/09/2020   Procedure: HEMOSTASIS CLIP PLACEMENT;  Surgeon: Beverley Fiedler, MD;  [facility];  Service: Gastroenterology;;   INCISE AND DRAIN ABCESS Left 1986   on leg   INGUINAL HERNIA REPAIR  01/16/2012   Procedure: LAPAROSCOPIC INGUINAL HERNIA;  Surgeon: Atilano Ina, MD,FACS;  Location: [facility];  Service: General;  Laterality: Left;  Laparoscopic incarcerated Left inguinal hernia repair with mesh, umbilical hernia repair   INGUINAL HERNIA REPAIR Left 06/20/2021   Procedure: OPEN REPAIR OF RECURENT LEFT INCARCERATED INGUINAL HERNIA WITH MESH;  Surgeon: Gaynelle Adu, MD;  Location: [facility];  Service: General;  Laterality: Left;   POLYPECTOMY  07/09/2020   Procedure: POLYPECTOMY;  Surgeon: Beverley Fiedler, MD;  Location: [facility];  Service:  Gastroenterology;;  hot snare   UMBILICAL HERNIA REPAIR  01/16/2012   Procedure: HERNIA REPAIR UMBILICAL ADULT;  Surgeon: Atilano Ina, MD,FACS;  Location: [facility];  Service: General;  Laterality: N/A;   Family History  Problem Relation Age of Onset   Stroke Mother    Heart disease Mother    Heart disease Father    Diabetes Father    Kidney disease Father        , history of stones as well   Cancer Sister 75       unknown soft tissue cancer of leg   Depression Sister    Pneumonia Brother 34       COVID   Diabetes Brother    COPD Brother    Diabetes Brother    Diabetes Daughter    Varicose Veins Son    Diabetes Son        prediabetes   Varicose Veins Son    Social History   Socioeconomic History   Marital status: Married    Spouse name: Byrd Hesselbach   Number of children: 5  Years of education: 9   Highest education level: Not on file  Occupational History   Occupation: Concrete constrution-retired    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  Tobacco Use   Smoking status: Never    Passive exposure: Never   Smokeless tobacco: Never  Vaping Use   Vaping Use: Never used  Substance and Sexual Activity   Alcohol use: Not Currently    Alcohol/week: 0.0 standard drinks of alcohol    Comment: 07/13/2018 "used to drink; none since 2015"   Drug use: Never   Sexual activity: Yes    Comment: Viagra helps a bit.  Other Topics Concern   Not on file  Social History Narrative   [place]   Lives at home with wife, a son, Jiles Garter, and his girlfriend   Social Determinants of Health   Financial Resource Strain: Low Risk  (01/15/2023)   Overall Financial Resource Strain (CARDIA)    Difficulty of Paying Living Expenses: Not very hard  Food Insecurity: No Food Insecurity (01/15/2023)   Hunger Vital Sign    Worried About Running Out of Food in the Last Year: Never true    Ran Out of Food in the Last Year: Never true  Transportation Needs: No Transportation  Needs (01/15/2023)   PRAPARE - Administrator, Civil Service (Medical): No    Lack of Transportation (Non-Medical): No  Physical Activity: Not on file  Stress: Not on file  Social Connections: Not on file  Intimate Partner Violence: Not At Risk (01/15/2023)   Humiliation, Afraid, Rape, and Kick questionnaire    Fear of Current or Ex-Partner: No    Emotionally Abused: No    Physically Abused: No    Sexually Abused: No     Review of Systems  Eyes:  Negative for visual disturbance (Has not had diabetic eye exam.).  Respiratory:  Negative for shortness of breath.   Cardiovascular:  Negative for chest pain, palpitations (Has pacemaker check maybe 3 times yearly.  May need battery change in next year.) and leg swelling.  Gastrointestinal:  Negative for abdominal pain and blood in stool (No melena).  Genitourinary:  Negative for decreased urine volume and dysuria.  Musculoskeletal:  Positive for arthralgias (Severe OA of knees.  He has not been interested in pursuing knee replacements.  Joint injections have not been helpful for him in past--no interest to try again.).  Neurological:  Negative for weakness and numbness.  Psychiatric/Behavioral:  Negative for dysphoric mood. The patient is not nervous/anxious.       Objective:   BP 120/82 (BP Location: Left Arm, Patient Position: Sitting, Cuff Size: Normal)   Pulse 76   Resp 16   Ht 6\' 3"  (1.905 m)   Wt 248 lb (112.5 kg)   BMI 31.00 kg/m   Physical Exam HENT:     Head: Normocephalic and atraumatic.     Right Ear: Tympanic membrane, ear canal and external ear normal.     Left Ear: Tympanic membrane, ear canal and external ear normal.     Nose: Nose normal.     Mouth/Throat:     Mouth: Mucous membranes are moist.     Pharynx: Oropharynx is clear.  Eyes:     Extraocular Movements: Extraocular movements intact.     Conjunctiva/sclera: Conjunctivae normal.     Pupils: Pupils are equal, round, and reactive to light.      Comments: Bilateral cataracts  Neck:  Thyroid: No thyroid mass or thyromegaly.  Cardiovascular:     Rate and Rhythm: Normal rate and regular rhythm.     Pulses:          Dorsalis pedis pulses are 2+ on the right side and 2+ on the left side.       Posterior tibial pulses are 2+ on the right side and 2+ on the left side.     Heart sounds: S1 normal and S2 normal. No murmur heard.    No friction rub. No S3 or S4 sounds.     Comments: Diffuse LE varices and spider veins. Pulmonary:     Effort: Pulmonary effort is normal.     Breath sounds: Normal breath sounds and air entry.  Abdominal:     General: Bowel sounds are normal.     Palpations: Abdomen is soft. There is no hepatomegaly, splenomegaly or mass.     Tenderness: There is no abdominal tenderness.     Hernia: No hernia is present.  Genitourinary:    Penis: Uncircumcised.      Testes:        Right: Mass or tenderness not present. Right testis is descended.        Left: Mass or tenderness not present. Left testis is descended.  Musculoskeletal:        General: Deformity (Significant varus deformity of both knees with hypertrophic change bilaterally.) present. Normal range of motion.     Cervical back: Normal range of motion and neck supple.     Right lower leg: 1+ Pitting Edema present.     Left lower leg: 1+ Pitting Edema present.  Feet:     Right foot:     Protective Sensation: 10 sites tested.  10 sites sensed.     Toenail Condition: Right toenails are abnormally thick.     Left foot:     Protective Sensation: 10 sites tested.  10 sites sensed.     Toenail Condition: Left toenails are abnormally thick.  Lymphadenopathy:     Head:     Right side of head: No submental or submandibular adenopathy.     Left side of head: No submental or submandibular adenopathy.     Cervical: No cervical adenopathy.     Upper Body:     Right upper body: No supraclavicular adenopathy.     Left upper body: No supraclavicular adenopathy.      Lower Body: No right inguinal adenopathy. No left inguinal adenopathy.  Skin:    General: Skin is warm.     Capillary Refill: Capillary refill takes less than 2 seconds.     Findings: No rash.  Neurological:     General: No focal deficit present.     Mental Status: He is alert and oriented to person, place, and time.     Cranial Nerves: Cranial nerves 2-12 are intact.     Sensory: Sensation is intact.     Motor: Motor function is intact.     Coordination: Coordination is intact.     Gait: Gait is intact.     Deep Tendon Reflexes: Reflexes are normal and symmetric.  Psychiatric:        Mood and Affect: Mood normal.        Speech: Speech normal.        Behavior: Behavior normal. Behavior is cooperative.      Assessment & Plan   CPE No need for FIT with recent colonoscopy Vaccines current. CBC, CMP, A1C, FLP, PSA, Urine  microalbumin/crea  2.  Idiopathic cardiomyopathy:  compensated since biventricular pacemaker placement.  Doing well  3.  OA bilateral knees:  He has fear of total knee replacement surgery, but encouraged him to discuss risk with his cardiologist in August.  Doubt he will change his mind.  Encouraged him not to use the OTC diclofenac. He will continue with tylenol.  4.  Hypertension:  controlled  5.  DM:  has been controlled.  Labs  6.  Dyslipidemia:  has been controlled.  Labs.

## 2023-01-16 LAB — CBC WITH DIFFERENTIAL/PLATELET
Basophils Absolute: 0 10*3/uL (ref 0.0–0.2)
Basos: 1 %
EOS (ABSOLUTE): 0.1 10*3/uL (ref 0.0–0.4)
Eos: 2 %
Hematocrit: 41.6 % (ref 37.5–51.0)
Hemoglobin: 13.8 g/dL (ref 13.0–17.7)
Immature Grans (Abs): 0 10*3/uL (ref 0.0–0.1)
Immature Granulocytes: 0 %
Lymphocytes Absolute: 1.2 10*3/uL (ref 0.7–3.1)
Lymphs: 26 %
MCH: 30.9 pg (ref 26.6–33.0)
MCHC: 33.2 g/dL (ref 31.5–35.7)
MCV: 93 fL (ref 79–97)
Monocytes Absolute: 0.5 10*3/uL (ref 0.1–0.9)
Monocytes: 10 %
Neutrophils Absolute: 2.9 10*3/uL (ref 1.4–7.0)
Neutrophils: 61 %
Platelets: 175 10*3/uL (ref 150–450)
RBC: 4.46 x10E6/uL (ref 4.14–5.80)
RDW: 13 % (ref 11.6–15.4)
WBC: 4.7 10*3/uL (ref 3.4–10.8)

## 2023-01-16 LAB — LIPID PANEL W/O CHOL/HDL RATIO
Cholesterol, Total: 121 mg/dL (ref 100–199)
HDL: 48 mg/dL (ref >39)
LDL Chol Calc (NIH): 58 mg/dL (ref 0–99)
Triglycerides: 71 mg/dL (ref 0–149)
VLDL Cholesterol Cal: 15 mg/dL (ref 5–40)

## 2023-01-16 LAB — COMPREHENSIVE METABOLIC PANEL
ALT: 13 IU/L (ref 0–44)
AST: 16 IU/L (ref 0–40)
Albumin/Globulin Ratio: 2 (ref 1.2–2.2)
Albumin: 4.8 g/dL (ref 3.8–4.8)
Alkaline Phosphatase: 96 IU/L (ref 44–121)
BUN/Creatinine Ratio: 22 (ref 10–24)
BUN: 30 mg/dL — ABNORMAL HIGH (ref 8–27)
Bilirubin Total: 1.3 mg/dL — ABNORMAL HIGH (ref 0.0–1.2)
CO2: 25 mmol/L (ref 20–29)
Calcium: 9.8 mg/dL (ref 8.6–10.2)
Chloride: 101 mmol/L (ref 96–106)
Creatinine, Ser: 1.36 mg/dL — ABNORMAL HIGH (ref 0.76–1.27)
Globulin, Total: 2.4 g/dL (ref 1.5–4.5)
Glucose: 95 mg/dL (ref 70–99)
Potassium: 4.1 mmol/L (ref 3.5–5.2)
Sodium: 140 mmol/L (ref 134–144)
Total Protein: 7.2 g/dL (ref 6.0–8.5)
eGFR: 54 mL/min/{1.73_m2} — ABNORMAL LOW (ref >59)

## 2023-01-16 LAB — HGB A1C W/O EAG: Hgb A1c MFr Bld: 6.3 % — ABNORMAL HIGH (ref 4.8–5.6)

## 2023-01-16 LAB — PSA: Prostate Specific Ag, Serum: 2.1 ng/mL (ref 0.0–4.0)

## 2023-01-16 LAB — MICROALBUMIN / CREATININE URINE RATIO
Creatinine, Urine: 103.3 mg/dL
Microalb/Creat Ratio: 5 mg/g creat (ref 0–29)
Microalbumin, Urine: 4.7 ug/mL

## 2023-01-29 NOTE — Progress Notes (Signed)
Remote pacemaker transmission.   

## 2023-03-25 ENCOUNTER — Telehealth: Payer: Self-pay

## 2023-03-25 ENCOUNTER — Other Ambulatory Visit: Payer: Self-pay | Admitting: Internal Medicine

## 2023-03-25 DIAGNOSIS — H11132 Conjunctival pigmentations, left eye: Secondary | ICD-10-CM

## 2023-03-25 NOTE — Telephone Encounter (Signed)
Patient needs medication refills of Sildenafil

## 2023-03-25 NOTE — Progress Notes (Signed)
Received recommendations from [facility] to refer for OS conjunctival melanosis.

## 2023-03-26 ENCOUNTER — Other Ambulatory Visit: Payer: Self-pay

## 2023-03-26 ENCOUNTER — Other Ambulatory Visit: Payer: Self-pay | Admitting: Internal Medicine

## 2023-03-26 NOTE — Telephone Encounter (Signed)
Jacqlyn Larsen sent RX

## 2023-04-08 ENCOUNTER — Ambulatory Visit (INDEPENDENT_AMBULATORY_CARE_PROVIDER_SITE_OTHER): Payer: Self-pay

## 2023-04-08 DIAGNOSIS — I447 Left bundle-branch block, unspecified: Secondary | ICD-10-CM

## 2023-04-08 LAB — CUP PACEART REMOTE DEVICE CHECK
Battery Remaining Longevity: 12 mo
Battery Remaining Percentage: 16 %
Battery Voltage: 2.87 V
Brady Statistic AP VP Percent: 83 %
Brady Statistic AP VS Percent: 0 %
Brady Statistic AS VP Percent: 1 %
Brady Statistic AS VS Percent: 1 %
Brady Statistic RA Percent Paced: 72 %
Date Time Interrogation Session: 20240821020011
Implantable Lead Connection Status: 753985
Implantable Lead Connection Status: 753985
Implantable Lead Connection Status: 753985
Implantable Lead Implant Date: 20191126
Implantable Lead Implant Date: 20191126
Implantable Lead Implant Date: 20191126
Implantable Lead Location: 753858
Implantable Lead Location: 753860
Implantable Lead Location: 753860
Implantable Lead Model: 3830
Implantable Pulse Generator Implant Date: 20191126
Lead Channel Impedance Value: 460 Ohm
Lead Channel Impedance Value: 540 Ohm
Lead Channel Impedance Value: 600 Ohm
Lead Channel Pacing Threshold Amplitude: 0.75 V
Lead Channel Pacing Threshold Amplitude: 0.75 V
Lead Channel Pacing Threshold Amplitude: 1 V
Lead Channel Pacing Threshold Pulse Width: 0.5 ms
Lead Channel Pacing Threshold Pulse Width: 0.5 ms
Lead Channel Pacing Threshold Pulse Width: 1 ms
Lead Channel Sensing Intrinsic Amplitude: 1 mV
Lead Channel Sensing Intrinsic Amplitude: 9.7 mV
Lead Channel Setting Pacing Amplitude: 2 V
Lead Channel Setting Pacing Amplitude: 2 V
Lead Channel Setting Pacing Amplitude: 2.5 V
Lead Channel Setting Pacing Pulse Width: 0.5 ms
Lead Channel Setting Pacing Pulse Width: 0.5 ms
Lead Channel Setting Sensing Sensitivity: 0.7 mV
Pulse Gen Model: 3562
Pulse Gen Serial Number: 9060242

## 2023-04-22 NOTE — Progress Notes (Signed)
Remote pacemaker transmission.   

## 2023-05-08 ENCOUNTER — Ambulatory Visit: Payer: Self-pay | Admitting: Internal Medicine

## 2023-05-08 DIAGNOSIS — Z23 Encounter for immunization: Secondary | ICD-10-CM

## 2023-07-02 ENCOUNTER — Other Ambulatory Visit: Payer: Self-pay

## 2023-07-02 MED ORDER — CARVEDILOL 6.25 MG PO TABS: ORAL_TABLET | ORAL | 11 refills | Status: DC

## 2023-07-02 MED ORDER — LOSARTAN POTASSIUM 100 MG PO TABS: ORAL_TABLET | ORAL | 11 refills | Status: DC

## 2023-07-02 MED ORDER — EMPAGLIFLOZIN 10 MG PO TABS: 10.0000 mg | ORAL_TABLET | Freq: Every day | ORAL | 11 refills | Status: DC

## 2023-07-02 MED ORDER — ATORVASTATIN CALCIUM 80 MG PO TABS: ORAL_TABLET | ORAL | 11 refills | Status: DC

## 2023-07-02 MED ORDER — FUROSEMIDE 40 MG PO TABS: ORAL_TABLET | ORAL | 3 refills | Status: DC

## 2023-07-08 ENCOUNTER — Ambulatory Visit: Payer: Self-pay

## 2023-07-08 DIAGNOSIS — I5022 Chronic systolic (congestive) heart failure: Secondary | ICD-10-CM

## 2023-07-08 DIAGNOSIS — I447 Left bundle-branch block, unspecified: Secondary | ICD-10-CM

## 2023-07-08 LAB — CUP PACEART REMOTE DEVICE CHECK
Battery Remaining Longevity: 9 mo
Battery Remaining Percentage: 12 %
Battery Voltage: 2.86 V
Brady Statistic AP VP Percent: 84 %
Brady Statistic AP VS Percent: 0 %
Brady Statistic AS VP Percent: 1 %
Brady Statistic AS VS Percent: 1 %
Brady Statistic RA Percent Paced: 74 %
Date Time Interrogation Session: 20241120020008
Implantable Lead Connection Status: 753985
Implantable Lead Connection Status: 753985
Implantable Lead Connection Status: 753985
Implantable Lead Implant Date: 20191126
Implantable Lead Implant Date: 20191126
Implantable Lead Implant Date: 20191126
Implantable Lead Location: 753858
Implantable Lead Location: 753860
Implantable Lead Location: 753860
Implantable Lead Model: 3830
Implantable Pulse Generator Implant Date: 20191126
Lead Channel Impedance Value: 440 Ohm
Lead Channel Impedance Value: 540 Ohm
Lead Channel Impedance Value: 600 Ohm
Lead Channel Pacing Threshold Amplitude: 0.75 V
Lead Channel Pacing Threshold Amplitude: 0.75 V
Lead Channel Pacing Threshold Amplitude: 1 V
Lead Channel Pacing Threshold Pulse Width: 0.5 ms
Lead Channel Pacing Threshold Pulse Width: 0.5 ms
Lead Channel Pacing Threshold Pulse Width: 1 ms
Lead Channel Sensing Intrinsic Amplitude: 1 mV
Lead Channel Sensing Intrinsic Amplitude: 9.9 mV
Lead Channel Setting Pacing Amplitude: 2 V
Lead Channel Setting Pacing Amplitude: 2 V
Lead Channel Setting Pacing Amplitude: 2.5 V
Lead Channel Setting Pacing Pulse Width: 0.5 ms
Lead Channel Setting Pacing Pulse Width: 0.5 ms
Lead Channel Setting Sensing Sensitivity: 0.7 mV
Pulse Gen Model: 3562
Pulse Gen Serial Number: 9060242

## 2023-07-20 ENCOUNTER — Other Ambulatory Visit: Payer: Self-pay

## 2023-07-20 DIAGNOSIS — R7989 Other specified abnormal findings of blood chemistry: Secondary | ICD-10-CM

## 2023-07-20 DIAGNOSIS — E1122 Type 2 diabetes mellitus with diabetic chronic kidney disease: Secondary | ICD-10-CM

## 2023-07-21 LAB — BASIC METABOLIC PANEL
BUN/Creatinine Ratio: 23 (ref 10–24)
BUN: 36 mg/dL — ABNORMAL HIGH (ref 8–27)
CO2: 23 mmol/L (ref 20–29)
Calcium: 9.2 mg/dL (ref 8.6–10.2)
Chloride: 108 mmol/L — ABNORMAL HIGH (ref 96–106)
Creatinine, Ser: 1.54 mg/dL — ABNORMAL HIGH (ref 0.76–1.27)
Glucose: 113 mg/dL — ABNORMAL HIGH (ref 70–99)
Potassium: 4.1 mmol/L (ref 3.5–5.2)
Sodium: 148 mmol/L — ABNORMAL HIGH (ref 134–144)
eGFR: 46 mL/min/{1.73_m2} — ABNORMAL LOW (ref >59)

## 2023-07-21 LAB — HEMOGLOBIN A1C
Est. average glucose Bld gHb Est-mCnc: 137 mg/dL
Hgb A1c MFr Bld: 6.4 % — ABNORMAL HIGH (ref 4.8–5.6)

## 2023-07-22 ENCOUNTER — Encounter: Payer: Self-pay | Admitting: Internal Medicine

## 2023-07-22 ENCOUNTER — Ambulatory Visit: Payer: Self-pay | Admitting: Internal Medicine

## 2023-07-22 ENCOUNTER — Other Ambulatory Visit: Payer: Self-pay

## 2023-07-22 VITALS — BP 112/80 | HR 80 | Resp 16 | Ht 75.0 in | Wt 253.0 lb

## 2023-07-22 DIAGNOSIS — I4821 Permanent atrial fibrillation: Secondary | ICD-10-CM

## 2023-07-22 DIAGNOSIS — H269 Unspecified cataract: Secondary | ICD-10-CM

## 2023-07-22 DIAGNOSIS — E785 Hyperlipidemia, unspecified: Secondary | ICD-10-CM

## 2023-07-22 DIAGNOSIS — I10 Essential (primary) hypertension: Secondary | ICD-10-CM

## 2023-07-22 DIAGNOSIS — I429 Cardiomyopathy, unspecified: Secondary | ICD-10-CM

## 2023-07-22 DIAGNOSIS — E1122 Type 2 diabetes mellitus with diabetic chronic kidney disease: Secondary | ICD-10-CM

## 2023-07-22 DIAGNOSIS — N183 Chronic kidney disease, stage 3 unspecified: Secondary | ICD-10-CM

## 2023-07-22 DIAGNOSIS — R7989 Other specified abnormal findings of blood chemistry: Secondary | ICD-10-CM

## 2023-07-22 MED ORDER — RIVAROXABAN 20 MG PO TABS: 20.0000 mg | ORAL_TABLET | Freq: Every day | ORAL | 11 refills | Status: DC

## 2023-07-22 NOTE — Progress Notes (Signed)
Subjective:    Patient ID: Kyle Burke, male   DOB: 06/12/1946, 77 y.o.   MRN: [id number]   HPI  Amparo Bristol interprets   DM:  A1C is 6.4%.  Taking Jardiance without difficulty.  States he is eating well and staying physically active.  Was doing a lot of yard work.  Had eye check yesterday with [facility].  Has cataracts.  Planning surgery.    2.  Elevated creatinine:  Up a bit to 1.54  3.  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  No dyspnea or chest pain. Taking Xarelto, losartan, carvedilol,   4.  Dyslipidemia:  At goal as usual in May of this year with CPE.  Atorvastatin   5.  Posterior pharyngeal drainage:  has not tried the Claritin he uses as needed.    6.  HM:  has been to dentist and plans for a tooth to be removed.  He needs a COVID booster.  7.  Toenail onychomycosis:  only used penlac for one bottle and did not refill.  Discussed use until nails normal  Current Meds  Medication Sig   acetaminophen (TYLENOL) 500 MG tablet Take 500 mg by mouth every 8 (eight) hours as needed.   AgaMatrix Ultra-Thin Lancets MISC Check blood glucose twice daily before meals   atorvastatin (LIPITOR) 80 MG tablet TAKE 1 TABLET (80MG  TOTAL) BY MOUTH ONCE A DAY   Blood Glucose Monitoring Suppl (AGAMATRIX PRESTO PRO METER) DEVI Twice daily blood glucose checks before meals   carvedilol (COREG) 6.25 MG tablet TAKE 1 AND 1/2 TABLETS (9.375MG ) BY MOUTH 2 TIMES A DAY WITH A MEAL   empagliflozin (JARDIANCE) 10 MG TABS tablet Take 1 tablet (10 mg total) by mouth daily before breakfast.   ferrous gluconate (FERGON) 324 MG tablet Take 1 tablet (324 mg total) by mouth daily with breakfast.   furosemide (LASIX) 40 MG tablet TAKE 1.5 TABLETS BY MOUTH EVERY MORNING AND 1 TABLET EVERY EVENING   glucose blood (AGAMATRIX PRESTO TEST) test strip Twice daily glucose checks before meals   Glycerin-Hypromellose-PEG 400 0.2-0.2-1 % SOLN Place 2 drops into both  eyes daily as needed (redness releif).   losartan (COZAAR) 100 MG tablet TAKE 1/2 TABLET (50MG  TOTAL) BY MOUTH AT BEDTIME   rivaroxaban (XARELTO) 20 MG TABS tablet Take 1 tablet (20 mg total) by mouth at bedtime.   sildenafil (VIAGRA) 100 MG tablet TAKE .5 TO 1 TABLET BY MOUTH ONE HALF HOUR BEFOR ACTIVITY ONLY 1 TABLET MAX IN 24 HOURS     Allergies  Allergen Reactions   Spironolactone Other (See Comments)    Gynecomastia   Other     REFUSE BLOOD PRODUCTS     Review of Systems    Objective:   BP 112/80 (BP Location: Left Arm, Patient Position: Sitting, Cuff Size: Normal)   Pulse 80   Resp 16   Ht 6\' 3"  (1.905 m)   Wt 253 lb (114.8 kg)   BMI 31.62 kg/m   Physical Exam HENT:     Head: Normocephalic and atraumatic.     Right Ear: Tympanic membrane normal.     Left Ear: Tympanic membrane normal.     Nose: Congestion present.     Mouth/Throat:     Mouth: Mucous membranes are moist.     Pharynx: Oropharynx is clear.  Eyes:     Extraocular Movements: Extraocular movements intact.     Conjunctiva/sclera: Conjunctivae normal.     Pupils:  Pupils are equal, round, and reactive to light.  Neck:     Thyroid: No thyroid mass or thyromegaly.  Cardiovascular:     Rate and Rhythm: Normal rate and regular rhythm.     Heart sounds: S1 normal and S2 normal. No murmur heard.    No friction rub. No S3 or S4 sounds.     Comments: No carotid bruits.  Carotid, radial, femoral, DP and PT pulses normal and equal.   Pulmonary:     Effort: Pulmonary effort is normal.     Breath sounds: Normal breath sounds.  Abdominal:     General: Bowel sounds are normal.     Palpations: Abdomen is soft. There is no mass.     Tenderness: There is no abdominal tenderness.     Hernia: No hernia is present.  Musculoskeletal:     Cervical back: Normal range of motion and neck supple.     Right lower leg: No edema.     Left lower leg: No edema.  Feet:     Right foot:     Toenail Condition: Fungal  disease present.    Left foot:     Toenail Condition: Fungal disease present. Skin:    General: Skin is warm.     Capillary Refill: Capillary refill takes less than 2 seconds.  Neurological:     Mental Status: He is alert.      Assessment & Plan   DM:  adequately controlled with lifestyle and Jardiance  2.  Elevated Creatinine:  continue to monitor.  Up a bit with this check.  3.  Cardiomyopathy with afib and biventricular pacemaker:  Compensated and stable  4.  Hypertension:  controlled  5.  Allergies:  to try his Loratadine and see if control drainage.    6.  Dental and cataract concerns:  as per dental and ophthalmology.  7.  Toenail onychomycosis:  He will restart penlac and use until clearance of toenails  8.  Dyslipidemia:  controlled

## 2023-08-04 NOTE — Progress Notes (Signed)
Remote pacemaker transmission.   

## 2023-09-08 DIAGNOSIS — H269 Unspecified cataract: Secondary | ICD-10-CM | POA: Insufficient documentation

## 2023-09-08 DIAGNOSIS — I429 Cardiomyopathy, unspecified: Secondary | ICD-10-CM | POA: Insufficient documentation

## 2023-10-07 ENCOUNTER — Ambulatory Visit (INDEPENDENT_AMBULATORY_CARE_PROVIDER_SITE_OTHER): Payer: Self-pay

## 2023-10-07 DIAGNOSIS — I447 Left bundle-branch block, unspecified: Secondary | ICD-10-CM

## 2023-10-08 LAB — CUP PACEART REMOTE DEVICE CHECK
Battery Remaining Longevity: 7 mo
Battery Remaining Percentage: 9 %
Battery Voltage: 2.84 V
Brady Statistic AP VP Percent: 85 %
Brady Statistic AP VS Percent: 0 %
Brady Statistic AS VP Percent: 1 %
Brady Statistic AS VS Percent: 1 %
Brady Statistic RA Percent Paced: 75 %
Date Time Interrogation Session: 20250219020013
Implantable Lead Connection Status: 753985
Implantable Lead Connection Status: 753985
Implantable Lead Connection Status: 753985
Implantable Lead Implant Date: 20191126
Implantable Lead Implant Date: 20191126
Implantable Lead Implant Date: 20191126
Implantable Lead Location: 753858
Implantable Lead Location: 753860
Implantable Lead Location: 753860
Implantable Lead Model: 3830
Implantable Pulse Generator Implant Date: 20191126
Lead Channel Impedance Value: 440 Ohm
Lead Channel Impedance Value: 460 Ohm
Lead Channel Impedance Value: 600 Ohm
Lead Channel Pacing Threshold Amplitude: 0.75 V
Lead Channel Pacing Threshold Amplitude: 0.875 V
Lead Channel Pacing Threshold Amplitude: 1 V
Lead Channel Pacing Threshold Pulse Width: 0.5 ms
Lead Channel Pacing Threshold Pulse Width: 0.5 ms
Lead Channel Pacing Threshold Pulse Width: 1 ms
Lead Channel Sensing Intrinsic Amplitude: 1 mV
Lead Channel Sensing Intrinsic Amplitude: 9.7 mV
Lead Channel Setting Pacing Amplitude: 2 V
Lead Channel Setting Pacing Amplitude: 2 V
Lead Channel Setting Pacing Amplitude: 2.5 V
Lead Channel Setting Pacing Pulse Width: 0.5 ms
Lead Channel Setting Pacing Pulse Width: 0.5 ms
Lead Channel Setting Sensing Sensitivity: 0.7 mV
Pulse Gen Model: 3562
Pulse Gen Serial Number: 9060242

## 2023-10-22 ENCOUNTER — Ambulatory Visit: Payer: Self-pay | Attending: Internal Medicine | Admitting: Internal Medicine

## 2023-10-22 ENCOUNTER — Encounter: Payer: Self-pay | Admitting: Internal Medicine

## 2023-10-22 VITALS — BP 126/62 | HR 75 | Ht 74.0 in | Wt 255.8 lb

## 2023-10-22 DIAGNOSIS — I4821 Permanent atrial fibrillation: Secondary | ICD-10-CM

## 2023-10-22 DIAGNOSIS — I5022 Chronic systolic (congestive) heart failure: Secondary | ICD-10-CM

## 2023-10-22 LAB — CUP PACEART INCLINIC DEVICE CHECK
Battery Remaining Longevity: 6 mo
Battery Voltage: 2.83 V
Brady Statistic RA Percent Paced: 75 %
Brady Statistic RV Percent Paced: 85 %
Date Time Interrogation Session: 20250306165038
Implantable Lead Connection Status: 753985
Implantable Lead Connection Status: 753985
Implantable Lead Connection Status: 753985
Implantable Lead Implant Date: 20191126
Implantable Lead Implant Date: 20191126
Implantable Lead Implant Date: 20191126
Implantable Lead Location: 753858
Implantable Lead Location: 753860
Implantable Lead Location: 753860
Implantable Lead Model: 3830
Implantable Pulse Generator Implant Date: 20191126
Lead Channel Impedance Value: 437.5 Ohm
Lead Channel Impedance Value: 462.5 Ohm
Lead Channel Impedance Value: 662.5 Ohm
Lead Channel Pacing Threshold Amplitude: 0.75 V
Lead Channel Pacing Threshold Amplitude: 0.75 V
Lead Channel Pacing Threshold Amplitude: 0.75 V
Lead Channel Pacing Threshold Amplitude: 0.75 V
Lead Channel Pacing Threshold Amplitude: 0.75 V
Lead Channel Pacing Threshold Amplitude: 0.75 V
Lead Channel Pacing Threshold Pulse Width: 0.5 ms
Lead Channel Pacing Threshold Pulse Width: 0.5 ms
Lead Channel Pacing Threshold Pulse Width: 0.5 ms
Lead Channel Pacing Threshold Pulse Width: 0.5 ms
Lead Channel Pacing Threshold Pulse Width: 1 ms
Lead Channel Pacing Threshold Pulse Width: 1 ms
Lead Channel Sensing Intrinsic Amplitude: 0.7 mV
Lead Channel Sensing Intrinsic Amplitude: 9.7 mV
Lead Channel Setting Pacing Amplitude: 2 V
Lead Channel Setting Pacing Amplitude: 2 V
Lead Channel Setting Pacing Amplitude: 2.5 V
Lead Channel Setting Pacing Pulse Width: 0.5 ms
Lead Channel Setting Pacing Pulse Width: 0.5 ms
Lead Channel Setting Sensing Sensitivity: 0.7 mV
Pulse Gen Model: 3562
Pulse Gen Serial Number: 9060242

## 2023-10-22 NOTE — Patient Instructions (Addendum)
 Medication Instructions:  Your physician recommends that you continue on your current medications as directed. Please refer to the Current Medication list given to you today.  *If you need a refill on your cardiac medications before your next appointment, please call your pharmacy*  Lab Work: None ordered. If you have labs (blood work) drawn today and your tests are completely normal, you will receive your results only by: MyChart Message (if you have MyChart)  If you have any lab test that is abnormal or we need to change your treatment, we will call you or send a MyChart message to review the results.  Testing/Procedures: None ordered.  Follow-Up: At [facility], you and your health needs are our priority.  As part of our continuing mission to provide you with exceptional heart care, we have created designated Provider Care Teams.  These Care Teams include your primary Cardiologist (physician) and Advanced Practice Providers (APPs -  Physician Assistants and Nurse Practitioners) who all work together to provide you with the care you need, when you need it.  Your next appointment:   Will schedule procedure at [facility]  The format for your next appointment:   In Person  Provider:   Lewayne Bunting, MD{or one of the following Advanced Practice Providers on your designated Care Team:   Francis Dowse, [place] Casimiro Needle "Mardelle Matte" [place], [place] Earnest Rosier, NP  Note: Remote monitoring is used to monitor your Pacemaker/ ICD from home. This monitoring reduces the number of office visits required to check your device to one time per year. It allows [place] to keep an eye on the functioning of your device to ensure it is working properly.            Valet parking services will be available as well.

## 2023-10-22 NOTE — Progress Notes (Signed)
 HPI [name] returns today for followup. He has a h/o chronic systolic heart failure, LBBB, chronic atrial fib, and is s/p Biv PPM insertion. He received a His bundle lead in the atrial port, an LV and RV leads and his EF has improved from 15% to 25%. He has class 2 symptoms. He has not had syncope and denies peripheral edema, chest pain or sob.   Allergies  Allergen Reactions   Spironolactone Other (See Comments)    Gynecomastia   Other     REFUSE BLOOD PRODUCTS     Current Outpatient Medications  Medication Sig Dispense Refill   acetaminophen (TYLENOL) 500 MG tablet Take 500 mg by mouth every 8 (eight) hours as needed.     AgaMatrix Ultra-Thin Lancets MISC Check blood glucose twice daily before meals 100 each 11   atorvastatin (LIPITOR) 80 MG tablet TAKE 1 TABLET (80MG  TOTAL) BY MOUTH ONCE A DAY 30 tablet 11   Blood Glucose Monitoring Suppl (AGAMATRIX PRESTO PRO METER) DEVI Twice daily blood glucose checks before meals 1 Device 0   carvedilol (COREG) 6.25 MG tablet TAKE 1 AND 1/2 TABLETS (9.375MG ) BY MOUTH 2 TIMES A DAY WITH A MEAL 90 tablet 11   empagliflozin (JARDIANCE) 10 MG TABS tablet Take 1 tablet (10 mg total) by mouth daily before breakfast. 30 tablet 11   ferrous gluconate (FERGON) 324 MG tablet Take 1 tablet (324 mg total) by mouth daily with breakfast.  3   furosemide (LASIX) 40 MG tablet TAKE 1.5 TABLETS BY MOUTH EVERY MORNING AND 1 TABLET EVERY EVENING 225 tablet 3   glucose blood (AGAMATRIX PRESTO TEST) test strip Twice daily glucose checks before meals 100 each 12   Glycerin-Hypromellose-PEG 400 0.2-0.2-1 % SOLN Place 2 drops into both eyes daily as needed (redness releif).     losartan (COZAAR) 100 MG tablet TAKE 1/2 TABLET (50MG  TOTAL) BY MOUTH AT BEDTIME 15 tablet 11   rivaroxaban (XARELTO) 20 MG TABS tablet Take 1 tablet (20 mg total) by mouth at bedtime. 30 tablet 11   sildenafil (VIAGRA) 100 MG tablet TAKE .5 TO 1 TABLET BY MOUTH ONE HALF HOUR BEFOR  ACTIVITY ONLY 1 TABLET MAX IN 24 HOURS 30 tablet 0   ciclopirox (PENLAC) 8 % solution Apply topically at bedtime. Apply over nail and surrounding skin. Apply daily over previous coat. After seven (7) days, may remove with alcohol and continue cycle. (Patient not taking: Reported on 10/22/2023) 6.6 mL 11   No current facility-administered medications for this visit.     Past Medical History:  Diagnosis Date   Atrial fibrillation (HCC) 2016   CAD (coronary artery disease)    High cholesterol    Hypertension 2006   Idiopathic cardiomyopathy (HCC) 11/01/2014   Followed by Dr. Elease Hashimoto, Cardiology  EF 25%  Adenosine Cardiolite did not support ischemic etiology   OA (osteoarthritis) of knee 01/08/2017   Pleural effusion    Presence of permanent cardiac pacemaker 07/13/2018   Refusal of blood transfusions as patient is Jehovah's Witness    Type II diabetes mellitus (HCC) 2001    ROS:   All systems reviewed and negative except as noted in the HPI.   Past Surgical History:  Procedure Laterality Date   BI-VENTRICULAR PACEMAKER INSERTION (CRT-P)  07/13/2018   BIV PACEMAKER INSERTION CRT-P N/A 07/13/2018   Procedure: BIV PACEMAKER INSERTION CRT-P;  Surgeon: Marinus Maw, MD;  Location: [facility] INVASIVE CV LAB;  Service: Cardiovascular;  Laterality: N/A;  COLONOSCOPY WITH PROPOFOL N/A 05/14/2016   Procedure: COLONOSCOPY WITH PROPOFOL;  Surgeon: Beverley Fiedler, MD;  Location: [facility] ENDOSCOPY;  Service: Gastroenterology;  Laterality: N/A;   COLONOSCOPY WITH PROPOFOL N/A 07/09/2020   Procedure: COLONOSCOPY WITH PROPOFOL;  Surgeon: Beverley Fiedler, MD;  Location: [facility] ENDOS[facility];  Service: Gastroenterology;  Laterality: N/A;   ESOPHAGOGASTRODUODENOSCOPY (EGD) WITH PROPOFOL N/A 05/14/2016   Procedure: ESOPHAGOGASTRODUODENOSCOPY (EGD) WITH PROPOFOL;  Surgeon: Beverley Fiedler, MD;  Location: [facility] ENDOS[facility];  Service: Gastroenterology;  Laterality: N/A;   HEMOSTASIS CLIP PLACEMENT  07/09/2020   Procedure: HEMOSTASIS  CLIP PLACEMENT;  Surgeon: Beverley Fiedler, MD;  Location: [facility];  Service: Gastroenterology;;   INCISE AND DRAIN ABCESS Left 1986   on leg   INGUINAL HERNIA REPAIR  01/16/2012   Procedure: LAPAROSCOPIC INGUINAL HERNIA;  Surgeon: Atilano Ina, MD,FACS;  Location: [facility];  Service: General;  Laterality: Left;  Laparoscopic incarcerated Left inguinal hernia repair with mesh, umbilical hernia repair   INGUINAL HERNIA REPAIR Left 06/20/2021   Procedure: OPEN REPAIR OF RECURENT LEFT INCARCERATED INGUINAL HERNIA WITH MESH;  Surgeon: Gaynelle Adu, MD;  Location: [facility];  Service: General;  Laterality: Left;   POLYPECTOMY  07/09/2020   Procedure: POLYPECTOMY;  Surgeon: Beverley Fiedler, MD;  Location: [facility];  Service: Gastroenterology;;  hot snare   UMBILICAL HERNIA REPAIR  01/16/2012   Procedure: HERNIA REPAIR UMBILICAL ADULT;  Surgeon: Atilano Ina, MD,FACS;  Location: [facility];  Service: General;  Laterality: N/A;     Family History  Problem Relation Age of Onset   Stroke Mother    Heart disease Mother    Heart disease Father    Diabetes Father    Kidney disease Father        , history of stones as well   Cancer Sister 30       unknown soft tissue cancer of leg   Depression Sister    Pneumonia Brother 71       COVID   Diabetes Brother    COPD Brother    Diabetes Brother    Diabetes Daughter    Varicose Veins Son    Diabetes Son        prediabetes   Varicose Veins Son      Social History   Socioeconomic History   Marital status: Married    Spouse name: Kyle Burke   Number of children: 5   Years of education: 9   Highest education level: Not on file  Occupational History   Occupation: Curator constrution-retired    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  Tobacco Use   Smoking status: Never    Passive exposure: Never   Smokeless tobacco: Never  Vaping Use   Vaping status: Never Used  Substance and Sexual Activity   Alcohol use: Not Currently     Alcohol/week: 0.0 standard drinks of alcohol    Comment: 07/13/2018 "used to drink; none since 2015"   Drug use: Never   Sexual activity: Yes    Comment: Viagra helps a bit.  Other Topics Concern   Not on file  Social History Narrative   [place]   Lives at home with wife, a son, Jiles Garter, and his girlfriend   Social Drivers of Corporate investment banker Strain: Low Risk  (01/15/2023)   Overall Financial Resource Strain (CARDIA)    Difficulty of Paying Living Expenses: Not very hard  Food Insecurity: No Food Insecurity (01/15/2023)  Hunger Vital Sign    Worried About Running Out of Food in the Last Year: Never true    Ran Out of Food in the Last Year: Never true  Transportation Needs: No Transportation Needs (01/15/2023)   PRAPARE - Administrator, Civil Service (Medical): No    Lack of Transportation (Non-Medical): No  Physical Activity: Not on file  Stress: Not on file  Social Connections: Not on file  Intimate Partner Violence: Not At Risk (01/15/2023)   Humiliation, Afraid, Rape, and Kick questionnaire    Fear of Current or Ex-Partner: No    Emotionally Abused: No    Physically Abused: No    Sexually Abused: No     BP 126/62   Pulse 75   Ht 6\' 2"  (1.88 m)   Wt 255 lb 12.8 oz (116 kg)   SpO2 96%   BMI 32.84 kg/m   Physical Exam:  Well appearing NAD HEENT: Unremarkable Neck:  No JVD, no thyromegally Lymphatics:  No adenopathy Back:  No CVA tenderness Lungs:  Clear with no wheezes HEART:  Regular rate rhythm, no murmurs, no rubs, no clicks Abd:  soft, positive bowel sounds, no organomegally, no rebound, no guarding Ext:  2 plus pulses, no edema, no cyanosis, no clubbing Skin:  No rashes no nodules Neuro:  CN II through XII intact, motor grossly intact  EKG - afib with biv pacing  DEVICE  Normal device function.  See PaceArt for details.   Assess/Plan: 1. Chronic atrial fib - he is well controlled after PPM insertion 2. PPM - his  biv PPM is working normally.  3. Chronic systolic heart failure - his symptoms remain class 2. He will follow. 4. Dyslipidemia - he will continue lipitor. No change.   Sharlot Gowda Ginia Rudell,MD

## 2023-11-11 NOTE — Addendum Note (Signed)
 Addended by: Elease Etienne A on: 11/11/2023 03:00 PM   Modules accepted: Orders

## 2023-11-11 NOTE — Progress Notes (Signed)
 Remote pacemaker transmission.

## 2023-11-23 ENCOUNTER — Ambulatory Visit (INDEPENDENT_AMBULATORY_CARE_PROVIDER_SITE_OTHER): Payer: Self-pay

## 2023-11-23 DIAGNOSIS — I5022 Chronic systolic (congestive) heart failure: Secondary | ICD-10-CM

## 2023-11-25 LAB — CUP PACEART REMOTE DEVICE CHECK
Battery Remaining Longevity: 6 mo
Battery Remaining Percentage: 8 %
Battery Voltage: 2.83 V
Brady Statistic AP VP Percent: 87 %
Brady Statistic AP VS Percent: 0 %
Brady Statistic AS VP Percent: 1 %
Brady Statistic AS VS Percent: 1 %
Brady Statistic RA Percent Paced: 78 %
Date Time Interrogation Session: 20250407020009
Implantable Lead Connection Status: 753985
Implantable Lead Connection Status: 753985
Implantable Lead Connection Status: 753985
Implantable Lead Implant Date: 20191126
Implantable Lead Implant Date: 20191126
Implantable Lead Implant Date: 20191126
Implantable Lead Location: 753858
Implantable Lead Location: 753860
Implantable Lead Location: 753860
Implantable Lead Model: 3830
Implantable Pulse Generator Implant Date: 20191126
Lead Channel Impedance Value: 410 Ohm
Lead Channel Impedance Value: 540 Ohm
Lead Channel Impedance Value: 600 Ohm
Lead Channel Pacing Threshold Amplitude: 0.625 V
Lead Channel Pacing Threshold Amplitude: 0.75 V
Lead Channel Pacing Threshold Amplitude: 0.75 V
Lead Channel Pacing Threshold Pulse Width: 0.5 ms
Lead Channel Pacing Threshold Pulse Width: 0.5 ms
Lead Channel Pacing Threshold Pulse Width: 1 ms
Lead Channel Sensing Intrinsic Amplitude: 0.7 mV
Lead Channel Sensing Intrinsic Amplitude: 9.1 mV
Lead Channel Setting Pacing Amplitude: 2 V
Lead Channel Setting Pacing Amplitude: 2 V
Lead Channel Setting Pacing Amplitude: 2.5 V
Lead Channel Setting Pacing Pulse Width: 0.5 ms
Lead Channel Setting Pacing Pulse Width: 0.5 ms
Lead Channel Setting Sensing Sensitivity: 0.7 mV
Pulse Gen Model: 3562
Pulse Gen Serial Number: 9060242

## 2023-12-21 ENCOUNTER — Ambulatory Visit (INDEPENDENT_AMBULATORY_CARE_PROVIDER_SITE_OTHER): Payer: Self-pay

## 2023-12-21 DIAGNOSIS — I5022 Chronic systolic (congestive) heart failure: Secondary | ICD-10-CM

## 2023-12-21 LAB — CUP PACEART REMOTE DEVICE CHECK
Battery Remaining Longevity: 6 mo
Battery Remaining Percentage: 7 %
Battery Voltage: 2.81 V
Brady Statistic AP VP Percent: 87 %
Brady Statistic AP VS Percent: 0 %
Brady Statistic AS VP Percent: 1 %
Brady Statistic AS VS Percent: 1 %
Brady Statistic RA Percent Paced: 78 %
Date Time Interrogation Session: 20250505020009
Implantable Lead Connection Status: 753985
Implantable Lead Connection Status: 753985
Implantable Lead Connection Status: 753985
Implantable Lead Implant Date: 20191126
Implantable Lead Implant Date: 20191126
Implantable Lead Implant Date: 20191126
Implantable Lead Location: 753858
Implantable Lead Location: 753860
Implantable Lead Location: 753860
Implantable Lead Model: 3830
Implantable Pulse Generator Implant Date: 20191126
Lead Channel Impedance Value: 440 Ohm
Lead Channel Impedance Value: 460 Ohm
Lead Channel Impedance Value: 660 Ohm
Lead Channel Pacing Threshold Amplitude: 0.75 V
Lead Channel Pacing Threshold Amplitude: 0.75 V
Lead Channel Pacing Threshold Amplitude: 0.875 V
Lead Channel Pacing Threshold Pulse Width: 0.5 ms
Lead Channel Pacing Threshold Pulse Width: 0.5 ms
Lead Channel Pacing Threshold Pulse Width: 1 ms
Lead Channel Sensing Intrinsic Amplitude: 0.7 mV
Lead Channel Sensing Intrinsic Amplitude: 9.7 mV
Lead Channel Setting Pacing Amplitude: 2 V
Lead Channel Setting Pacing Amplitude: 2 V
Lead Channel Setting Pacing Amplitude: 2.5 V
Lead Channel Setting Pacing Pulse Width: 0.5 ms
Lead Channel Setting Pacing Pulse Width: 0.5 ms
Lead Channel Setting Sensing Sensitivity: 0.7 mV
Pulse Gen Model: 3562
Pulse Gen Serial Number: 9060242

## 2024-01-12 NOTE — Progress Notes (Signed)
 Remote pacemaker transmission.

## 2024-01-12 NOTE — Addendum Note (Signed)
 Addended by: Edra Govern D on: 01/12/2024 11:28 AM   Modules accepted: Orders, Level of Service

## 2024-01-15 ENCOUNTER — Other Ambulatory Visit: Payer: Self-pay

## 2024-01-15 DIAGNOSIS — Z0000 Encounter for general adult medical examination without abnormal findings: Secondary | ICD-10-CM

## 2024-01-16 LAB — COMPREHENSIVE METABOLIC PANEL WITH GFR
ALT: 16 IU/L (ref 0–44)
AST: 15 IU/L (ref 0–40)
Albumin: 4.3 g/dL (ref 3.8–4.8)
Alkaline Phosphatase: 92 IU/L (ref 44–121)
BUN/Creatinine Ratio: 20 (ref 10–24)
BUN: 29 mg/dL — ABNORMAL HIGH (ref 8–27)
Bilirubin Total: 1 mg/dL (ref 0.0–1.2)
CO2: 24 mmol/L (ref 20–29)
Calcium: 9.1 mg/dL (ref 8.6–10.2)
Chloride: 102 mmol/L (ref 96–106)
Creatinine, Ser: 1.44 mg/dL — ABNORMAL HIGH (ref 0.76–1.27)
Globulin, Total: 2.7 g/dL (ref 1.5–4.5)
Glucose: 100 mg/dL — ABNORMAL HIGH (ref 70–99)
Potassium: 4.3 mmol/L (ref 3.5–5.2)
Sodium: 142 mmol/L (ref 134–144)
Total Protein: 7 g/dL (ref 6.0–8.5)
eGFR: 50 mL/min/{1.73_m2} — ABNORMAL LOW (ref >59)

## 2024-01-16 LAB — CBC WITH DIFFERENTIAL/PLATELET
Basophils Absolute: 0 10*3/uL (ref 0.0–0.2)
Basos: 1 %
EOS (ABSOLUTE): 0.1 10*3/uL (ref 0.0–0.4)
Eos: 2 %
Hematocrit: 39.9 % (ref 37.5–51.0)
Hemoglobin: 12.7 g/dL — ABNORMAL LOW (ref 13.0–17.7)
Immature Grans (Abs): 0 10*3/uL (ref 0.0–0.1)
Immature Granulocytes: 0 %
Lymphocytes Absolute: 1.4 10*3/uL (ref 0.7–3.1)
Lymphs: 31 %
MCH: 30.6 pg (ref 26.6–33.0)
MCHC: 31.8 g/dL (ref 31.5–35.7)
MCV: 96 fL (ref 79–97)
Monocytes Absolute: 0.5 10*3/uL (ref 0.1–0.9)
Monocytes: 10 %
Neutrophils Absolute: 2.6 10*3/uL (ref 1.4–7.0)
Neutrophils: 56 %
Platelets: 166 10*3/uL (ref 150–450)
RBC: 4.15 x10E6/uL (ref 4.14–5.80)
RDW: 13.1 % (ref 11.6–15.4)
WBC: 4.6 10*3/uL (ref 3.4–10.8)

## 2024-01-16 LAB — HEMOGLOBIN A1C
Est. average glucose Bld gHb Est-mCnc: 128 mg/dL
Hgb A1c MFr Bld: 6.1 % — ABNORMAL HIGH (ref 4.8–5.6)

## 2024-01-16 LAB — MICROALBUMIN / CREATININE URINE RATIO
Creatinine, Urine: 86.2 mg/dL
Microalb/Creat Ratio: 4 mg/g{creat} (ref 0–29)
Microalbumin, Urine: 3.7 ug/mL

## 2024-01-16 LAB — LIPID PANEL W/O CHOL/HDL RATIO
Cholesterol, Total: 102 mg/dL (ref 100–199)
HDL: 37 mg/dL — ABNORMAL LOW (ref >39)
LDL Chol Calc (NIH): 49 mg/dL (ref 0–99)
Triglycerides: 75 mg/dL (ref 0–149)
VLDL Cholesterol Cal: 16 mg/dL (ref 5–40)

## 2024-01-18 ENCOUNTER — Encounter: Payer: Self-pay | Admitting: Internal Medicine

## 2024-01-18 ENCOUNTER — Ambulatory Visit (INDEPENDENT_AMBULATORY_CARE_PROVIDER_SITE_OTHER): Payer: Self-pay

## 2024-01-18 ENCOUNTER — Ambulatory Visit: Payer: Self-pay | Admitting: Internal Medicine

## 2024-01-18 VITALS — BP 118/60 | HR 70 | Resp 16 | Ht 75.0 in | Wt 253.0 lb

## 2024-01-18 DIAGNOSIS — Z0000 Encounter for general adult medical examination without abnormal findings: Secondary | ICD-10-CM

## 2024-01-18 DIAGNOSIS — N183 Chronic kidney disease, stage 3 unspecified: Secondary | ICD-10-CM

## 2024-01-18 DIAGNOSIS — I5022 Chronic systolic (congestive) heart failure: Secondary | ICD-10-CM

## 2024-01-18 DIAGNOSIS — I10 Essential (primary) hypertension: Secondary | ICD-10-CM

## 2024-01-18 DIAGNOSIS — E785 Hyperlipidemia, unspecified: Secondary | ICD-10-CM

## 2024-01-18 DIAGNOSIS — I429 Cardiomyopathy, unspecified: Secondary | ICD-10-CM

## 2024-01-18 DIAGNOSIS — N189 Chronic kidney disease, unspecified: Secondary | ICD-10-CM

## 2024-01-18 DIAGNOSIS — E1122 Type 2 diabetes mellitus with diabetic chronic kidney disease: Secondary | ICD-10-CM

## 2024-01-18 DIAGNOSIS — D649 Anemia, unspecified: Secondary | ICD-10-CM

## 2024-01-18 LAB — CUP PACEART REMOTE DEVICE CHECK
Battery Remaining Longevity: 5 mo
Battery Remaining Percentage: 7 %
Battery Voltage: 2.81 V
Brady Statistic AP VP Percent: 88 %
Brady Statistic AP VS Percent: 0 %
Brady Statistic AS VP Percent: 1 %
Brady Statistic AS VS Percent: 1 %
Brady Statistic RA Percent Paced: 79 %
Date Time Interrogation Session: 20250602020008
Implantable Lead Connection Status: 753985
Implantable Lead Connection Status: 753985
Implantable Lead Connection Status: 753985
Implantable Lead Implant Date: 20191126
Implantable Lead Implant Date: 20191126
Implantable Lead Implant Date: 20191126
Implantable Lead Location: 753858
Implantable Lead Location: 753860
Implantable Lead Location: 753860
Implantable Lead Model: 3830
Implantable Pulse Generator Implant Date: 20191126
Lead Channel Impedance Value: 410 Ohm
Lead Channel Impedance Value: 460 Ohm
Lead Channel Impedance Value: 660 Ohm
Lead Channel Pacing Threshold Amplitude: 0.75 V
Lead Channel Pacing Threshold Amplitude: 0.75 V
Lead Channel Pacing Threshold Amplitude: 0.875 V
Lead Channel Pacing Threshold Pulse Width: 0.5 ms
Lead Channel Pacing Threshold Pulse Width: 0.5 ms
Lead Channel Pacing Threshold Pulse Width: 1 ms
Lead Channel Sensing Intrinsic Amplitude: 0.7 mV
Lead Channel Sensing Intrinsic Amplitude: 10.3 mV
Lead Channel Setting Pacing Amplitude: 2 V
Lead Channel Setting Pacing Amplitude: 2 V
Lead Channel Setting Pacing Amplitude: 2.5 V
Lead Channel Setting Pacing Pulse Width: 0.5 ms
Lead Channel Setting Pacing Pulse Width: 0.5 ms
Lead Channel Setting Sensing Sensitivity: 0.7 mV
Pulse Gen Model: 3562
Pulse Gen Serial Number: 9060242

## 2024-01-18 NOTE — Progress Notes (Addendum)
 Subjective:    Patient ID: Kyle Burke, male   DOB: [birth year], 78 y.o.   MRN: [id number]   HPI  Laveta Pottier interprets  Here for Male CPE:  1.  STE:  Performs once monthly.  No concerning findings.  No family history of testicular cancer.    2.  PSA:  Last check 01/15/2023 and normal at 2.1.  No family history of prostate cancer.    3.  Guaiac Cards/FIT:  Last checked in 12/2021 and negative for blood.    4.  Colonoscopy:  Repeat colonoscopy 08/2022 for history of adenomas and anemia.  Had one tubular adenoma without high grade dysplasia.  Recommended that he not repeat again.  Dr. Bridgett Camps.  He has on and off again anemia.  States his diet is good at times.    5.  Cholesterol/Glucose:  Cholesterol basically at goal, though HDL low again.  A1C at goal at 6.1% for DM on Jardiance  10 mg alone.   Lipid Panel     Component Value Date/Time   CHOL 102 01/15/2024 0922   TRIG 75 01/15/2024 0922   HDL 37 (L) 01/15/2024 0922   CHOLHDL 4.0 09/15/2014 1037   VLDL 12 09/15/2014 1037   LDLCALC 49 01/15/2024 0922   LABVLDL 16 01/15/2024 0922     6.  Immunizations:  Has not received COVID vaccine. Immunization History  Administered Date(s) Administered   Fluad Trivalent(High Dose 65+) 05/08/2023   Influenza, High Dose Seasonal PF 10/26/2015, 05/05/2022   Influenza,inj,Quad PF,6+ Mos 05/17/2017, 06/11/2018, 06/03/2019   Influenza-Unspecified 05/16/2017, 06/02/2019, 04/09/2020, 05/20/2021   Moderna Covid-19 Fall Seasonal Vaccine 71yrs & older 07/18/2022   Moderna Covid-19 Vaccine  Bivalent Booster 36yrs & up 01/08/2022   Moderna SARS-COV2 Booster Vaccination 06/25/2020, 12/12/2020   Moderna Sars-Covid-2 Vaccination 10/10/2019, 11/08/2019   Pneumococcal Conjugate-13 06/30/2019   Pneumococcal Polysaccharide-23 08/21/2016, 01/08/2022   Tdap 05/08/2016   Zoster Recombinant(Shingrix ) 07/30/2021, 05/28/2022     Current Meds  Medication Sig   acetaminophen  (TYLENOL ) 500 MG  tablet Take 500 mg by mouth every 8 (eight) hours as needed.   AgaMatrix Ultra-Thin Lancets MISC Check blood glucose twice daily before meals   atorvastatin  (LIPITOR ) 80 MG tablet TAKE 1 TABLET (80MG  TOTAL) BY MOUTH ONCE A DAY   Blood Glucose Monitoring Suppl (AGAMATRIX PRESTO PRO METER) DEVI Twice daily blood glucose checks before meals   carvedilol  (COREG ) 6.25 MG tablet TAKE 1 AND 1/2 TABLETS (9.375MG ) BY MOUTH 2 TIMES A DAY WITH A MEAL   empagliflozin  (JARDIANCE ) 10 MG TABS tablet Take 1 tablet (10 mg total) by mouth daily before breakfast.   ferrous gluconate  (FERGON) 324 MG tablet Take 1 tablet (324 mg total) by mouth daily with breakfast.   furosemide  (LASIX ) 40 MG tablet TAKE 1.5 TABLETS BY MOUTH EVERY MORNING AND 1 TABLET EVERY EVENING   glucose blood (AGAMATRIX PRESTO TEST) test strip Twice daily glucose checks before meals   Glycerin-Hypromellose-PEG 400 0.2-0.2-1 % SOLN Place 2 drops into both eyes daily as needed (redness releif).   losartan  (COZAAR ) 100 MG tablet TAKE 1/2 TABLET (50MG  TOTAL) BY MOUTH AT BEDTIME   rivaroxaban  (XARELTO ) 20 MG TABS tablet Take 1 tablet (20 mg total) by mouth at bedtime.   sildenafil  (VIAGRA ) 100 MG tablet TAKE .5 TO 1 TABLET BY MOUTH ONE HALF HOUR BEFOR ACTIVITY ONLY 1 TABLET MAX IN 24 HOURS   Allergies  Allergen Reactions   Spironolactone  Other (See Comments)    Gynecomastia   Other  REFUSE BLOOD PRODUCTS   Past Medical History:  Diagnosis Date   Atrial fibrillation (HCC) 2016   Bilateral cataracts    CAD (coronary artery disease)    High cholesterol    Hypertension 2006   Idiopathic cardiomyopathy (HCC) 11/01/2014   Followed by Dr. Alroy Aspen, Cardiology  EF 25%  Adenosine  Cardiolite  did not support ischemic etiology   OA (osteoarthritis) of knee 01/08/2017   Pleural effusion    Presence of permanent cardiac pacemaker 07/13/2018   Refusal of blood transfusions as patient is Jehovah's Witness    Type II diabetes mellitus (HCC) 2001    Past Surgical History:  Procedure Laterality Date   BI-VENTRICULAR PACEMAKER INSERTION (CRT-P)  07/13/2018   BIV PACEMAKER INSERTION CRT-P N/A 07/13/2018   Procedure: BIV PACEMAKER INSERTION CRT-P;  Surgeon: Tammie Fall, MD;  Location: [facility];  Service: Cardiovascular;  Laterality: N/A;   CATARACT EXTRACTION W/ INTRAOCULAR LENS IMPLANT Bilateral    Second surgery 10/2023   COLONOSCOPY WITH PROPOFOL  N/A 05/14/2016   Procedure: COLONOSCOPY WITH PROPOFOL ;  Surgeon: Nannette Babe, MD;  Location: [facility];  Service: Gastroenterology;  Laterality: N/A;   COLONOSCOPY WITH PROPOFOL  N/A 07/09/2020   Procedure: COLONOSCOPY WITH PROPOFOL ;  Surgeon: Nannette Babe, MD;  Location: [facility];  Service: Gastroenterology;  Laterality: N/A;   ESOPHAGOGASTRODUODENOSCOPY (EGD) WITH PROPOFOL  N/A 05/14/2016   Procedure: ESOPHAGOGASTRODUODENOSCOPY (EGD) WITH PROPOFOL ;  Surgeon: Nannette Babe, MD;  Location: [facility];  Service: Gastroenterology;  Laterality: N/A;   HEMOSTASIS CLIP PLACEMENT  07/09/2020   Procedure: HEMOSTASIS CLIP PLACEMENT;  Surgeon: Nannette Babe, MD;  Location: [facility];  Service: Gastroenterology;;   INCISE AND DRAIN ABCESS Left 1986   on leg   INGUINAL HERNIA REPAIR  01/16/2012   Procedure: LAPAROSCOPIC INGUINAL HERNIA;  Surgeon: Fran Imus, MD,FACS;  Location: [facility];  Service: General;  Laterality: Left;  Laparoscopic incarcerated Left inguinal hernia repair with mesh, umbilical hernia repair   INGUINAL HERNIA REPAIR Left 06/20/2021   Procedure: OPEN REPAIR OF RECURENT LEFT INCARCERATED INGUINAL HERNIA WITH MESH;  Surgeon: Aldean Hummingbird, MD;  Location: [facility];  Service: General;  Laterality: Left;   POLYPECTOMY  07/09/2020   Procedure: POLYPECTOMY;  Surgeon: Nannette Babe, MD;  Location: [facility];  Service: Gastroenterology;;  hot snare   UMBILICAL HERNIA REPAIR  01/16/2012   Procedure: HERNIA REPAIR UMBILICAL ADULT;  Surgeon: Fran Imus, MD,FACS;   Location: [facility];  Service: General;  Laterality: N/A;   Family History  Problem Relation Age of Onset   Stroke Mother    Heart disease Mother    Heart disease Father    Diabetes Father    Kidney disease Father        , history of stones as well   Cancer Sister 52       unknown soft tissue cancer of leg   Depression Sister    Pneumonia Brother 40       COVID   Diabetes Brother    COPD Brother    Diabetes Brother    Diabetes Daughter    Varicose Veins Son    Diabetes Son        prediabetes   Varicose Veins Son    Social History   Socioeconomic History   Marital status: Married    Spouse name: Shelagh Derrick   Number of children: 5   Years of education: 9   Highest education level: Not on file  Occupational History   Occupation:  Concrete constrution-retired    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  Tobacco Use   Smoking status: Never    Passive exposure: Never   Smokeless tobacco: Never  Vaping Use   Vaping status: Never Used  Substance and Sexual Activity   Alcohol use: Not Currently    Alcohol/week: 0.0 standard drinks of alcohol    Comment: 07/13/2018 "used to drink; none since 2015"   Drug use: Never   Sexual activity: Yes    Comment: Viagra  helps a bit.  Other Topics Concern   Not on file  Social History Narrative   Lives at home with wife, a son, Ellan Gunner, and his wife.   Social Drivers of Corporate investment banker Strain: Low Risk  (01/18/2024)   Overall Financial Resource Strain (CARDIA)    Difficulty of Paying Living Expenses: Not very hard  Food Insecurity: No Food Insecurity (01/18/2024)   Hunger Vital Sign    Worried About Running Out of Food in the Last Year: Never true    Ran Out of Food in the Last Year: Never true  Transportation Needs: No Transportation Needs (01/18/2024)   PRAPARE - Administrator, Civil Service (Medical): No    Lack of Transportation (Non-Medical): No  Physical Activity: Not on file  Stress: Not on  file  Social Connections: Not on file  Intimate Partner Violence: Not At Risk (01/18/2024)   Humiliation, Afraid, Rape, and Kick questionnaire    Fear of Current or Ex-Partner: No    Emotionally Abused: No    Physically Abused: No    Sexually Abused: No      Review of Systems  Eyes:  Negative for visual disturbance (Following with Dr. Piedad Brewer at [facility]).  Respiratory:  Negative for shortness of breath.   Cardiovascular:  Negative for chest pain, palpitations and leg swelling.  Gastrointestinal:  Negative for abdominal pain and blood in stool (No melena.).  Neurological:  Negative for weakness and numbness.      Objective:   BP 118/60 (BP Location: Left Arm, Patient Position: Sitting, Cuff Size: Normal)   Pulse 70   Resp 16   Ht 6\' 3"  (1.905 m)   Wt 253 lb (114.8 kg)   SpO2 96%   BMI 31.62 kg/m   Physical Exam HENT:     Head: Normocephalic and atraumatic.     Right Ear: Tympanic membrane, ear canal and external ear normal.     Left Ear: Tympanic membrane, ear canal and external ear normal.     Nose: Nose normal.     Mouth/Throat:     Mouth: Mucous membranes are moist.     Pharynx: Oropharynx is clear.  Eyes:     Extraocular Movements: Extraocular movements intact.     Conjunctiva/sclera: Conjunctivae normal.     Pupils: Pupils are equal, round, and reactive to light.     Comments: Discs sharp  Neck:     Thyroid : No thyroid  mass or thyromegaly.  Cardiovascular:     Rate and Rhythm: Normal rate and regular rhythm.     Pulses:          Dorsalis pedis pulses are 2+ on the right side and 2+ on the left side.       Posterior tibial pulses are 2+ on the right side and 2+ on the left side.     Heart sounds: S1 normal and S2 normal. No murmur heard.    No friction rub. No S3 or  S4 sounds.     Comments: Trace edema of LE with extensive varicosities. Pulmonary:     Effort: Pulmonary effort is normal.     Breath sounds: Normal breath sounds and air entry.  Abdominal:      General: Bowel sounds are normal.     Palpations: Abdomen is soft. There is no hepatomegaly, splenomegaly or mass.     Tenderness: There is no abdominal tenderness.     Hernia: No hernia is present. There is no hernia in the left inguinal area or right inguinal area.  Genitourinary:    Penis: Normal and uncircumcised.      Testes:        Right: Mass or tenderness not present. Right testis is descended.        Left: Mass or tenderness not present. Left testis is descended.  Musculoskeletal:        General: Normal range of motion.     Cervical back: Normal range of motion and neck supple.     Comments: Signficant varus deformities of knees.    Feet:     Right foot:     Protective Sensation: 10 sites tested.  10 sites sensed.     Skin integrity: Skin integrity normal.     Toenail Condition: Right toenails are normal.     Left foot:     Protective Sensation: 10 sites tested.  10 sites sensed.     Skin integrity: Skin integrity normal.     Toenail Condition: Left toenails are normal.  Lymphadenopathy:     Head:     Right side of head: No submental or submandibular adenopathy.     Left side of head: No submental or submandibular adenopathy.     Cervical: No cervical adenopathy.     Upper Body:     Right upper body: No supraclavicular adenopathy.     Left upper body: No supraclavicular adenopathy.     Lower Body: No right inguinal adenopathy. No left inguinal adenopathy.  Skin:    General: Skin is warm.     Findings: Lesion (Seborrheic keratoses on back--right side) present. No rash.  Neurological:     General: No focal deficit present.     Mental Status: He is alert and oriented to person, place, and time.     Cranial Nerves: Cranial nerves 2-12 are intact.     Sensory: Sensation is intact.     Motor: Motor function is intact.     Coordination: Coordination is intact.     Gait: Gait is intact.     Deep Tendon Reflexes: Reflexes are normal and symmetric.  Psychiatric:         Mood and Affect: Mood normal.        Speech: Speech normal.        Behavior: Behavior normal. Behavior is cooperative.      Assessment & Plan   CPE No further colonoscopies  Recommend COVID 2024-2025--we are out currently Recommend COVID and influenza vaccines in the fall.  2.  CKD:  stable.  Jardiance   3.  DM:  at goal with control  4.  Dyslipidemia:  HDL only part not at goal.  He will increase physical activity to daily.  5.  Mild normocytic anemia:  have not been able to establish etiology.  Has had extensive GI work up in past and multiple colonoscopies, the last in 08/2022 and one non dysplastice adenoma. He does have CKD as well.  6.  Bilateral cataracts:  both extracted  in past year.    7.  Cardiomyopathy with afib:  has biventricular pacemaker, which has improved cardiac function significantly.  Battery change for pacemaker planned for August of this year per patient.  8.  Hypertension:  controlled.  9.  Dental:  has appt later in early fall.

## 2024-01-21 ENCOUNTER — Ambulatory Visit: Payer: Self-pay | Admitting: Internal Medicine

## 2024-02-08 NOTE — Progress Notes (Signed)
 Remote pacemaker transmission.

## 2024-02-15 ENCOUNTER — Ambulatory Visit (INDEPENDENT_AMBULATORY_CARE_PROVIDER_SITE_OTHER): Payer: Self-pay

## 2024-02-15 ENCOUNTER — Ambulatory Visit: Payer: Self-pay | Admitting: Internal Medicine

## 2024-02-15 DIAGNOSIS — I5022 Chronic systolic (congestive) heart failure: Secondary | ICD-10-CM

## 2024-02-15 LAB — CUP PACEART REMOTE DEVICE CHECK
Battery Remaining Longevity: 5 mo
Battery Remaining Percentage: 6 %
Battery Voltage: 2.8 V
Brady Statistic AP VP Percent: 88 %
Brady Statistic AP VS Percent: 0 %
Brady Statistic AS VP Percent: 1 %
Brady Statistic AS VS Percent: 1 %
Brady Statistic RA Percent Paced: 79 %
Date Time Interrogation Session: 20250630020010
Implantable Lead Connection Status: 753985
Implantable Lead Connection Status: 753985
Implantable Lead Connection Status: 753985
Implantable Lead Implant Date: 20191126
Implantable Lead Implant Date: 20191126
Implantable Lead Implant Date: 20191126
Implantable Lead Location: 753858
Implantable Lead Location: 753860
Implantable Lead Location: 753860
Implantable Lead Model: 3830
Implantable Pulse Generator Implant Date: 20191126
Lead Channel Impedance Value: 440 Ohm
Lead Channel Impedance Value: 510 Ohm
Lead Channel Impedance Value: 660 Ohm
Lead Channel Pacing Threshold Amplitude: 0.75 V
Lead Channel Pacing Threshold Amplitude: 0.75 V
Lead Channel Pacing Threshold Amplitude: 0.75 V
Lead Channel Pacing Threshold Pulse Width: 0.5 ms
Lead Channel Pacing Threshold Pulse Width: 0.5 ms
Lead Channel Pacing Threshold Pulse Width: 1 ms
Lead Channel Sensing Intrinsic Amplitude: 0.7 mV
Lead Channel Sensing Intrinsic Amplitude: 10.5 mV
Lead Channel Setting Pacing Amplitude: 2 V
Lead Channel Setting Pacing Amplitude: 2 V
Lead Channel Setting Pacing Amplitude: 2.5 V
Lead Channel Setting Pacing Pulse Width: 0.5 ms
Lead Channel Setting Pacing Pulse Width: 0.5 ms
Lead Channel Setting Sensing Sensitivity: 0.7 mV
Pulse Gen Model: 3562
Pulse Gen Serial Number: 9060242

## 2024-03-08 NOTE — Progress Notes (Signed)
 Remote pacemaker transmission.

## 2024-03-14 ENCOUNTER — Ambulatory Visit: Payer: Self-pay

## 2024-03-15 LAB — CUP PACEART REMOTE DEVICE CHECK
Battery Remaining Longevity: 4 mo
Battery Remaining Percentage: 6 %
Battery Voltage: 2.78 V
Brady Statistic AP VP Percent: 87 %
Brady Statistic AP VS Percent: 0 %
Brady Statistic AS VP Percent: 1 %
Brady Statistic AS VS Percent: 1 %
Brady Statistic RA Percent Paced: 79 %
Date Time Interrogation Session: 20250728020007
Implantable Lead Connection Status: 753985
Implantable Lead Connection Status: 753985
Implantable Lead Connection Status: 753985
Implantable Lead Implant Date: 20191126
Implantable Lead Implant Date: 20191126
Implantable Lead Implant Date: 20191126
Implantable Lead Location: 753858
Implantable Lead Location: 753860
Implantable Lead Location: 753860
Implantable Lead Model: 3830
Implantable Pulse Generator Implant Date: 20191126
Lead Channel Impedance Value: 410 Ohm
Lead Channel Impedance Value: 530 Ohm
Lead Channel Impedance Value: 560 Ohm
Lead Channel Pacing Threshold Amplitude: 0.75 V
Lead Channel Pacing Threshold Amplitude: 0.75 V
Lead Channel Pacing Threshold Amplitude: 0.875 V
Lead Channel Pacing Threshold Pulse Width: 0.5 ms
Lead Channel Pacing Threshold Pulse Width: 0.5 ms
Lead Channel Pacing Threshold Pulse Width: 1 ms
Lead Channel Sensing Intrinsic Amplitude: 0.7 mV
Lead Channel Sensing Intrinsic Amplitude: 10.2 mV
Lead Channel Setting Pacing Amplitude: 2 V
Lead Channel Setting Pacing Amplitude: 2 V
Lead Channel Setting Pacing Amplitude: 2.5 V
Lead Channel Setting Pacing Pulse Width: 0.5 ms
Lead Channel Setting Pacing Pulse Width: 0.5 ms
Lead Channel Setting Sensing Sensitivity: 0.7 mV
Pulse Gen Model: 3562
Pulse Gen Serial Number: 9060242

## 2024-03-18 ENCOUNTER — Ambulatory Visit: Payer: Self-pay | Admitting: Internal Medicine

## 2024-04-11 ENCOUNTER — Ambulatory Visit: Payer: Self-pay

## 2024-04-12 LAB — CUP PACEART REMOTE DEVICE CHECK
Battery Remaining Longevity: 4 mo
Battery Remaining Percentage: 5 %
Battery Voltage: 2.77 V
Brady Statistic AP VP Percent: 87 %
Brady Statistic AP VS Percent: 0 %
Brady Statistic AS VP Percent: 1 %
Brady Statistic AS VS Percent: 1 %
Brady Statistic RA Percent Paced: 79 %
Date Time Interrogation Session: 20250825020009
Implantable Lead Connection Status: 753985
Implantable Lead Connection Status: 753985
Implantable Lead Connection Status: 753985
Implantable Lead Implant Date: 20191126
Implantable Lead Implant Date: 20191126
Implantable Lead Implant Date: 20191126
Implantable Lead Location: 753858
Implantable Lead Location: 753860
Implantable Lead Location: 753860
Implantable Lead Model: 3830
Implantable Pulse Generator Implant Date: 20191126
Lead Channel Impedance Value: 410 Ohm
Lead Channel Impedance Value: 460 Ohm
Lead Channel Impedance Value: 600 Ohm
Lead Channel Pacing Threshold Amplitude: 0.75 V
Lead Channel Pacing Threshold Amplitude: 0.75 V
Lead Channel Pacing Threshold Amplitude: 0.75 V
Lead Channel Pacing Threshold Pulse Width: 0.5 ms
Lead Channel Pacing Threshold Pulse Width: 0.5 ms
Lead Channel Pacing Threshold Pulse Width: 1 ms
Lead Channel Sensing Intrinsic Amplitude: 0.7 mV
Lead Channel Sensing Intrinsic Amplitude: 10.1 mV
Lead Channel Setting Pacing Amplitude: 2 V
Lead Channel Setting Pacing Amplitude: 2 V
Lead Channel Setting Pacing Amplitude: 2.5 V
Lead Channel Setting Pacing Pulse Width: 0.5 ms
Lead Channel Setting Pacing Pulse Width: 0.5 ms
Lead Channel Setting Sensing Sensitivity: 0.7 mV
Pulse Gen Model: 3562
Pulse Gen Serial Number: 9060242

## 2024-04-13 ENCOUNTER — Ambulatory Visit: Payer: Self-pay | Admitting: Internal Medicine

## 2024-05-02 ENCOUNTER — Other Ambulatory Visit: Payer: Self-pay

## 2024-05-02 ENCOUNTER — Telehealth: Payer: Self-pay | Admitting: Internal Medicine

## 2024-05-02 MED ORDER — FUROSEMIDE 40 MG PO TABS: ORAL_TABLET | ORAL | 3 refills | Status: AC

## 2024-05-02 NOTE — Telephone Encounter (Signed)
Pt informed

## 2024-05-02 NOTE — Telephone Encounter (Signed)
 Patient is requesting medication  for Furosemide  (LASIX  ) 40 MG tablet -to default pharmacy. Patient would like to be informed after medications refill is sent.

## 2024-05-02 NOTE — Telephone Encounter (Signed)
 Rx Sent

## 2024-05-09 ENCOUNTER — Ambulatory Visit (INDEPENDENT_AMBULATORY_CARE_PROVIDER_SITE_OTHER): Payer: Self-pay

## 2024-05-09 DIAGNOSIS — I5022 Chronic systolic (congestive) heart failure: Secondary | ICD-10-CM

## 2024-05-09 LAB — CUP PACEART REMOTE DEVICE CHECK
Battery Remaining Longevity: 3 mo
Battery Remaining Percentage: 4 %
Battery Voltage: 2.74 V
Brady Statistic AP VP Percent: 88 %
Brady Statistic AP VS Percent: 0 %
Brady Statistic AS VP Percent: 1 %
Brady Statistic AS VS Percent: 1 %
Brady Statistic RA Percent Paced: 80 %
Date Time Interrogation Session: 20250922020012
Implantable Lead Connection Status: 753985
Implantable Lead Connection Status: 753985
Implantable Lead Connection Status: 753985
Implantable Lead Implant Date: 20191126
Implantable Lead Implant Date: 20191126
Implantable Lead Implant Date: 20191126
Implantable Lead Location: 753858
Implantable Lead Location: 753860
Implantable Lead Location: 753860
Implantable Lead Model: 3830
Implantable Pulse Generator Implant Date: 20191126
Lead Channel Impedance Value: 440 Ohm
Lead Channel Impedance Value: 540 Ohm
Lead Channel Impedance Value: 660 Ohm
Lead Channel Pacing Threshold Amplitude: 0.75 V
Lead Channel Pacing Threshold Amplitude: 0.75 V
Lead Channel Pacing Threshold Amplitude: 0.875 V
Lead Channel Pacing Threshold Pulse Width: 0.5 ms
Lead Channel Pacing Threshold Pulse Width: 0.5 ms
Lead Channel Pacing Threshold Pulse Width: 1 ms
Lead Channel Sensing Intrinsic Amplitude: 0.7 mV
Lead Channel Sensing Intrinsic Amplitude: 10.3 mV
Lead Channel Setting Pacing Amplitude: 2 V
Lead Channel Setting Pacing Amplitude: 2 V
Lead Channel Setting Pacing Amplitude: 2.5 V
Lead Channel Setting Pacing Pulse Width: 0.5 ms
Lead Channel Setting Pacing Pulse Width: 0.5 ms
Lead Channel Setting Sensing Sensitivity: 0.7 mV
Pulse Gen Model: 3562
Pulse Gen Serial Number: 9060242

## 2024-05-10 NOTE — Progress Notes (Signed)
 Remote PPM Transmission

## 2024-05-14 ENCOUNTER — Ambulatory Visit: Payer: Self-pay | Admitting: Internal Medicine

## 2024-05-17 ENCOUNTER — Ambulatory Visit (INDEPENDENT_AMBULATORY_CARE_PROVIDER_SITE_OTHER): Payer: Self-pay

## 2024-05-17 DIAGNOSIS — Z23 Encounter for immunization: Secondary | ICD-10-CM

## 2024-05-27 ENCOUNTER — Telehealth: Payer: Self-pay | Admitting: Internal Medicine

## 2024-05-27 ENCOUNTER — Other Ambulatory Visit: Payer: Self-pay | Admitting: Internal Medicine

## 2024-05-27 ENCOUNTER — Other Ambulatory Visit: Payer: Self-pay

## 2024-05-27 MED ORDER — SILDENAFIL CITRATE 100 MG PO TABS: ORAL_TABLET | ORAL | 11 refills | Status: AC

## 2024-05-27 NOTE — Telephone Encounter (Signed)
 Patient has been notified

## 2024-05-27 NOTE — Telephone Encounter (Signed)
 Rx sent.

## 2024-05-27 NOTE — Telephone Encounter (Signed)
 Patient called to request refills for medication  sildenafil  (VIAGRA ) 100 MG tablet [[id number]]   To be sent to pharmacy:   [facility] [address] ([address]), [place] - [address] [place] [place] ([place]) [place] [address] Phone: [phone]  Fax: [phone]

## 2024-06-09 LAB — CUP PACEART REMOTE DEVICE CHECK
Battery Remaining Longevity: 1 mo
Battery Remaining Percentage: 3 %
Battery Voltage: 2.71 V
Brady Statistic AP VP Percent: 88 %
Brady Statistic AP VS Percent: 0 %
Brady Statistic AS VP Percent: 1 %
Brady Statistic AS VS Percent: 1 %
Brady Statistic RA Percent Paced: 80 %
Date Time Interrogation Session: 20251023040021
Implantable Lead Connection Status: 753985
Implantable Lead Connection Status: 753985
Implantable Lead Connection Status: 753985
Implantable Lead Implant Date: 20191126
Implantable Lead Implant Date: 20191126
Implantable Lead Implant Date: 20191126
Implantable Lead Location: 753858
Implantable Lead Location: 753860
Implantable Lead Location: 753860
Implantable Lead Model: 3830
Implantable Pulse Generator Implant Date: 20191126
Lead Channel Impedance Value: 440 Ohm
Lead Channel Impedance Value: 460 Ohm
Lead Channel Impedance Value: 630 Ohm
Lead Channel Pacing Threshold Amplitude: 0.75 V
Lead Channel Pacing Threshold Amplitude: 0.75 V
Lead Channel Pacing Threshold Amplitude: 0.875 V
Lead Channel Pacing Threshold Pulse Width: 0.5 ms
Lead Channel Pacing Threshold Pulse Width: 0.5 ms
Lead Channel Pacing Threshold Pulse Width: 1 ms
Lead Channel Sensing Intrinsic Amplitude: 0.7 mV
Lead Channel Sensing Intrinsic Amplitude: 9.6 mV
Lead Channel Setting Pacing Amplitude: 2 V
Lead Channel Setting Pacing Amplitude: 2 V
Lead Channel Setting Pacing Amplitude: 2.5 V
Lead Channel Setting Pacing Pulse Width: 0.5 ms
Lead Channel Setting Pacing Pulse Width: 0.5 ms
Lead Channel Setting Sensing Sensitivity: 0.7 mV
Pulse Gen Model: 3562
Pulse Gen Serial Number: 9060242

## 2024-06-10 ENCOUNTER — Ambulatory Visit: Payer: Self-pay | Admitting: Internal Medicine

## 2024-06-15 NOTE — Progress Notes (Signed)
 Remote pacemaker transmission.

## 2024-06-24 ENCOUNTER — Other Ambulatory Visit: Payer: Self-pay | Admitting: Internal Medicine

## 2024-06-28 ENCOUNTER — Telehealth: Payer: Self-pay | Admitting: Internal Medicine

## 2024-06-28 NOTE — Telephone Encounter (Signed)
 Patient called and requested refills for medication   Carvedilol  and Atorvastatin .   Notified patient medication has been sent today to pharmacy    [facility] - [place], [place] - [address] [place] [address] Phone: [phone]  Fax: [phone]

## 2024-07-10 ENCOUNTER — Ambulatory Visit: Payer: Self-pay | Attending: Internal Medicine

## 2024-07-11 ENCOUNTER — Ambulatory Visit: Payer: Self-pay

## 2024-07-11 LAB — CUP PACEART REMOTE DEVICE CHECK
Battery Remaining Longevity: 1 mo
Battery Remaining Percentage: 1 %
Battery Voltage: 2.66 V
Brady Statistic AP VP Percent: 89 %
Brady Statistic AP VS Percent: 0 %
Brady Statistic AS VP Percent: 1 %
Brady Statistic AS VS Percent: 1 %
Brady Statistic RA Percent Paced: 81 %
Date Time Interrogation Session: 20251123020010
Implantable Lead Connection Status: 753985
Implantable Lead Connection Status: 753985
Implantable Lead Connection Status: 753985
Implantable Lead Implant Date: 20191126
Implantable Lead Implant Date: 20191126
Implantable Lead Implant Date: 20191126
Implantable Lead Location: 753858
Implantable Lead Location: 753860
Implantable Lead Location: 753860
Implantable Lead Model: 3830
Implantable Pulse Generator Implant Date: 20191126
Lead Channel Impedance Value: 410 Ohm
Lead Channel Impedance Value: 530 Ohm
Lead Channel Impedance Value: 660 Ohm
Lead Channel Pacing Threshold Amplitude: 0.75 V
Lead Channel Pacing Threshold Amplitude: 0.75 V
Lead Channel Pacing Threshold Amplitude: 0.75 V
Lead Channel Pacing Threshold Pulse Width: 0.5 ms
Lead Channel Pacing Threshold Pulse Width: 0.5 ms
Lead Channel Pacing Threshold Pulse Width: 1 ms
Lead Channel Sensing Intrinsic Amplitude: 0.8 mV
Lead Channel Sensing Intrinsic Amplitude: 9.5 mV
Lead Channel Setting Pacing Amplitude: 2 V
Lead Channel Setting Pacing Amplitude: 2 V
Lead Channel Setting Pacing Amplitude: 2.5 V
Lead Channel Setting Pacing Pulse Width: 0.5 ms
Lead Channel Setting Pacing Pulse Width: 0.5 ms
Lead Channel Setting Sensing Sensitivity: 0.7 mV
Pulse Gen Model: 3562
Pulse Gen Serial Number: 9060242

## 2024-07-20 ENCOUNTER — Ambulatory Visit: Payer: Self-pay | Admitting: Internal Medicine

## 2024-07-22 ENCOUNTER — Other Ambulatory Visit (INDEPENDENT_AMBULATORY_CARE_PROVIDER_SITE_OTHER): Payer: Self-pay

## 2024-07-22 DIAGNOSIS — E785 Hyperlipidemia, unspecified: Secondary | ICD-10-CM

## 2024-07-22 DIAGNOSIS — N183 Chronic kidney disease, stage 3 unspecified: Secondary | ICD-10-CM

## 2024-07-22 DIAGNOSIS — E1122 Type 2 diabetes mellitus with diabetic chronic kidney disease: Secondary | ICD-10-CM

## 2024-07-23 LAB — CBC WITH DIFFERENTIAL/PLATELET
Basophils Absolute: 0 x10E3/uL (ref 0.0–0.2)
Basos: 1 %
EOS (ABSOLUTE): 0.1 x10E3/uL (ref 0.0–0.4)
Eos: 3 %
Hematocrit: 40 % (ref 37.5–51.0)
Hemoglobin: 13 g/dL (ref 13.0–17.7)
Immature Grans (Abs): 0 x10E3/uL (ref 0.0–0.1)
Immature Granulocytes: 0 %
Lymphocytes Absolute: 1.3 x10E3/uL (ref 0.7–3.1)
Lymphs: 27 %
MCH: 31 pg (ref 26.6–33.0)
MCHC: 32.5 g/dL (ref 31.5–35.7)
MCV: 96 fL (ref 79–97)
Monocytes Absolute: 0.5 x10E3/uL (ref 0.1–0.9)
Monocytes: 10 %
Neutrophils Absolute: 2.9 x10E3/uL (ref 1.4–7.0)
Neutrophils: 59 %
Platelets: 159 x10E3/uL (ref 150–450)
RBC: 4.19 x10E6/uL (ref 4.14–5.80)
RDW: 13 % (ref 11.6–15.4)
WBC: 4.7 x10E3/uL (ref 3.4–10.8)

## 2024-07-23 LAB — HEMOGLOBIN A1C
Est. average glucose Bld gHb Est-mCnc: 128 mg/dL
Hgb A1c MFr Bld: 6.1 % — ABNORMAL HIGH (ref 4.8–5.6)

## 2024-07-23 LAB — LIPID PANEL W/O CHOL/HDL RATIO
Cholesterol, Total: 102 mg/dL (ref 100–199)
HDL: 39 mg/dL — ABNORMAL LOW (ref >39)
LDL Chol Calc (NIH): 46 mg/dL (ref 0–99)
Triglycerides: 82 mg/dL (ref 0–149)
VLDL Cholesterol Cal: 17 mg/dL (ref 5–40)

## 2024-07-26 ENCOUNTER — Encounter: Payer: Self-pay | Admitting: Internal Medicine

## 2024-07-26 ENCOUNTER — Ambulatory Visit: Payer: Self-pay | Admitting: Internal Medicine

## 2024-07-26 VITALS — BP 130/80 | HR 73 | Resp 18 | Ht 71.0 in | Wt 255.0 lb

## 2024-07-26 DIAGNOSIS — E785 Hyperlipidemia, unspecified: Secondary | ICD-10-CM

## 2024-07-26 DIAGNOSIS — I10 Essential (primary) hypertension: Secondary | ICD-10-CM

## 2024-07-26 DIAGNOSIS — N183 Chronic kidney disease, stage 3 unspecified: Secondary | ICD-10-CM

## 2024-07-26 DIAGNOSIS — I429 Cardiomyopathy, unspecified: Secondary | ICD-10-CM

## 2024-07-26 NOTE — Progress Notes (Unsigned)
    Subjective:    Patient ID: Kyle Burke, male   DOB: 1945-10-22, 78 y.o.   MRN: [id number]   HPI  Kyle Burke interprets   DM:  A1C remains at goal and stable at 6.1%.  Continues on Jardiance  without a problem.    2.  Afib with idiopathic cardiomyopathy with biventricular pacemake:   Denies dypsnea, LE edema.  No chest pain.    3.  Hyperlipidemia:  LDL at goal at 46, though HDL remains slightly low at 39.  He has not been very physically active, though has a treadmill he plans to start up soon.    4.  Mild sore throat and cough started 4 days ago.  Feels his symptoms are worsening a bit.  His son and wife were recently ill with something similar, but have improved.   No fever or dyspnea.  Cough sometimes productive of light green sputum.   Using Coricidin HBP and helps.  Current Meds  Medication Sig   acetaminophen  (TYLENOL ) 500 MG tablet Take 500 mg by mouth every 8 (eight) hours as needed. (Patient taking differently: Take 650 mg by mouth daily.)   AgaMatrix Ultra-Thin Lancets MISC Check blood glucose twice daily before meals   atorvastatin  (LIPITOR ) 80 MG tablet TAKE 1 TABLET BY MOUTH ONCE A DAY   Blood Glucose Monitoring Suppl (AGAMATRIX PRESTO PRO METER) DEVI Twice daily blood glucose checks before meals   carvedilol  (COREG ) 6.25 MG tablet TAKE 1 & 1/2 TABLETS BY MOUTH 2 TIMES A DAY WITH A MEAL   empagliflozin  (JARDIANCE ) 10 MG TABS tablet Take 1 tablet (10 mg total) by mouth daily before breakfast.   ferrous gluconate  (FERGON) 324 MG tablet Take 1 tablet (324 mg total) by mouth daily with breakfast.   furosemide  (LASIX ) 40 MG tablet TAKE 1.5 TABLETS BY MOUTH EVERY MORNING AND 1 TABLET EVERY EVENING   glucose blood (AGAMATRIX PRESTO TEST) test strip Twice daily glucose checks before meals   Glycerin-Hypromellose-PEG 400 0.2-0.2-1 % SOLN Place 2 drops into both eyes daily as needed (redness releif).   losartan  (COZAAR ) 100 MG tablet TAKE 1/2 TABLET (50MG  TOTAL) BY  MOUTH AT BEDTIME   rivaroxaban  (XARELTO ) 20 MG TABS tablet Take 1 tablet (20 mg total) by mouth at bedtime. (Patient taking differently: Take 20 mg by mouth as needed.)   sildenafil  (VIAGRA ) 100 MG tablet TAKE .5 TO 1 TABLET BY MOUTH ONE HALF HOUR BEFOR ACTIVITY ONLY 1 TABLET MAX IN 24 HOURS   Allergies  Allergen Reactions   Spironolactone  Other (See Comments)    Gynecomastia   Other     REFUSE BLOOD PRODUCTS     Review of Systems    Objective:   BP 130/80 (BP Location: Right Arm, Patient Position: Sitting, Cuff Size: Normal)   Pulse 73   Resp 18   Ht 5' 11 (1.803 m)   Wt 255 lb (115.7 kg)   BMI 35.57 kg/m   Physical Exam Trace pitting edema  Assessment & Plan  Continue coricidin HBP

## 2024-07-28 ENCOUNTER — Other Ambulatory Visit: Payer: Self-pay | Admitting: Internal Medicine

## 2024-08-03 ENCOUNTER — Ambulatory Visit: Payer: Self-pay

## 2024-08-03 ENCOUNTER — Telehealth: Payer: Self-pay | Admitting: Internal Medicine

## 2024-08-03 ENCOUNTER — Other Ambulatory Visit: Payer: Self-pay | Admitting: Internal Medicine

## 2024-08-03 DIAGNOSIS — Z23 Encounter for immunization: Secondary | ICD-10-CM

## 2024-08-03 NOTE — Telephone Encounter (Signed)
 Patient is requesting a medication refill for   losartan  (COZAAR ) 100 MG tablet [[id number]]   Please send medication to pharmacy:  [facility] - [place], [place] - [address] [place] [address] Phone: [phone]  Fax: [phone]

## 2024-08-04 MED ORDER — LOSARTAN POTASSIUM 100 MG PO TABS: ORAL_TABLET | ORAL | 11 refills | Status: AC

## 2024-08-04 NOTE — Telephone Encounter (Signed)
 Please notify him that losartan  is sent to the desired phramacy

## 2024-08-04 NOTE — Telephone Encounter (Signed)
 Patient has been notified

## 2024-08-05 ENCOUNTER — Other Ambulatory Visit: Payer: Self-pay

## 2024-08-10 ENCOUNTER — Ambulatory Visit: Payer: Self-pay

## 2024-08-10 DIAGNOSIS — I447 Left bundle-branch block, unspecified: Secondary | ICD-10-CM

## 2024-08-10 LAB — CUP PACEART REMOTE DEVICE CHECK
Battery Remaining Longevity: 1 mo
Battery Remaining Percentage: 0.5 %
Battery Voltage: 2.63 V
Brady Statistic AP VP Percent: 89 %
Brady Statistic AP VS Percent: 0 %
Brady Statistic AS VP Percent: 1 %
Brady Statistic AS VS Percent: 1 %
Brady Statistic RA Percent Paced: 81 %
Date Time Interrogation Session: 20251224020008
Implantable Lead Connection Status: 753985
Implantable Lead Connection Status: 753985
Implantable Lead Connection Status: 753985
Implantable Lead Implant Date: 20191126
Implantable Lead Implant Date: 20191126
Implantable Lead Implant Date: 20191126
Implantable Lead Location: 753858
Implantable Lead Location: 753860
Implantable Lead Location: 753860
Implantable Lead Model: 3830
Implantable Pulse Generator Implant Date: 20191126
Lead Channel Impedance Value: 440 Ohm
Lead Channel Impedance Value: 510 Ohm
Lead Channel Impedance Value: 660 Ohm
Lead Channel Pacing Threshold Amplitude: 0.75 V
Lead Channel Pacing Threshold Amplitude: 0.75 V
Lead Channel Pacing Threshold Amplitude: 0.75 V
Lead Channel Pacing Threshold Pulse Width: 0.5 ms
Lead Channel Pacing Threshold Pulse Width: 0.5 ms
Lead Channel Pacing Threshold Pulse Width: 1 ms
Lead Channel Sensing Intrinsic Amplitude: 0.8 mV
Lead Channel Sensing Intrinsic Amplitude: 11.9 mV
Lead Channel Setting Pacing Amplitude: 2 V
Lead Channel Setting Pacing Amplitude: 2 V
Lead Channel Setting Pacing Amplitude: 2.5 V
Lead Channel Setting Pacing Pulse Width: 0.5 ms
Lead Channel Setting Pacing Pulse Width: 0.5 ms
Lead Channel Setting Sensing Sensitivity: 0.7 mV
Pulse Gen Model: 3562
Pulse Gen Serial Number: 9060242

## 2024-08-11 ENCOUNTER — Ambulatory Visit: Payer: Self-pay

## 2024-08-12 ENCOUNTER — Ambulatory Visit: Payer: Self-pay | Admitting: Student in an Organized Health Care Education/Training Program

## 2024-08-12 NOTE — Progress Notes (Signed)
 Remote PPM Transmission

## 2024-08-29 ENCOUNTER — Telehealth: Payer: Self-pay

## 2024-08-29 NOTE — Telephone Encounter (Signed)
 Pt is scheduled to see Dr Almetta on 10/07/2024.

## 2024-08-29 NOTE — Telephone Encounter (Signed)
 STJ PPM: CRT-P implanted for HF.  DEVICE AT [facility]: 08/26/24 - not dependent  Alert remote transmission:  BiV pacing < limit, 88% HF diagnostics currently abnormal, recent downward trend,   Patient has not been seen since 10/2023.  Prior Dr. Waddell patient, now following Dr. Almetta.  Will need office appt and plans for gen change.

## 2024-09-08 NOTE — Telephone Encounter (Signed)
 Noted, apologize for any confusion.   As patient appears to require an interpreter, I was hoping we could combine efforts and communications to streamline the information process to avoid multiple calls around appts, instructions for procedure etc.....  Was not aware that appointment was scheduled already with Dr. Almetta.   I have contacted patient via interpreter services and spoke with his son.  Information given regarding device, general procedure discussion and appointment with directions. Also device clinic number given to call back if any further questions.

## 2024-09-10 ENCOUNTER — Ambulatory Visit: Payer: Self-pay | Attending: Student in an Organized Health Care Education/Training Program

## 2024-09-11 ENCOUNTER — Ambulatory Visit: Payer: Self-pay

## 2024-09-12 LAB — CUP PACEART REMOTE DEVICE CHECK
Battery Remaining Longevity: 0 mo
Battery Voltage: 2.6 V
Brady Statistic AP VP Percent: 87 %
Brady Statistic AP VS Percent: 0 %
Brady Statistic AS VP Percent: 1 %
Brady Statistic AS VS Percent: 1 %
Brady Statistic RA Percent Paced: 79 %
Date Time Interrogation Session: 20260124020008
Implantable Lead Connection Status: 753985
Implantable Lead Connection Status: 753985
Implantable Lead Connection Status: 753985
Implantable Lead Implant Date: 20191126
Implantable Lead Implant Date: 20191126
Implantable Lead Implant Date: 20191126
Implantable Lead Location: 753858
Implantable Lead Location: 753860
Implantable Lead Location: 753860
Implantable Lead Model: 3830
Implantable Pulse Generator Implant Date: 20191126
Lead Channel Impedance Value: 410 Ohm
Lead Channel Impedance Value: 410 Ohm
Lead Channel Impedance Value: 560 Ohm
Lead Channel Pacing Threshold Amplitude: 0.625 V
Lead Channel Pacing Threshold Amplitude: 0.75 V
Lead Channel Pacing Threshold Amplitude: 0.75 V
Lead Channel Pacing Threshold Pulse Width: 0.5 ms
Lead Channel Pacing Threshold Pulse Width: 0.5 ms
Lead Channel Pacing Threshold Pulse Width: 1 ms
Lead Channel Sensing Intrinsic Amplitude: 0.8 mV
Lead Channel Sensing Intrinsic Amplitude: 8.6 mV
Lead Channel Setting Pacing Amplitude: 2 V
Lead Channel Setting Pacing Amplitude: 2 V
Lead Channel Setting Pacing Amplitude: 2.5 V
Lead Channel Setting Pacing Pulse Width: 0.5 ms
Lead Channel Setting Pacing Pulse Width: 0.5 ms
Lead Channel Setting Sensing Sensitivity: 0.7 mV
Pulse Gen Model: 3562
Pulse Gen Serial Number: 9060242

## 2024-09-18 ENCOUNTER — Ambulatory Visit: Payer: Self-pay | Admitting: Student in an Organized Health Care Education/Training Program

## 2024-10-07 ENCOUNTER — Ambulatory Visit: Payer: Self-pay | Admitting: Student in an Organized Health Care Education/Training Program

## 2024-10-11 ENCOUNTER — Ambulatory Visit: Payer: Self-pay

## 2024-10-12 ENCOUNTER — Ambulatory Visit: Payer: Self-pay

## 2025-01-17 ENCOUNTER — Other Ambulatory Visit: Payer: Self-pay

## 2025-01-19 ENCOUNTER — Encounter: Payer: Self-pay | Admitting: Internal Medicine
